# Patient Record
Sex: Female | Born: 1955
Health system: Southern US, Community
[De-identification: ages and names within clinical notes are randomized; demographics above are authoritative.]

## PROBLEM LIST (undated history)

## (undated) DIAGNOSIS — M545 Low back pain, unspecified: Secondary | ICD-10-CM

## (undated) DIAGNOSIS — M1990 Unspecified osteoarthritis, unspecified site: Secondary | ICD-10-CM

## (undated) DIAGNOSIS — I10 Essential (primary) hypertension: Secondary | ICD-10-CM

## (undated) DIAGNOSIS — R519 Headache, unspecified: Secondary | ICD-10-CM

## (undated) DIAGNOSIS — D649 Anemia, unspecified: Secondary | ICD-10-CM

## (undated) DIAGNOSIS — F17200 Nicotine dependence, unspecified, uncomplicated: Secondary | ICD-10-CM

## (undated) DIAGNOSIS — M170 Bilateral primary osteoarthritis of knee: Secondary | ICD-10-CM

## (undated) DIAGNOSIS — E119 Type 2 diabetes mellitus without complications: Secondary | ICD-10-CM

## (undated) DIAGNOSIS — F419 Anxiety disorder, unspecified: Secondary | ICD-10-CM

## (undated) DIAGNOSIS — R51 Headache: Secondary | ICD-10-CM

## (undated) DIAGNOSIS — G709 Myoneural disorder, unspecified: Secondary | ICD-10-CM

## (undated) DIAGNOSIS — K219 Gastro-esophageal reflux disease without esophagitis: Secondary | ICD-10-CM

## (undated) DIAGNOSIS — J45909 Unspecified asthma, uncomplicated: Secondary | ICD-10-CM

## (undated) HISTORY — DX: Essential (primary) hypertension: I10

## (undated) HISTORY — DX: Unspecified asthma, uncomplicated: J45.909

## (undated) HISTORY — PX: TUBAL LIGATION: SHX77

## (undated) HISTORY — PX: HERNIA REPAIR: SHX51

## (undated) HISTORY — DX: Morbid (severe) obesity due to excess calories: E66.01

## (undated) HISTORY — DX: Bilateral primary osteoarthritis of knee: M17.0

## (undated) HISTORY — PX: ABDOMINAL HYSTERECTOMY: SHX81

## (undated) HISTORY — DX: Low back pain, unspecified: M54.50

## (undated) HISTORY — DX: Low back pain: M54.5

## (undated) HISTORY — DX: Nicotine dependence, unspecified, uncomplicated: F17.200

## (undated) HISTORY — PX: CHOLECYSTECTOMY: SHX55

---

## 2009-07-15 ENCOUNTER — Emergency Department (HOSPITAL_COMMUNITY): Admission: EM | Admit: 2009-07-15 | Discharge: 2009-07-15 | Payer: Self-pay | Admitting: Emergency Medicine

## 2009-08-03 ENCOUNTER — Encounter: Admission: RE | Admit: 2009-08-03 | Discharge: 2009-08-03 | Payer: Self-pay | Admitting: Family Medicine

## 2009-08-03 ENCOUNTER — Ambulatory Visit: Payer: Self-pay | Admitting: Family Medicine

## 2009-08-12 ENCOUNTER — Ambulatory Visit: Payer: Self-pay | Admitting: Family Medicine

## 2009-08-17 ENCOUNTER — Ambulatory Visit: Payer: Self-pay | Admitting: Family Medicine

## 2009-09-28 ENCOUNTER — Encounter
Admission: RE | Admit: 2009-09-28 | Discharge: 2009-11-16 | Payer: Self-pay | Source: Home / Self Care | Admitting: Family Medicine

## 2009-11-16 ENCOUNTER — Ambulatory Visit: Payer: Self-pay | Admitting: Family Medicine

## 2009-12-16 ENCOUNTER — Ambulatory Visit: Payer: Self-pay | Admitting: Family Medicine

## 2010-01-16 ENCOUNTER — Ambulatory Visit
Admission: RE | Admit: 2010-01-16 | Discharge: 2010-01-16 | Payer: Self-pay | Source: Home / Self Care | Attending: Family Medicine | Admitting: Family Medicine

## 2010-02-16 ENCOUNTER — Ambulatory Visit (INDEPENDENT_AMBULATORY_CARE_PROVIDER_SITE_OTHER): Payer: BC Managed Care – PPO | Admitting: Family Medicine

## 2010-02-16 DIAGNOSIS — M549 Dorsalgia, unspecified: Secondary | ICD-10-CM

## 2010-02-16 DIAGNOSIS — E669 Obesity, unspecified: Secondary | ICD-10-CM

## 2010-02-16 DIAGNOSIS — I10 Essential (primary) hypertension: Secondary | ICD-10-CM

## 2010-03-18 LAB — DIFFERENTIAL
Basophils Absolute: 0 10*3/uL (ref 0.0–0.1)
Basophils Relative: 0 % (ref 0–1)
Eosinophils Absolute: 0.1 10*3/uL (ref 0.0–0.7)
Eosinophils Relative: 1 % (ref 0–5)
Lymphs Abs: 1.7 10*3/uL (ref 0.7–4.0)
Monocytes Absolute: 0.4 10*3/uL (ref 0.1–1.0)
Monocytes Relative: 4 % (ref 3–12)
Neutro Abs: 7.6 10*3/uL (ref 1.7–7.7)

## 2010-03-18 LAB — TROPONIN I: Troponin I: 0.03 ng/mL (ref 0.00–0.06)

## 2010-03-18 LAB — COMPREHENSIVE METABOLIC PANEL
Calcium: 8.5 mg/dL (ref 8.4–10.5)
Chloride: 103 mEq/L (ref 96–112)
Creatinine, Ser: 0.54 mg/dL (ref 0.4–1.2)
GFR calc Af Amer: >60 mL/min (ref >60)
GFR calc non Af Amer: >60 mL/min (ref >60)
Glucose, Bld: 118 mg/dL — ABNORMAL HIGH (ref 70–99)
Potassium: 3.8 mEq/L (ref 3.5–5.1)
Sodium: 138 mEq/L (ref 135–145)

## 2010-03-18 LAB — CBC
HCT: 40 % (ref 36.0–46.0)
MCH: 29.5 pg (ref 26.0–34.0)
MCHC: 33 g/dL (ref 30.0–36.0)
RBC: 4.48 MIL/uL (ref 3.87–5.11)
WBC: 9.9 10*3/uL (ref 4.0–10.5)

## 2010-03-18 LAB — D-DIMER, QUANTITATIVE: D-Dimer, Quant: 0.84 ug/mL-FEU — ABNORMAL HIGH (ref 0.00–0.48)

## 2010-03-18 LAB — CK TOTAL AND CKMB (NOT AT ARMC)
CK, MB: 3.3 ng/mL (ref 0.3–4.0)
Total CK: 118 U/L (ref 7–177)

## 2010-09-13 ENCOUNTER — Encounter: Payer: Self-pay | Admitting: Family Medicine

## 2011-02-07 ENCOUNTER — Other Ambulatory Visit: Payer: Self-pay | Admitting: Family Medicine

## 2011-02-07 NOTE — Telephone Encounter (Signed)
Patient needs to schedule an office visit before they will receive any more refills. Thank you

## 2011-06-18 ENCOUNTER — Encounter: Payer: Self-pay | Admitting: Family Medicine

## 2011-06-18 ENCOUNTER — Ambulatory Visit (INDEPENDENT_AMBULATORY_CARE_PROVIDER_SITE_OTHER): Payer: BC Managed Care – PPO | Admitting: Family Medicine

## 2011-06-18 VITALS — BP 140/90 | HR 90 | Wt 320.0 lb

## 2011-06-18 DIAGNOSIS — M1610 Unilateral primary osteoarthritis, unspecified hip: Secondary | ICD-10-CM

## 2011-06-18 DIAGNOSIS — Z6841 Body Mass Index (BMI) 40.0 and over, adult: Secondary | ICD-10-CM

## 2011-06-18 NOTE — Progress Notes (Signed)
  Subjective:    Patient ID: Angie Carr, female    DOB: 05/30/1955, 56 y.o.   MRN: [id number]  HPI She is here to discuss the SCAT program for transportation she has a history of arthritis of the hips and knees. She states she cannot walk long distances without pain in both areas and having to rest. She does work but states that she is having difficulty getting to the work week. She is post to work 7 hours but states that she usually can only get a total of 3 in because of having to rest. Review of Systems     Objective:   Physical Exam Alert and in no distress otherwise not examined       Assessment & Plan:  Arthritis. Morbid obesity A form was filled out stating the above issues. We will also refer her to nutritionist to help with weight reduction.

## 2011-07-17 ENCOUNTER — Encounter: Payer: BC Managed Care – PPO | Attending: Family Medicine | Admitting: *Deleted

## 2011-07-17 ENCOUNTER — Encounter: Payer: Self-pay | Admitting: *Deleted

## 2011-07-17 DIAGNOSIS — Z713 Dietary counseling and surveillance: Secondary | ICD-10-CM | POA: Insufficient documentation

## 2011-07-17 DIAGNOSIS — E669 Obesity, unspecified: Secondary | ICD-10-CM | POA: Insufficient documentation

## 2011-07-17 NOTE — Patient Instructions (Addendum)
Take break at work and eat dinner/lunch Aim to drink water throughout day- mix with lemons or Splenda or Truvia/Stevia for flavor Try 1% milk Try wheat bread for toast and sandwiches Rinse canned vegetables Try to get to [employer] on [address] on Wednesday or Saturday Choose more fruits and vegetables- make half plate vegetables Keep meat and starches portion small

## 2011-07-17 NOTE — Progress Notes (Signed)
  Medical Nutrition Therapy:  Appt start time: 1015 end time:  1115.   Assessment:  Primary concerns today: obesity. And arthritis exacerbated by obesity  MEDICATIONS: none currently.     DIETARY INTAKE:  Usual eating pattern includes 3 meals and none snacks per day.  Everyday foods include meats and starches.  Avoided foods include none.    24-hr dietary recall: B (AM):  10 am has sandwich ham and [place]. Drinks coffee with sugar and creamer Snk (AM):  none L (PM):  Grilled pork chop with sliced tomatoes or grilled chicken with sliced rtomoato Snk (PM):  none D (PM):  Oodles of noodles Snk (HS):  Ice cream Beverages: Diet coke  Usual physical activity: none  Estimated energy needs: 1400 calories 158 g carbohydrates 105 g protein 39 g fat  Progress Towards Goal(s):  Some progress.   Nutritional Diagnosis:  [place]-3.3 Overweight/obesity As related to history of consuming fatty fried foods combined with physcial inactivity.  As evidenced by BMI of 53.2.    Intervention:  Nutrition counseling provided.  Patient is here for weight loss counseling.  She has high blood pressure, but has not been able to afford her medications of late, and now her prescription has expired.  She does not take any of her previously prescribed medications, including metformin.  She has degenerative arthritis and has been encouraged to loose weight.  She has made some changes to her diet by cutting back on fried foods and choosing more grilled or broiled options.  She is not able to be very physically active due to physical limitations and her work schedule has been forcing her to eat late at night, non-nutritious options.  Encouraged her to pack a meal to eat at work on her break so she wouldn't have to eat as late.  Encouraged more vegetables.  Discussed cost-effective options like farmer's markets.  Gave handout on chair exercises to do at home.  Also encouraged drinking more water throughout the day.   Encouraged 1% milk and whole wheat bread  Handouts given during visit include:  Chair exercises  MyPlate  Monitoring/Evaluation:  Dietary intake, exercise, and body weight prn.  Will call to make follow-up, if needed.

## 2011-08-25 ENCOUNTER — Telehealth: Payer: Self-pay | Admitting: Family Medicine

## 2011-08-25 NOTE — Telephone Encounter (Signed)
LM

## 2011-09-11 DIAGNOSIS — Z0271 Encounter for disability determination: Secondary | ICD-10-CM

## 2011-10-15 ENCOUNTER — Other Ambulatory Visit: Payer: Self-pay

## 2011-10-15 MED ORDER — LISINOPRIL-HYDROCHLOROTHIAZIDE 20-12.5 MG PO TABS: 1.0000 | ORAL_TABLET | Freq: Every day | ORAL | Status: DC

## 2011-10-15 MED ORDER — TRAMADOL HCL 50 MG PO TABS: 50.0000 mg | ORAL_TABLET | Freq: Four times a day (QID) | ORAL | Status: DC | PRN

## 2011-10-15 MED ORDER — METFORMIN HCL 500 MG PO TABS: 500.0000 mg | ORAL_TABLET | Freq: Two times a day (BID) | ORAL | Status: DC

## 2011-10-15 MED ORDER — AMLODIPINE BESYLATE 10 MG PO TABS: 10.0000 mg | ORAL_TABLET | Freq: Every day | ORAL | Status: DC

## 2011-10-15 NOTE — Telephone Encounter (Signed)
Pt has a med check appt thurs sent in only 30 days

## 2011-10-15 NOTE — Telephone Encounter (Signed)
PT CALLED LEFT ME A MESSAGE STATED SHE IS ON MEDICAID NOW AND WANTED HER MEDS REFILLED AND WANTED TO MAKE SURE SHE WOULD STILL BE YOUR PT SHE WAS LAST SEEN 06/2011 BY YOU PLEASE ADVISE

## 2011-10-15 NOTE — Telephone Encounter (Signed)
Have her set up a followup appointment but renew her meds until she is seen

## 2011-10-18 ENCOUNTER — Telehealth: Payer: Self-pay | Admitting: Internal Medicine

## 2011-10-18 ENCOUNTER — Other Ambulatory Visit: Payer: Self-pay

## 2011-10-18 ENCOUNTER — Encounter: Payer: Self-pay | Admitting: Internal Medicine

## 2011-10-18 ENCOUNTER — Ambulatory Visit (INDEPENDENT_AMBULATORY_CARE_PROVIDER_SITE_OTHER): Payer: Medicaid Other | Admitting: Family Medicine

## 2011-10-18 ENCOUNTER — Encounter: Payer: Self-pay | Admitting: Family Medicine

## 2011-10-18 VITALS — BP 144/90 | HR 90 | Ht 65.0 in | Wt 322.0 lb

## 2011-10-18 DIAGNOSIS — I152 Hypertension secondary to endocrine disorders: Secondary | ICD-10-CM

## 2011-10-18 DIAGNOSIS — E1159 Type 2 diabetes mellitus with other circulatory complications: Secondary | ICD-10-CM

## 2011-10-18 DIAGNOSIS — I10 Essential (primary) hypertension: Secondary | ICD-10-CM

## 2011-10-18 DIAGNOSIS — E119 Type 2 diabetes mellitus without complications: Secondary | ICD-10-CM

## 2011-10-18 DIAGNOSIS — E1169 Type 2 diabetes mellitus with other specified complication: Secondary | ICD-10-CM

## 2011-10-18 DIAGNOSIS — K219 Gastro-esophageal reflux disease without esophagitis: Secondary | ICD-10-CM

## 2011-10-18 LAB — LIPID PANEL
HDL: 36 mg/dL — ABNORMAL LOW (ref >39)
VLDL: 27 mg/dL (ref 0–40)

## 2011-10-18 LAB — COMPREHENSIVE METABOLIC PANEL
ALT: 13 U/L (ref 0–35)
Alkaline Phosphatase: 98 U/L (ref 39–117)
CO2: 28 mEq/L (ref 19–32)
Total Bilirubin: 0.5 mg/dL (ref 0.3–1.2)

## 2011-10-18 LAB — CBC WITH DIFFERENTIAL/PLATELET
Basophils Absolute: 0 10*3/uL (ref 0.0–0.1)
Eosinophils Absolute: 0.1 10*3/uL (ref 0.0–0.7)
Hemoglobin: 13.3 g/dL (ref 12.0–15.0)
Lymphs Abs: 2.1 10*3/uL (ref 0.7–4.0)
MCHC: 32.5 g/dL (ref 30.0–36.0)
Monocytes Absolute: 0.5 10*3/uL (ref 0.1–1.0)
Neutro Abs: 5.4 10*3/uL (ref 1.7–7.7)
Neutrophils Relative %: 66 % (ref 43–77)
Platelets: 180 10*3/uL (ref 150–400)
RDW: 15.6 % — ABNORMAL HIGH (ref 11.5–15.5)

## 2011-10-18 MED ORDER — AMLODIPINE BESYLATE 10 MG PO TABS: 10.0000 mg | ORAL_TABLET | Freq: Every day | ORAL | Status: DC

## 2011-10-18 MED ORDER — FREESTYLE LANCETS MISC: 1.0000 | Freq: Two times a day (BID) | Status: DC

## 2011-10-18 MED ORDER — METFORMIN HCL 500 MG PO TABS: 500.0000 mg | ORAL_TABLET | Freq: Two times a day (BID) | ORAL | Status: DC

## 2011-10-18 MED ORDER — OMEPRAZOLE 20 MG PO CPDR: 20.0000 mg | DELAYED_RELEASE_CAPSULE | Freq: Every day | ORAL | Status: DC

## 2011-10-18 MED ORDER — LANCETS MISC: Status: DC

## 2011-10-18 MED ORDER — LISINOPRIL-HYDROCHLOROTHIAZIDE 20-12.5 MG PO TABS: 1.0000 | ORAL_TABLET | Freq: Two times a day (BID) | ORAL | Status: DC

## 2011-10-18 MED ORDER — GLUCOSE BLOOD VI STRP: 1.0000 | ORAL_STRIP | Freq: Two times a day (BID) | Status: DC

## 2011-10-18 MED ORDER — ACCU-CHEK AVIVA PLUS W/DEVICE KIT: 1.0000 | PACK | Status: DC

## 2011-10-18 NOTE — Patient Instructions (Signed)
Try some Aleve for your pain. You can take 2 of them twice a day if you need to.

## 2011-10-18 NOTE — Progress Notes (Signed)
  Subjective:    Patient ID: Angie Carr, female    DOB: May 01, 1955, 56 y.o.   MRN: [id number]  HPI He is here for recheck. She's been off her medications since January. She stop it we are going to call these medicines in. She did not call to discuss this. She now is on disability due to her back, knees and hips. She has been to the nutritionist and states she has made some dietary changes. She has tried tramadol for her pain but states it makes her go to sleep. She has tried [facility] powders and finds it useful. She has cut back on her Tylenol dosing. She walks for 10 minutes every   Review of Systems     Objective:   Physical Exam Alert and in no distress. Hemoglobin A1c is 7.3.       Assessment & Plan:   1. Diabetes mellitus  POCT glycosylated hemoglobin (Hb A1C), metFORMIN (GLUCOPHAGE) 500 MG tablet, Amb ref to Medical Nutrition Therapy-MNT, Lipid Panel, CBC with Differential, Comprehensive metabolic panel  2. GERD (gastroesophageal reflux disease)  omeprazole (PRILOSEC) 20 MG capsule  3. Hypertension associated with diabetes  lisinopril-hydrochlorothiazide (PRINZIDE,ZESTORETIC) 20-12.5 MG per tablet, amLODipine (NORVASC) 10 MG tablet  4. Morbid obesity  Lipid Panel, CBC with Differential, Comprehensive metabolic panel   encouraged her to make further dietary changes and also increase her physical activity to a half an hour every day. We will educate her concerning her diabetes and he try to use a glucometer. She did ask about water aerobics. I told her to definitely take it and so that the insurance would not necessarily cover this. Explained that this would be good use of her money. Also recommended an anti-inflammatory for her various aches and pains

## 2011-10-18 NOTE — Telephone Encounter (Signed)
sent in new meter and strips along with lancets

## 2011-10-19 NOTE — Progress Notes (Signed)
Quick Note:  PT CALLED AND ADVISED OF LABS SHE VERBALIZED UNDERSTANDING  ______

## 2011-10-19 NOTE — Progress Notes (Signed)
Quick Note:  Labs look good continue on present meds ______

## 2011-10-29 ENCOUNTER — Ambulatory Visit (INDEPENDENT_AMBULATORY_CARE_PROVIDER_SITE_OTHER): Payer: Medicaid Other | Admitting: Family Medicine

## 2011-10-29 ENCOUNTER — Encounter: Payer: Self-pay | Admitting: Family Medicine

## 2011-10-29 DIAGNOSIS — IMO0001 Reserved for inherently not codable concepts without codable children: Secondary | ICD-10-CM

## 2011-10-29 DIAGNOSIS — Z049 Encounter for examination and observation for unspecified reason: Secondary | ICD-10-CM

## 2011-10-29 NOTE — Progress Notes (Signed)
  Subjective:    Patient ID: Angie Carr, female    DOB: 1955/09/01, 56 y.o.   MRN: [id number]  HPI She is here for consultation. She brought paperwork to be filled out concerning getting personal care for household chores. I reviewed her medical history and explained that insurance would not pay for to help with household chores but only with personal care issues relating to her medical needs.   Review of Systems     Objective:   Physical Exam Alert and in no distress otherwise not examined       Assessment & Plan:   1. No diagnostic abnormality    recommend she continue to take all her medications and return her had her regularly scheduled time.

## 2011-11-14 ENCOUNTER — Ambulatory Visit: Payer: Medicaid Other | Admitting: Dietician

## 2012-02-14 ENCOUNTER — Ambulatory Visit: Payer: Medicaid Other | Admitting: Family Medicine

## 2012-03-06 ENCOUNTER — Ambulatory Visit: Payer: Medicaid Other | Admitting: Family Medicine

## 2012-03-18 ENCOUNTER — Encounter: Payer: Self-pay | Admitting: Family Medicine

## 2012-03-18 ENCOUNTER — Ambulatory Visit (INDEPENDENT_AMBULATORY_CARE_PROVIDER_SITE_OTHER): Payer: Self-pay | Admitting: Family Medicine

## 2012-03-18 VITALS — BP 138/80 | HR 102 | Wt 320.0 lb

## 2012-03-18 DIAGNOSIS — I10 Essential (primary) hypertension: Secondary | ICD-10-CM

## 2012-03-18 DIAGNOSIS — E119 Type 2 diabetes mellitus without complications: Secondary | ICD-10-CM | POA: Insufficient documentation

## 2012-03-18 DIAGNOSIS — I152 Hypertension secondary to endocrine disorders: Secondary | ICD-10-CM | POA: Insufficient documentation

## 2012-03-18 DIAGNOSIS — M129 Arthropathy, unspecified: Secondary | ICD-10-CM

## 2012-03-18 DIAGNOSIS — E785 Hyperlipidemia, unspecified: Secondary | ICD-10-CM | POA: Insufficient documentation

## 2012-03-18 DIAGNOSIS — E782 Mixed hyperlipidemia: Secondary | ICD-10-CM | POA: Insufficient documentation

## 2012-03-18 DIAGNOSIS — E1169 Type 2 diabetes mellitus with other specified complication: Secondary | ICD-10-CM

## 2012-03-18 DIAGNOSIS — E1159 Type 2 diabetes mellitus with other circulatory complications: Secondary | ICD-10-CM | POA: Insufficient documentation

## 2012-03-18 DIAGNOSIS — M1990 Unspecified osteoarthritis, unspecified site: Secondary | ICD-10-CM

## 2012-03-18 MED ORDER — PRAVASTATIN SODIUM 20 MG PO TABS: 20.0000 mg | ORAL_TABLET | Freq: Every day | ORAL | Status: DC

## 2012-03-18 NOTE — Progress Notes (Signed)
Eye exam:2013 Foot exam: 10-18-11 Test 1 time a day B/s run: 150

## 2012-03-18 NOTE — Progress Notes (Signed)
  Subjective:    Patient ID: Angie Carr, female    DOB: [birth year], 57 y.o.   MRN: [id number]  HPI She is here for a followup visit.she continues on medications listed in the chart. Walking and drinking were reviewed. Her physical activities are limited due to her knee pain. She states that she walks roughly 10 minutes several times per week. She states that she has been through nutritional counseling. Presently she is not on a statin drug. She states her blood sugars run between 149 and 169. She does have reflux symptoms but these are not on a daily basis. She also complains of left shoulder pain and sometimes wakes her up at night. Presently she does not have insurance.   Review of Systems     Objective:   Physical Exam Alert and in no distress. Hemoglobin A1c is 7.4. Left shoulder exam does show limitation of range of motion and pain with motion in all directions. No laxity is noted.      Assessment & Plan:  Type II or unspecified type diabetes mellitus without mention of complication, not stated as uncontrolled - Plan: POCT glycosylated hemoglobin (Hb A1C)  Morbid obesity  Hypertension associated with diabetes  Hyperlipidemia LDL goal <70 - Plan: pravastatin (PRAVACHOL) 20 MG tablet  Arthritis I encouraged her to increase her physical activity to 20 or 30 minutes per day but break it up into 10 minute increments. Discussed dietary modification especially in regard to eating more vegetables and cutting back on carbs.discussed x-rays as well as further health maintenance issues with her. She would like  to get on an insurance plan before we do any these. Recommend she use her Prilosec as needed.

## 2012-03-18 NOTE — Patient Instructions (Addendum)
Use the Prilosec as needed rather than on a daily basis. Base your use on when you have symptoms. You can take as many as 4 ibuprofen 3 times per day.try that as well as Tylenol for your pain and if it works continue to use it and if it doesn't let me know. Start taking a cholesterol medicine but if you have aches and pains with this medicine let me know

## 2012-05-01 ENCOUNTER — Telehealth: Payer: Self-pay | Admitting: Family Medicine

## 2012-05-01 DIAGNOSIS — I10 Essential (primary) hypertension: Secondary | ICD-10-CM

## 2012-05-01 DIAGNOSIS — I152 Hypertension secondary to endocrine disorders: Secondary | ICD-10-CM

## 2012-05-01 MED ORDER — LISINOPRIL-HYDROCHLOROTHIAZIDE 20-12.5 MG PO TABS: 1.0000 | ORAL_TABLET | Freq: Two times a day (BID) | ORAL | Status: DC

## 2012-05-01 NOTE — Telephone Encounter (Signed)
SENT IN B/P MED  

## 2012-06-02 ENCOUNTER — Other Ambulatory Visit: Payer: Self-pay | Admitting: Family Medicine

## 2012-06-02 ENCOUNTER — Telehealth: Payer: Self-pay | Admitting: Family Medicine

## 2012-06-02 NOTE — Telephone Encounter (Signed)
LOOKS LIKE [facility] HAS ALREADY SENT IN

## 2012-06-02 NOTE — Telephone Encounter (Signed)
Pt has cpe on 7/15 but needs refill on Metformin to [facility] on [address].

## 2012-06-05 ENCOUNTER — Telehealth: Payer: Self-pay | Admitting: Internal Medicine

## 2012-06-05 DIAGNOSIS — E785 Hyperlipidemia, unspecified: Secondary | ICD-10-CM

## 2012-06-05 DIAGNOSIS — K219 Gastro-esophageal reflux disease without esophagitis: Secondary | ICD-10-CM

## 2012-06-05 DIAGNOSIS — I152 Hypertension secondary to endocrine disorders: Secondary | ICD-10-CM

## 2012-06-05 DIAGNOSIS — I10 Essential (primary) hypertension: Secondary | ICD-10-CM

## 2012-06-05 MED ORDER — LISINOPRIL-HYDROCHLOROTHIAZIDE 20-12.5 MG PO TABS: 1.0000 | ORAL_TABLET | Freq: Two times a day (BID) | ORAL | Status: DC

## 2012-06-05 MED ORDER — AMLODIPINE BESYLATE 10 MG PO TABS: 10.0000 mg | ORAL_TABLET | Freq: Every day | ORAL | Status: DC

## 2012-06-05 MED ORDER — METFORMIN HCL 500 MG PO TABS: ORAL_TABLET | ORAL | Status: DC

## 2012-06-05 MED ORDER — PRAVASTATIN SODIUM 20 MG PO TABS
20.0000 mg | ORAL_TABLET | Freq: Every day | ORAL | Status: DC
Start: 2012-06-05 — End: 2013-05-01

## 2012-06-05 MED ORDER — OMEPRAZOLE 20 MG PO CPDR: 20.0000 mg | DELAYED_RELEASE_CAPSULE | Freq: Every day | ORAL | Status: DC

## 2012-06-05 NOTE — Telephone Encounter (Signed)
SENT MEDS IN TO NEW PHARMACY

## 2012-06-05 NOTE — Telephone Encounter (Signed)
Pt got a new pharmacy. Please send lisinopril-hctz 20-12.5mg  #90,amlodipine 10mg  #90,omeprazole 20mg  #90, pravastatin 20mg  #90, metformin 500mg  #90 to [facility]

## 2012-07-15 ENCOUNTER — Ambulatory Visit (INDEPENDENT_AMBULATORY_CARE_PROVIDER_SITE_OTHER): Payer: No Typology Code available for payment source | Admitting: Family Medicine

## 2012-07-15 ENCOUNTER — Encounter: Payer: Self-pay | Admitting: Family Medicine

## 2012-07-15 VITALS — BP 124/80 | HR 97 | Ht 65.0 in | Wt 310.0 lb

## 2012-07-15 DIAGNOSIS — E785 Hyperlipidemia, unspecified: Secondary | ICD-10-CM

## 2012-07-15 DIAGNOSIS — M1990 Unspecified osteoarthritis, unspecified site: Secondary | ICD-10-CM

## 2012-07-15 DIAGNOSIS — I10 Essential (primary) hypertension: Secondary | ICD-10-CM

## 2012-07-15 DIAGNOSIS — E1169 Type 2 diabetes mellitus with other specified complication: Secondary | ICD-10-CM

## 2012-07-15 DIAGNOSIS — E119 Type 2 diabetes mellitus without complications: Secondary | ICD-10-CM

## 2012-07-15 DIAGNOSIS — E1159 Type 2 diabetes mellitus with other circulatory complications: Secondary | ICD-10-CM

## 2012-07-15 DIAGNOSIS — I152 Hypertension secondary to endocrine disorders: Secondary | ICD-10-CM

## 2012-07-15 DIAGNOSIS — M129 Arthropathy, unspecified: Secondary | ICD-10-CM

## 2012-07-15 LAB — POCT GLYCOSYLATED HEMOGLOBIN (HGB A1C): Hemoglobin A1C: 7.8

## 2012-07-15 MED ORDER — METFORMIN HCL 1000 MG PO TABS: 1000.0000 mg | ORAL_TABLET | Freq: Two times a day (BID) | ORAL | Status: DC

## 2012-07-15 MED ORDER — ETODOLAC 500 MG PO TABS: 500.0000 mg | ORAL_TABLET | Freq: Two times a day (BID) | ORAL | Status: DC

## 2012-07-15 NOTE — Progress Notes (Signed)
Subjective:    Angie Carr is a 57 y.o. female who presents for follow-up of Type 2 diabetes mellitus.    Home blood sugar records: HIGH 219 LOW 113  Current symptoms/FREQUENT URINE AT NIGHT Daily foot checks, foot concerns: NO/NO Last eye exam: 2013    Medication compliance: Good Current diet: POPCORN MEATS SALADS Current exercise NONE: she states that her knees hurt her too much. She is on disability apparently from knees and back although it's difficult to get a good history concerning this. She did state that she use some Lodine which did help with her pain.  Known diabetic complications: none Cardiovascular risk factors: advanced age (older than 75 for men, 60 for women), diabetes mellitus, dyslipidemia, hypertension, obesity (BMI >= 30 kg/m2) and sedentary lifestyle She continues to smoke and is not interested in quitting.  The following portions of the patient's history were reviewed and updated as appropriate: allergies, current medications, past family history, past medical history, past social history, past surgical history and problem list.  ROS as in subjective above    Objective:    BP 124/80  Pulse 97  Ht 5\' 5"  (1.651 m)  Wt 310 lb (140.615 kg)  BMI 51.59 kg/m2  SpO2 97%  Filed Vitals:   07/15/12 1055  BP: 124/80  Pulse: 97    General appearence: alert, no distress, WD/WN Neck: supple, no lymphadenopathy, no thyromegaly, no masses Heart: RRR, normal S1, S2, no murmurs Lungs: CTA bilaterally, no wheezes, rhonchi, or rales Abdomen: +bs, soft, non tender, non distended, no masses, no hepatomegaly, no splenomegaly Pulses: 2+ symmetric, upper and lower extremities, normal cap refill Ext: no edema Foot exam:  Neuro: foot monofilament exam normal   Lab Review Lab Results  Component Value Date   HGBA1C 7.4 03/18/2012   Lab Results  Component Value Date   CHOL 162 10/18/2011   HDL 36* 10/18/2011   LDLCALC 99 10/18/2011   TRIG 134 10/18/2011    CHOLHDL 4.5 10/18/2011   No results found for this basenameConcepcion Elk     Chemistry      Component Value Date/Time   NA 140 10/18/2011 1146   K 4.7 10/18/2011 1146   CL 99 10/18/2011 1146   CO2 28 10/18/2011 1146   BUN 11 10/18/2011 1146   CREATININE 0.72 10/18/2011 1146   CREATININE 0.54 07/15/2009 1215      Component Value Date/Time   CALCIUM 9.4 10/18/2011 1146   ALKPHOS 98 10/18/2011 1146   AST 13 10/18/2011 1146   ALT 13 10/18/2011 1146   BILITOT 0.5 10/18/2011 1146        Chemistry      Component Value Date/Time   NA 140 10/18/2011 1146   K 4.7 10/18/2011 1146   CL 99 10/18/2011 1146   CO2 28 10/18/2011 1146   BUN 11 10/18/2011 1146   CREATININE 0.72 10/18/2011 1146   CREATININE 0.54 07/15/2009 1215      Component Value Date/Time   CALCIUM 9.4 10/18/2011 1146   ALKPHOS 98 10/18/2011 1146   AST 13 10/18/2011 1146   ALT 13 10/18/2011 1146   BILITOT 0.5 10/18/2011 1146     A1c 7.8  Last optometry/ophthalmology exam reviewed from:    Assessment:  Type II or unspecified type diabetes mellitus without mention of complication, not stated as uncontrolled - Plan: POCT glycosylated hemoglobin (Hb A1C), metFORMIN (GLUCOPHAGE) 1000 MG tablet  Arthritis - Plan: etodolac (LODINE) 500 MG tablet  Hyperlipidemia LDL goal <70  Hypertension  associated with diabetes  Morbid obesity  I will give her Lodine since this apparently did help. Also strongly encouraged her to continue with her weight loss which so far has been 10 pounds. Encouraged her to walk even if this is for 5 minutes at a time but do this several times per day to build up her strength and stamina and to help with the joint discomfort.       Plan:    1.  Rx changes: Increase metformin to thousand milligrams twice a day  2.  Education: Reviewed 'ABCs' of diabetes management (respective goals in parentheses):  A1C (<7), blood pressure (<130/80), and cholesterol (LDL <100). 3.  Compliance  at present is estimated to be poor. Efforts to improve compliance (if necessary) will be directed at increased exercise. 4. Follow up: 4 months  Discussed mammogram, colonoscopy, Pap and immunizations. She is not sure whether her insurance will cover this. I encouraged her to call the insurance currently to find out about the coverage and let us know. We will order appropriate tests.

## 2012-07-15 NOTE — Patient Instructions (Addendum)
Check on your insurance coverage on coverage for mammogram and colonoscopy as well as immunizations. Take 2 metformin twice a day until this prescription is gone and then start the new one which will be one pill twice a day. Walk for 5 minutes at a time but do that 4 or 5 times a day

## 2012-08-04 ENCOUNTER — Telehealth: Payer: Self-pay | Admitting: Family Medicine

## 2012-08-04 NOTE — Telephone Encounter (Signed)
LM

## 2012-10-13 ENCOUNTER — Telehealth: Payer: Self-pay | Admitting: Family Medicine

## 2012-10-13 NOTE — Telephone Encounter (Signed)
Returned call to [employer] [place] [employer] regarding SKAT Part B Recertification, reached her voice mail left message.

## 2012-10-14 ENCOUNTER — Telehealth: Payer: Self-pay | Admitting: Family Medicine

## 2012-10-14 NOTE — Telephone Encounter (Signed)
Please take care of this.  

## 2012-10-16 ENCOUNTER — Telehealth: Payer: Self-pay | Admitting: Family Medicine

## 2012-10-16 NOTE — Telephone Encounter (Signed)
Angie Carr with [employer] called to ask if pt knees and back were what she needed SKAT for and I advised yes.  That was also documented on the forms we filled out.

## 2012-10-16 NOTE — Telephone Encounter (Signed)
[place] with [name] of [employer] today and took care of this.

## 2012-11-04 ENCOUNTER — Ambulatory Visit (INDEPENDENT_AMBULATORY_CARE_PROVIDER_SITE_OTHER): Payer: No Typology Code available for payment source | Admitting: Family Medicine

## 2012-11-04 ENCOUNTER — Encounter: Payer: Self-pay | Admitting: Family Medicine

## 2012-11-04 VITALS — BP 140/84 | HR 68 | Wt 304.0 lb

## 2012-11-04 DIAGNOSIS — I152 Hypertension secondary to endocrine disorders: Secondary | ICD-10-CM

## 2012-11-04 DIAGNOSIS — Z720 Tobacco use: Secondary | ICD-10-CM | POA: Insufficient documentation

## 2012-11-04 DIAGNOSIS — M129 Arthropathy, unspecified: Secondary | ICD-10-CM

## 2012-11-04 DIAGNOSIS — E785 Hyperlipidemia, unspecified: Secondary | ICD-10-CM

## 2012-11-04 DIAGNOSIS — Z23 Encounter for immunization: Secondary | ICD-10-CM

## 2012-11-04 DIAGNOSIS — E119 Type 2 diabetes mellitus without complications: Secondary | ICD-10-CM

## 2012-11-04 DIAGNOSIS — F17200 Nicotine dependence, unspecified, uncomplicated: Secondary | ICD-10-CM

## 2012-11-04 DIAGNOSIS — E1169 Type 2 diabetes mellitus with other specified complication: Secondary | ICD-10-CM

## 2012-11-04 DIAGNOSIS — I10 Essential (primary) hypertension: Secondary | ICD-10-CM

## 2012-11-04 DIAGNOSIS — M1990 Unspecified osteoarthritis, unspecified site: Secondary | ICD-10-CM

## 2012-11-04 DIAGNOSIS — E1159 Type 2 diabetes mellitus with other circulatory complications: Secondary | ICD-10-CM

## 2012-11-04 LAB — COMPREHENSIVE METABOLIC PANEL
Albumin: 4.2 g/dL (ref 3.5–5.2)
CO2: 30 mEq/L (ref 19–32)
Calcium: 9.8 mg/dL (ref 8.4–10.5)
Glucose, Bld: 135 mg/dL — ABNORMAL HIGH (ref 70–99)
Sodium: 141 mEq/L (ref 135–145)
Total Bilirubin: 0.5 mg/dL (ref 0.3–1.2)
Total Protein: 7.2 g/dL (ref 6.0–8.3)

## 2012-11-04 LAB — CBC WITH DIFFERENTIAL/PLATELET
Eosinophils Absolute: 0.1 10*3/uL (ref 0.0–0.7)
Hemoglobin: 12.9 g/dL (ref 12.0–15.0)
Lymphs Abs: 2.4 10*3/uL (ref 0.7–4.0)
MCH: 27.5 pg (ref 26.0–34.0)
MCV: 86.1 fL (ref 78.0–100.0)
Monocytes Relative: 5 % (ref 3–12)
Neutrophils Relative %: 65 % (ref 43–77)
RBC: 4.69 MIL/uL (ref 3.87–5.11)

## 2012-11-04 LAB — LIPID PANEL
Cholesterol: 141 mg/dL (ref 0–200)
Triglycerides: 193 mg/dL — ABNORMAL HIGH (ref <150)
VLDL: 39 mg/dL (ref 0–40)

## 2012-11-04 NOTE — Progress Notes (Signed)
  Subjective:    Patient ID: Angie Carr, female    DOB: 1955-07-02, 57 y.o.   MRN: [id number]  HPI She is here for a diabetes recheck. She is slightly more active walking from her apartment to the elevators and back twice per day which takes roughly 6 minutes. When she started doing this it was taking her much longer. She also has made some dietary changes and has lost a few pounds. She is smoking and slowly cutting back on this. She does check her blood sugars intermittently. She continues on medications listed in the chart. Taking her feet is difficult.   Review of Systems     Objective:   Physical Exam Hemoglobin A1c 7.1. Diabetic  foot exam completed.       Assessment & Plan:  Type II or unspecified type diabetes mellitus without mention of complication, not stated as uncontrolled - Plan: POCT glycosylated hemoglobin (Hb A1C), CBC with Differential, Comprehensive metabolic panel, Lipid panel, POCT UA - Microalbumin  Current occasional smoker  Morbid obesity - Plan: CBC with Differential, Comprehensive metabolic panel, Lipid panel  Hypertension associated with diabetes  Hyperlipidemia LDL goal <70  Arthritis  Need for prophylactic vaccination and inoculation against influenza - Plan: Flu Vaccine QUAD 36+ mos IM, Pneumococcal polysaccharide vaccine 23-valent greater than or equal to 2yo subcutaneous/IM  encouraged her to continue to work on quitting smoking. Also encouraged her to keep increasing her physical activity. Made a goal of her being able to walk to and from the grocery store. Continue with her weight loss program.

## 2012-11-04 NOTE — Patient Instructions (Signed)
Double the time on the walking that you do and eventually I want you to be a will to walk to the store and back. Then when you do that I want you to do it twice

## 2012-12-08 ENCOUNTER — Telehealth: Payer: Self-pay | Admitting: Internal Medicine

## 2012-12-08 ENCOUNTER — Other Ambulatory Visit: Payer: Self-pay

## 2012-12-08 DIAGNOSIS — M1990 Unspecified osteoarthritis, unspecified site: Secondary | ICD-10-CM

## 2012-12-08 DIAGNOSIS — E119 Type 2 diabetes mellitus without complications: Secondary | ICD-10-CM

## 2012-12-08 MED ORDER — ETODOLAC 500 MG PO TABS: 500.0000 mg | ORAL_TABLET | Freq: Two times a day (BID) | ORAL | Status: DC

## 2012-12-08 MED ORDER — METFORMIN HCL 1000 MG PO TABS: 1000.0000 mg | ORAL_TABLET | Freq: Two times a day (BID) | ORAL | Status: DC

## 2012-12-08 NOTE — Telephone Encounter (Signed)
Pt [facility] a [facility]

## 2012-12-08 NOTE — Telephone Encounter (Signed)
SENT MEDS IN  

## 2012-12-08 NOTE — Telephone Encounter (Signed)
DONE

## 2012-12-09 ENCOUNTER — Telehealth: Payer: Self-pay | Admitting: Internal Medicine

## 2012-12-09 DIAGNOSIS — M1990 Unspecified osteoarthritis, unspecified site: Secondary | ICD-10-CM

## 2012-12-09 MED ORDER — ETODOLAC 500 MG PO TABS: 500.0000 mg | ORAL_TABLET | Freq: Two times a day (BID) | ORAL | Status: DC

## 2012-12-09 NOTE — Telephone Encounter (Signed)
Pt called stating she did not have enough money on her card to get a 90 day supply. She wanted 30 day supply sent in with 2 refills to make it 90 day on that rx. i told her i wasn't sure if it would be able to go as a 30 day due to it being a mail order.

## 2013-04-29 ENCOUNTER — Other Ambulatory Visit: Payer: Self-pay | Admitting: Family Medicine

## 2013-05-01 ENCOUNTER — Other Ambulatory Visit: Payer: Self-pay | Admitting: Family Medicine

## 2013-05-12 ENCOUNTER — Encounter: Payer: Self-pay | Admitting: Family Medicine

## 2013-05-12 ENCOUNTER — Ambulatory Visit (INDEPENDENT_AMBULATORY_CARE_PROVIDER_SITE_OTHER): Payer: No Typology Code available for payment source | Admitting: Family Medicine

## 2013-05-12 VITALS — BP 126/80 | HR 74 | Wt 290.0 lb

## 2013-05-12 DIAGNOSIS — E1159 Type 2 diabetes mellitus with other circulatory complications: Secondary | ICD-10-CM

## 2013-05-12 DIAGNOSIS — M129 Arthropathy, unspecified: Secondary | ICD-10-CM

## 2013-05-12 DIAGNOSIS — E785 Hyperlipidemia, unspecified: Secondary | ICD-10-CM

## 2013-05-12 DIAGNOSIS — F17200 Nicotine dependence, unspecified, uncomplicated: Secondary | ICD-10-CM

## 2013-05-12 DIAGNOSIS — E1169 Type 2 diabetes mellitus with other specified complication: Secondary | ICD-10-CM

## 2013-05-12 DIAGNOSIS — I152 Hypertension secondary to endocrine disorders: Secondary | ICD-10-CM

## 2013-05-12 DIAGNOSIS — Z720 Tobacco use: Secondary | ICD-10-CM

## 2013-05-12 DIAGNOSIS — I10 Essential (primary) hypertension: Secondary | ICD-10-CM

## 2013-05-12 DIAGNOSIS — M1990 Unspecified osteoarthritis, unspecified site: Secondary | ICD-10-CM

## 2013-05-12 DIAGNOSIS — E119 Type 2 diabetes mellitus without complications: Secondary | ICD-10-CM

## 2013-05-12 LAB — POCT GLYCOSYLATED HEMOGLOBIN (HGB A1C): Hemoglobin A1C: 7

## 2013-05-12 MED ORDER — ETODOLAC 500 MG PO TABS: 500.0000 mg | ORAL_TABLET | Freq: Two times a day (BID) | ORAL | Status: DC

## 2013-05-12 NOTE — Progress Notes (Signed)
   Subjective:    Patient ID: Georg RuddleGladys M Tholl, female    DOB: 08/08/1955, 58 y.o.   MRN: [id number]  Ms. Renata CapriceGladys M Ephriam KnucklesChristian is a very pleasant 58 y.o. yo female who  has a past medical history of Hypertension; Morbid obesity; LBP (low back pain); Primary osteoarthritis of both knees; Smoker; and Asthma. She presents today for a four month diabetes follow up.  HPI  The patient is doing well overall and has no acute complaints today. The patient checks her blood sugars twice a week normally first thing in the morning. They are generally between 136-142.   In terms of diet, the patient reports that she has cut back a lot and he also reports walking everyday, maybe 10 mins a day.  290-->304 She does have arthritis mainly in her knees and does use Lodine. The patient smokes 1/3ppd, and drinks alcohol never, she is a recovering alcoholic. She is not interested in cutting back at this time. The patient reports no new lesions, burning or tingling in his feet or legs, and had her most recent eye check in 2013.   Review of Systems is negative except per HPI.    Objective:   Physical Exam  Constitutional: Patient is oriented to person, place, and time and well-developed, well-nourished, and in no distress. Eyes: Conjunctivae and EOM are normal. Pupils are equal, round, and reactive to light.  Cardiovascular: Normal rate, regular rhythm and intact distal pulses. Pulmonary/Chest: Effort normal and breath sounds normal.  Neurological: Vibratory, positional and monofilament sensation normal in feet bilaterally.  HgA1c: 7.0   Assessment & Plan:  Type II or unspecified type diabetes mellitus without mention of complication, not stated as uncontrolled  Morbid obesity  Hypertension associated with diabetes  Hyperlipidemia LDL goal <70  Current occasional smoker  Arthritis  encouraged her to increase her physical activity to 310 minute walks per day to help with her weight reduction. Continue  present medications. Will refer to opthalmology referral for an annual eye check.

## 2013-05-12 NOTE — Progress Notes (Signed)
Pt has an appt with [facility] on Tuesday 05/19/13 @ 9:15am. Left voicemail of appt time and date on pt phone

## 2013-05-12 NOTE — Patient Instructions (Signed)
Walk to the mailbox 3 times a day

## 2013-05-14 ENCOUNTER — Other Ambulatory Visit: Payer: Self-pay | Admitting: Family Medicine

## 2013-05-22 ENCOUNTER — Telehealth: Payer: Self-pay | Admitting: Family Medicine

## 2013-05-22 NOTE — Telephone Encounter (Signed)
Pt called and stated that due insurance she needs a new rx written for lodine. Even though it is mailorder she needs this rx written for 30 days at a time instead of 90. Please change.

## 2013-05-22 NOTE — Telephone Encounter (Signed)
I left a message on the patients voicemail that the new written Rx was ready for pick up. CLS

## 2013-06-03 ENCOUNTER — Other Ambulatory Visit: Payer: Self-pay | Admitting: Family Medicine

## 2013-07-21 ENCOUNTER — Other Ambulatory Visit: Payer: Self-pay | Admitting: Family Medicine

## 2013-08-04 ENCOUNTER — Other Ambulatory Visit: Payer: Self-pay

## 2013-08-04 ENCOUNTER — Telehealth: Payer: Self-pay | Admitting: Family Medicine

## 2013-08-04 MED ORDER — ETODOLAC 500 MG PO TABS: ORAL_TABLET | ORAL | Status: DC

## 2013-08-04 NOTE — Telephone Encounter (Signed)
I HAVE SENT MEDS AND ASKED PT TO CALL HER INS. AND FIND OUT WHAT EYE DR. THAT HER INS. COVERS

## 2013-08-12 ENCOUNTER — Other Ambulatory Visit: Payer: Self-pay | Admitting: Family Medicine

## 2013-09-11 ENCOUNTER — Ambulatory Visit: Payer: No Typology Code available for payment source | Admitting: Family Medicine

## 2013-11-19 ENCOUNTER — Other Ambulatory Visit: Payer: Self-pay | Admitting: Family Medicine

## 2013-11-19 NOTE — Telephone Encounter (Signed)
Is this okay?

## 2014-02-02 ENCOUNTER — Encounter (HOSPITAL_COMMUNITY): Payer: Self-pay | Admitting: *Deleted

## 2014-02-10 NOTE — Anesthesia Preprocedure Evaluation (Addendum)
Anesthesia Evaluation  Patient identified by MRN, date of birth, ID band Patient awake    Reviewed: Allergy & Precautions, NPO status , Patient's Chart, lab work & pertinent test results  History of Anesthesia Complications Negative for: history of anesthetic complications  Airway Mallampati: III  TM Distance: >3 FB Neck ROM: Full    Dental no notable dental hx. (+) Dental Advisory Given, Poor Dentition, Chipped,    Pulmonary asthma , Current Smoker,  breath sounds clear to auscultation  Pulmonary exam normal       Cardiovascular hypertension, Pt. on medications Rhythm:Regular Rate:Normal     Neuro/Psych  Headaches, negative psych ROS   GI/Hepatic negative GI ROS, Past hx of cocaine use and ETOH use   Endo/Other  diabetes, Type 2, Oral Hypoglycemic AgentsMorbid obesity  Renal/GU negative Renal ROS  negative genitourinary   Musculoskeletal  (+) Arthritis -, Osteoarthritis,    Abdominal (+) + obese,   Peds negative pediatric ROS (+)  Hematology negative hematology ROS (+)   Anesthesia Other Findings   Reproductive/Obstetrics negative OB ROS                            Anesthesia Physical Anesthesia Plan  ASA: III  Anesthesia Plan: MAC   Post-op Pain Management:    Induction: Intravenous  Airway Management Planned: Natural Airway and Nasal Cannula  Additional Equipment:   Intra-op Plan:   Post-operative Plan:   Informed Consent: I have reviewed the patients History and Physical, chart, labs and discussed the procedure including the risks, benefits and alternatives for the proposed anesthesia with the patient or authorized representative who has indicated his/her understanding and acceptance.   Dental advisory given  Plan Discussed with: CRNA  Anesthesia Plan Comments:         Anesthesia Quick Evaluation

## 2014-02-11 ENCOUNTER — Ambulatory Visit (HOSPITAL_COMMUNITY)
Admission: RE | Admit: 2014-02-11 | Discharge: 2014-02-11 | Disposition: A | Discharge status: Home / Self Care | Payer: Medicare HMO | Source: Ambulatory Visit | Attending: Gastroenterology | Admitting: Gastroenterology

## 2014-02-11 ENCOUNTER — Encounter (HOSPITAL_COMMUNITY): Payer: Self-pay | Admitting: Gastroenterology

## 2014-02-11 ENCOUNTER — Ambulatory Visit (HOSPITAL_COMMUNITY): Payer: Medicare HMO | Admitting: Anesthesiology

## 2014-02-11 ENCOUNTER — Encounter (HOSPITAL_COMMUNITY)
Admission: RE | Disposition: A | Discharge status: Home / Self Care | Payer: Self-pay | Source: Ambulatory Visit | Attending: Gastroenterology

## 2014-02-11 DIAGNOSIS — F17210 Nicotine dependence, cigarettes, uncomplicated: Secondary | ICD-10-CM | POA: Insufficient documentation

## 2014-02-11 DIAGNOSIS — M170 Bilateral primary osteoarthritis of knee: Secondary | ICD-10-CM | POA: Diagnosis not present

## 2014-02-11 DIAGNOSIS — Z6841 Body Mass Index (BMI) 40.0 and over, adult: Secondary | ICD-10-CM | POA: Diagnosis not present

## 2014-02-11 DIAGNOSIS — Z1211 Encounter for screening for malignant neoplasm of colon: Secondary | ICD-10-CM | POA: Insufficient documentation

## 2014-02-11 DIAGNOSIS — Z7982 Long term (current) use of aspirin: Secondary | ICD-10-CM | POA: Diagnosis not present

## 2014-02-11 DIAGNOSIS — J45909 Unspecified asthma, uncomplicated: Secondary | ICD-10-CM | POA: Insufficient documentation

## 2014-02-11 DIAGNOSIS — E119 Type 2 diabetes mellitus without complications: Secondary | ICD-10-CM | POA: Diagnosis not present

## 2014-02-11 DIAGNOSIS — I10 Essential (primary) hypertension: Secondary | ICD-10-CM | POA: Insufficient documentation

## 2014-02-11 DIAGNOSIS — Z79899 Other long term (current) drug therapy: Secondary | ICD-10-CM | POA: Insufficient documentation

## 2014-02-11 DIAGNOSIS — K5730 Diverticulosis of large intestine without perforation or abscess without bleeding: Secondary | ICD-10-CM | POA: Diagnosis not present

## 2014-02-11 HISTORY — DX: Headache, unspecified: R51.9

## 2014-02-11 HISTORY — DX: Unspecified osteoarthritis, unspecified site: M19.90

## 2014-02-11 HISTORY — DX: Type 2 diabetes mellitus without complications: E11.9

## 2014-02-11 HISTORY — PX: COLONOSCOPY: SHX5424

## 2014-02-11 HISTORY — DX: Headache: R51

## 2014-02-11 LAB — GLUCOSE, CAPILLARY: GLUCOSE-CAPILLARY: 126 mg/dL — AB (ref 70–99)

## 2014-02-11 SURGERY — COLONOSCOPY
Anesthesia: Monitor Anesthesia Care

## 2014-02-11 MED ORDER — LIDOCAINE HCL (CARDIAC) 20 MG/ML IV SOLN
INTRAVENOUS | Status: AC
  Filled 2014-02-11: qty 5

## 2014-02-11 MED ORDER — LIDOCAINE HCL (CARDIAC) 20 MG/ML IV SOLN
INTRAVENOUS | Status: DC | PRN
  Administered 2014-02-11 (×2): 50 mg via INTRAVENOUS

## 2014-02-11 MED ORDER — PROPOFOL 10 MG/ML IV BOLUS
INTRAVENOUS | Status: AC
  Filled 2014-02-11: qty 20

## 2014-02-11 MED ORDER — PROPOFOL INFUSION 10 MG/ML OPTIME
INTRAVENOUS | Status: DC | PRN
  Administered 2014-02-11: 180 ug/kg/min via INTRAVENOUS

## 2014-02-11 MED ORDER — LACTATED RINGERS IV SOLN
INTRAVENOUS | Status: DC
  Administered 2014-02-11: 1000 mL via INTRAVENOUS

## 2014-02-11 MED ORDER — PROPOFOL 10 MG/ML IV BOLUS
INTRAVENOUS | Status: DC | PRN
  Administered 2014-02-11: 50 mg via INTRAVENOUS

## 2014-02-11 MED ORDER — SODIUM CHLORIDE 0.9 % IV SOLN: INTRAVENOUS | Status: DC

## 2014-02-11 NOTE — Anesthesia Postprocedure Evaluation (Signed)
  Anesthesia Post-op Note  Patient: Angie Carr  Procedure(s) Performed: Procedure(s) (LRB): COLONOSCOPY (N/A)  Patient Location: PACU  Anesthesia Type: MAC  Level of Consciousness: awake and alert   Airway and Oxygen Therapy: Patient Spontanous Breathing  Post-op Pain: mild  Post-op Assessment: Post-op Vital signs reviewed, Patient's Cardiovascular Status Stable, Respiratory Function Stable, Patent Airway and No signs of Nausea or vomiting  Last Vitals:  Filed Vitals:   02/11/14 0925  BP: 127/72  Pulse:   Temp:   Resp:     Post-op Vital Signs: stable   Complications: No apparent anesthesia complications

## 2014-02-11 NOTE — Op Note (Signed)
[facility] [address] [place], [id number]   COLONOSCOPY PROCEDURE REPORT  PATIENT: Angie Carr, [name]  MR#: [id number] BIRTHDATE: 27-Jul-1955 , 58  yrs. old GENDER: female ENDOSCOPIST: Bernette Redbirdobert Shanita Kanan, MD REFERRED BY:  Dr. Renaye RakersVeita Bland PROCEDURE DATE:  02/11/2014 PROCEDURE:   colonoscopy ASA CLASS:   III INDICATIONS:initial colon cancer screening exam MEDICATIONS: MAC  DESCRIPTION OF PROCEDURE:   After the risks and benefits and of the procedure were explained, informed consent was obtained.  digital rectal exam was unremarkable         The [id number] ([id number]  adult colonoscope was introduced through the anus and advanced to the cecum, as identified by visualization of the ileal cecal valve and appendiceal orifice      .  The quality of the prep was very good; there was a small amount of residual fecal material extruded from diverticula, but for the most part, residual liquid stool could be suctioned out, exposing mucosa with excellent visualization      . The instrument was then slowly withdrawn as the colon was fully examined.   There was moderately severe sigmoid diverticulosis, with scattered diverticulosis all the way to the mid colon.  This was otherwise a normal exam. No polyps, cancer, colitis, or vascular malformations were observed.  Retroflexion in the rectum was normal, as well as reinspection of the rectum.          The scope was then withdrawn from the patient and the procedure completed.no biopsies were obtained.  WITHDRAWAL TIME: 12 minutes  COMPLICATIONS: There were no immediate complications.  ENDOSCOPIC IMPRESSION: 1. Extensive left-sided diverticulosis 2. Otherwise normal colonoscopy  RECOMMENDATIONS: Continue routine colon cancer screening at 10 year intervals as currently recommended.  REPEAT EXAM: colonoscopy in 10 years  cc:  _______________________________ eSignedBernette Redbird:  Quantay Zaremba, MD 02/11/2014 9:30 AM   CPT  CODES: ICD CODES:  The ICD and CPT codes recommended by this software are interpretations from the data that the clinical staff has captured with the software.  The verification of the translation of this report to the ICD and CPT codes and modifiers is the sole responsibility of the health care institution and practicing physician where this report was generated.  [employer] will not be held responsible for the validity of the ICD and CPT codes included on this report.  AMA assumes no liability for data contained or not contained herein. CPT is a Publishing rights managerregistered trademark of the [employer].   PATIENT NAME:  Angie Carr, [name] MR#: [id number]

## 2014-02-11 NOTE — Transfer of Care (Signed)
Immediate Anesthesia Transfer of Care Note  Patient: Angie Carr  Procedure(s) Performed: Procedure(s): COLONOSCOPY (N/A)  Patient Location: PACU  Anesthesia Type:MAC  Level of Consciousness: awake, alert  and oriented  Airway & Oxygen Therapy: Patient Spontanous Breathing and Patient connected to face mask oxygen  Post-op Assessment: Report given to RN and Post -op Vital signs reviewed and stable  Post vital signs: Reviewed and stable  Last Vitals:  Filed Vitals:   02/11/14 0736  Pulse: 94  Temp: 37.4 C  Resp: 17    Complications: No apparent anesthesia complications

## 2014-02-11 NOTE — H&P (Signed)
Angie CapriceGladys M Ephriam KnucklesChristian is an 59 y.o. female.   Chief Complaint: Screening for colon cancer HPI: Initial screening exam.  No lower GI Sx.  No fam hx of colon cancer.  Past Medical History  Diagnosis Date  . Hypertension   . Morbid obesity   . LBP (low back pain)   . Primary osteoarthritis of both knees   . Smoker   . Diabetes mellitus without complication   . Asthma     pt denies  . Headache     miagraines, none in years  . DJD (degenerative joint disease)     lower back    Past Surgical History  Procedure Laterality Date  . Tubal ligation    . Abdominal hysterectomy      complete  . Cholecystectomy    . Hernia repair      Family History  Problem Relation Age of Onset  . Hypertension Other   . Stroke Other    Social History:  reports that she has been smoking Cigarettes.  She has been smoking about 0.50 packs per day. She has never used smokeless tobacco. She reports that she uses illicit drugs (Marijuana). She reports that she does not drink alcohol.  Allergies: No Known Allergies  Medications Prior to Admission  Medication Sig Dispense Refill  . amLODipine (NORVASC) 10 MG tablet Take 10 mg by mouth every morning.    Marland Kitchen. aspirin 81 MG tablet Take 81 mg by mouth daily.    Marland Kitchen. etodolac (LODINE) 500 MG tablet Take 1 tablet (500 mg total) by mouth 2 (two) times daily. 180 tablet 3  . lisinopril-hydrochlorothiazide (PRINZIDE,ZESTORETIC) 20-12.5 MG per tablet Take 1 tablet by mouth 2 (two) times daily.    . metFORMIN (GLUCOPHAGE) 1000 MG tablet Take 1,000 mg by mouth daily with breakfast.    . omeprazole (PRILOSEC) 20 MG capsule Take 20 mg by mouth daily.    . potassium chloride SA (K-DUR,KLOR-CON) 20 MEQ tablet Take 20 mEq by mouth daily.    . pravastatin (PRAVACHOL) 20 MG tablet Take 20 mg by mouth daily.    Marland Kitchen. amLODipine (NORVASC) 10 MG tablet TAKE 1 TABLET DAILY (Patient not taking: Reported on 01/27/2014) 90 tablet 2  . etodolac (LODINE) 500 MG tablet TAKE 1 TABLET BY MOUTH  TWICE DAILY (Patient not taking: Reported on 01/27/2014) 60 tablet 1  . lisinopril-hydrochlorothiazide (PRINZIDE,ZESTORETIC) 20-12.5 MG per tablet TAKE 1 TABLET TWICE A DAY (Patient not taking: Reported on 01/27/2014) 180 tablet 2  . metFORMIN (GLUCOPHAGE) 1000 MG tablet TAKE 1 TABLET TWICE A DAY WITH MEALS (Patient not taking: Reported on 01/27/2014) 180 tablet 0  . omeprazole (PRILOSEC) 20 MG capsule TAKE 1 CAPSULE DAILY (Patient not taking: Reported on 01/27/2014) 90 capsule 3  . pravastatin (PRAVACHOL) 20 MG tablet TAKE 1 TABLET DAILY (Patient not taking: Reported on 01/27/2014) 90 tablet 2    Results for orders placed or performed during the hospital encounter of 02/11/14 (from the past 48 hour(s))  Glucose, capillary     Status: Abnormal   Collection Time: 02/11/14  8:09 AM  Result Value Ref Range   Glucose-Capillary 126 (H) 70 - 99 mg/dL   No results found.  ROS  No c/o's this a.m., no chg in status since recent ov  Pulse 94, temperature 99.4 F (37.4 C), temperature source Oral, resp. rate 17, height 5\' 4"  (1.626 m), weight 128.822 kg (284 lb). Physical Exam Pleasant, NAD, alert, coherent.  Chest clr, heart w/out murmur or arrhythmia, abd very obese  but completely nontender.  Assessment/Plan Patient at "standard risk" for colon cancer, without worrisome symptoms. Morbid obesity.  Will plan colonoscopy with anesthesia support to maximize safety.  Seeley Hissong V 02/11/2014, 8:43 AM

## 2014-02-11 NOTE — Discharge Instructions (Signed)

## 2014-02-12 ENCOUNTER — Encounter (HOSPITAL_COMMUNITY): Payer: Self-pay | Admitting: Gastroenterology

## 2014-12-11 ENCOUNTER — Encounter (HOSPITAL_COMMUNITY): Payer: Self-pay | Admitting: Emergency Medicine

## 2014-12-11 ENCOUNTER — Emergency Department (HOSPITAL_COMMUNITY): Payer: Medicare HMO

## 2014-12-11 ENCOUNTER — Inpatient Hospital Stay (HOSPITAL_COMMUNITY)
Admission: EM | Admit: 2014-12-11 | Discharge: 2014-12-14 | DRG: 872 | Disposition: A | Discharge status: Home / Self Care | Payer: Medicare HMO | Attending: Internal Medicine | Admitting: Internal Medicine

## 2014-12-11 DIAGNOSIS — Z7984 Long term (current) use of oral hypoglycemic drugs: Secondary | ICD-10-CM | POA: Diagnosis not present

## 2014-12-11 DIAGNOSIS — E785 Hyperlipidemia, unspecified: Secondary | ICD-10-CM | POA: Diagnosis present

## 2014-12-11 DIAGNOSIS — R6520 Severe sepsis without septic shock: Secondary | ICD-10-CM | POA: Diagnosis present

## 2014-12-11 DIAGNOSIS — I10 Essential (primary) hypertension: Secondary | ICD-10-CM | POA: Diagnosis present

## 2014-12-11 DIAGNOSIS — E118 Type 2 diabetes mellitus with unspecified complications: Secondary | ICD-10-CM | POA: Diagnosis not present

## 2014-12-11 DIAGNOSIS — A419 Sepsis, unspecified organism: Secondary | ICD-10-CM | POA: Diagnosis present

## 2014-12-11 DIAGNOSIS — F17210 Nicotine dependence, cigarettes, uncomplicated: Secondary | ICD-10-CM | POA: Diagnosis present

## 2014-12-11 DIAGNOSIS — Z8249 Family history of ischemic heart disease and other diseases of the circulatory system: Secondary | ICD-10-CM | POA: Diagnosis not present

## 2014-12-11 DIAGNOSIS — I959 Hypotension, unspecified: Secondary | ICD-10-CM | POA: Diagnosis present

## 2014-12-11 DIAGNOSIS — I9589 Other hypotension: Secondary | ICD-10-CM

## 2014-12-11 DIAGNOSIS — N12 Tubulo-interstitial nephritis, not specified as acute or chronic: Secondary | ICD-10-CM | POA: Diagnosis present

## 2014-12-11 DIAGNOSIS — E119 Type 2 diabetes mellitus without complications: Secondary | ICD-10-CM

## 2014-12-11 DIAGNOSIS — M545 Low back pain: Secondary | ICD-10-CM | POA: Diagnosis present

## 2014-12-11 DIAGNOSIS — F1290 Cannabis use, unspecified, uncomplicated: Secondary | ICD-10-CM | POA: Diagnosis present

## 2014-12-11 DIAGNOSIS — K5790 Diverticulosis of intestine, part unspecified, without perforation or abscess without bleeding: Secondary | ICD-10-CM | POA: Diagnosis present

## 2014-12-11 DIAGNOSIS — N179 Acute kidney failure, unspecified: Secondary | ICD-10-CM | POA: Diagnosis present

## 2014-12-11 DIAGNOSIS — R509 Fever, unspecified: Secondary | ICD-10-CM | POA: Diagnosis present

## 2014-12-11 DIAGNOSIS — Z90710 Acquired absence of both cervix and uterus: Secondary | ICD-10-CM | POA: Diagnosis not present

## 2014-12-11 DIAGNOSIS — Z6841 Body Mass Index (BMI) 40.0 and over, adult: Secondary | ICD-10-CM | POA: Diagnosis not present

## 2014-12-11 DIAGNOSIS — D638 Anemia in other chronic diseases classified elsewhere: Secondary | ICD-10-CM | POA: Diagnosis present

## 2014-12-11 DIAGNOSIS — D649 Anemia, unspecified: Secondary | ICD-10-CM | POA: Insufficient documentation

## 2014-12-11 DIAGNOSIS — Z823 Family history of stroke: Secondary | ICD-10-CM

## 2014-12-11 DIAGNOSIS — M170 Bilateral primary osteoarthritis of knee: Secondary | ICD-10-CM | POA: Diagnosis present

## 2014-12-11 DIAGNOSIS — G8929 Other chronic pain: Secondary | ICD-10-CM | POA: Diagnosis present

## 2014-12-11 DIAGNOSIS — Z9049 Acquired absence of other specified parts of digestive tract: Secondary | ICD-10-CM

## 2014-12-11 DIAGNOSIS — E538 Deficiency of other specified B group vitamins: Secondary | ICD-10-CM | POA: Diagnosis present

## 2014-12-11 DIAGNOSIS — E876 Hypokalemia: Secondary | ICD-10-CM | POA: Diagnosis present

## 2014-12-11 DIAGNOSIS — N390 Urinary tract infection, site not specified: Secondary | ICD-10-CM | POA: Diagnosis present

## 2014-12-11 DIAGNOSIS — A5901 Trichomonal vulvovaginitis: Secondary | ICD-10-CM | POA: Diagnosis present

## 2014-12-11 LAB — CBC
HEMATOCRIT: 29.4 % — AB (ref 36.0–46.0)
Hemoglobin: 9.8 g/dL — ABNORMAL LOW (ref 12.0–15.0)
MCH: 28.6 pg (ref 26.0–34.0)
MCHC: 33.3 g/dL (ref 30.0–36.0)
MCV: 85.7 fL (ref 78.0–100.0)
Platelets: 155 10*3/uL (ref 150–400)
RBC: 3.43 MIL/uL — AB (ref 3.87–5.11)
RDW: 16.5 % — ABNORMAL HIGH (ref 11.5–15.5)
WBC: 14.3 10*3/uL — AB (ref 4.0–10.5)

## 2014-12-11 LAB — CBC WITH DIFFERENTIAL/PLATELET
Basophils Absolute: 0 10*3/uL (ref 0.0–0.1)
Basophils Relative: 0 %
EOS ABS: 0 10*3/uL (ref 0.0–0.7)
EOS PCT: 0 %
HCT: 31.4 % — ABNORMAL LOW (ref 36.0–46.0)
Hemoglobin: 10.4 g/dL — ABNORMAL LOW (ref 12.0–15.0)
LYMPHS ABS: 1.7 10*3/uL (ref 0.7–4.0)
LYMPHS PCT: 10 %
MCH: 28.2 pg (ref 26.0–34.0)
MCHC: 33.1 g/dL (ref 30.0–36.0)
MCV: 85.1 fL (ref 78.0–100.0)
MONO ABS: 1.4 10*3/uL — AB (ref 0.1–1.0)
Monocytes Relative: 8 %
Neutro Abs: 13.6 10*3/uL — ABNORMAL HIGH (ref 1.7–7.7)
Neutrophils Relative %: 82 %
PLATELETS: 197 10*3/uL (ref 150–400)
RBC: 3.69 MIL/uL — ABNORMAL LOW (ref 3.87–5.11)
RDW: 16.1 % — AB (ref 11.5–15.5)
WBC: 16.8 10*3/uL — ABNORMAL HIGH (ref 4.0–10.5)

## 2014-12-11 LAB — I-STAT TROPONIN, ED: TROPONIN I, POC: 0.03 ng/mL (ref 0.00–0.08)

## 2014-12-11 LAB — URINE MICROSCOPIC-ADD ON

## 2014-12-11 LAB — COMPREHENSIVE METABOLIC PANEL
ALT: 33 U/L (ref 14–54)
ANION GAP: 12 (ref 5–15)
AST: 44 U/L — ABNORMAL HIGH (ref 15–41)
Albumin: 2.9 g/dL — ABNORMAL LOW (ref 3.5–5.0)
Alkaline Phosphatase: 84 U/L (ref 38–126)
BUN: 20 mg/dL (ref 6–20)
CHLORIDE: 95 mmol/L — AB (ref 101–111)
CO2: 25 mmol/L (ref 22–32)
Calcium: 8.8 mg/dL — ABNORMAL LOW (ref 8.9–10.3)
Creatinine, Ser: 1.21 mg/dL — ABNORMAL HIGH (ref 0.44–1.00)
GFR, EST AFRICAN AMERICAN: 55 mL/min — AB (ref >60)
GFR, EST NON AFRICAN AMERICAN: 48 mL/min — AB (ref >60)
Glucose, Bld: 124 mg/dL — ABNORMAL HIGH (ref 65–99)
POTASSIUM: 4 mmol/L (ref 3.5–5.1)
Sodium: 132 mmol/L — ABNORMAL LOW (ref 135–145)
Total Bilirubin: 1.2 mg/dL (ref 0.3–1.2)
Total Protein: 7.3 g/dL (ref 6.5–8.1)

## 2014-12-11 LAB — URINALYSIS, ROUTINE W REFLEX MICROSCOPIC
Glucose, UA: >1000 mg/dL — AB
KETONES UR: NEGATIVE mg/dL
NITRITE: NEGATIVE
PH: 6 (ref 5.0–8.0)
Protein, ur: NEGATIVE mg/dL
Specific Gravity, Urine: 1.025 (ref 1.005–1.030)

## 2014-12-11 LAB — RETICULOCYTES
RBC.: 3.43 MIL/uL — ABNORMAL LOW (ref 3.87–5.11)
RETIC CT PCT: 0.8 % (ref 0.4–3.1)
Retic Count, Absolute: 27.4 10*3/uL (ref 19.0–186.0)

## 2014-12-11 LAB — LACTIC ACID, PLASMA
LACTIC ACID, VENOUS: 1 mmol/L (ref 0.5–2.0)
Lactic Acid, Venous: 1.2 mmol/L (ref 0.5–2.0)

## 2014-12-11 LAB — LIPASE, BLOOD: LIPASE: 14 U/L (ref 11–51)

## 2014-12-11 MED ORDER — AZITHROMYCIN 250 MG PO TABS
1000.0000 mg | ORAL_TABLET | Freq: Once | ORAL | Status: AC
  Administered 2014-12-11: 1000 mg via ORAL
  Filled 2014-12-11: qty 4

## 2014-12-11 MED ORDER — BARIUM SULFATE 2.1 % PO SUSP
900.0000 mL | Freq: Once | ORAL | Status: DC
Start: 2014-12-11 — End: 2014-12-13

## 2014-12-11 MED ORDER — MORPHINE SULFATE (PF) 2 MG/ML IV SOLN
2.0000 mg | INTRAVENOUS | Status: DC | PRN
Start: 2014-12-11 — End: 2014-12-14

## 2014-12-11 MED ORDER — PRAVASTATIN SODIUM 20 MG PO TABS
20.0000 mg | ORAL_TABLET | Freq: Every day | ORAL | Status: DC
  Filled 2014-12-11: qty 1

## 2014-12-11 MED ORDER — ONDANSETRON HCL 4 MG PO TABS: 4.0000 mg | ORAL_TABLET | Freq: Four times a day (QID) | ORAL | Status: DC | PRN

## 2014-12-11 MED ORDER — POTASSIUM CHLORIDE IN NACL 20-0.9 MEQ/L-% IV SOLN
INTRAVENOUS | Status: DC
  Administered 2014-12-11 – 2014-12-13 (×4): via INTRAVENOUS
  Filled 2014-12-11 (×4): qty 1000

## 2014-12-11 MED ORDER — ONDANSETRON HCL 4 MG/2ML IJ SOLN: 4.0000 mg | Freq: Four times a day (QID) | INTRAMUSCULAR | Status: DC | PRN

## 2014-12-11 MED ORDER — SODIUM CHLORIDE 0.9 % IV SOLN
2000.0000 mg | Freq: Once | INTRAVENOUS | Status: AC
  Administered 2014-12-11: 2000 mg via INTRAVENOUS
  Filled 2014-12-11: qty 2000

## 2014-12-11 MED ORDER — SODIUM CHLORIDE 0.9 % IV BOLUS (SEPSIS)
1000.0000 mL | Freq: Once | INTRAVENOUS | Status: AC
  Administered 2014-12-11: 1000 mL via INTRAVENOUS

## 2014-12-11 MED ORDER — IBUPROFEN 400 MG PO TABS
600.0000 mg | ORAL_TABLET | Freq: Once | ORAL | Status: AC
  Administered 2014-12-11: 600 mg via ORAL
  Filled 2014-12-11: qty 1

## 2014-12-11 MED ORDER — ONDANSETRON HCL 4 MG PO TABS
4.0000 mg | ORAL_TABLET | Freq: Once | ORAL | Status: AC
  Administered 2014-12-11: 4 mg via ORAL
  Filled 2014-12-11: qty 1

## 2014-12-11 MED ORDER — BARIUM SULFATE 2.1 % PO SUSP
ORAL | Status: AC
  Filled 2014-12-11: qty 2

## 2014-12-11 MED ORDER — OXYCODONE-ACETAMINOPHEN 5-325 MG PO TABS
1.0000 | ORAL_TABLET | Freq: Once | ORAL | Status: AC
  Administered 2014-12-11: 1 via ORAL
  Filled 2014-12-11: qty 1

## 2014-12-11 MED ORDER — INSULIN ASPART 100 UNIT/ML ~~LOC~~ SOLN
0.0000 [IU] | Freq: Three times a day (TID) | SUBCUTANEOUS | Status: DC
  Administered 2014-12-13: 3 [IU] via SUBCUTANEOUS

## 2014-12-11 MED ORDER — VANCOMYCIN HCL 10 G IV SOLR
1500.0000 mg | INTRAVENOUS | Status: DC
  Administered 2014-12-12: 1500 mg via INTRAVENOUS
  Filled 2014-12-11 (×2): qty 1500

## 2014-12-11 MED ORDER — LIDOCAINE HCL (PF) 1 % IJ SOLN
2.0000 mL | Freq: Once | INTRAMUSCULAR | Status: AC
  Administered 2014-12-11: 2 mL
  Filled 2014-12-11: qty 5

## 2014-12-11 MED ORDER — OXYCODONE-ACETAMINOPHEN 5-325 MG PO TABS: 1.0000 | ORAL_TABLET | Freq: Four times a day (QID) | ORAL | Status: DC | PRN

## 2014-12-11 MED ORDER — SODIUM CHLORIDE 0.9 % IJ SOLN
3.0000 mL | Freq: Two times a day (BID) | INTRAMUSCULAR | Status: DC
  Administered 2014-12-11 – 2014-12-14 (×5): 3 mL via INTRAVENOUS

## 2014-12-11 MED ORDER — ACETAMINOPHEN 325 MG PO TABS
650.0000 mg | ORAL_TABLET | Freq: Four times a day (QID) | ORAL | Status: DC | PRN
  Administered 2014-12-12: 650 mg via ORAL
  Filled 2014-12-11: qty 2

## 2014-12-11 MED ORDER — PIPERACILLIN-TAZOBACTAM 3.375 G IVPB
3.3750 g | Freq: Once | INTRAVENOUS | Status: AC
  Administered 2014-12-11: 3.375 g via INTRAVENOUS
  Filled 2014-12-11: qty 50

## 2014-12-11 MED ORDER — POLYETHYLENE GLYCOL 3350 17 G PO PACK: 17.0000 g | PACK | Freq: Every day | ORAL | Status: DC | PRN

## 2014-12-11 MED ORDER — SODIUM CHLORIDE 0.9 % IV BOLUS (SEPSIS)
1000.0000 mL | Freq: Once | INTRAVENOUS | Status: AC
Start: 2014-12-11 — End: 2014-12-13
  Administered 2014-12-11: 1000 mL via INTRAVENOUS

## 2014-12-11 MED ORDER — PIPERACILLIN-TAZOBACTAM 3.375 G IVPB
3.3750 g | Freq: Three times a day (TID) | INTRAVENOUS | Status: DC
  Administered 2014-12-12 – 2014-12-13 (×4): 3.375 g via INTRAVENOUS
  Filled 2014-12-11 (×6): qty 50

## 2014-12-11 MED ORDER — HYDROCODONE-ACETAMINOPHEN 5-325 MG PO TABS
1.0000 | ORAL_TABLET | ORAL | Status: DC | PRN
  Administered 2014-12-12 – 2014-12-14 (×4): 2 via ORAL
  Filled 2014-12-11 (×4): qty 2

## 2014-12-11 MED ORDER — SODIUM CHLORIDE 0.9 % IV SOLN: INTRAVENOUS | Status: DC

## 2014-12-11 MED ORDER — CEFTRIAXONE SODIUM 250 MG IJ SOLR
250.0000 mg | Freq: Once | INTRAMUSCULAR | Status: AC
  Administered 2014-12-11: 250 mg via INTRAMUSCULAR
  Filled 2014-12-11: qty 250

## 2014-12-11 MED ORDER — DEXTROSE 5 % IV SOLN
1.0000 g | Freq: Once | INTRAVENOUS | Status: AC
  Administered 2014-12-11: 1 g via INTRAVENOUS
  Filled 2014-12-11: qty 10

## 2014-12-11 MED ORDER — METRONIDAZOLE 500 MG PO TABS
2000.0000 mg | ORAL_TABLET | Freq: Once | ORAL | Status: AC
  Administered 2014-12-11: 2000 mg via ORAL
  Filled 2014-12-11: qty 4

## 2014-12-11 MED ORDER — HEPARIN SODIUM (PORCINE) 5000 UNIT/ML IJ SOLN
5000.0000 [IU] | Freq: Three times a day (TID) | INTRAMUSCULAR | Status: DC
  Administered 2014-12-12 – 2014-12-14 (×6): 5000 [IU] via SUBCUTANEOUS
  Filled 2014-12-11 (×7): qty 1

## 2014-12-11 MED ORDER — SODIUM CHLORIDE 0.9 % IV BOLUS (SEPSIS)
700.0000 mL | Freq: Once | INTRAVENOUS | Status: AC
  Administered 2014-12-11: 700 mL via INTRAVENOUS

## 2014-12-11 MED ORDER — ASPIRIN EC 81 MG PO TBEC
81.0000 mg | DELAYED_RELEASE_TABLET | Freq: Every day | ORAL | Status: DC
  Administered 2014-12-12 – 2014-12-14 (×3): 81 mg via ORAL
  Filled 2014-12-11 (×4): qty 1

## 2014-12-11 MED ORDER — ACETAMINOPHEN 650 MG RE SUPP: 650.0000 mg | Freq: Four times a day (QID) | RECTAL | Status: DC | PRN

## 2014-12-11 NOTE — Consult Note (Signed)
Name: Angie Carr MRN: [id number] DOB: 1955/01/06    ADMISSION DATE:  12/11/2014 CONSULTATION DATE:  12/11/14  REFERRING MD :  Opyd  REASON FOR CONSULTATION:  Low bp  BRIEF PATIENT DESCRIPTION: 59yo AAF  SIGNIFICANT EVENTS  Admitted for sepsis 2/2 UTI w/hypotension  STUDIES:  UA with slight WBC, no other source   HISTORY OF PRESENT ILLNESS:  59yo AAF who presents to the ED after feeling ill for the last couple days. She was admitted to the hospitalist service and started on therapy for UTI, rocephin. Upon further questioning, she states that she has been feeling fine outside of some myalgias in her lower extremities and some urinary issues for which she was taking pyridium as well as cranberry juice. She began having fevers and decided to come in. She states that several people in her building have been ill as of late with viral illnesses.   PAST MEDICAL HISTORY :   has a past medical history of Hypertension; Morbid obesity (HCC); LBP (low back pain); Primary osteoarthritis of both knees; Smoker; Diabetes mellitus without complication (HCC); Asthma; Headache; and DJD (degenerative joint disease).  has past surgical history that includes Tubal ligation; Abdominal hysterectomy; Cholecystectomy; Hernia repair; and Colonoscopy (N/A, 02/11/2014). Prior to Admission medications   Medication Sig Start Date End Date Taking? Authorizing Provider  amLODipine (NORVASC) 10 MG tablet TAKE 1 TABLET DAILY 05/01/13  Yes Ronnald NianJohn C Lalonde, MD  aspirin 81 MG tablet Take 81 mg by mouth daily.   Yes Historical Provider, MD  etodolac (LODINE) 500 MG tablet Take 1 tablet (500 mg total) by mouth 2 (two) times daily. 05/12/13  Yes Ronnald NianJohn C Lalonde, MD  GLYXAMBI 25-5 MG TABS Take 1 tablet by mouth daily. 11/08/14  Yes Historical Provider, MD  lisinopril-hydrochlorothiazide (PRINZIDE,ZESTORETIC) 20-12.5 MG per tablet TAKE 1 TABLET TWICE A DAY 04/29/13  Yes Ronnald NianJohn C Lalonde, MD  metFORMIN (GLUCOPHAGE) 1000 MG  tablet TAKE 1 TABLET TWICE A DAY WITH MEALS 05/14/13  Yes Ronnald NianJohn C Lalonde, MD  omeprazole (PRILOSEC) 20 MG capsule TAKE 1 CAPSULE DAILY 07/21/13  Yes Ronnald NianJohn C Lalonde, MD  potassium chloride SA (K-DUR,KLOR-CON) 20 MEQ tablet Take 20 mEq by mouth daily.   Yes Historical Provider, MD  pravastatin (PRAVACHOL) 20 MG tablet TAKE 1 TABLET DAILY 05/01/13  Yes Ronnald NianJohn C Lalonde, MD  etodolac (LODINE) 500 MG tablet TAKE 1 TABLET BY MOUTH TWICE DAILY Patient not taking: Reported on 01/27/2014 11/19/13   Ronnald NianJohn C Lalonde, MD  oxyCODONE-acetaminophen (PERCOCET/ROXICET) 5-325 MG tablet Take 1 tablet by mouth every 6 (six) hours as needed for severe pain. 12/11/14   Courteney Lyn Mackuen, MD   No Known Allergies  FAMILY HISTORY:  family history includes Hypertension in her other; Stroke in her other. SOCIAL HISTORY:  reports that she has been smoking Cigarettes.  She has been smoking about 0.50 packs per day. She has never used smokeless tobacco. She reports that she uses illicit drugs (Marijuana). She reports that she does not drink alcohol.  REVIEW OF SYSTEMS:   Constitutional: +for fever, chills, -weight loss, +malaise/fatigue and diaphoresis.  HENT: Negative for hearing loss, ear pain, nosebleeds, congestion, sore throat, neck pain, tinnitus and ear discharge.   Eyes: Negative for blurred vision, double vision, photophobia, pain, discharge and redness.  Respiratory: Negative for cough, hemoptysis, sputum production, shortness of breath, wheezing and stridor.   Cardiovascular: Negative for chest pain, palpitations, orthopnea, claudication, leg swelling and PND.  Gastrointestinal: Negative for heartburn, nausea, vomiting, abdominal pain, diarrhea,  constipation, blood in stool and melena.  Genitourinary: Negative for dysuria, urgency, frequency, hematuria and flank pain.  Musculoskeletal: +for myalgias, -back pain, joint pain and falls.  Skin: Negative for itching and rash.  Neurological: Negative for dizziness,  tingling, tremors, sensory change, speech change, focal weakness, seizures, loss of consciousness, weakness and headaches.  Endo/Heme/Allergies: Negative for environmental allergies and polydipsia. Does not bruise/bleed easily.  SUBJECTIVE:   VITAL SIGNS: Temp:  [99.5 F (37.5 C)-103.1 F (39.5 C)] 99.5 F (37.5 C) (12/10 2115) Pulse Rate:  [93-114] 93 (12/10 2205) Resp:  [14-26] 14 (12/10 2205) BP: (72-111)/(41-84) 88/58 mmHg (12/10 2205) SpO2:  [92 %-98 %] 96 % (12/10 2205) Weight:  [123.378 kg (272 lb)] 123.378 kg (272 lb) (12/10 1119)  PHYSICAL EXAMINATION: General: 59yo AAF who appears her stated age, is in no acute distress, well nourished and well developed. Morbidly obese Neuro:  Cranial nerves II-XII grossly intact. PERRL. EOMI. Sensation and strength. no focal deficits. Patient sitting up in her chair watching TV.  HEENT: Head, normocephalic, atraumatic. Ears symmetric, permeable. Eyes: no conjunctival icterus, no erythema. Nose: permeable, midline septum,  Clear oropharynx,fair dentition. Cardiovascular: S1S2 RRR + 2/6 systolic murmur, no rubs, or gallops auscultated. No thrills palpated.  Lungs:  Chest symmetrical with respirations, clear to auscultation bilaterally with no wheezing, crackles, equal expansion. Difficult to auscultate 2/2 habitus Abdomen:  Soft, nontender, no guarding, nondistended. Present bowel sounds. Musculoskeletal:  No muscle atrophy noted nor weakness. ROM intact. No swelling nor tenderness of the joints.  Skin:  No notable scars, rashes, cruises. No bed sores noted.  Lymphatics: no palpable lymphadenopathy of cervical, supraclvicular nor inguinal areas.      Recent Labs Lab 12/11/14 1225  NA 132*  K 4.0  CL 95*  CO2 25  BUN 20  CREATININE 1.21*  GLUCOSE 124*    Recent Labs Lab 12/11/14 1225  HGB 10.4*  HCT 31.4*  WBC 16.8*  PLT 197   Dg Chest 2 View  12/11/2014  CLINICAL DATA:  Weakness, chest pain EXAM: CHEST  2 VIEW COMPARISON:   07/15/2009 FINDINGS: Borderline cardiomegaly again noted. No acute infiltrate or pleural effusion. No pulmonary edema. Mild degenerative changes thoracic spine. Atherosclerotic calcifications of thoracic aorta. IMPRESSION: No active cardiopulmonary disease. Borderline cardiomegaly again noted. Electronically Signed   By: Natasha Mead M.D.   On: 12/11/2014 12:39    ASSESSMENT / PLAN: Sepsis 2/2 UTI, Low blood pressure, leukocytosis, and fever - pt states that she has been on pyridium and cranberry juice for a UTI over the last several days upon further interrogation at the bedside  - S/p IVF boluses - continue antibiotics. - patient tolerating IVF well. No shortness of breath.  - clinically patient looks well.   Critical care time including IVF boluses, review of exams, MDM, discussions with patient and nurses, 34 minutes.   Pulmonary and Critical Care Medicine [facility] Pager: [phone]  12/11/2014, 10:50 PM

## 2014-12-11 NOTE — H&P (Signed)
[facility] History and Physical  Angie Carr JXB:[id number] DOB: Feb 17, 1955 DOA: 12/11/2014  Referring physician: ED physician PCP: Geraldo PitterBLAND,VEITA J, MD  Specialists:   Chief Complaint:   HPI: Angie RuddleGladys M Carr is a 59 y.o. female with PMH of DJD with chronic low back pain, osteoarthritis of bilateral knees, obesity, diabetes, hyperlipidemia, and hypertension who presents with extreme fatigue, chills, and body aches of 5 days' duration. She had been in her usual health prior to this and denies any cough, dyspnea, rhinorrhea, sore throat, nausea, vomiting, diarrhea, dysuria, or increased urinary urgency or frequency. She also denies headaches or neck stiffness, and also denies any confusion or loss of coordination. She denies any wounds or rashes, or red, hot, swollen joints. There is been no sick contacts or recent long distance travel. She has never had these symptoms previously. She lives alone and 2 days ago had to call EMS to help her off the floor as she was too weak to get up. This morning, with persistent symptoms, she decided to come into the ED for evaluation.  In ED, patient was found to  the febrile to 103.35F with hypotension to 72/41 and sinus tachycardia in the low 100s. She was in no respiratory distress and was saturating mid 90s on room air. Chest x-ray was negative for acute disease and EKG shows sinus tachycardia but otherwise normal. Chem panel returned with acute kidney injury, and CBC with differential features a leukocytosis to 17,000 with absolute neutrophilia. UA features small leuks and glucosuria, but not convincing for infection, particularly in the absence of symptoms. Trichomonads were seen in the urine and the patient was treated with Flagyl. She was given empiric doses of azithromycin and Rocephin in the emergency department and after 2 L normal saline bolus, systolic blood pressures remained in the 80s. A third liter normal saline was ordered as a bolus and  the hospitalists were paged to admit.  Where does patient live?   At home    Can patient participate in ADLs?  Yes     Review of Systems:   General: Chills, sweats, body aches, and fatigue. No weight change, poor appetite  HEENT: no blurry vision, hearing changes or sore throat Pulm: no dyspnea, cough, or wheeze CV: no chest pain or palpitations Abd: Mild, fleeting abd pain. no nausea, vomiting, diarrhea, or constipation GU: no dysuria, hematuria, increased urinary frequency, or urgency  Ext: no leg edema. B/l thigh aches, Rt > Lt.  Neuro: no focal weakness, numbness, or tingling, no vision change or hearing loss Skin: no rash, no wounds MSK: No muscle spasm, no deformity, no red, hot, or swollen joint Heme: No easy bruising or bleeding Travel history: No recent long distant travel    Allergy: No Known Allergies  Past Medical History  Diagnosis Date  . Hypertension   . Morbid obesity (HCC)   . LBP (low back pain)   . Primary osteoarthritis of both knees   . Smoker   . Diabetes mellitus without complication (HCC)   . Asthma     pt denies  . Headache     miagraines, none in years  . DJD (degenerative joint disease)     lower back    Past Surgical History  Procedure Laterality Date  . Tubal ligation    . Abdominal hysterectomy      complete  . Cholecystectomy    . Hernia repair    . Colonoscopy N/A 02/11/2014    Procedure: COLONOSCOPY;  Surgeon: Bernette Redbirdobert Buccini  V, MD;  Location: [facility] END[facility];  Service: Endoscopy;  Laterality: N/A;    Social History:  reports that she has been smoking Cigarettes.  She has been smoking about 0.50 packs per day. She has never used smokeless tobacco. She reports that she uses illicit drugs (Marijuana). She reports that she does not drink alcohol.  Family History:  Family History  Problem Relation Age of Onset  . Hypertension Other   . Stroke Other      Prior to Admission medications   Medication Sig Start Date End Date Taking?  Authorizing Provider  amLODipine (NORVASC) 10 MG tablet TAKE 1 TABLET DAILY 05/01/13  Yes Ronnald Nian, MD  aspirin 81 MG tablet Take 81 mg by mouth daily.   Yes Historical Provider, MD  etodolac (LODINE) 500 MG tablet Take 1 tablet (500 mg total) by mouth 2 (two) times daily. 05/12/13  Yes Ronnald Nian, MD  GLYXAMBI 25-5 MG TABS Take 1 tablet by mouth daily. 11/08/14  Yes Historical Provider, MD  lisinopril-hydrochlorothiazide (PRINZIDE,ZESTORETIC) 20-12.5 MG per tablet TAKE 1 TABLET TWICE A DAY 04/29/13  Yes Ronnald Nian, MD  metFORMIN (GLUCOPHAGE) 1000 MG tablet TAKE 1 TABLET TWICE A DAY WITH MEALS 05/14/13  Yes Ronnald Nian, MD  omeprazole (PRILOSEC) 20 MG capsule TAKE 1 CAPSULE DAILY 07/21/13  Yes Ronnald Nian, MD  potassium chloride SA (K-DUR,KLOR-CON) 20 MEQ tablet Take 20 mEq by mouth daily.   Yes Historical Provider, MD  pravastatin (PRAVACHOL) 20 MG tablet TAKE 1 TABLET DAILY 05/01/13  Yes Ronnald Nian, MD  etodolac (LODINE) 500 MG tablet TAKE 1 TABLET BY MOUTH TWICE DAILY Patient not taking: Reported on 01/27/2014 11/19/13   Ronnald Nian, MD  oxyCODONE-acetaminophen (PERCOCET/ROXICET) 5-325 MG tablet Take 1 tablet by mouth every 6 (six) hours as needed for severe pain. 12/11/14   Courteney Lyn Mackuen, MD    Physical Exam: Filed Vitals:   12/11/14 2007 12/11/14 2030 12/11/14 2115 12/11/14 2205  BP: 90/69 101/57 83/58 88/58   Pulse: 96 94 94 93  Temp: 100.5 F (38.1 C)  99.5 F (37.5 C)   TempSrc: Oral  Oral   Resp: Height:      Weight:      SpO2: 94% 95% 96% 96%   General: Not in acute respiratory distress HEENT:       Eyes: PERRL, EOMI, no scleral icterus or conjunctival pallor.       ENT: No discharge from the ears or nose, no pharyngeal ulcers, petechiae or exudate, no tonsillar enlargement.        Neck: No JVD, no bruit, no appreciable mass Heme: No cervical adenopathy, no pallor Cardiac: S1/S2, RRR. Pulm: Good air movement bilaterally. No rales,  wheezing, rhonchi or rubs. Abd: Soft, nondistended, nontender, no rebound pain or gaurding, BS present. Ext: No LE edema bilaterally. 2+DP/PT pulse bilaterally. Musculoskeletal: No gross deformity, no red, hot, swollen joints, no limitation in ROM. Medial aspect right knee tender, but not red, warm, or swollen.   Skin: No rashes or wounds on exposed surfaces  Neuro: Alert, oriented X3, cranial nerves II-XII grossly intact, no neck stiffness with chin-to-chest/hip flexion. No focal findings Psych: Patient is not overtly psychotic.  Labs on Admission:  Basic Metabolic Panel:  Recent Labs Lab 12/11/14 1225  NA 132*  K 4.0  CL 95*  CO2 25  GLUCOSE 124*  BUN 20  CREATININE 1.21*  CALCIUM 8.8*   Liver Function Tests:  Recent  Labs Lab 12/11/14 1225  AST 44*  ALT 33  ALKPHOS 84  BILITOT 1.2  PROT 7.3  ALBUMIN 2.9*    Recent Labs Lab 12/11/14 1225  LIPASE 14   No results for input(s): AMMONIA in the last 168 hours. CBC:  Recent Labs Lab 12/11/14 1225  WBC 16.8*  NEUTROABS 13.6*  HGB 10.4*  HCT 31.4*  MCV 85.1  PLT 197   Cardiac Enzymes: No results for input(s): CKTOTAL, CKMB, CKMBINDEX, TROPONINI in the last 168 hours.  BNP (last 3 results) No results for input(s): BNP in the last 8760 hours.  ProBNP (last 3 results) No results for input(s): PROBNP in the last 8760 hours.  CBG: No results for input(s): GLUCAP in the last 168 hours.  Radiological Exams on Admission: Dg Chest 2 View  12/11/2014  CLINICAL DATA:  Weakness, chest pain EXAM: CHEST  2 VIEW COMPARISON:  07/15/2009 FINDINGS: Borderline cardiomegaly again noted. No acute infiltrate or pleural effusion. No pulmonary edema. Mild degenerative changes thoracic spine. Atherosclerotic calcifications of thoracic aorta. IMPRESSION: No active cardiopulmonary disease. Borderline cardiomegaly again noted. Electronically Signed   By: Natasha Mead M.D.   On: 12/11/2014 12:39    EKG: Independently reviewed.   Abnormal findings:           Not done in ED, will obtain as appropriate   Assessment/Plan Principal Problem:   Severe sepsis (HCC) Active Problems:   Diabetes mellitus, type 2 (HCC)   Febrile illness   Hypotension   Acute kidney injury (HCC)   1. Severe sepsis  -  febrile to 103.63F, tachycardic in the low 100s, leukocytosis to 17k, no obvious source at this time  - Intra-abdominal source is possible, specifically cholecystitis or diverticulitis, though exam is fairly benign  - hypotensive to the low 70s systolic, now sustaining in the 80s systolic after 3 L normal saline bolus - complete the 30 mL per KG bolus, then consider pressors if BP not responsive - empiric Zosyn and vancomycin with pharmacy consultation given the unknown source  - follow-up blood and urine cultures  - lactic acid improved calcitonin pending - UA with possible infection, though not convincing and no symptoms - CXR no active cardiopulmonary disease - Contrast-enhanced CT of the abdomen and pelvis has been ordered - Dr. Arsenio Loader of PCCM kindly discussed the case with me and agreed to see the patient, thier help much appreciated  2. Hypotension, with history of hypertension - Likely infectious etiology as above - Hold home Norvasc, lisinopril, HCTZ - TTE ordered, pending - Continuous telemetry monitor  3. Acute kidney injury - SCr 1.21 on admission, up from 0.7 previously - Suspect secondary to prerenal insult, specifically hypoperfusion related to severe sepsis, ATN possible - We'll trend chemistries, would expect improvement with fluids   4. Diabetes mellitus - A1c 7.0% on 05/12/2013, A1c this admission pending - Holding home metformin and Glyxambi - CBG monitoring with correctional sliding scale insulin, will add basal if needed - Carbohydrate consistent diet when appropriate  5. Normocytic anemia - Hemoglobin 10.4 on admission, down from 13 previously - No obvious source of bleed - Colonoscopy in  February of this year with severe diverticulosis, but otherwise normal - Check stool for occult blood - DIC less likely but possible, will check labs - Trend H&H   DVT ppx: SQ Heparin    Code Status: Full code Family Communication: None at bed side.      Disposition Plan: Admit to inpatient   Date of Service  12/11/2014    Briscoe Deutscher, MD [facility] Pager [phone]  If 7PM-7AM, please contact night-coverage [email or web address] Password [email or web address] 12/11/2014, 10:35 PM

## 2014-12-11 NOTE — Progress Notes (Signed)
ANTIBIOTIC CONSULT NOTE - INITIAL  Pharmacy Consult for Vancomycin and Zosyn Indication: rule out sepsis  No Known Allergies  Patient Measurements: Height: 5' 5.5" (166.4 cm) Weight: 272 lb (123.378 kg) IBW/kg (Calculated) : 58.15 Adjusted Body Weight:   Vital Signs: Temp: 99.5 F (37.5 C) (12/10 2115) Temp Source: Oral (12/10 2115) BP: 88/58 mmHg (12/10 2205) Pulse Rate: 93 (12/10 2205) Intake/Output from previous day:   Intake/Output from this shift: Total I/O In: 1070 [I.V.:1070] Out: -   Labs:  Recent Labs  12/11/14 1225  WBC 16.8*  HGB 10.4*  PLT 197  CREATININE 1.21*   Estimated Creatinine Clearance: 65.8 mL/min (by C-G formula based on Cr of 1.21). No results for input(s): VANCOTROUGH, VANCOPEAK, VANCORANDOM, GENTTROUGH, GENTPEAK, GENTRANDOM, TOBRATROUGH, TOBRAPEAK, TOBRARND, AMIKACINPEAK, AMIKACINTROU, AMIKACIN in the last 72 hours.   Microbiology: No results found for this or any previous visit (from the past 720 hour(s)).  Medical History: Past Medical History  Diagnosis Date  . Hypertension   . Morbid obesity (HCC)   . LBP (low back pain)   . Primary osteoarthritis of both knees   . Smoker   . Diabetes mellitus without complication (HCC)   . Asthma     pt denies  . Headache     miagraines, none in years  . DJD (degenerative joint disease)     lower back    Medications:  Scheduled:  . aspirin  81 mg Oral Daily  . heparin  5,000 Units Subcutaneous 3 times per day  . [START ON 12/12/2014] insulin aspart  0-20 Units Subcutaneous TID WC  . piperacillin-tazobactam  3.375 g Intravenous Once  . pravastatin  20 mg Oral Daily  . sodium chloride  700 mL Intravenous Once  . sodium chloride  3 mL Intravenous Q12H  . vancomycin  1,000 mg Intravenous Once   Assessment: 59yo female with red swollen knee, Temp 103.1 in ED, and hypotension.  Zosyn 3.375g IV x 1 and Vancomycin 1000mg  IV x 1 have been ordered, neither administered.  Cr 1.21, CrCl  ~49 WBC 16.8 UA (+)bacteria, HG and leukocytes  Goal of Therapy:  Vancomycin trough level 15-20 mcg/ml  Plan:  Continue Zosyn 3.375g IV q8, infuse over 4hr Continue Vancomycin 1500mg  IV q24 Watch renal fxn F/U blood and urine cx Vancomycin trough at ss  Marisue HumbleKendra Saryna Kneeland, PharmD Clinical Pharmacist [facility]

## 2014-12-11 NOTE — ED Provider Notes (Signed)
7:52 PM Assumed care at change of shift. Subsequently febrile and also some hypotension after IVF earlier. Additional fluid ordered. Blood cultures, urine culture, lactic acid and influenza added. UA somewhat suggestive of UTI. Some bacteria with only a few squamous cells notes, WBC and blood noted. No specific urinary complaints but given gram of rocephin to cover possibility. Of note, trichomonas on UA. Flagyl already given to treat this as well as empiric treatment for GC with azithromycin and IM rocephin.   May simply be viral illness. She does not looks ill add continues to have no new complaints aside from myalgias but with hypotension though will admit.   Raeford RazorStephen Brittish Bolinger, MD 12/11/14 2000

## 2014-12-11 NOTE — ED Provider Notes (Addendum)
CSN: [id number]     Arrival date & time 12/11/14  1117 History   First MD Initiated Contact with Patient 12/11/14 1122     Chief Complaint  Patient presents with  . Generalized Body Aches     (Consider location/radiation/quality/duration/timing/severity/associated sxs/prior Treatment) HPI  Patient is a 59 year old female with history of morbid obesity hypertension chronic pain, headaches, diabetes and current smoker. She is presenting today with feelings of generalized body pain. She reports that she since Tuesday has had feelings of fatigue and body aches. She's had no fevers. She's had no vomiting. She's had no upper respiratory symptoms. She reports just feeling achy and weak and tired. Patient's been unable to get her primary care physician but she does not feel well.  Patient had no chest pain. No concerning infectious symptoms.   Past Medical History  Diagnosis Date  . Hypertension   . Morbid obesity (HCC)   . LBP (low back pain)   . Primary osteoarthritis of both knees   . Smoker   . Diabetes mellitus without complication (HCC)   . Asthma     pt denies  . Headache     miagraines, none in years  . DJD (degenerative joint disease)     lower back   Past Surgical History  Procedure Laterality Date  . Tubal ligation    . Abdominal hysterectomy      complete  . Cholecystectomy    . Hernia repair    . Colonoscopy N/A 02/11/2014    Procedure: COLONOSCOPY;  Surgeon: Florencia Reasons, MD;  Location: [facility];  Service: Endoscopy;  Laterality: N/A;   Family History  Problem Relation Age of Onset  . Hypertension Other   . Stroke Other    Social History  Substance Use Topics  . Smoking status: Current Some Day Smoker -- 0.50 packs/day    Types: Cigarettes  . Smokeless tobacco: Never Used  . Alcohol Use: No   OB History    No data available     Review of Systems  Constitutional: Positive for activity change and fatigue. Negative for fever.  HENT: Negative for  congestion.   Eyes: Negative for discharge.  Respiratory: Negative for cough and chest tightness.   Cardiovascular: Negative for chest pain.  Gastrointestinal: Negative for nausea, abdominal pain, diarrhea and abdominal distention.  Endocrine: Negative for polydipsia and polyuria.  Genitourinary: Negative for dysuria, hematuria and difficulty urinating.  Musculoskeletal: Positive for back pain and arthralgias. Negative for joint swelling.  Skin: Negative for rash.  Allergic/Immunologic: Negative for immunocompromised state.  Neurological: Negative for seizures and speech difficulty.  Psychiatric/Behavioral: Negative for confusion and agitation.      Allergies  Review of patient's allergies indicates no known allergies.  Home Medications   Prior to Admission medications   Medication Sig Start Date End Date Taking? Authorizing Provider  amLODipine (NORVASC) 10 MG tablet TAKE 1 TABLET DAILY 05/01/13  Yes Ronnald Nian, MD  aspirin 81 MG tablet Take 81 mg by mouth daily.   Yes Historical Provider, MD  etodolac (LODINE) 500 MG tablet Take 1 tablet (500 mg total) by mouth 2 (two) times daily. 05/12/13  Yes Ronnald Nian, MD  GLYXAMBI 25-5 MG TABS Take 1 tablet by mouth daily. 11/08/14  Yes Historical Provider, MD  lisinopril-hydrochlorothiazide (PRINZIDE,ZESTORETIC) 20-12.5 MG per tablet TAKE 1 TABLET TWICE A DAY 04/29/13  Yes Ronnald Nian, MD  metFORMIN (GLUCOPHAGE) 1000 MG tablet TAKE 1 TABLET TWICE A DAY WITH MEALS 05/14/13  Yes Ronnald NianJohn C Lalonde, MD  omeprazole (PRILOSEC) 20 MG capsule TAKE 1 CAPSULE DAILY 07/21/13  Yes Ronnald NianJohn C Lalonde, MD  potassium chloride SA (K-DUR,KLOR-CON) 20 MEQ tablet Take 20 mEq by mouth daily.   Yes Historical Provider, MD  pravastatin (PRAVACHOL) 20 MG tablet TAKE 1 TABLET DAILY 05/01/13  Yes Ronnald NianJohn C Lalonde, MD  etodolac (LODINE) 500 MG tablet TAKE 1 TABLET BY MOUTH TWICE DAILY Patient not taking: Reported on 01/27/2014 11/19/13   Ronnald NianJohn C Lalonde, MD   BP 101/69 mmHg   Pulse 102  Temp(Src) 99.5 F (37.5 C) (Oral)  Resp 21  Ht 5' 5.5" (1.664 m)  Wt 272 lb (123.378 kg)  BMI 44.56 kg/m2  SpO2 94% Physical Exam  Constitutional: She is oriented to person, place, and time. She appears well-developed and well-nourished.  Morbidly obese African American female.  HENT:  Head: Normocephalic and atraumatic.  Eyes: Conjunctivae are normal. Right eye exhibits no discharge.  Neck: Neck supple.  Cardiovascular: Normal rate and regular rhythm.   Murmur heard. Pulmonary/Chest: Effort normal and breath sounds normal. She has no wheezes. She has no rales.  Abdominal: Soft. She exhibits no distension. There is no tenderness.  Musculoskeletal: Normal range of motion. She exhibits no edema.  Neurological: She is oriented to person, place, and time. No cranial nerve deficit.  Skin: Skin is warm and dry. No rash noted. She is not diaphoretic.  Psychiatric: She has a normal mood and affect. Her behavior is normal.  Nursing note and vitals reviewed.   ED Course  Procedures (including critical care time) Labs Review Labs Reviewed  CBC WITH DIFFERENTIAL/PLATELET - Abnormal; Notable for the following:    WBC 16.8 (*)    RBC 3.69 (*)    Hemoglobin 10.4 (*)    HCT 31.4 (*)    RDW 16.1 (*)    Neutro Abs 13.6 (*)    Monocytes Absolute 1.4 (*)    All other components within normal limits  COMPREHENSIVE METABOLIC PANEL - Abnormal; Notable for the following:    Sodium 132 (*)    Chloride 95 (*)    Glucose, Bld 124 (*)    Creatinine, Ser 1.21 (*)    Calcium 8.8 (*)    Albumin 2.9 (*)    AST 44 (*)    GFR calc non Af Amer 48 (*)    GFR calc Af Amer 55 (*)    All other components within normal limits  URINALYSIS, ROUTINE W REFLEX MICROSCOPIC (NOT AT [facility]) - Abnormal; Notable for the following:    APPearance CLOUDY (*)    Glucose, UA >1000 (*)    Hgb urine dipstick SMALL (*)    Bilirubin Urine MODERATE (*)    Leukocytes, UA SMALL (*)    All other components  within normal limits  URINE MICROSCOPIC-ADD ON - Abnormal; Notable for the following:    Squamous Epithelial / LPF 0-5 (*)    Bacteria, UA FEW (*)    All other components within normal limits  LIPASE, BLOOD  I-STAT TROPOININ, ED    Imaging Review Dg Chest 2 View  12/11/2014  CLINICAL DATA:  Weakness, chest pain EXAM: CHEST  2 VIEW COMPARISON:  07/15/2009 FINDINGS: Borderline cardiomegaly again noted. No acute infiltrate or pleural effusion. No pulmonary edema. Mild degenerative changes thoracic spine. Atherosclerotic calcifications of thoracic aorta. IMPRESSION: No active cardiopulmonary disease. Borderline cardiomegaly again noted. Electronically Signed   By: Natasha MeadLiviu  Pop M.D.   On: 12/11/2014 12:39   I have personally  reviewed and evaluated these images and lab results as part of my medical decision-making.   EKG Interpretation   Date/Time:  Saturday December 11 2014 12:16:11 EST Ventricular Rate:  101 PR Interval:  174 QRS Duration: 75 QT Interval:  328 QTC Calculation: 425 R Axis:   -3 Text Interpretation:  Sinus tachycardia Low voltage, precordial leads no  acute ischemia No significant change since last tracing Confirmed by  Kandis Mannan ([id number]) on 12/11/2014 12:51:14 PM      MDM   Final diagnoses:  None    Patient is a 59 yo morbidly obese African-American female with past medical history significant for hypertension, chronic pain, diabetes, current smoker. She is reporting a several days of weakness and fatigue. On exam she is a very reassuring physical exam. Her vital signs are sig for tachycardia. She appears well. She able to move all 4 extremities without pain. She's got nothing focal on exam that is abnormal.  We will do screening labs, EKG, chest x-ray, urine. To ensure there is no underlying infection if not we'll have patient continue to hydrate and take care of herself at home.  3:02 PM Labs show anemia. Pt reports that she has had anemia found by PCP.  She denies any loose stools, dark color stools or bleeding elsewhere. Mild AKI. Will treat with 1 L of fluid.  Patient still appears very well. No localized symtpoms.   3:47 PM  Patient's urine grew Trichomonas. This is surprising given patient's age and the fact that she has denied any sex for the last 4 years. We will treat this. Additionally we'll give some pain medication to help her with her chronic leg and back pain. Patient does have evidence of some infection given her white blood cell count however with normal vital signs patient does not meet inpatient requirements. We will treat trichomoniasi.  Patient is able to eat and drink normally so we'll have her stay fluid hydrated home and have her follow-up with her primary care physician on Monday.  Honor Frison Randall An, MD 12/11/14 1549  Shadana Pry Randall An, MD 12/11/14 1611  Chyler Creely Randall An, MD 12/11/14 1659

## 2014-12-11 NOTE — ED Notes (Addendum)
Pt here via EMS from home with c/o generalized body aches, fever, flu-like symptoms x 5 days. Pt reports fatigue, and fever that comes and goes. Pt a/o x 4. NAD.  Last Tylenol at 0400 today.

## 2014-12-11 NOTE — ED Notes (Signed)
Pt became tearful stating "please do not let them send me home tonight. At least keep me overnight so I can have help going to the bathroom." Pt was asked if she thinks she needs home health staff and she reported that she cannot get anything through Medicare.

## 2014-12-11 NOTE — ED Notes (Signed)
RN Efraim KaufmannMelissa and DR Juleen ChinaKohut informed of pt temp 103.1 oral

## 2014-12-11 NOTE — Progress Notes (Signed)
Pt arrived to 2C13 around 2100 from the ED. Pt is hypotensive but asymptomatic, low grade temp of 99.5, NSR and on RA. Pt comlains of generalized pain and localized pain to the right knee. Knee is red/swollen +1 edema. Pt is A/O and up in the chair. RN will continue to monitor.

## 2014-12-12 ENCOUNTER — Other Ambulatory Visit (HOSPITAL_COMMUNITY): Payer: Medicare Other

## 2014-12-12 ENCOUNTER — Inpatient Hospital Stay (HOSPITAL_COMMUNITY): Payer: Medicare HMO

## 2014-12-12 DIAGNOSIS — R6520 Severe sepsis without septic shock: Secondary | ICD-10-CM

## 2014-12-12 DIAGNOSIS — I10 Essential (primary) hypertension: Secondary | ICD-10-CM

## 2014-12-12 DIAGNOSIS — A419 Sepsis, unspecified organism: Principal | ICD-10-CM

## 2014-12-12 DIAGNOSIS — R509 Fever, unspecified: Secondary | ICD-10-CM

## 2014-12-12 DIAGNOSIS — I959 Hypotension, unspecified: Secondary | ICD-10-CM

## 2014-12-12 LAB — CREATININE, SERUM
Creatinine, Ser: 1.17 mg/dL — ABNORMAL HIGH (ref 0.44–1.00)
GFR calc Af Amer: 57 mL/min — ABNORMAL LOW (ref >60)
GFR, EST NON AFRICAN AMERICAN: 50 mL/min — AB (ref >60)

## 2014-12-12 LAB — BRAIN NATRIURETIC PEPTIDE: B NATRIURETIC PEPTIDE 5: 80 pg/mL (ref 0.0–100.0)

## 2014-12-12 LAB — IRON AND TIBC
IRON: 16 ug/dL — AB (ref 28–170)
Saturation Ratios: 8 % — ABNORMAL LOW (ref 10.4–31.8)
TIBC: 206 ug/dL — AB (ref 250–450)
UIBC: 190 ug/dL

## 2014-12-12 LAB — PROCALCITONIN: Procalcitonin: 56.24 ng/mL

## 2014-12-12 LAB — VITAMIN B12: VITAMIN B 12: 209 pg/mL (ref 180–914)

## 2014-12-12 LAB — FIBRINOGEN: Fibrinogen: >800 mg/dL — ABNORMAL HIGH (ref 204–475)

## 2014-12-12 LAB — GLUCOSE, CAPILLARY
GLUCOSE-CAPILLARY: 117 mg/dL — AB (ref 65–99)
GLUCOSE-CAPILLARY: 106 mg/dL — AB (ref 65–99)
GLUCOSE-CAPILLARY: 109 mg/dL — AB (ref 65–99)
GLUCOSE-CAPILLARY: 136 mg/dL — AB (ref 65–99)

## 2014-12-12 LAB — PROTIME-INR
INR: 1.2 (ref 0.00–1.49)
Prothrombin Time: 15.4 seconds — ABNORMAL HIGH (ref 11.6–15.2)

## 2014-12-12 LAB — INFLUENZA PANEL BY PCR (TYPE A & B)
H1N1 flu by pcr: NOT DETECTED
Influenza A By PCR: NEGATIVE
Influenza B By PCR: NEGATIVE

## 2014-12-12 LAB — FERRITIN: Ferritin: 355 ng/mL — ABNORMAL HIGH (ref 11–307)

## 2014-12-12 LAB — URINE CULTURE

## 2014-12-12 LAB — MRSA PCR SCREENING: MRSA BY PCR: NEGATIVE

## 2014-12-12 LAB — LACTATE DEHYDROGENASE: LDH: 172 U/L (ref 98–192)

## 2014-12-12 LAB — APTT: APTT: 32 s (ref 24–37)

## 2014-12-12 MED ORDER — CYANOCOBALAMIN 1000 MCG/ML IJ SOLN
1000.0000 ug | Freq: Every day | INTRAMUSCULAR | Status: DC
  Administered 2014-12-12 – 2014-12-13 (×2): 1000 ug via SUBCUTANEOUS
  Filled 2014-12-12 (×4): qty 1

## 2014-12-12 NOTE — Progress Notes (Signed)
[place] TEAM 1 - Stepdown/ICU TEAM PROGRESS NOTE  Angie Carr:[id number] DOB: 23-Oct-1955 DOA: 12/11/2014 PCP: Geraldo Pitter, MD  Admit HPI / Brief Narrative: 59 y.o. female with Hx of DJD with chronic low back pain, osteoarthritis of bilateral knees, obesity, DM, HLD, and HTN who presented with extreme fatigue, chills, and body aches of 5 days duration. She denied headaches or neck stiffness, and also denied confusion or loss of coordination. She lives alone and 2 days prior had to call EMS to help her off the floor as she was too weak to get up. With persistent symptoms, she decided to come into the ED.  In ED, patient was found tobe febrile to 103.70F with hypotension to 72/41 and sinus tachycardia in the low 100s. She was in no respiratory distress and was saturating mid 90s on room air. Chest x-ray was negative for acute disease and EKG noted sinus tachycardia but otherwise normal. Chem panel returned with acute kidney injury, and CBC noted a leukocytosis to 17,000 with absolute neutrophilia. UA featured small leuks and glucosuria, but not convincing for infection, particularly in the absence of symptoms. Trichomonads were seen in the urine and the patient was treated with Flagyl. She was given empiric doses of azithromycin and Rocephin in the emergency department and after 2 L normal saline bolus, systolic blood pressures remained in the 80s. A third liter normal saline was ordered as a bolus.  HPI/Subjective: The pt is alert and oriented.  She states she feels much better and wishes to go home.  She also asks to go outside to smoke.  She denies cp, sob, n/v, or abdom pain.    Assessment/Plan:  Severe sepsis  -febrile to 103.70F, tachycardic in the low 100s, leukocytosis to 17k -hypotensive to the low 70s systolic -empiric Zosyn and vancomycin  -UA with possible infection, though not entirely convincing - urine cx not helpful  -CXR no active cardiopulmonary disease -f/u  exam and culture data in AM   Hypotension, with history of hypertension -Likely infectious etiology as above -Hold home Norvasc, lisinopril, HCTZ -BP improving - follow   Acute kidney injury -Cr 1.21 on admission, up from 0.7 previously -Suspect hypoperfusion related to severe sepsis, ATN possible -cont to hydrate and f/u in AM   Diabetes mellitus -A1c pending - CBG currently well controlled   Normocytic anemia -Hemoglobin 10.4 on admission, down from 13 previously -No obvious source of bleed -Colonoscopy in February of this year with severe diverticulosis, but otherwise normal -recheck in AM   B12 deficiency  -initiate replacement SQ - will need outpt f/u   Tobacco abuse -advised pt she can not go out to smoke while admitted - offered nicotine patch which she says "won't help at all"  Morbid Obesity - Body mass index is 48.49 kg/(m^2).  Code Status: FULL Family Communication: no family present at time of exam Disposition Plan: SDU   Consultants: PCCM  Procedures: TTE 12/11 - pending   Antibiotics: Azithromycin 12/10 Ceftriaxone 12/10 Metronidazole 12/10 Zosyn 12/10 > Vancomycin 12/10 >  DVT prophylaxis: SQ heparin   Objective: Blood pressure 120/89, pulse 93, temperature 100.6 F (38.1 C), temperature source Oral, resp. rate 15, height  (1.626 m), weight 128.2 kg (282 lb 10.1 oz), SpO2 88 %.  Intake/Output Summary (Last 24 hours) at 12/12/14 0843 Last data filed at 12/12/14 0600  Gross per 24 hour  Intake   2495 ml  Output    300 ml  Net   2195 ml  Exam: General: No acute respiratory distress Lungs: Clear to auscultation bilaterally without wheezes or crackles Cardiovascular: Regular rate and rhythm without murmur gallop or rub normal S1 and S2 Abdomen: Nontender, nondistended, soft, bowel sounds positive, no rebound, no ascites, no appreciable mass Extremities: No significant cyanosis, clubbing, or edema bilateral lower extremities  Data  Reviewed: Basic Metabolic Panel:  Recent Labs Lab 12/11/14 1225 12/11/14 2344  NA 132*  --   K 4.0  --   CL 95*  --   CO2 25  --   GLUCOSE 124*  --   BUN 20  --   CREATININE 1.21* 1.17*  CALCIUM 8.8*  --     CBC:  Recent Labs Lab 12/11/14 1225 12/11/14 2344  WBC 16.8* 14.3*  NEUTROABS 13.6*  --   HGB 10.4* 9.8*  HCT 31.4* 29.4*  MCV 85.1 85.7  PLT 197 155    Liver Function Tests:  Recent Labs Lab 12/11/14 1225  AST 44*  ALT 33  ALKPHOS 84  BILITOT 1.2  PROT 7.3  ALBUMIN 2.9*    Recent Labs Lab 12/11/14 1225  LIPASE 14   Coags:  Recent Labs Lab 12/11/14 2344  INR 1.20    Recent Labs Lab 12/11/14 2344  APTT 32    Recent Results (from the past 240 hour(s))  MRSA PCR Screening     Status: None   Collection Time: 12/11/14 10:41 PM  Result Value Ref Range Status   MRSA by PCR NEGATIVE NEGATIVE Final    Comment:        The GeneXpert MRSA Assay (FDA approved for NASAL specimens only), is one component of a comprehensive MRSA colonization surveillance program. It is not intended to diagnose MRSA infection nor to guide or monitor treatment for MRSA infections.      Studies:   Recent x-ray studies have been reviewed in detail by the Attending Physician  Scheduled Meds:  Scheduled Meds: . aspirin EC  81 mg Oral Daily  . Barium Sulfate  900 mL Oral Once  . Barium Sulfate      . heparin  5,000 Units Subcutaneous 3 times per day  . insulin aspart  0-20 Units Subcutaneous TID WC  . piperacillin-tazobactam (ZOSYN)  IV  3.375 g Intravenous 3 times per day  . pravastatin  20 mg Oral q1800  . sodium chloride  3 mL Intravenous Q12H  . vancomycin  1,500 mg Intravenous Q24H    Time spent on care of this patient: 35 mins   Jahsir Rama T , MD   [facility] Office  [phone] Pager - Text Page per Loretha [email or web address] as per below:  On-Call/Text Page:      [email or web address]      password [email or web address]  If 7PM-7AM, please contact  night-coverage [email or web address] Password [email or web address] 12/12/2014, 8:43 AM   LOS: 1 day

## 2014-12-12 NOTE — Progress Notes (Signed)
PT Cancellation Note  Patient Details Name: Angie RuddleGladys M Carr MRN: [id number] DOB: October 28, 1955   Cancelled Treatment:    Reason Eval/Treat Not Completed: Patient at procedure or test/unavailable Ultrasound has just arrived to room for testing. Will follow-up as schedule allows for PT evaluation.  Berton MountBarbour, Momo Braun S 12/12/2014, 9:00 AM  Charlsie MerlesLogan Secor Cohen Bo[place], [place] [phone]

## 2014-12-12 NOTE — Progress Notes (Signed)
Placed pt on cpap, pt tolerating well at this time.  

## 2014-12-12 NOTE — Progress Notes (Signed)
Name: Angie RuddleGladys M Carr MRN: [id number] DOB: [birth year]    ADMISSION DATE:  12/11/2014 CONSULTATION DATE:  12/11/14  REFERRING MD :  Opyd  REASON FOR CONSULTATION:  Low bp  Patient profile  59 yobf morbidly obese with hbp on amlodipine and ACEi with sev days of fever and aching all over admitted with low bp ? Sepsis?     SIGNIFICANT EVENTS     STUDIES:  Procalcitonin 12/21/14  =  56 U/A 12/11/14 unimpressive   HISTORY OF PRESENT ILLNESS:  59yo AAF who presented to the ED after feeling ill x couple of days. She was admitted to the hospitalist service and started on therapy for UTI, rocephin. Upon further questioning, she states that she has been feeling fine outside of some myalgias in her lower extremities and some urinary issues for which she was taking pyridium as well as cranberry juice. She began having fevers and decided to come in. She states that several people in her building have been ill as of late with viral illnesses.     SUBJECTIVE:  Still aching in both upper thighs/ hips but appetite better, swallowing fine/ no cough or cp or HA     VITAL SIGNS: Temp:  [99.5 F (37.5 C)-103.1 F (39.5 C)] 100.6 F (38.1 C) (12/11 0500) Pulse Rate:  [91-114] 93 (12/11 0700) Resp:  [12-26] 15 (12/11 0700) BP: (72-132)/(41-89) 120/89 mmHg (12/11 0700) SpO2:  [85 %-99 %] 88 % (12/11 0700) Weight:  [272 lb (123.378 kg)-283 lb 11.7 oz (128.7 kg)] 282 lb 10.1 oz (128.2 kg) (12/11 0356)  FIO2  = 4lpm NP  PHYSICAL EXAMINATION: General: 59yo AAF who appears her stated age, is in no acute distress,  Morbidly obese Neuro:   Alert/ no motor deficits   HEENT: orohyx clear  Cardiovascular: S1S2 RRR + 2/6 systolic murmur, no rubs, or gallops auscultated. No thrills palpated.  Lungs:  Chest symmetrical with respirations, clear to auscultation bilaterally with no wheezing, crackles, equal expansion. Difficult to auscultate 2/2 habitus Abdomen:  Soft, obese/ nontender, no guarding,  nondistended. Present bowel sounds. Musculoskeletal:   Mildly tender over prox thighs, can't flex at hips due to pain, not weakness Skin:  No notable scars, rashes, cruises. No bed sores noted.  Lymphatics: no palpable lymphadenopathy of cervical, supraclvicular nor inguinal areas.      Recent Labs Lab 12/11/14 1225 12/11/14 2344  NA 132*  --   K 4.0  --   CL 95*  --   CO2 25  --   BUN 20  --   CREATININE 1.21* 1.17*  GLUCOSE 124*  --     Recent Labs Lab 12/11/14 1225 12/11/14 2344  HGB 10.4* 9.8*  HCT 31.4* 29.4*  WBC 16.8* 14.3*  PLT 197 155   Dg Chest 2 View  12/11/2014  CLINICAL DATA:  Weakness, chest pain EXAM: CHEST  2 VIEW COMPARISON:  07/15/2009 FINDINGS: Borderline cardiomegaly again noted. No acute infiltrate or pleural effusion. No pulmonary edema. Mild degenerative changes thoracic spine. Atherosclerotic calcifications of thoracic aorta. IMPRESSION: No active cardiopulmonary disease. Borderline cardiomegaly again noted. Electronically Signed   By: Natasha MeadLiviu  Pop M.D.   On: 12/11/2014 12:39    ASSESSMENT / PLAN: Sepsis  ? Etiology with very impressive procalcitonin so doubt this is viral  Exaggerated due to hbp meds pre-admit with long washout   - S/p IVF boluses - continue antibiotics. - patient tolerating IVF well. No shortness of breath.  - clinically patient appears much improved, no need  for ccm f/u at this point but clearly need to f/u cultures and track down source of sepsis.  Please call if needed and we will see prn   Sandrea Hughs, MD Pulmonary and Critical Care Medicine [facility] Cell [phone] After 5:30 PM or weekends, call [phone]

## 2014-12-12 NOTE — Progress Notes (Signed)
Pts home meds were brought to pharmacy.

## 2014-12-12 NOTE — Progress Notes (Signed)
  Echocardiogram 2D Echocardiogram has been performed.  Angie SavoyCasey N Keawe Carr 12/12/2014, 9:56 AM

## 2014-12-12 NOTE — Progress Notes (Signed)
Pt has refused cpap at this time.  Rt will continue to monitor. 

## 2014-12-12 NOTE — Progress Notes (Signed)
Pt has refused CBC and CMET for morning labs. Has been stuck multiple times and had a CBC at 0000.

## 2014-12-13 ENCOUNTER — Inpatient Hospital Stay (HOSPITAL_COMMUNITY): Payer: Medicare HMO

## 2014-12-13 LAB — COMPREHENSIVE METABOLIC PANEL
ALT: 27 U/L (ref 14–54)
AST: 25 U/L (ref 15–41)
Albumin: 2.1 g/dL — ABNORMAL LOW (ref 3.5–5.0)
Alkaline Phosphatase: 72 U/L (ref 38–126)
Anion gap: 9 (ref 5–15)
BUN: 8 mg/dL (ref 6–20)
CHLORIDE: 105 mmol/L (ref 101–111)
CO2: 24 mmol/L (ref 22–32)
CREATININE: 0.82 mg/dL (ref 0.44–1.00)
Calcium: 7.8 mg/dL — ABNORMAL LOW (ref 8.9–10.3)
Glucose, Bld: 111 mg/dL — ABNORMAL HIGH (ref 65–99)
POTASSIUM: 3.4 mmol/L — AB (ref 3.5–5.1)
SODIUM: 138 mmol/L (ref 135–145)
Total Bilirubin: 0.9 mg/dL (ref 0.3–1.2)
Total Protein: 5.6 g/dL — ABNORMAL LOW (ref 6.5–8.1)

## 2014-12-13 LAB — GLUCOSE, CAPILLARY
GLUCOSE-CAPILLARY: 121 mg/dL — AB (ref 65–99)
GLUCOSE-CAPILLARY: 116 mg/dL — AB (ref 65–99)
Glucose-Capillary: 107 mg/dL — ABNORMAL HIGH (ref 65–99)

## 2014-12-13 LAB — CK: Total CK: 372 U/L — ABNORMAL HIGH (ref 38–234)

## 2014-12-13 LAB — HEMOGLOBIN A1C
Hgb A1c MFr Bld: 6 % — ABNORMAL HIGH (ref 4.8–5.6)
Mean Plasma Glucose: 126 mg/dL

## 2014-12-13 LAB — CBC
HEMATOCRIT: 29 % — AB (ref 36.0–46.0)
HEMOGLOBIN: 9.5 g/dL — AB (ref 12.0–15.0)
MCH: 28 pg (ref 26.0–34.0)
MCHC: 32.8 g/dL (ref 30.0–36.0)
MCV: 85.5 fL (ref 78.0–100.0)
PLATELETS: 196 10*3/uL (ref 150–400)
RBC: 3.39 MIL/uL — AB (ref 3.87–5.11)
RDW: 16.5 % — ABNORMAL HIGH (ref 11.5–15.5)
WBC: 12.1 10*3/uL — AB (ref 4.0–10.5)

## 2014-12-13 LAB — HAPTOGLOBIN: HAPTOGLOBIN: 528 mg/dL — AB (ref 34–200)

## 2014-12-13 LAB — PROCALCITONIN: PROCALCITONIN: 23.72 ng/mL

## 2014-12-13 MED ORDER — POTASSIUM CHLORIDE CRYS ER 20 MEQ PO TBCR
40.0000 meq | EXTENDED_RELEASE_TABLET | Freq: Once | ORAL | Status: AC
  Administered 2014-12-13: 40 meq via ORAL
  Filled 2014-12-13: qty 2

## 2014-12-13 MED ORDER — VANCOMYCIN HCL IN DEXTROSE 1-5 GM/200ML-% IV SOLN
1000.0000 mg | Freq: Two times a day (BID) | INTRAVENOUS | Status: DC
  Administered 2014-12-13: 1000 mg via INTRAVENOUS
  Filled 2014-12-13: qty 200

## 2014-12-13 MED ORDER — CEFUROXIME AXETIL 500 MG PO TABS
500.0000 mg | ORAL_TABLET | Freq: Two times a day (BID) | ORAL | Status: DC
  Administered 2014-12-13 – 2014-12-14 (×2): 500 mg via ORAL
  Filled 2014-12-13 (×4): qty 1

## 2014-12-13 MED ORDER — BARIUM SULFATE 2.1 % PO SUSP
ORAL | Status: AC
  Administered 2014-12-13: 450 mL
  Filled 2014-12-13: qty 2

## 2014-12-13 MED ORDER — IOHEXOL 300 MG/ML  SOLN
100.0000 mL | Freq: Once | INTRAMUSCULAR | Status: AC | PRN
  Administered 2014-12-13: 100 mL via INTRAVENOUS

## 2014-12-13 MED ORDER — SODIUM CHLORIDE 0.9 % IV SOLN
INTRAVENOUS | Status: DC
  Administered 2014-12-13 – 2014-12-14 (×2): via INTRAVENOUS

## 2014-12-13 NOTE — Progress Notes (Signed)
ANTIBIOTIC CONSULT NOTE - FOLLOW UP  Pharmacy Consult:  Vancomycin / Zosyn Indication:  Sepsis  No Known Allergies  Patient Measurements: Height: 5\' 4"  (162.6 cm) Weight: 283 lb 15.2 oz (128.8 kg) IBW/kg (Calculated) : 54.7  Vital Signs: Temp: 98 F (36.7 C) (12/12 0730) Temp Source: Oral (12/12 0730) BP: 109/68 mmHg (12/12 0730) Pulse Rate: 91 (12/12 0730) Intake/Output from previous day: 12/11 0701 - 12/12 0700 In: 3578 [I.V.:2878; IV Piggyback:700] Out: 3275 [Urine:3275] Intake/Output from this shift: Total I/O In: 3 [I.V.:3] Out: -   Labs:  Recent Labs  12/11/14 1225 12/11/14 2344 12/13/14 0538  WBC 16.8* 14.3* 12.1*  HGB 10.4* 9.8* 9.5*  PLT 197 155 196  CREATININE 1.21* 1.17* 0.82   Estimated Creatinine Clearance: 97.1 mL/min (by C-G formula based on Cr of 0.82). No results for input(s): VANCOTROUGH, VANCOPEAK, VANCORANDOM, GENTTROUGH, GENTPEAK, GENTRANDOM, TOBRATROUGH, TOBRAPEAK, TOBRARND, AMIKACINPEAK, AMIKACINTROU, AMIKACIN in the last 72 hours.   Microbiology: Recent Results (from the past 720 hour(s))  Urine culture     Status: None   Collection Time: 12/11/14 12:50 PM  Result Value Ref Range Status   Specimen Description URINE, CLEAN CATCH  Final   Special Requests NONE  Final   Culture MULTIPLE SPECIES PRESENT, SUGGEST RECOLLECTION  Final   Report Status 12/12/2014 FINAL  Final  MRSA PCR Screening     Status: None   Collection Time: 12/11/14 10:41 PM  Result Value Ref Range Status   MRSA by PCR NEGATIVE NEGATIVE Final    Comment:        The GeneXpert MRSA Assay (FDA approved for NASAL specimens only), is one component of a comprehensive MRSA colonization surveillance program. It is not intended to diagnose MRSA infection nor to guide or monitor treatment for MRSA infections.       Assessment: Angie Carr continues on vancomycin and Zosyn for sepsis.  Patient's renal function is improving.  Vanc 12/10 >> Zosyn 12/10 >> Flagyl 2g x1 on  12/10  12/10 BCx x2 - 12/10 UCx - negative 12/10 MRSA PCR - negative   Goal of Therapy:  Vancomycin trough level 15-20 mcg/ml   Plan:  - Change vanc to 1gm IV Q12H - Zosyn 3.375gm IV Q8H, 4 hr infusion - Monitor renal fxn, clinical progress, vanc trough at C[facility] - Consider resuming home meds - F/U iron supplementation once infection clears    Dov Dill D. Laney Potashang, PharmD, BCPS Pager:  [phone] 12/13/2014, 10:52 AM

## 2014-12-13 NOTE — Progress Notes (Addendum)
[facility] TEAM 1 - Stepdown/ICU TEAM PROGRESS NOTE  Angie RuddleGladys M Carr UJW:[id number] DOB: 08-07-1955 DOA: 12/11/2014 PCP: Geraldo PitterBLAND,VEITA J, MD  Admit HPI / Brief Narrative62: 59 y.o. female with Hx of DJD with chronic low back pain, osteoarthritis of bilateral knees, obesity, DM, HLD, and HTN who presented with extreme fatigue, chills, and body aches of 5 days duration. She denied headaches or neck stiffness, and also denied confusion or loss of coordination. She lives alone and 2 days prior had to call EMS to help her off the floor as she was too weak to get up. With persistent symptoms, she decided to come into the ED.  In ED, patient was found tobe febrile to 103.57F with hypotension to 72/41 and sinus tachycardia in the low 100s. She was in no respiratory distress and was saturating mid 90s on room air. Chest x-ray was negative for acute disease and EKG noted sinus tachycardia but otherwise normal. Chem panel returned with acute kidney injury, and CBC noted a leukocytosis to 17,000 with absolute neutrophilia. UA featured small leuks and glucosuria, but not convincing for infection, particularly in the absence of symptoms. Trichomonads were seen in the urine and the patient was treated with Flagyl. She was given empiric doses of azithromycin and Rocephin in the emergency department and after 2 L normal saline bolus, systolic blood pressures remained in the 80s. A third liter normal saline was ordered as a bolus.  HPI/Subjective: The pt has no complaints whatsoever.  She is anxious to go home.  She denies n/v, sob, abom pain, fevers, or cp.  Assessment/Plan:  Severe sepsis - unknown etiology  -febrile to 103.57F, tachycardic in the low 100s, leukocytosis to 17k -hypotensive to the low 70s systolic at presentation  -empiric Zosyn and vancomycin - transition to ceftin today and follow sx  -UA with possible infection, though not entirely convincing - urine cx not helpful  -CXR no active  cardiopulmonary disease - f/u after hydration remains unimpressive  -check CT abdom/pelvis today to r/o occult intra-abdominal source   Hypotension, with history of hypertension -Likely infectious etiology as above -Hold home Norvasc, lisinopril, HCTZ -BP improving    Acute kidney injury -Cr 1.21 on admission, up from 0.7 previously -Suspect hypoperfusion related to severe sepsis, ATN possible -crt has returned to normal   Diabetes mellitus -A1c still pending - CBG well controlled   Normocytic anemia -Hemoglobin 10.4 on admission - stabilized at ~9.5 w/ hydration  -No obvious source of bleed -Colonoscopy in February of this year with severe diverticulosis, but otherwise normal  B12 deficiency  -initiate replacement SQ - will need transition to oral dosing and f/u   Tobacco abuse -advised pt she can not go out to smoke while admitted - offered nicotine patch which she says "won't help at all"  Mild hypokalemia -Replace and follow  Morbid Obesity - Body mass index is 48.72 kg/(m^2).  Code Status: FULL Family Communication: no family present at time of exam Disposition Plan: stable for transfer to med bed - if CT abdom ok, and pt stable w/ transition to oral abx, may be ready for d/c in AM   Consultants: PCCM  Procedures: TTE 12/11 - pending   Antibiotics: Azithromycin 12/10 Ceftriaxone 12/10 Metronidazole 12/10 Zosyn 12/10 > 12/12 Vancomycin 12/10 > 12/12 Ceftin 12/12 >  DVT prophylaxis: SQ heparin   Objective: Blood pressure 109/68, pulse 91, temperature 98 F (36.7 C), temperature source Oral, resp. rate 20, height 5\' 4"  (1.626 m), weight 128.8 kg (283 lb  15.2 oz), SpO2 92 %.  Intake/Output Summary (Last 24 hours) at 12/13/14 1113 Last data filed at 12/13/14 1610  Gross per 24 hour  Intake   3508 ml  Output   2725 ml  Net    783 ml   Exam: General: No acute respiratory distress - alert and conversant - jovial  Lungs: Clear to auscultation bilaterally  without crackles or wheeze  Cardiovascular: Regular rate and rhythm without murmur gallop or rub  Abdomen: Nontender, obese, soft, bowel sounds positive, no rebound, no ascites, no appreciable mass Extremities: No significant cyanosis, clubbing, edema bilateral lower extremities  Data Reviewed: Basic Metabolic Panel:  Recent Labs Lab 12/11/14 1225 12/11/14 2344 12/13/14 0538  NA 132*  --  138  K 4.0  --  3.4*  CL 95*  --  105  CO2 25  --  24  GLUCOSE 124*  --  111*  BUN 20  --  8  CREATININE 1.21* 1.17* 0.82  CALCIUM 8.8*  --  7.8*    CBC:  Recent Labs Lab 12/11/14 1225 12/11/14 2344 12/13/14 0538  WBC 16.8* 14.3* 12.1*  NEUTROABS 13.6*  --   --   HGB 10.4* 9.8* 9.5*  HCT 31.4* 29.4* 29.0*  MCV 85.1 85.7 85.5  PLT 197 155 196    Liver Function Tests:  Recent Labs Lab 12/11/14 1225 12/13/14 0538  AST 44* 25  ALT 33 27  ALKPHOS 84 72  BILITOT 1.2 0.9  PROT 7.3 5.6*  ALBUMIN 2.9* 2.1*    Recent Labs Lab 12/11/14 1225  LIPASE 14   Coags:  Recent Labs Lab 12/11/14 2344  INR 1.20    Recent Labs Lab 12/11/14 2344  APTT 32    Recent Results (from the past 240 hour(s))  Urine culture     Status: None   Collection Time: 12/11/14 12:50 PM  Result Value Ref Range Status   Specimen Description URINE, CLEAN CATCH  Final   Special Requests NONE  Final   Culture MULTIPLE SPECIES PRESENT, SUGGEST RECOLLECTION  Final   Report Status 12/12/2014 FINAL  Final  MRSA PCR Screening     Status: None   Collection Time: 12/11/14 10:41 PM  Result Value Ref Range Status   MRSA by PCR NEGATIVE NEGATIVE Final    Comment:        The GeneXpert MRSA Assay (FDA approved for NASAL specimens only), is one component of a comprehensive MRSA colonization surveillance program. It is not intended to diagnose MRSA infection nor to guide or monitor treatment for MRSA infections.      Studies:   Recent x-ray studies have been reviewed in detail by the Attending  Physician  Scheduled Meds:  Scheduled Meds: . aspirin EC  81 mg Oral Daily  . Barium Sulfate  900 mL Oral Once  . cyanocobalamin  1,000 mcg Subcutaneous q1800  . heparin  5,000 Units Subcutaneous 3 times per day  . insulin aspart  0-20 Units Subcutaneous TID WC  . piperacillin-tazobactam (ZOSYN)  IV  3.375 g Intravenous 3 times per day  . sodium chloride  3 mL Intravenous Q12H  . vancomycin  1,000 mg Intravenous Q12H    Time spent on care of this patient: 35 mins   MCCLUNG,JEFFREY T , MD   [facility] Office  [phone] Pager - Text Page per Loretha Stapler as per below:  On-Call/Text Page:      [email or web address]      password [email or web address]  If 7PM-7AM, please contact night-coverage [email or web address]  Password [email or web address] 12/13/2014, 11:13 AM   LOS: 2 days

## 2014-12-13 NOTE — Progress Notes (Signed)
Report given to 6N06 at 1415. Patient transferred via bed with belongings, including cell phone, charger, and clothing. CT notified of patient completing contrast.  Noe GensStefanie A Nyajah Hyson, RN

## 2014-12-13 NOTE — Progress Notes (Signed)
Patient forgot cane on 2C. Delivered to patient's room on 6N06.

## 2014-12-13 NOTE — Progress Notes (Signed)
Patient refuses CPAP for tonight. RT will continue to monitor. 

## 2014-12-13 NOTE — Evaluation (Signed)
Physical Therapy Evaluation Patient Details Name: Angie RuddleGladys M Jacobs MRN: [id number] DOB: 1955/07/23 Today's Date: 12/13/2014   History of Present Illness  59 y.o. female with Hx of DJD with chronic low back pain, osteoarthritis of bilateral knees, obesity, DM, HLD, and HTN who presented with extreme fatigue, chills, and body aches of 5 days duration. She denied headaches or neck stiffness, and also denied confusion or loss of coordination. She lives alone and 2 days prior had to call EMS to help her off the floor as she was too weak to get up. With persistent symptoms, she decided to come into the ED.  Pt found to have sepsis.    Clinical Impression  Pt admitted with above diagnosis. Pt currently with functional limitations due to the deficits listed below (see PT Problem List). Overall, pt appears close to baseline.  Will follow to ensure that pt gets up and mobilizes while here.  Pt able to ambulate in hall with cane with sats on RA >90%.  Will follow acutely.  Pt will benefit from skilled PT to increase their independence and safety with mobility to allow discharge to the venue listed below.      Follow Up Recommendations No PT follow up    Equipment Recommendations  None recommended by PT    Recommendations for Other Services       Precautions / Restrictions Precautions Precautions: None Restrictions Weight Bearing Restrictions: No      Mobility  Bed Mobility Overal bed mobility: Independent                Transfers Overall transfer level: Independent                  Ambulation/Gait Ambulation/Gait assistance: Min guard;Supervision Ambulation Distance (Feet): 150 Feet Assistive device: Straight cane Gait Pattern/deviations: Step-to pattern;Decreased stride length;Antalgic;Wide base of support   Gait velocity interpretation: Below normal speed for age/gender General Gait Details: Pt was able to use cane and walk in halls with cane without physical assist  or steadying assist.  Pt appears to be close to baseline.    Stairs            Wheelchair Mobility    Modified Rankin (Stroke Patients Only)       Balance Overall balance assessment: Needs assistance;History of Falls Sitting-balance support: No upper extremity supported;Feet supported Sitting balance-Leahy Scale: Good     Standing balance support: Single extremity supported;During functional activity Standing balance-Leahy Scale: Fair Standing balance comment: Pt can wipe herself without physical assist in standing.                             Pertinent Vitals/Pain Pain Assessment: No/denies pain  VSS    Home Living Family/patient expects to be discharged to:: Private residence Living Arrangements: Non-relatives/Friends (neighbor) Available Help at Discharge: Neighbor;Available PRN/intermittently (states she can call them whenever and they help) Type of Home: House Home Access: Level entry;Elevator     Home Layout: One level Home Equipment: Cane - single point      Prior Function Level of Independence: Independent with assistive device(s)               Hand Dominance        Extremity/Trunk Assessment   Upper Extremity Assessment: Defer to OT evaluation           Lower Extremity Assessment: Overall WFL for tasks assessed      Cervical / Trunk Assessment:  Normal  Communication   Communication: No difficulties  Cognition Arousal/Alertness: Awake/alert Behavior During Therapy: WFL for tasks assessed/performed Overall Cognitive Status: Within Functional Limits for tasks assessed                      General Comments      Exercises General Exercises - Lower Extremity Ankle Circles/Pumps: AROM;Both;10 reps;Seated Long Arc Quad: AROM;Both;10 reps;Seated Hip Flexion/Marching: AROM;Both;10 reps;Seated      Assessment/Plan    PT Assessment Patient needs continued PT services  PT Diagnosis Generalized weakness   PT  Problem List Decreased activity tolerance;Decreased balance;Decreased mobility;Decreased safety awareness;Decreased knowledge of use of DME;Decreased knowledge of precautions  PT Treatment Interventions DME instruction;Gait training;Functional mobility training;Therapeutic activities;Therapeutic exercise;Balance training;Patient/family education   PT Goals (Current goals can be found in the Care Plan section) Acute Rehab PT Goals Patient Stated Goal: to go home PT Goal Formulation: With patient Time For Goal Achievement: 12/20/14 Potential to Achieve Goals: Good    Frequency Min 2X/week   Barriers to discharge        Co-evaluation               End of Session Equipment Utilized During Treatment: Gait belt Activity Tolerance: Patient tolerated treatment well Patient left: with call bell/phone within reach;in bed Nurse Communication: Mobility status         Time: 4098-1191 PT Time Calculation (min) (ACUTE ONLY): 18 min   Charges:   PT Evaluation $Initial PT Evaluation Tier I: 1 Procedure     PT G CodesBerline Lopes 12/17/14, 1:26 PM Adaysha Dubinsky [facility] Acute Rehabilitation [phone] (pager)

## 2014-12-14 LAB — CBC
HCT: 29.4 % — ABNORMAL LOW (ref 36.0–46.0)
Hemoglobin: 9.4 g/dL — ABNORMAL LOW (ref 12.0–15.0)
MCH: 27.7 pg (ref 26.0–34.0)
MCHC: 32 g/dL (ref 30.0–36.0)
MCV: 86.7 fL (ref 78.0–100.0)
Platelets: 270 10*3/uL (ref 150–400)
RBC: 3.39 MIL/uL — ABNORMAL LOW (ref 3.87–5.11)
RDW: 17.1 % — ABNORMAL HIGH (ref 11.5–15.5)
WBC: 9.9 10*3/uL (ref 4.0–10.5)

## 2014-12-14 LAB — COMPREHENSIVE METABOLIC PANEL
ALT: 27 U/L (ref 14–54)
ANION GAP: 7 (ref 5–15)
AST: 24 U/L (ref 15–41)
Albumin: 2.3 g/dL — ABNORMAL LOW (ref 3.5–5.0)
Alkaline Phosphatase: 66 U/L (ref 38–126)
BILIRUBIN TOTAL: 0.7 mg/dL (ref 0.3–1.2)
CALCIUM: 8.3 mg/dL — AB (ref 8.9–10.3)
CO2: 25 mmol/L (ref 22–32)
CREATININE: 0.64 mg/dL (ref 0.44–1.00)
Chloride: 109 mmol/L (ref 101–111)
GFR calc Af Amer: >60 mL/min (ref >60)
GLUCOSE: 116 mg/dL — AB (ref 65–99)
Potassium: 3.4 mmol/L — ABNORMAL LOW (ref 3.5–5.1)
Sodium: 141 mmol/L (ref 135–145)
TOTAL PROTEIN: 5.9 g/dL — AB (ref 6.5–8.1)

## 2014-12-14 LAB — MAGNESIUM: MAGNESIUM: 1.6 mg/dL — AB (ref 1.7–2.4)

## 2014-12-14 LAB — GLUCOSE, CAPILLARY
GLUCOSE-CAPILLARY: 130 mg/dL — AB (ref 65–99)
Glucose-Capillary: 112 mg/dL — ABNORMAL HIGH (ref 65–99)

## 2014-12-14 MED ORDER — POTASSIUM CHLORIDE CRYS ER 20 MEQ PO TBCR
40.0000 meq | EXTENDED_RELEASE_TABLET | Freq: Once | ORAL | Status: AC
  Administered 2014-12-14: 40 meq via ORAL
  Filled 2014-12-14: qty 2

## 2014-12-14 MED ORDER — CEFUROXIME AXETIL 500 MG PO TABS: 500.0000 mg | ORAL_TABLET | Freq: Two times a day (BID) | ORAL | Status: DC

## 2014-12-14 MED ORDER — VITAMIN B-12 1000 MCG PO TABS: 1000.0000 ug | ORAL_TABLET | Freq: Every day | ORAL | Status: DC

## 2014-12-14 MED ORDER — MAGNESIUM SULFATE 2 GM/50ML IV SOLN
2.0000 g | Freq: Once | INTRAVENOUS | Status: AC
  Administered 2014-12-14: 2 g via INTRAVENOUS
  Filled 2014-12-14: qty 50

## 2014-12-14 MED ORDER — METFORMIN HCL 1000 MG PO TABS: 1000.0000 mg | ORAL_TABLET | Freq: Two times a day (BID) | ORAL | Status: DC

## 2014-12-14 NOTE — Progress Notes (Signed)
AVS given to patient. Understanding demonstrated. IV removed after Magnesium administration. Home meds returned to patient. Transportation arranged by patient.

## 2014-12-14 NOTE — Care Management Important Message (Signed)
Important Message  Patient Details  Name: Angie Carr MRN: [id number] Date of Birth: December 24, 1955   Medicare Important Message Given:  Yes    Lachina Salsberry P Florance Paolillo 12/14/2014, 10:12 AM

## 2014-12-14 NOTE — Discharge Instructions (Signed)
Follow with Primary MD Geraldo PitterBLAND,VEITA J, MD in 2-3 days   Get CBC, CMP, 2 view Chest X ray checked  by Primary MD next visit.    Activity: As tolerated with Full fall precautions use walker/cane & assistance as needed   Disposition Home     Diet:   Heart Healthy Low carb.  For Heart failure patients - Check your Weight same time everyday, if you gain over 2 pounds, or you develop in leg swelling, experience more shortness of breath or chest pain, call your Primary MD immediately. Follow Cardiac Low Salt Diet and 1.5 lit/day fluid restriction.   On your next visit with your primary care physician please Get Medicines reviewed and adjusted.   Please request your Prim.MD to go over all Hospital Tests and Procedure/Radiological results at the follow up, please get all Hospital records sent to your Prim MD by signing hospital release before you go home.   If you experience worsening of your admission symptoms, develop shortness of breath, life threatening emergency, suicidal or homicidal thoughts you must seek medical attention immediately by calling 911 or calling your MD immediately  if symptoms less severe.  You Must read complete instructions/literature along with all the possible adverse reactions/side effects for all the Medicines you take and that have been prescribed to you. Take any new Medicines after you have completely understood and accpet all the possible adverse reactions/side effects.   Do not drive, operating heavy machinery, perform activities at heights, swimming or participation in water activities or provide baby sitting services if your were admitted for syncope or siezures until you have seen by Primary MD or a Neurologist and advised to do so again.  Do not drive when taking Pain medications.    Do not take more than prescribed Pain, Sleep and Anxiety Medications  Special Instructions: If you have smoked or chewed Tobacco  in the last 2 yrs please stop smoking, stop  any regular Alcohol  and or any Recreational drug use.  Wear Seat belts while driving.   Please note  You were cared for by a hospitalist during your hospital stay. If you have any questions about your discharge medications or the care you received while you were in the hospital after you are discharged, you can call the unit and asked to speak with the hospitalist on call if the hospitalist that took care of you is not available. Once you are discharged, your primary care physician will handle any further medical issues. Please note that NO REFILLS for any discharge medications will be authorized once you are discharged, as it is imperative that you return to your primary care physician (or establish a relationship with a primary care physician if you do not have one) for your aftercare needs so that they can reassess your need for medications and monitor your lab values.

## 2014-12-14 NOTE — Discharge Summary (Signed)
Angie RuddleGladys M Carr, is a 59 y.o. female  DOB 1955-04-01  MRN [id number].  Admission date:  12/11/2014  Admitting Physician  Briscoe Deutscherimothy S Opyd, MD  Discharge Date:  12/14/2014   Primary MD  Geraldo PitterBLAND,VEITA J, MD  Recommendations for primary care physician for things to follow:   Check CBC BMP and magnesium levels in 2-3 days. Monitor anemia and B-12 levels.   Admission Diagnosis  Trichomonal vaginitis [A59.01] Febrile illness [R50.9]   Discharge Diagnosis  Trichomonal vaginitis [A59.01] Febrile illness [R50.9]     Principal Problem:   Severe sepsis (HCC) Active Problems:   Diabetes mellitus, type 2 (HCC)   Febrile illness   Hypotension   Acute kidney injury (HCC)   Trichomonal vaginitis   Absolute anemia      Past Medical History  Diagnosis Date  . Hypertension   . Morbid obesity (HCC)   . LBP (low back pain)   . Primary osteoarthritis of both knees   . Smoker   . Diabetes mellitus without complication (HCC)   . Asthma     pt denies  . Headache     miagraines, none in years  . DJD (degenerative joint disease)     lower back    Past Surgical History  Procedure Laterality Date  . Tubal ligation    . Abdominal hysterectomy      complete  . Cholecystectomy    . Hernia repair    . Colonoscopy N/A 02/11/2014    Procedure: COLONOSCOPY;  Surgeon: Florencia Reasonsobert Buccini V, MD;  Location: [facility];  Service: Endoscopy;  Laterality: N/A;       HPI :    59 y.o. female with Hx of DJD with chronic low back pain, osteoarthritis of bilateral knees, obesity, DM, HLD, and HTN who presented with extreme fatigue, chills, and body aches of 5 days duration. She denied headaches or neck stiffness, and also denied confusion or loss of coordination. She lives alone and 2 days prior had to call EMS to help her off the  floor as she was too weak to get up. With persistent symptoms, she decided to come into the ED.  In ED, patient was found tobe febrile to 103.84F with hypotension to 72/41 and sinus tachycardia in the low 100s. She was in no respiratory distress and was saturating mid 90s on room air. Chest x-ray was negative for acute disease and EKG noted sinus tachycardia but otherwise normal. Chem panel returned with acute kidney injury, and CBC noted a leukocytosis to 17,000 with absolute neutrophilia. UA featured small leuks and glucosuria, but not convincing for infection, particularly in the absence of symptoms. Trichomonads were seen in the urine and the patient was treated with Flagyl. She was given empiric doses of azithromycin and Rocephin in the emergency department and after 2 L normal saline bolus, systolic blood pressures remained in the 80s. A third liter normal saline was ordered as a bolus.     Hospital Course:    1. Sepsis due to left-sided pyelonephritis evident on  CT scan. Treated with appropriate IV antibiotics, unfortunately cultures are unrevealing, she has now been transitioned to oral Ceftin with good effect, we will place her on 10 more days of oral Ceftin with outpatient PCP follow-up. During her initial visit in the ER her UA was positive for Trichomonas which she has been treated with Flagyl in the ER itself. Her sepsis physiology with low blood pressure, ARF and leukocytosis has resolved with supportive care and antibiotics.   2. Essential hypertension. Resume home medications upon discharge.   3. Anemia of chronic disease, new diagnosis of B-12 deficiency. Outpatient follow-up, place on B-12 supplementation, request PCP to check B-12 levels in 4-6 weeks.   4. History of smoking. Counseled to quit.   5. DM type II. On Glucophage requested to resume Glucophage from the 15th of this month as she received IV contrast for CT scan.   6. Morbid obesity. Outpatient follow-up with  PCP.    Discharge Condition: Stable  Follow UP  Follow-up Information    Follow up with Geraldo Pitter, MD. Schedule an appointment as soon as possible for a visit in 2 days.   Specialty:  Family Medicine   Contact information:   [address] [place] [address] [phone]        Consults obtained - PCCM  Diet and Activity recommendation: See Discharge Instructions below  Discharge Instructions           Discharge Instructions    Discharge instructions    Complete by:  As directed   Follow with Primary MD Geraldo Pitter, MD in 2-3 days   Get CBC, CMP, 2 view Chest X ray checked  by Primary MD next visit.    Activity: As tolerated with Full fall precautions use walker/cane & assistance as needed   Disposition Home     Diet:   Heart Healthy Low carb.  For Heart failure patients - Check your Weight same time everyday, if you gain over 2 pounds, or you develop in leg swelling, experience more shortness of breath or chest pain, call your Primary MD immediately. Follow Cardiac Low Salt Diet and 1.5 lit/day fluid restriction.   On your next visit with your primary care physician please Get Medicines reviewed and adjusted.   Please request your Prim.MD to go over all Hospital Tests and Procedure/Radiological results at the follow up, please get all Hospital records sent to your Prim MD by signing hospital release before you go home.   If you experience worsening of your admission symptoms, develop shortness of breath, life threatening emergency, suicidal or homicidal thoughts you must seek medical attention immediately by calling 911 or calling your MD immediately  if symptoms less severe.  You Must read complete instructions/literature along with all the possible adverse reactions/side effects for all the Medicines you take and that have been prescribed to you. Take any new Medicines after you have completely understood and accpet all the possible adverse  reactions/side effects.   Do not drive, operating heavy machinery, perform activities at heights, swimming or participation in water activities or provide baby sitting services if your were admitted for syncope or siezures until you have seen by Primary MD or a Neurologist and advised to do so again.  Do not drive when taking Pain medications.    Do not take more than prescribed Pain, Sleep and Anxiety Medications  Special Instructions: If you have smoked or chewed Tobacco  in the last 2 yrs please stop smoking, stop any  regular Alcohol  and or any Recreational drug use.  Wear Seat belts while driving.   Please note  You were cared for by a hospitalist during your hospital stay. If you have any questions about your discharge medications or the care you received while you were in the hospital after you are discharged, you can call the unit and asked to speak with the hospitalist on call if the hospitalist that took care of you is not available. Once you are discharged, your primary care physician will handle any further medical issues. Please note that NO REFILLS for any discharge medications will be authorized once you are discharged, as it is imperative that you return to your primary care physician (or establish a relationship with a primary care physician if you do not have one) for your aftercare needs so that they can reassess your need for medications and monitor your lab values.     Increase activity slowly    Complete by:  As directed              Discharge Medications       Medication List    TAKE these medications        amLODipine 10 MG tablet  Commonly known as:  NORVASC  TAKE 1 TABLET DAILY     aspirin 81 MG tablet  Take 81 mg by mouth daily.     cefUROXime 500 MG tablet  Commonly known as:  CEFTIN  Take 1 tablet (500 mg total) by mouth 2 (two) times daily with a meal.     etodolac 500 MG tablet  Commonly known as:  LODINE  Take 1 tablet (500 mg total) by  mouth 2 (two) times daily.     etodolac 500 MG tablet  Commonly known as:  LODINE  TAKE 1 TABLET BY MOUTH TWICE DAILY     GLYXAMBI 25-5 MG Tabs  Generic drug:  Empagliflozin-Linagliptin  Take 1 tablet by mouth daily.     lisinopril-hydrochlorothiazide 20-12.5 MG tablet  Commonly known as:  PRINZIDE,ZESTORETIC  TAKE 1 TABLET TWICE A DAY     metFORMIN 1000 MG tablet  Commonly known as:  GLUCOPHAGE  Take 1 tablet (1,000 mg total) by mouth 2 (two) times daily with a meal.  Start taking on:  12/16/2014     omeprazole 20 MG capsule  Commonly known as:  PRILOSEC  TAKE 1 CAPSULE DAILY     oxyCODONE-acetaminophen 5-325 MG tablet  Commonly known as:  PERCOCET/ROXICET  Take 1 tablet by mouth every 6 (six) hours as needed for severe pain.     potassium chloride SA 20 MEQ tablet  Commonly known as:  K-DUR,KLOR-CON  Take 20 mEq by mouth daily.     pravastatin 20 MG tablet  Commonly known as:  PRAVACHOL  TAKE 1 TABLET DAILY     vitamin B-12 1000 MCG tablet  Commonly known as:  CYANOCOBALAMIN  Take 1 tablet (1,000 mcg total) by mouth daily.        Major procedures and Radiology Reports - PLEASE review detailed and final reports for all details, in brief -   TTE  Left ventricle: The cavity size was normal. There was focal basalhypertrophy. Systolic function was vigorous. The estimatedejection fraction was in the range of 65% to 70%. The hyperdynamic ventricle and the moderate focal basal septalhypterophy due create a dyanmic intracavitary gradient. Dopplerimages are not adequate to measure gradient, however it appearsfairly mild. Wall motion was normal; there were no regional wallmotion abnormalities. Doppler  parameters are consistent withabnormal left ventricular relaxation (grade 1 diastolicdysfunction). - Aortic valve: Mildly calcified annulus. Trileaflet; mildlythickened leaflets. Valve area (VTI): 2.74 cm^2. Valve area(Vmax): 2.83 cm^2. - Left atrium: The atrium was  mildly dilated. - Right atrium: The atrium was normal in size. There is a partiallymobile linear structure in the superior portion of the rightatrium. Probable prominent eustachian valve. - Pulmonary arteries: Systolic pressure was moderately increased.PA peak pressure: 45 mm Hg (S). - Technically difficult study.  Dg Chest 2 View  12/11/2014  CLINICAL DATA:  Weakness, chest pain EXAM: CHEST  2 VIEW COMPARISON:  07/15/2009 FINDINGS: Borderline cardiomegaly again noted. No acute infiltrate or pleural effusion. No pulmonary edema. Mild degenerative changes thoracic spine. Atherosclerotic calcifications of thoracic aorta. IMPRESSION: No active cardiopulmonary disease. Borderline cardiomegaly again noted. Electronically Signed   By: Natasha Mead M.D.   On: 12/11/2014 12:39   Ct Abdomen Pelvis W Contrast  12/13/2014  CLINICAL DATA:  Fever of unknown origin. EXAM: CT ABDOMEN AND PELVIS WITH CONTRAST TECHNIQUE: Multidetector CT imaging of the abdomen and pelvis was performed using the standard protocol following bolus administration of intravenous contrast. CONTRAST:  OMNIPAQUE IOHEXOL 300 MG/ML  SOLN COMPARISON:  None. FINDINGS: Contrast bolus opacification with CT imaging is suboptimal. This limits parenchymal evaluation of the solid organs. However, there does appear to be asymmetric edema of the left kidney compared to the right with associated mild stranding in left perinephric fat. Findings are suggestive of pyelonephritis. Correlation suggested with urinalysis. No hydronephrosis or renal calculi identified. The right kidney appears normal. The liver, spleen, pancreas, adrenal glands and bowel appear unremarkable. No evidence of bowel obstruction, free air or free fluid. No focal abscess is identified. The gallbladder is been removed. No biliary ductal dilatation is identified. The abdominal aorta and iliac arteries are calcified without evidence of aneurysm. There is a focal midline ventral hernia  defect just superior to the umbilicus containing fat. Bony structures are unremarkable. IMPRESSION: 1. CT findings suggestive of left-sided pyelonephritis. Perfusion of the left kidney is not optimally assessed due to relatively poor contrast bolus injection. There is no evidence of hydronephrosis or obstructing left-sided calculus. 2. No focal abscess identified. 3. Focal midline ventral hernia containing fat. Electronically Signed   By: Irish Lack M.D.   On: 12/13/2014 18:57   Dg Chest Port 1 View  12/13/2014  CLINICAL DATA:  Sepsis EXAM: PORTABLE CHEST 1 VIEW COMPARISON:  12/11/2014 FINDINGS: Mild cardiac enlargement. Mild pulmonary vascular congestion without edema. Negative for pneumonia or effusion. IMPRESSION: Cardiac and enlargement with mild vascular congestion which has progressed since the prior study. Negative for edema. Electronically Signed   By: Marlan Palau M.D.   On: 12/13/2014 07:03    Micro Results      Recent Results (from the past 240 hour(s))  Urine culture     Status: None   Collection Time: 12/11/14 12:50 PM  Result Value Ref Range Status   Specimen Description URINE, CLEAN CATCH  Final   Special Requests NONE  Final   Culture MULTIPLE SPECIES PRESENT, SUGGEST RECOLLECTION  Final   Report Status 12/12/2014 FINAL  Final  Blood culture (routine x 2)     Status: None (Preliminary result)   Collection Time: 12/11/14  7:50 PM  Result Value Ref Range Status   Specimen Description BLOOD RIGHT HAND  Final   Special Requests BOTTLES DRAWN AEROBIC AND ANAEROBIC 5CC  Final   Culture NO GROWTH 2 DAYS  Final   Report Status  PENDING  Incomplete  Blood culture (routine x 2)     Status: None (Preliminary result)   Collection Time: 12/11/14  8:04 PM  Result Value Ref Range Status   Specimen Description BLOOD RIGHT HAND  Final   Special Requests BOTTLES DRAWN AEROBIC ONLY 2CC  Final   Culture NO GROWTH 2 DAYS  Final   Report Status PENDING  Incomplete  MRSA PCR Screening      Status: None   Collection Time: 12/11/14 10:41 PM  Result Value Ref Range Status   MRSA by PCR NEGATIVE NEGATIVE Final    Comment:        The GeneXpert MRSA Assay (FDA approved for NASAL specimens only), is one component of a comprehensive MRSA colonization surveillance program. It is not intended to diagnose MRSA infection nor to guide or monitor treatment for MRSA infections.        Today   Subjective    Angie Carr today has no headache,no chest abdominal pain,no new weakness tingling or numbness, feels much better wants to go home today.    Objective   Blood pressure 135/73, pulse 89, temperature 98.3 F (36.8 C), temperature source Oral, resp. rate 17, height 5\' 4"  (1.626 m), weight 129.2 kg (284 lb 13.4 oz), SpO2 93 %.   Intake/Output Summary (Last 24 hours) at 12/14/14 0928 Last data filed at 12/14/14 0630  Gross per 24 hour  Intake 2761.75 ml  Output      0 ml  Net 2761.75 ml    Exam Awake Alert, Oriented x 3, No new F.N deficits, Normal affect [place].AT,PERRAL Supple Neck,No JVD, No cervical lymphadenopathy appriciated.  Symmetrical Chest wall movement, Good air movement bilaterally, CTAB RRR,No Gallops,Rubs or new Murmurs, No Parasternal Heave +ve B.Sounds, Abd Soft, Non tender, No organomegaly appriciated, No rebound -guarding or rigidity. No Cyanosis, Clubbing or edema, No new Rash or bruise   Data Review   CBC w Diff:  Lab Results  Component Value Date   WBC 9.9 12/14/2014   HGB 9.4* 12/14/2014   HCT 29.4* 12/14/2014   HCT 31.3* 12/11/2014   PLT 270 12/14/2014   LYMPHOPCT 10 12/11/2014   MONOPCT 8 12/11/2014   EOSPCT 0 12/11/2014   BASOPCT 0 12/11/2014    CMP:  Lab Results  Component Value Date   NA 141 12/14/2014   K 3.4* 12/14/2014   CL 109 12/14/2014   CO2 25 12/14/2014   BUN <5* 12/14/2014   CREATININE 0.64 12/14/2014   CREATININE 0.69 11/04/2012   PROT 5.9* 12/14/2014   ALBUMIN 2.3* 12/14/2014   BILITOT 0.7  12/14/2014   ALKPHOS 66 12/14/2014   AST 24 12/14/2014   ALT 27 12/14/2014  .   Total Time in preparing paper work, data evaluation and todays exam - 35 minutes  Leroy Sea M.D on 12/14/2014 at 9:28 AM  [facility]   Office  [phone]

## 2014-12-15 LAB — FOLATE RBC
Folate, Hemolysate: 430.5 ng/mL
Folate, RBC: 1375 ng/mL (ref >498)
HEMATOCRIT: 31.3 % — AB (ref 34.0–46.6)

## 2014-12-15 LAB — HEMATOLOGY COMMENTS:

## 2014-12-16 LAB — CULTURE, BLOOD (ROUTINE X 2)
Culture: NO GROWTH
Culture: NO GROWTH

## 2015-01-17 DIAGNOSIS — E0859 Diabetes mellitus due to underlying condition with other circulatory complications: Secondary | ICD-10-CM | POA: Diagnosis not present

## 2015-01-17 DIAGNOSIS — T65291A Toxic effect of other tobacco and nicotine, accidental (unintentional), initial encounter: Secondary | ICD-10-CM | POA: Diagnosis not present

## 2015-01-17 DIAGNOSIS — I10 Essential (primary) hypertension: Secondary | ICD-10-CM | POA: Diagnosis not present

## 2015-02-16 DIAGNOSIS — M7731 Calcaneal spur, right foot: Secondary | ICD-10-CM | POA: Diagnosis not present

## 2015-02-16 DIAGNOSIS — E089 Diabetes mellitus due to underlying condition without complications: Secondary | ICD-10-CM | POA: Diagnosis not present

## 2015-02-16 DIAGNOSIS — I10 Essential (primary) hypertension: Secondary | ICD-10-CM | POA: Diagnosis not present

## 2015-02-16 DIAGNOSIS — E785 Hyperlipidemia, unspecified: Secondary | ICD-10-CM | POA: Diagnosis not present

## 2015-03-03 DIAGNOSIS — M216X1 Other acquired deformities of right foot: Secondary | ICD-10-CM | POA: Diagnosis not present

## 2015-03-03 DIAGNOSIS — M7662 Achilles tendinitis, left leg: Secondary | ICD-10-CM | POA: Diagnosis not present

## 2015-03-03 DIAGNOSIS — Z780 Asymptomatic menopausal state: Secondary | ICD-10-CM | POA: Diagnosis not present

## 2015-03-03 DIAGNOSIS — E119 Type 2 diabetes mellitus without complications: Secondary | ICD-10-CM | POA: Diagnosis not present

## 2015-03-03 DIAGNOSIS — M722 Plantar fascial fibromatosis: Secondary | ICD-10-CM | POA: Diagnosis not present

## 2015-03-03 DIAGNOSIS — M7661 Achilles tendinitis, right leg: Secondary | ICD-10-CM | POA: Diagnosis not present

## 2015-03-03 DIAGNOSIS — M216X2 Other acquired deformities of left foot: Secondary | ICD-10-CM | POA: Diagnosis not present

## 2015-05-09 DIAGNOSIS — N10 Acute tubulo-interstitial nephritis: Secondary | ICD-10-CM | POA: Diagnosis not present

## 2015-05-09 DIAGNOSIS — I10 Essential (primary) hypertension: Secondary | ICD-10-CM | POA: Diagnosis not present

## 2015-05-09 DIAGNOSIS — E0859 Diabetes mellitus due to underlying condition with other circulatory complications: Secondary | ICD-10-CM | POA: Diagnosis not present

## 2015-05-16 DIAGNOSIS — M7731 Calcaneal spur, right foot: Secondary | ICD-10-CM | POA: Diagnosis not present

## 2015-05-16 DIAGNOSIS — M130 Polyarthritis, unspecified: Secondary | ICD-10-CM | POA: Diagnosis not present

## 2015-05-16 DIAGNOSIS — E089 Diabetes mellitus due to underlying condition without complications: Secondary | ICD-10-CM | POA: Diagnosis not present

## 2015-05-16 DIAGNOSIS — I10 Essential (primary) hypertension: Secondary | ICD-10-CM | POA: Diagnosis not present

## 2015-08-12 DIAGNOSIS — T65291A Toxic effect of other tobacco and nicotine, accidental (unintentional), initial encounter: Secondary | ICD-10-CM | POA: Diagnosis not present

## 2015-08-12 DIAGNOSIS — Z79899 Other long term (current) drug therapy: Secondary | ICD-10-CM | POA: Diagnosis not present

## 2015-08-12 DIAGNOSIS — E089 Diabetes mellitus due to underlying condition without complications: Secondary | ICD-10-CM | POA: Diagnosis not present

## 2015-08-12 DIAGNOSIS — M130 Polyarthritis, unspecified: Secondary | ICD-10-CM | POA: Diagnosis not present

## 2015-08-12 DIAGNOSIS — I10 Essential (primary) hypertension: Secondary | ICD-10-CM | POA: Diagnosis not present

## 2015-08-12 DIAGNOSIS — M7731 Calcaneal spur, right foot: Secondary | ICD-10-CM | POA: Diagnosis not present

## 2015-08-15 ENCOUNTER — Other Ambulatory Visit: Payer: Self-pay | Admitting: Pharmacist

## 2015-08-15 NOTE — Patient Outreach (Signed)
Outreach call to Angie Carr regarding her request for follow [facility]. Called and spoke with patient. HIPAA identifiers verified and verbal consent received.  Patient reports that she is out of refills on irbesartan and amlodipine, needs refills from Dr. Parke SimmersBland. Reports that she often runs out because Dr. Parke SimmersBland only gives 3 months of refills at a time and sometimes she runs out before her next 3 month visit. Counseled patient about communicating with pharmacy and requesting pharmacy to fax MD when out of refills.  Let patient know that I will call her pharmacy and then follow up with her.  Duanne MoronElisabeth Aveer Bartow, PharmD Clinical Pharmacist [facility] [phone]

## 2015-08-15 NOTE — Patient Outreach (Signed)
[facility] [phone]). Per pharmacist, patient has refills of amlodipine (too soon to fill), but not irbesartan. Request that pharmacy send a fax to the patient's PCP for irbesartan refill.  Duanne MoronElisabeth Valyn Latchford, PharmD Clinical Pharmacist [facility] [phone]

## 2015-08-15 NOTE — Patient Outreach (Signed)
Call Dr. Tedra SenegalBland's office and speak with Wandra Mannanameka, who reports that Dr. Parke SimmersBland will only authorize 3 months of refills at a time and that she will reply to the refill request once received from the pharmacy.  Duanne MoronElisabeth Genita Nilsson, PharmD Clinical Pharmacist [facility] [phone]

## 2015-08-15 NOTE — Patient Outreach (Signed)
Follow up with patient. Remind patient of importance of requesting fax to MD when low on medication, ahead of time. Patient reports that she [facility] her [facility], but not these. Patient reports that she has an upcoming appointment with Dr. Parke SimmersBland in August. Advise that she request that Dr. Parke SimmersBland [facility] at that time for cost savings. Patient states that she will. Patient reports that she has Extra Help for prescriptions.  Patient reports that she has no further medication questions for me at this time.  Duanne MoronElisabeth Trease Bremner, PharmD Clinical Pharmacist [facility] [phone]

## 2015-08-22 DIAGNOSIS — M25511 Pain in right shoulder: Secondary | ICD-10-CM | POA: Diagnosis not present

## 2015-08-22 DIAGNOSIS — M25512 Pain in left shoulder: Secondary | ICD-10-CM | POA: Diagnosis not present

## 2015-08-24 DIAGNOSIS — M25511 Pain in right shoulder: Secondary | ICD-10-CM | POA: Diagnosis not present

## 2015-08-24 DIAGNOSIS — M25512 Pain in left shoulder: Secondary | ICD-10-CM | POA: Diagnosis not present

## 2015-09-07 DIAGNOSIS — M25512 Pain in left shoulder: Secondary | ICD-10-CM | POA: Diagnosis not present

## 2015-09-07 DIAGNOSIS — M25511 Pain in right shoulder: Secondary | ICD-10-CM | POA: Diagnosis not present

## 2015-09-14 DIAGNOSIS — M25511 Pain in right shoulder: Secondary | ICD-10-CM | POA: Diagnosis not present

## 2015-09-14 DIAGNOSIS — M25512 Pain in left shoulder: Secondary | ICD-10-CM | POA: Diagnosis not present

## 2015-09-26 DIAGNOSIS — M25511 Pain in right shoulder: Secondary | ICD-10-CM | POA: Diagnosis not present

## 2015-09-26 DIAGNOSIS — M25519 Pain in unspecified shoulder: Secondary | ICD-10-CM | POA: Diagnosis not present

## 2015-09-26 DIAGNOSIS — M25512 Pain in left shoulder: Secondary | ICD-10-CM | POA: Diagnosis not present

## 2015-09-27 DIAGNOSIS — M549 Dorsalgia, unspecified: Secondary | ICD-10-CM | POA: Diagnosis not present

## 2015-09-27 DIAGNOSIS — Z87891 Personal history of nicotine dependence: Secondary | ICD-10-CM | POA: Diagnosis not present

## 2015-09-27 DIAGNOSIS — E119 Type 2 diabetes mellitus without complications: Secondary | ICD-10-CM | POA: Diagnosis not present

## 2015-09-27 DIAGNOSIS — Z7984 Long term (current) use of oral hypoglycemic drugs: Secondary | ICD-10-CM | POA: Diagnosis not present

## 2015-09-27 DIAGNOSIS — Z7982 Long term (current) use of aspirin: Secondary | ICD-10-CM | POA: Diagnosis not present

## 2015-09-27 DIAGNOSIS — Z6841 Body Mass Index (BMI) 40.0 and over, adult: Secondary | ICD-10-CM | POA: Diagnosis not present

## 2015-09-30 DIAGNOSIS — Z87891 Personal history of nicotine dependence: Secondary | ICD-10-CM | POA: Diagnosis not present

## 2015-09-30 DIAGNOSIS — Z7982 Long term (current) use of aspirin: Secondary | ICD-10-CM | POA: Diagnosis not present

## 2015-09-30 DIAGNOSIS — E119 Type 2 diabetes mellitus without complications: Secondary | ICD-10-CM | POA: Diagnosis not present

## 2015-09-30 DIAGNOSIS — M549 Dorsalgia, unspecified: Secondary | ICD-10-CM | POA: Diagnosis not present

## 2015-09-30 DIAGNOSIS — Z7984 Long term (current) use of oral hypoglycemic drugs: Secondary | ICD-10-CM | POA: Diagnosis not present

## 2015-09-30 DIAGNOSIS — Z6841 Body Mass Index (BMI) 40.0 and over, adult: Secondary | ICD-10-CM | POA: Diagnosis not present

## 2015-10-04 DIAGNOSIS — E119 Type 2 diabetes mellitus without complications: Secondary | ICD-10-CM | POA: Diagnosis not present

## 2015-10-04 DIAGNOSIS — M549 Dorsalgia, unspecified: Secondary | ICD-10-CM | POA: Diagnosis not present

## 2015-10-04 DIAGNOSIS — Z7984 Long term (current) use of oral hypoglycemic drugs: Secondary | ICD-10-CM | POA: Diagnosis not present

## 2015-10-04 DIAGNOSIS — Z87891 Personal history of nicotine dependence: Secondary | ICD-10-CM | POA: Diagnosis not present

## 2015-10-04 DIAGNOSIS — Z7982 Long term (current) use of aspirin: Secondary | ICD-10-CM | POA: Diagnosis not present

## 2015-10-04 DIAGNOSIS — Z6841 Body Mass Index (BMI) 40.0 and over, adult: Secondary | ICD-10-CM | POA: Diagnosis not present

## 2015-10-05 DIAGNOSIS — Z6841 Body Mass Index (BMI) 40.0 and over, adult: Secondary | ICD-10-CM | POA: Diagnosis not present

## 2015-10-05 DIAGNOSIS — Z7984 Long term (current) use of oral hypoglycemic drugs: Secondary | ICD-10-CM | POA: Diagnosis not present

## 2015-10-05 DIAGNOSIS — M549 Dorsalgia, unspecified: Secondary | ICD-10-CM | POA: Diagnosis not present

## 2015-10-05 DIAGNOSIS — Z7982 Long term (current) use of aspirin: Secondary | ICD-10-CM | POA: Diagnosis not present

## 2015-10-05 DIAGNOSIS — E119 Type 2 diabetes mellitus without complications: Secondary | ICD-10-CM | POA: Diagnosis not present

## 2015-10-05 DIAGNOSIS — Z87891 Personal history of nicotine dependence: Secondary | ICD-10-CM | POA: Diagnosis not present

## 2015-10-07 DIAGNOSIS — Z7982 Long term (current) use of aspirin: Secondary | ICD-10-CM | POA: Diagnosis not present

## 2015-10-07 DIAGNOSIS — Z87891 Personal history of nicotine dependence: Secondary | ICD-10-CM | POA: Diagnosis not present

## 2015-10-07 DIAGNOSIS — Z7984 Long term (current) use of oral hypoglycemic drugs: Secondary | ICD-10-CM | POA: Diagnosis not present

## 2015-10-07 DIAGNOSIS — M549 Dorsalgia, unspecified: Secondary | ICD-10-CM | POA: Diagnosis not present

## 2015-10-07 DIAGNOSIS — Z6841 Body Mass Index (BMI) 40.0 and over, adult: Secondary | ICD-10-CM | POA: Diagnosis not present

## 2015-10-07 DIAGNOSIS — E119 Type 2 diabetes mellitus without complications: Secondary | ICD-10-CM | POA: Diagnosis not present

## 2015-10-11 DIAGNOSIS — Z7984 Long term (current) use of oral hypoglycemic drugs: Secondary | ICD-10-CM | POA: Diagnosis not present

## 2015-10-11 DIAGNOSIS — Z7982 Long term (current) use of aspirin: Secondary | ICD-10-CM | POA: Diagnosis not present

## 2015-10-11 DIAGNOSIS — Z6841 Body Mass Index (BMI) 40.0 and over, adult: Secondary | ICD-10-CM | POA: Diagnosis not present

## 2015-10-11 DIAGNOSIS — M549 Dorsalgia, unspecified: Secondary | ICD-10-CM | POA: Diagnosis not present

## 2015-10-11 DIAGNOSIS — Z87891 Personal history of nicotine dependence: Secondary | ICD-10-CM | POA: Diagnosis not present

## 2015-10-11 DIAGNOSIS — E119 Type 2 diabetes mellitus without complications: Secondary | ICD-10-CM | POA: Diagnosis not present

## 2015-10-13 DIAGNOSIS — Z6841 Body Mass Index (BMI) 40.0 and over, adult: Secondary | ICD-10-CM | POA: Diagnosis not present

## 2015-10-13 DIAGNOSIS — Z87891 Personal history of nicotine dependence: Secondary | ICD-10-CM | POA: Diagnosis not present

## 2015-10-13 DIAGNOSIS — M549 Dorsalgia, unspecified: Secondary | ICD-10-CM | POA: Diagnosis not present

## 2015-10-13 DIAGNOSIS — E119 Type 2 diabetes mellitus without complications: Secondary | ICD-10-CM | POA: Diagnosis not present

## 2015-10-13 DIAGNOSIS — Z7984 Long term (current) use of oral hypoglycemic drugs: Secondary | ICD-10-CM | POA: Diagnosis not present

## 2015-10-13 DIAGNOSIS — Z7982 Long term (current) use of aspirin: Secondary | ICD-10-CM | POA: Diagnosis not present

## 2015-10-18 DIAGNOSIS — M549 Dorsalgia, unspecified: Secondary | ICD-10-CM | POA: Diagnosis not present

## 2015-10-18 DIAGNOSIS — Z87891 Personal history of nicotine dependence: Secondary | ICD-10-CM | POA: Diagnosis not present

## 2015-10-18 DIAGNOSIS — Z6841 Body Mass Index (BMI) 40.0 and over, adult: Secondary | ICD-10-CM | POA: Diagnosis not present

## 2015-10-18 DIAGNOSIS — Z7984 Long term (current) use of oral hypoglycemic drugs: Secondary | ICD-10-CM | POA: Diagnosis not present

## 2015-10-18 DIAGNOSIS — E119 Type 2 diabetes mellitus without complications: Secondary | ICD-10-CM | POA: Diagnosis not present

## 2015-10-18 DIAGNOSIS — Z7982 Long term (current) use of aspirin: Secondary | ICD-10-CM | POA: Diagnosis not present

## 2015-10-20 DIAGNOSIS — Z7982 Long term (current) use of aspirin: Secondary | ICD-10-CM | POA: Diagnosis not present

## 2015-10-20 DIAGNOSIS — E119 Type 2 diabetes mellitus without complications: Secondary | ICD-10-CM | POA: Diagnosis not present

## 2015-10-20 DIAGNOSIS — Z6841 Body Mass Index (BMI) 40.0 and over, adult: Secondary | ICD-10-CM | POA: Diagnosis not present

## 2015-10-20 DIAGNOSIS — Z87891 Personal history of nicotine dependence: Secondary | ICD-10-CM | POA: Diagnosis not present

## 2015-10-20 DIAGNOSIS — M549 Dorsalgia, unspecified: Secondary | ICD-10-CM | POA: Diagnosis not present

## 2015-10-20 DIAGNOSIS — Z7984 Long term (current) use of oral hypoglycemic drugs: Secondary | ICD-10-CM | POA: Diagnosis not present

## 2015-10-21 DIAGNOSIS — E119 Type 2 diabetes mellitus without complications: Secondary | ICD-10-CM | POA: Diagnosis not present

## 2015-10-21 DIAGNOSIS — Z7982 Long term (current) use of aspirin: Secondary | ICD-10-CM | POA: Diagnosis not present

## 2015-10-21 DIAGNOSIS — Z7984 Long term (current) use of oral hypoglycemic drugs: Secondary | ICD-10-CM | POA: Diagnosis not present

## 2015-10-21 DIAGNOSIS — Z87891 Personal history of nicotine dependence: Secondary | ICD-10-CM | POA: Diagnosis not present

## 2015-10-21 DIAGNOSIS — Z6841 Body Mass Index (BMI) 40.0 and over, adult: Secondary | ICD-10-CM | POA: Diagnosis not present

## 2015-10-21 DIAGNOSIS — M549 Dorsalgia, unspecified: Secondary | ICD-10-CM | POA: Diagnosis not present

## 2015-10-25 DIAGNOSIS — Z7982 Long term (current) use of aspirin: Secondary | ICD-10-CM | POA: Diagnosis not present

## 2015-10-25 DIAGNOSIS — E119 Type 2 diabetes mellitus without complications: Secondary | ICD-10-CM | POA: Diagnosis not present

## 2015-10-25 DIAGNOSIS — M549 Dorsalgia, unspecified: Secondary | ICD-10-CM | POA: Diagnosis not present

## 2015-10-25 DIAGNOSIS — Z7984 Long term (current) use of oral hypoglycemic drugs: Secondary | ICD-10-CM | POA: Diagnosis not present

## 2015-10-25 DIAGNOSIS — Z6841 Body Mass Index (BMI) 40.0 and over, adult: Secondary | ICD-10-CM | POA: Diagnosis not present

## 2015-10-25 DIAGNOSIS — Z87891 Personal history of nicotine dependence: Secondary | ICD-10-CM | POA: Diagnosis not present

## 2015-12-21 DIAGNOSIS — I10 Essential (primary) hypertension: Secondary | ICD-10-CM | POA: Diagnosis not present

## 2015-12-21 DIAGNOSIS — M130 Polyarthritis, unspecified: Secondary | ICD-10-CM | POA: Diagnosis not present

## 2015-12-21 DIAGNOSIS — E119 Type 2 diabetes mellitus without complications: Secondary | ICD-10-CM | POA: Diagnosis not present

## 2015-12-23 DIAGNOSIS — E785 Hyperlipidemia, unspecified: Secondary | ICD-10-CM | POA: Diagnosis not present

## 2015-12-23 DIAGNOSIS — E089 Diabetes mellitus due to underlying condition without complications: Secondary | ICD-10-CM | POA: Diagnosis not present

## 2015-12-23 DIAGNOSIS — I10 Essential (primary) hypertension: Secondary | ICD-10-CM | POA: Diagnosis not present

## 2016-01-06 DIAGNOSIS — Z1231 Encounter for screening mammogram for malignant neoplasm of breast: Secondary | ICD-10-CM | POA: Diagnosis not present

## 2016-01-13 DIAGNOSIS — H524 Presbyopia: Secondary | ICD-10-CM | POA: Diagnosis not present

## 2016-01-13 DIAGNOSIS — E119 Type 2 diabetes mellitus without complications: Secondary | ICD-10-CM | POA: Diagnosis not present

## 2016-04-19 DIAGNOSIS — E785 Hyperlipidemia, unspecified: Secondary | ICD-10-CM | POA: Diagnosis not present

## 2016-04-19 DIAGNOSIS — E119 Type 2 diabetes mellitus without complications: Secondary | ICD-10-CM | POA: Diagnosis not present

## 2016-04-19 DIAGNOSIS — I10 Essential (primary) hypertension: Secondary | ICD-10-CM | POA: Diagnosis not present

## 2016-04-23 DIAGNOSIS — T65291D Toxic effect of other tobacco and nicotine, accidental (unintentional), subsequent encounter: Secondary | ICD-10-CM | POA: Diagnosis not present

## 2016-04-23 DIAGNOSIS — E089 Diabetes mellitus due to underlying condition without complications: Secondary | ICD-10-CM | POA: Diagnosis not present

## 2016-04-23 DIAGNOSIS — I10 Essential (primary) hypertension: Secondary | ICD-10-CM | POA: Diagnosis not present

## 2016-08-20 DIAGNOSIS — T65291D Toxic effect of other tobacco and nicotine, accidental (unintentional), subsequent encounter: Secondary | ICD-10-CM | POA: Diagnosis not present

## 2016-08-20 DIAGNOSIS — E119 Type 2 diabetes mellitus without complications: Secondary | ICD-10-CM | POA: Diagnosis not present

## 2016-08-20 DIAGNOSIS — I10 Essential (primary) hypertension: Secondary | ICD-10-CM | POA: Diagnosis not present

## 2016-09-20 DIAGNOSIS — E669 Obesity, unspecified: Secondary | ICD-10-CM | POA: Diagnosis not present

## 2016-09-20 DIAGNOSIS — E0800 Diabetes mellitus due to underlying condition with hyperosmolarity without nonketotic hyperglycemic-hyperosmolar coma (NKHHC): Secondary | ICD-10-CM | POA: Diagnosis not present

## 2016-09-20 DIAGNOSIS — M130 Polyarthritis, unspecified: Secondary | ICD-10-CM | POA: Diagnosis not present

## 2016-11-05 DIAGNOSIS — E785 Hyperlipidemia, unspecified: Secondary | ICD-10-CM | POA: Diagnosis not present

## 2016-11-05 DIAGNOSIS — T65291D Toxic effect of other tobacco and nicotine, accidental (unintentional), subsequent encounter: Secondary | ICD-10-CM | POA: Diagnosis not present

## 2016-11-05 DIAGNOSIS — I10 Essential (primary) hypertension: Secondary | ICD-10-CM | POA: Diagnosis not present

## 2016-11-05 DIAGNOSIS — M130 Polyarthritis, unspecified: Secondary | ICD-10-CM | POA: Diagnosis not present

## 2016-11-05 DIAGNOSIS — Z23 Encounter for immunization: Secondary | ICD-10-CM | POA: Diagnosis not present

## 2017-01-03 DIAGNOSIS — N3281 Overactive bladder: Secondary | ICD-10-CM | POA: Diagnosis not present

## 2017-01-03 DIAGNOSIS — E089 Diabetes mellitus due to underlying condition without complications: Secondary | ICD-10-CM | POA: Diagnosis not present

## 2017-01-03 DIAGNOSIS — E669 Obesity, unspecified: Secondary | ICD-10-CM | POA: Diagnosis not present

## 2017-01-03 DIAGNOSIS — I10 Essential (primary) hypertension: Secondary | ICD-10-CM | POA: Diagnosis not present

## 2017-01-30 DIAGNOSIS — Z1231 Encounter for screening mammogram for malignant neoplasm of breast: Secondary | ICD-10-CM | POA: Diagnosis not present

## 2017-02-04 DIAGNOSIS — N3281 Overactive bladder: Secondary | ICD-10-CM | POA: Diagnosis not present

## 2017-02-04 DIAGNOSIS — E669 Obesity, unspecified: Secondary | ICD-10-CM | POA: Diagnosis not present

## 2017-02-04 DIAGNOSIS — E0800 Diabetes mellitus due to underlying condition with hyperosmolarity without nonketotic hyperglycemic-hyperosmolar coma (NKHHC): Secondary | ICD-10-CM | POA: Diagnosis not present

## 2017-02-04 DIAGNOSIS — I10 Essential (primary) hypertension: Secondary | ICD-10-CM | POA: Diagnosis not present

## 2017-03-04 DIAGNOSIS — H35033 Hypertensive retinopathy, bilateral: Secondary | ICD-10-CM | POA: Diagnosis not present

## 2017-03-04 DIAGNOSIS — H524 Presbyopia: Secondary | ICD-10-CM | POA: Diagnosis not present

## 2017-03-04 DIAGNOSIS — H5203 Hypermetropia, bilateral: Secondary | ICD-10-CM | POA: Diagnosis not present

## 2017-03-04 DIAGNOSIS — H11153 Pinguecula, bilateral: Secondary | ICD-10-CM | POA: Diagnosis not present

## 2017-03-04 DIAGNOSIS — E119 Type 2 diabetes mellitus without complications: Secondary | ICD-10-CM | POA: Diagnosis not present

## 2017-03-04 DIAGNOSIS — Z7984 Long term (current) use of oral hypoglycemic drugs: Secondary | ICD-10-CM | POA: Diagnosis not present

## 2017-03-04 DIAGNOSIS — I10 Essential (primary) hypertension: Secondary | ICD-10-CM | POA: Diagnosis not present

## 2017-03-26 DIAGNOSIS — E0859 Diabetes mellitus due to underlying condition with other circulatory complications: Secondary | ICD-10-CM | POA: Diagnosis not present

## 2017-03-26 DIAGNOSIS — I10 Essential (primary) hypertension: Secondary | ICD-10-CM | POA: Diagnosis not present

## 2017-03-26 DIAGNOSIS — E785 Hyperlipidemia, unspecified: Secondary | ICD-10-CM | POA: Diagnosis not present

## 2017-07-22 DIAGNOSIS — I10 Essential (primary) hypertension: Secondary | ICD-10-CM | POA: Diagnosis not present

## 2017-07-22 DIAGNOSIS — E0859 Diabetes mellitus due to underlying condition with other circulatory complications: Secondary | ICD-10-CM | POA: Diagnosis not present

## 2017-07-22 DIAGNOSIS — E785 Hyperlipidemia, unspecified: Secondary | ICD-10-CM | POA: Diagnosis not present

## 2017-07-24 DIAGNOSIS — E089 Diabetes mellitus due to underlying condition without complications: Secondary | ICD-10-CM | POA: Diagnosis not present

## 2017-07-24 DIAGNOSIS — M25519 Pain in unspecified shoulder: Secondary | ICD-10-CM | POA: Diagnosis not present

## 2017-07-24 DIAGNOSIS — I10 Essential (primary) hypertension: Secondary | ICD-10-CM | POA: Diagnosis not present

## 2017-07-24 DIAGNOSIS — Z6841 Body Mass Index (BMI) 40.0 and over, adult: Secondary | ICD-10-CM | POA: Diagnosis not present

## 2017-10-04 IMAGING — CR DG CHEST 1V PORT
1 series · 1 of 1 positions shown · non-contrast
Comparison: 12/11/2014

CLINICAL DATA: Sepsis

EXAM:
PORTABLE CHEST 1 VIEW

[AP]
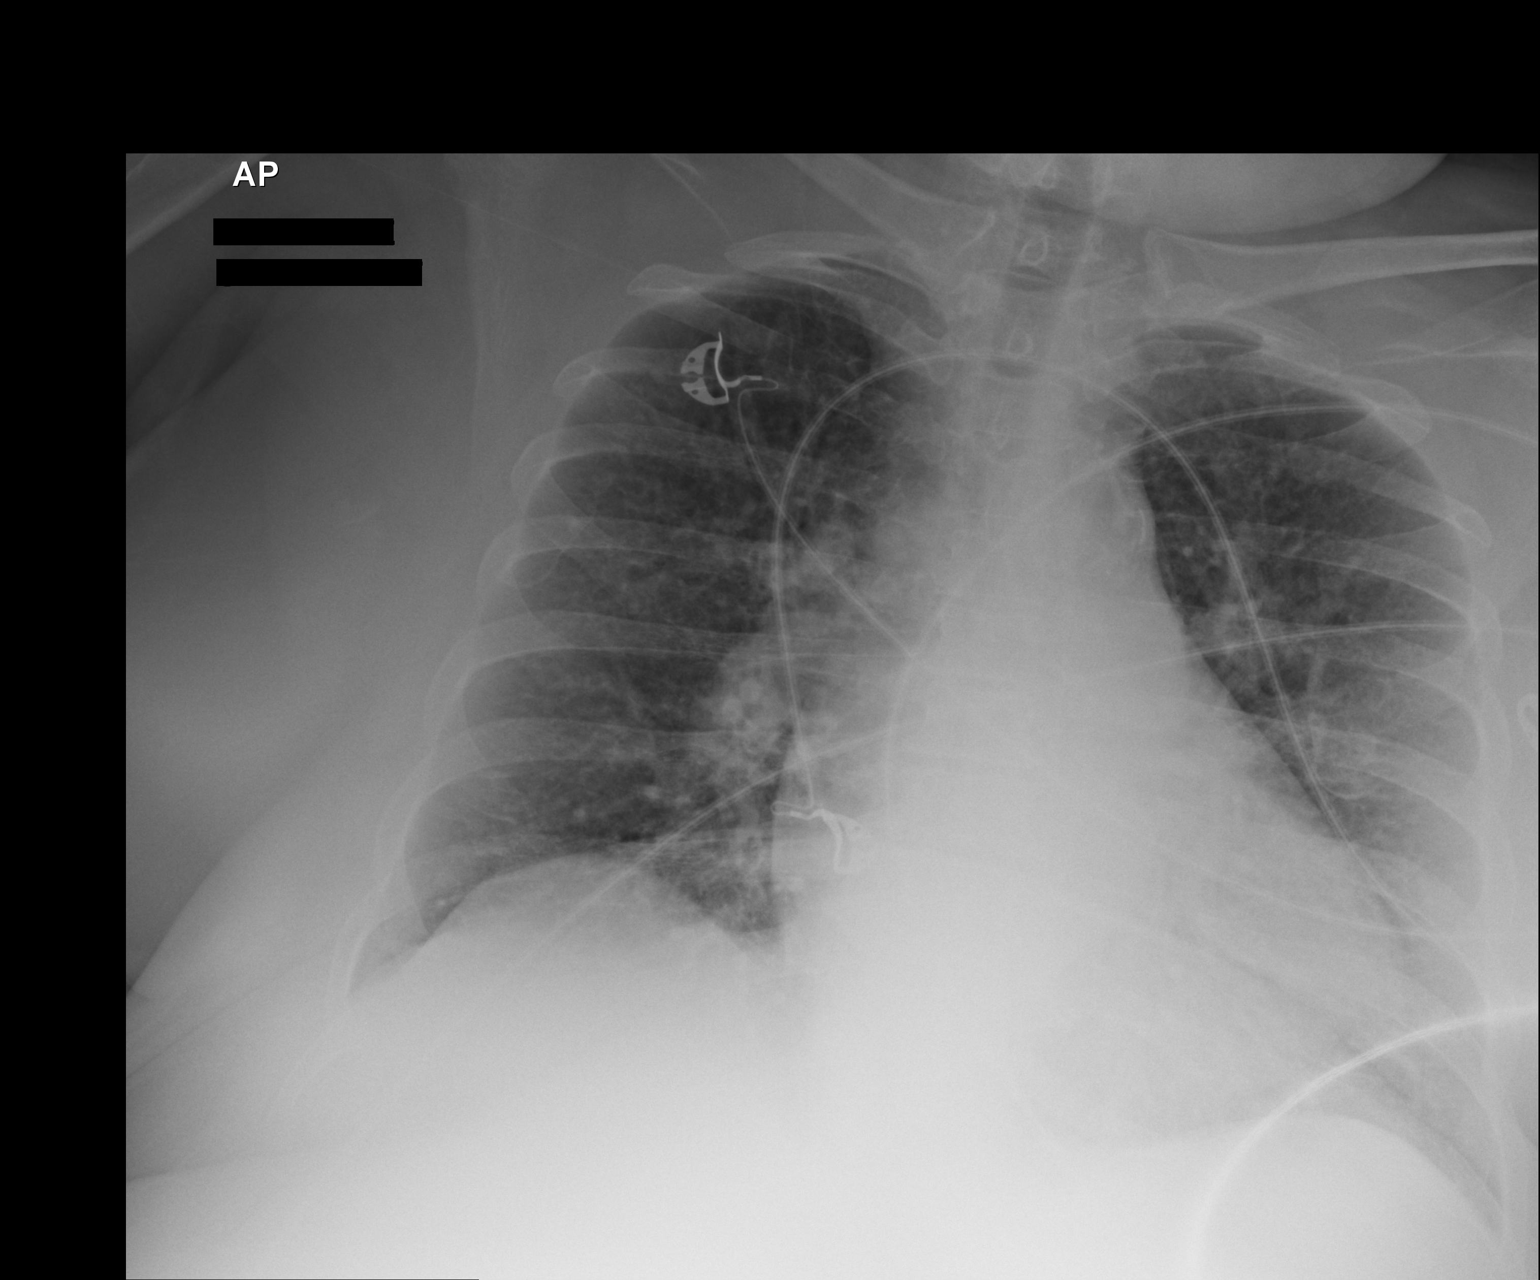

[1 of 1 positions shown; findings below may reference images not displayed]

FINDINGS: Mild cardiac enlargement. Mild pulmonary vascular congestion without
edema. Negative for pneumonia or effusion.
IMPRESSION: Cardiac and enlargement with mild vascular congestion which has
progressed since the prior study. Negative for edema.

## 2017-10-04 IMAGING — CT CT ABD-PELV W/ CM
2 of 5 series · 16 of 46 positions shown, 18 images · IV contrast (APPLIED)
Comparison: None.

CLINICAL DATA: Fever of unknown origin.

EXAM:
CT ABDOMEN AND PELVIS WITH CONTRAST
TECHNIQUE: Multidetector CT imaging of the abdomen and pelvis was performed
using the standard protocol following bolus administration of
intravenous contrast.
CONTRAST:  100mL OMNIPAQUE IOHEXOL 300 MG/ML  SOLN

[Series 2: abd/ pelvis 5.0 i30f 1 · axial · 0.98mm/px · z∈[-218,+196]mm · 13 of 93 slices shown, 15 images]
[im 5/93  soft-tissue]
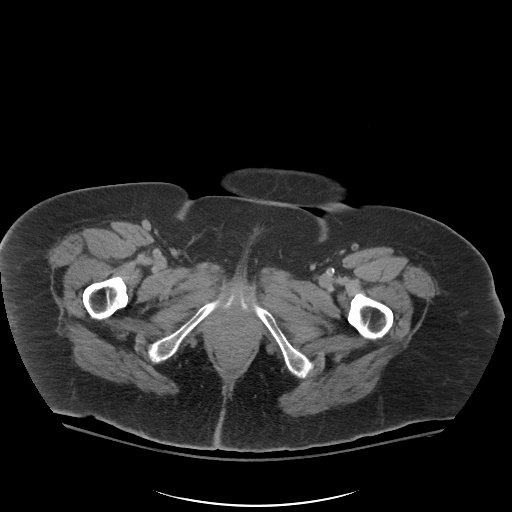
[im 5/93  bone]
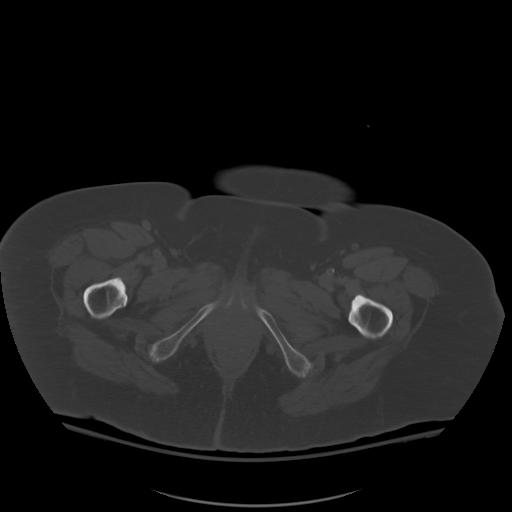
[im 15/93  soft-tissue]
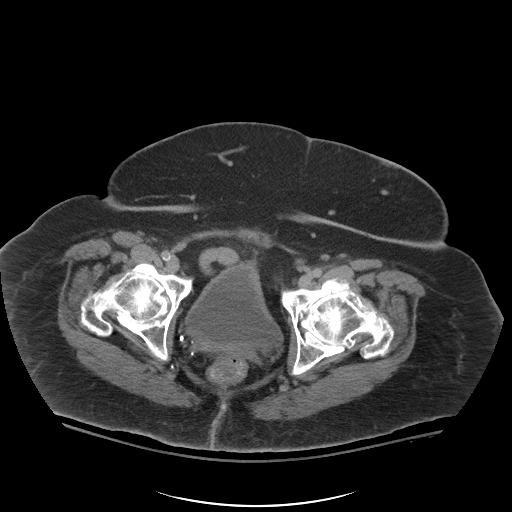
[im 20/93  soft-tissue]
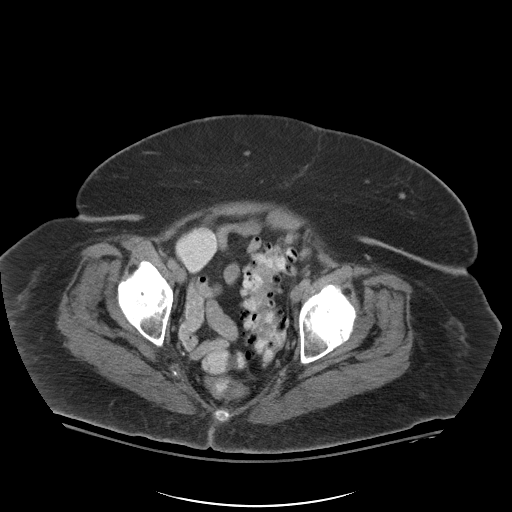
[im 25/93  soft-tissue]
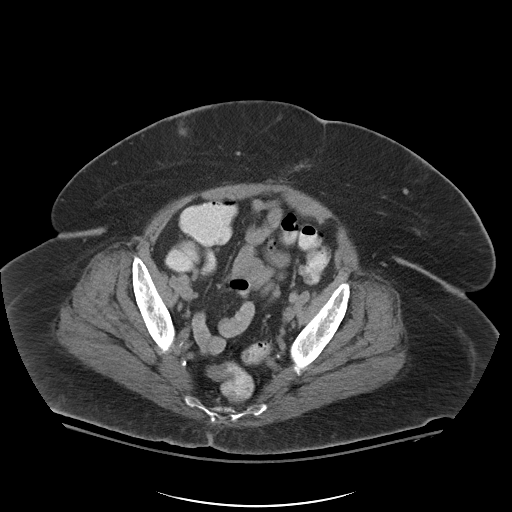
[im 34/93  soft-tissue]
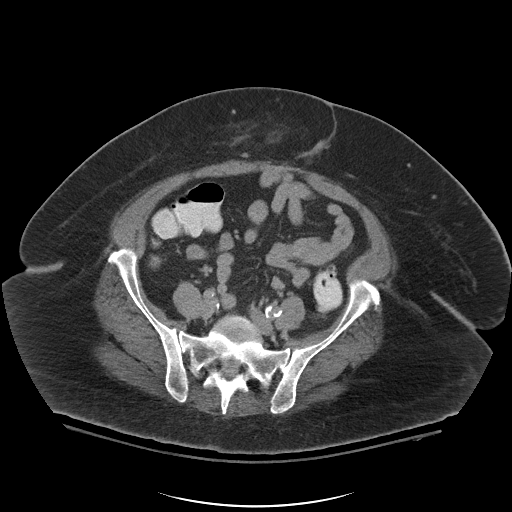
[im 39/93  soft-tissue]
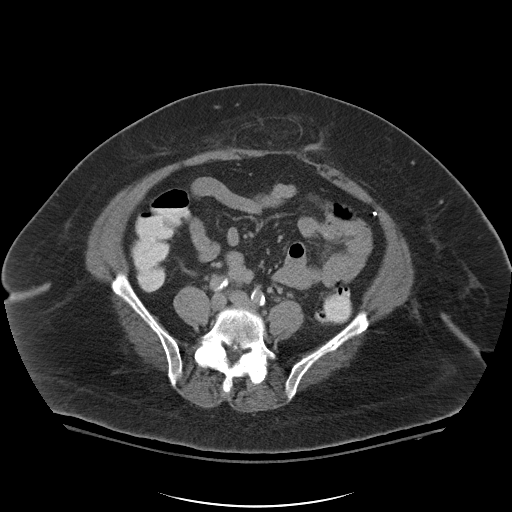
[im 49/93  soft-tissue]
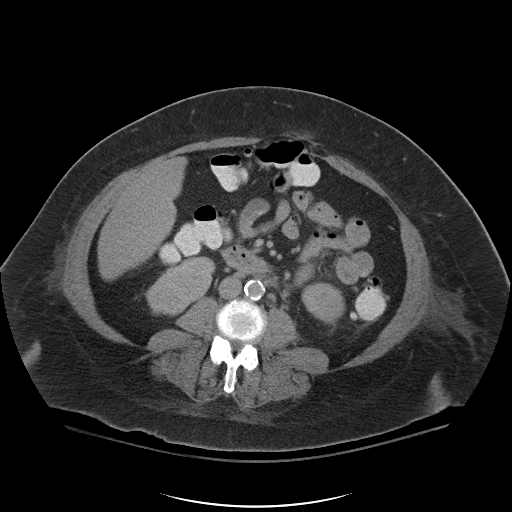
[im 54/93  soft-tissue]
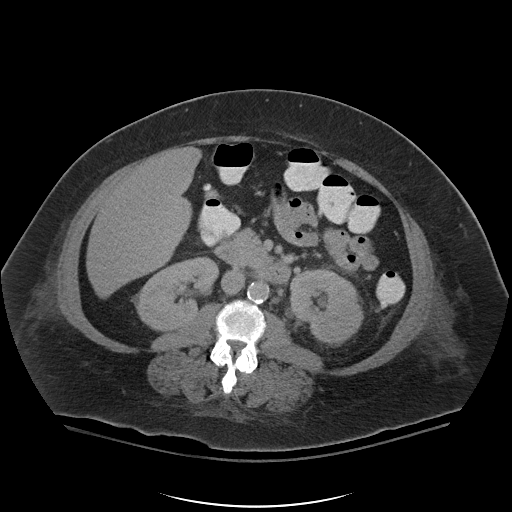
[im 59/93  soft-tissue]
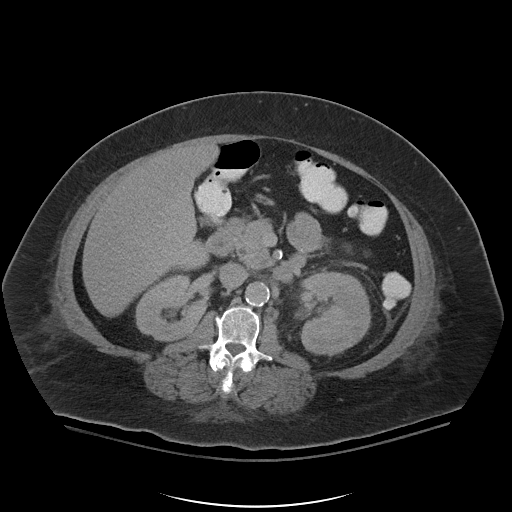
[im 59/93  bone]
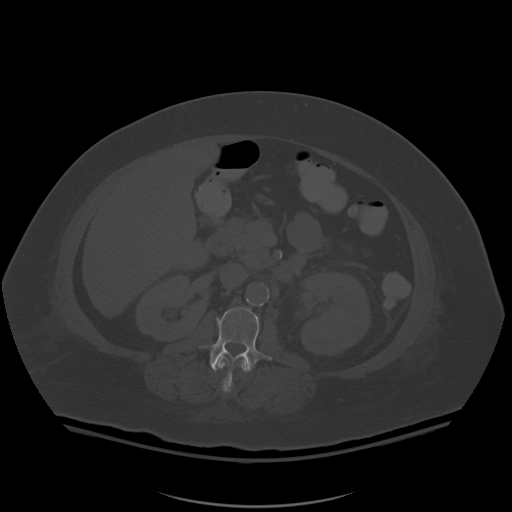
[im 68/93  soft-tissue]
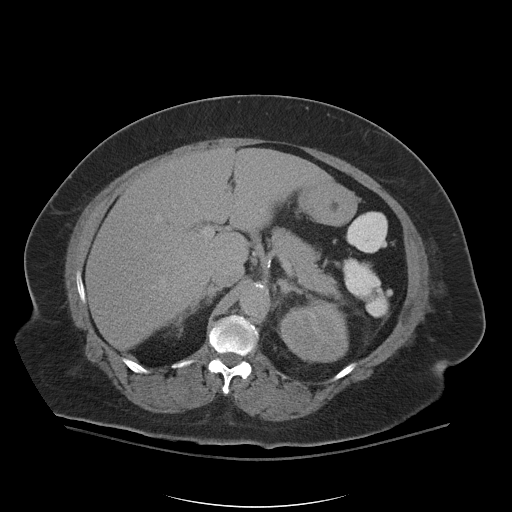
[im 73/93  soft-tissue]
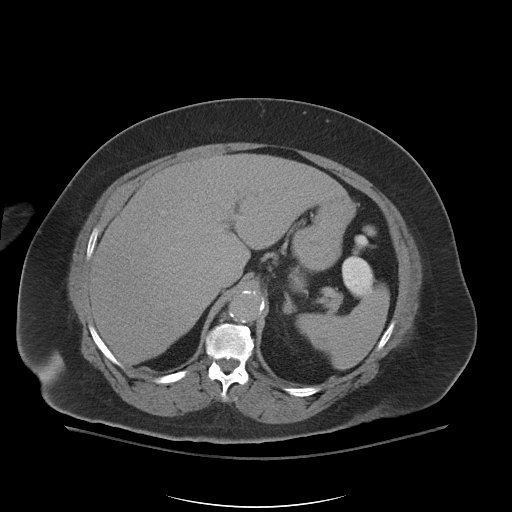
[im 78/93  soft-tissue]
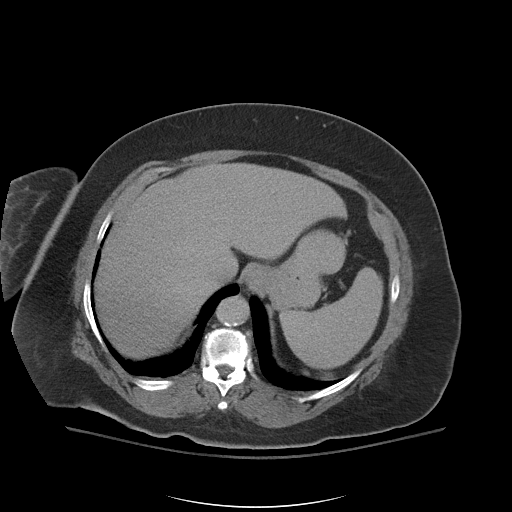
[im 88/93  soft-tissue]
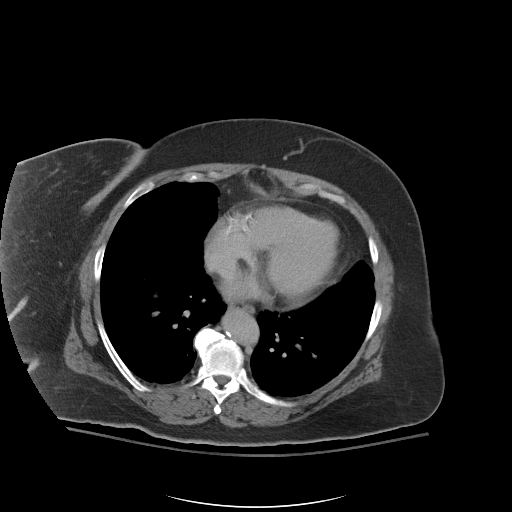

[Series 5: coronal soft tissue · coronal · 0.93mm/px · 3 of 110 slices shown]
[im 37/110  soft-tissue]
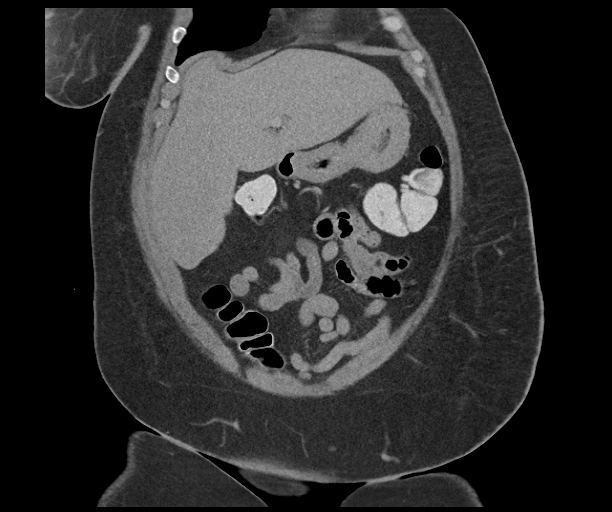
[im 49/110  soft-tissue]
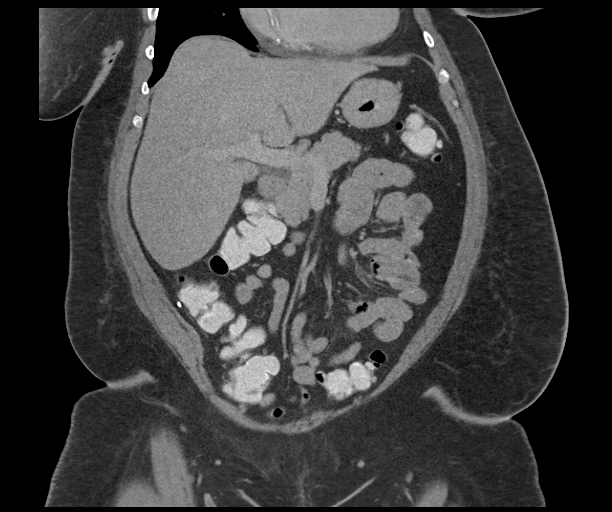
[im 61/110  soft-tissue]
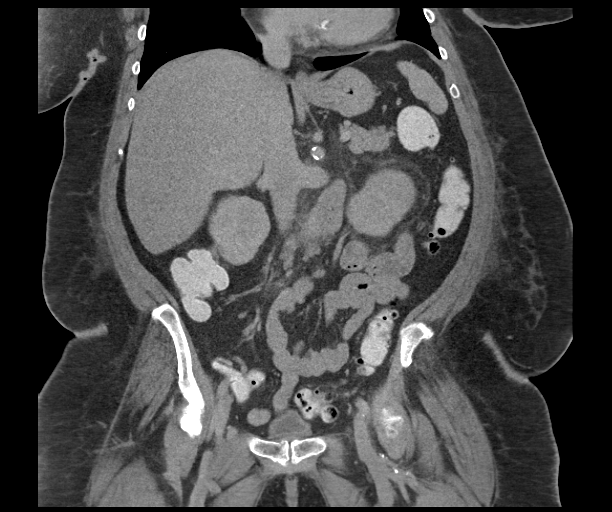

[16 of 46 positions shown; findings below may reference images not displayed]

FINDINGS: Contrast bolus opacification with CT imaging is suboptimal. This
limits parenchymal evaluation of the solid organs. However, there
does appear to be asymmetric edema of the left kidney compared to
the right with associated mild stranding in left perinephric fat.
Findings are suggestive of pyelonephritis. Correlation suggested
with urinalysis. No hydronephrosis or renal calculi identified. The
right kidney appears normal.

The liver, spleen, pancreas, adrenal glands and bowel appear
unremarkable. No evidence of bowel obstruction, free air or free
fluid. No focal abscess is identified.

The gallbladder is been removed. No biliary ductal dilatation is
identified. The abdominal aorta and iliac arteries are calcified
without evidence of aneurysm. There is a focal midline ventral
hernia defect just superior to the umbilicus containing fat. Bony
structures are unremarkable.
IMPRESSION: 1. CT findings suggestive of left-sided pyelonephritis. Perfusion of
the left kidney is not optimally assessed due to relatively poor
contrast bolus injection. There is no evidence of hydronephrosis or
obstructing left-sided calculus.
2. No focal abscess identified.
3. Focal midline ventral hernia containing fat.

## 2017-11-21 DIAGNOSIS — I10 Essential (primary) hypertension: Secondary | ICD-10-CM | POA: Diagnosis not present

## 2017-11-21 DIAGNOSIS — M25519 Pain in unspecified shoulder: Secondary | ICD-10-CM | POA: Diagnosis not present

## 2017-11-21 DIAGNOSIS — E089 Diabetes mellitus due to underlying condition without complications: Secondary | ICD-10-CM | POA: Diagnosis not present

## 2017-11-22 DIAGNOSIS — I10 Essential (primary) hypertension: Secondary | ICD-10-CM | POA: Diagnosis not present

## 2017-11-22 DIAGNOSIS — M130 Polyarthritis, unspecified: Secondary | ICD-10-CM | POA: Diagnosis not present

## 2017-11-22 DIAGNOSIS — E089 Diabetes mellitus due to underlying condition without complications: Secondary | ICD-10-CM | POA: Diagnosis not present

## 2017-11-22 DIAGNOSIS — Z0000 Encounter for general adult medical examination without abnormal findings: Secondary | ICD-10-CM | POA: Diagnosis not present

## 2020-03-03 DIAGNOSIS — B351 Tinea unguium: Secondary | ICD-10-CM | POA: Diagnosis not present

## 2020-03-03 DIAGNOSIS — I739 Peripheral vascular disease, unspecified: Secondary | ICD-10-CM | POA: Diagnosis not present

## 2020-03-03 DIAGNOSIS — L84 Corns and callosities: Secondary | ICD-10-CM | POA: Diagnosis not present

## 2020-03-16 DIAGNOSIS — M130 Polyarthritis, unspecified: Secondary | ICD-10-CM | POA: Diagnosis not present

## 2020-03-16 DIAGNOSIS — E1169 Type 2 diabetes mellitus with other specified complication: Secondary | ICD-10-CM | POA: Diagnosis not present

## 2020-03-16 DIAGNOSIS — E785 Hyperlipidemia, unspecified: Secondary | ICD-10-CM | POA: Diagnosis not present

## 2020-03-16 DIAGNOSIS — I10 Essential (primary) hypertension: Secondary | ICD-10-CM | POA: Diagnosis not present

## 2020-03-16 DIAGNOSIS — E6609 Other obesity due to excess calories: Secondary | ICD-10-CM | POA: Diagnosis not present

## 2020-03-17 DIAGNOSIS — I10 Essential (primary) hypertension: Secondary | ICD-10-CM | POA: Diagnosis not present

## 2020-03-17 DIAGNOSIS — M62830 Muscle spasm of back: Secondary | ICD-10-CM | POA: Diagnosis not present

## 2020-03-17 DIAGNOSIS — E1169 Type 2 diabetes mellitus with other specified complication: Secondary | ICD-10-CM | POA: Diagnosis not present

## 2020-04-05 DIAGNOSIS — E1169 Type 2 diabetes mellitus with other specified complication: Secondary | ICD-10-CM | POA: Diagnosis not present

## 2020-04-05 DIAGNOSIS — Z0000 Encounter for general adult medical examination without abnormal findings: Secondary | ICD-10-CM | POA: Diagnosis not present

## 2020-05-19 DIAGNOSIS — B351 Tinea unguium: Secondary | ICD-10-CM | POA: Diagnosis not present

## 2020-05-19 DIAGNOSIS — L84 Corns and callosities: Secondary | ICD-10-CM | POA: Diagnosis not present

## 2020-05-19 DIAGNOSIS — I739 Peripheral vascular disease, unspecified: Secondary | ICD-10-CM | POA: Diagnosis not present

## 2020-07-14 DIAGNOSIS — M130 Polyarthritis, unspecified: Secondary | ICD-10-CM | POA: Diagnosis not present

## 2020-07-14 DIAGNOSIS — I10 Essential (primary) hypertension: Secondary | ICD-10-CM | POA: Diagnosis not present

## 2020-07-14 DIAGNOSIS — E669 Obesity, unspecified: Secondary | ICD-10-CM | POA: Diagnosis not present

## 2020-07-14 DIAGNOSIS — N390 Urinary tract infection, site not specified: Secondary | ICD-10-CM | POA: Diagnosis not present

## 2020-07-14 DIAGNOSIS — E785 Hyperlipidemia, unspecified: Secondary | ICD-10-CM | POA: Diagnosis not present

## 2020-07-14 DIAGNOSIS — E1169 Type 2 diabetes mellitus with other specified complication: Secondary | ICD-10-CM | POA: Diagnosis not present

## 2020-07-18 DIAGNOSIS — M130 Polyarthritis, unspecified: Secondary | ICD-10-CM | POA: Diagnosis not present

## 2020-07-18 DIAGNOSIS — E1169 Type 2 diabetes mellitus with other specified complication: Secondary | ICD-10-CM | POA: Diagnosis not present

## 2020-07-18 DIAGNOSIS — I10 Essential (primary) hypertension: Secondary | ICD-10-CM | POA: Diagnosis not present

## 2020-07-28 DIAGNOSIS — L84 Corns and callosities: Secondary | ICD-10-CM | POA: Diagnosis not present

## 2020-07-28 DIAGNOSIS — L602 Onychogryphosis: Secondary | ICD-10-CM | POA: Diagnosis not present

## 2020-07-28 DIAGNOSIS — I739 Peripheral vascular disease, unspecified: Secondary | ICD-10-CM | POA: Diagnosis not present

## 2020-09-30 DIAGNOSIS — M130 Polyarthritis, unspecified: Secondary | ICD-10-CM | POA: Diagnosis not present

## 2020-09-30 DIAGNOSIS — E1169 Type 2 diabetes mellitus with other specified complication: Secondary | ICD-10-CM | POA: Diagnosis not present

## 2020-09-30 DIAGNOSIS — I10 Essential (primary) hypertension: Secondary | ICD-10-CM | POA: Diagnosis not present

## 2020-09-30 DIAGNOSIS — E785 Hyperlipidemia, unspecified: Secondary | ICD-10-CM | POA: Diagnosis not present

## 2020-10-19 DIAGNOSIS — M130 Polyarthritis, unspecified: Secondary | ICD-10-CM | POA: Diagnosis not present

## 2020-10-19 DIAGNOSIS — I10 Essential (primary) hypertension: Secondary | ICD-10-CM | POA: Diagnosis not present

## 2020-10-19 DIAGNOSIS — E1169 Type 2 diabetes mellitus with other specified complication: Secondary | ICD-10-CM | POA: Diagnosis not present

## 2020-10-19 DIAGNOSIS — E6609 Other obesity due to excess calories: Secondary | ICD-10-CM | POA: Diagnosis not present

## 2020-10-20 DIAGNOSIS — E1169 Type 2 diabetes mellitus with other specified complication: Secondary | ICD-10-CM | POA: Diagnosis not present

## 2020-10-20 DIAGNOSIS — M2510 Fistula, unspecified joint: Secondary | ICD-10-CM | POA: Diagnosis not present

## 2020-10-20 DIAGNOSIS — Z23 Encounter for immunization: Secondary | ICD-10-CM | POA: Diagnosis not present

## 2020-10-20 DIAGNOSIS — I10 Essential (primary) hypertension: Secondary | ICD-10-CM | POA: Diagnosis not present

## 2020-10-20 DIAGNOSIS — E7800 Pure hypercholesterolemia, unspecified: Secondary | ICD-10-CM | POA: Diagnosis not present

## 2020-12-20 DIAGNOSIS — L602 Onychogryphosis: Secondary | ICD-10-CM | POA: Diagnosis not present

## 2020-12-20 DIAGNOSIS — B351 Tinea unguium: Secondary | ICD-10-CM | POA: Diagnosis not present

## 2020-12-20 DIAGNOSIS — M205X1 Other deformities of toe(s) (acquired), right foot: Secondary | ICD-10-CM | POA: Diagnosis not present

## 2020-12-20 DIAGNOSIS — E1351 Other specified diabetes mellitus with diabetic peripheral angiopathy without gangrene: Secondary | ICD-10-CM | POA: Diagnosis not present

## 2020-12-20 DIAGNOSIS — M205X2 Other deformities of toe(s) (acquired), left foot: Secondary | ICD-10-CM | POA: Diagnosis not present

## 2020-12-20 DIAGNOSIS — L84 Corns and callosities: Secondary | ICD-10-CM | POA: Diagnosis not present

## 2020-12-20 DIAGNOSIS — I739 Peripheral vascular disease, unspecified: Secondary | ICD-10-CM | POA: Diagnosis not present

## 2020-12-22 DIAGNOSIS — I739 Peripheral vascular disease, unspecified: Secondary | ICD-10-CM | POA: Diagnosis not present

## 2020-12-30 DIAGNOSIS — I10 Essential (primary) hypertension: Secondary | ICD-10-CM | POA: Diagnosis not present

## 2020-12-30 DIAGNOSIS — E1169 Type 2 diabetes mellitus with other specified complication: Secondary | ICD-10-CM | POA: Diagnosis not present

## 2020-12-30 DIAGNOSIS — M130 Polyarthritis, unspecified: Secondary | ICD-10-CM | POA: Diagnosis not present

## 2020-12-30 DIAGNOSIS — E785 Hyperlipidemia, unspecified: Secondary | ICD-10-CM | POA: Diagnosis not present

## 2021-01-26 DIAGNOSIS — G4730 Sleep apnea, unspecified: Secondary | ICD-10-CM | POA: Diagnosis not present

## 2021-01-26 DIAGNOSIS — M62830 Muscle spasm of back: Secondary | ICD-10-CM | POA: Diagnosis not present

## 2021-01-26 DIAGNOSIS — E0859 Diabetes mellitus due to underlying condition with other circulatory complications: Secondary | ICD-10-CM | POA: Diagnosis not present

## 2021-01-26 DIAGNOSIS — I10 Essential (primary) hypertension: Secondary | ICD-10-CM | POA: Diagnosis not present

## 2021-01-29 DIAGNOSIS — M130 Polyarthritis, unspecified: Secondary | ICD-10-CM | POA: Diagnosis not present

## 2021-01-29 DIAGNOSIS — I10 Essential (primary) hypertension: Secondary | ICD-10-CM | POA: Diagnosis not present

## 2021-01-29 DIAGNOSIS — E1169 Type 2 diabetes mellitus with other specified complication: Secondary | ICD-10-CM | POA: Diagnosis not present

## 2021-01-29 DIAGNOSIS — E785 Hyperlipidemia, unspecified: Secondary | ICD-10-CM | POA: Diagnosis not present

## 2021-02-03 DIAGNOSIS — I70223 Atherosclerosis of native arteries of extremities with rest pain, bilateral legs: Secondary | ICD-10-CM | POA: Diagnosis not present

## 2021-02-09 DIAGNOSIS — E1169 Type 2 diabetes mellitus with other specified complication: Secondary | ICD-10-CM | POA: Diagnosis not present

## 2021-02-09 DIAGNOSIS — M62830 Muscle spasm of back: Secondary | ICD-10-CM | POA: Diagnosis not present

## 2021-02-09 DIAGNOSIS — E785 Hyperlipidemia, unspecified: Secondary | ICD-10-CM | POA: Diagnosis not present

## 2021-02-09 DIAGNOSIS — G4730 Sleep apnea, unspecified: Secondary | ICD-10-CM | POA: Diagnosis not present

## 2021-02-09 DIAGNOSIS — M6250 Muscle wasting and atrophy, not elsewhere classified, unspecified site: Secondary | ICD-10-CM | POA: Diagnosis not present

## 2021-02-24 DIAGNOSIS — Z1231 Encounter for screening mammogram for malignant neoplasm of breast: Secondary | ICD-10-CM | POA: Diagnosis not present

## 2021-02-28 DIAGNOSIS — M130 Polyarthritis, unspecified: Secondary | ICD-10-CM | POA: Diagnosis not present

## 2021-02-28 DIAGNOSIS — E1169 Type 2 diabetes mellitus with other specified complication: Secondary | ICD-10-CM | POA: Diagnosis not present

## 2021-02-28 DIAGNOSIS — I10 Essential (primary) hypertension: Secondary | ICD-10-CM | POA: Diagnosis not present

## 2021-02-28 DIAGNOSIS — E785 Hyperlipidemia, unspecified: Secondary | ICD-10-CM | POA: Diagnosis not present

## 2021-03-20 DIAGNOSIS — I739 Peripheral vascular disease, unspecified: Secondary | ICD-10-CM | POA: Diagnosis not present

## 2021-03-20 DIAGNOSIS — M205X1 Other deformities of toe(s) (acquired), right foot: Secondary | ICD-10-CM | POA: Diagnosis not present

## 2021-03-20 DIAGNOSIS — M205X2 Other deformities of toe(s) (acquired), left foot: Secondary | ICD-10-CM | POA: Diagnosis not present

## 2021-03-20 DIAGNOSIS — B351 Tinea unguium: Secondary | ICD-10-CM | POA: Diagnosis not present

## 2021-03-20 DIAGNOSIS — E1351 Other specified diabetes mellitus with diabetic peripheral angiopathy without gangrene: Secondary | ICD-10-CM | POA: Diagnosis not present

## 2021-03-27 DIAGNOSIS — I70223 Atherosclerosis of native arteries of extremities with rest pain, bilateral legs: Secondary | ICD-10-CM | POA: Diagnosis not present

## 2021-03-27 DIAGNOSIS — M79669 Pain in unspecified lower leg: Secondary | ICD-10-CM | POA: Diagnosis not present

## 2021-04-02 DIAGNOSIS — I70222 Atherosclerosis of native arteries of extremities with rest pain, left leg: Secondary | ICD-10-CM | POA: Diagnosis not present

## 2021-04-03 DIAGNOSIS — I70222 Atherosclerosis of native arteries of extremities with rest pain, left leg: Secondary | ICD-10-CM | POA: Diagnosis not present

## 2021-05-22 DIAGNOSIS — M205X1 Other deformities of toe(s) (acquired), right foot: Secondary | ICD-10-CM | POA: Diagnosis not present

## 2021-05-22 DIAGNOSIS — E1351 Other specified diabetes mellitus with diabetic peripheral angiopathy without gangrene: Secondary | ICD-10-CM | POA: Diagnosis not present

## 2021-05-22 DIAGNOSIS — M205X2 Other deformities of toe(s) (acquired), left foot: Secondary | ICD-10-CM | POA: Diagnosis not present

## 2021-05-22 DIAGNOSIS — L851 Acquired keratosis [keratoderma] palmaris et plantaris: Secondary | ICD-10-CM | POA: Diagnosis not present

## 2021-05-22 DIAGNOSIS — I739 Peripheral vascular disease, unspecified: Secondary | ICD-10-CM | POA: Diagnosis not present

## 2021-05-31 DIAGNOSIS — I10 Essential (primary) hypertension: Secondary | ICD-10-CM | POA: Diagnosis not present

## 2021-05-31 DIAGNOSIS — E785 Hyperlipidemia, unspecified: Secondary | ICD-10-CM | POA: Diagnosis not present

## 2021-06-08 DIAGNOSIS — I10 Essential (primary) hypertension: Secondary | ICD-10-CM | POA: Diagnosis not present

## 2021-06-08 DIAGNOSIS — E1169 Type 2 diabetes mellitus with other specified complication: Secondary | ICD-10-CM | POA: Diagnosis not present

## 2021-06-08 DIAGNOSIS — G4730 Sleep apnea, unspecified: Secondary | ICD-10-CM | POA: Diagnosis not present

## 2021-06-08 DIAGNOSIS — E785 Hyperlipidemia, unspecified: Secondary | ICD-10-CM | POA: Diagnosis not present

## 2021-06-09 DIAGNOSIS — Z0000 Encounter for general adult medical examination without abnormal findings: Secondary | ICD-10-CM | POA: Diagnosis not present

## 2021-06-09 DIAGNOSIS — M130 Polyarthritis, unspecified: Secondary | ICD-10-CM | POA: Diagnosis not present

## 2021-06-09 DIAGNOSIS — G4730 Sleep apnea, unspecified: Secondary | ICD-10-CM | POA: Diagnosis not present

## 2021-06-09 DIAGNOSIS — E785 Hyperlipidemia, unspecified: Secondary | ICD-10-CM | POA: Diagnosis not present

## 2021-06-09 DIAGNOSIS — E1169 Type 2 diabetes mellitus with other specified complication: Secondary | ICD-10-CM | POA: Diagnosis not present

## 2021-06-09 DIAGNOSIS — I10 Essential (primary) hypertension: Secondary | ICD-10-CM | POA: Diagnosis not present

## 2021-07-27 DIAGNOSIS — M205X2 Other deformities of toe(s) (acquired), left foot: Secondary | ICD-10-CM | POA: Diagnosis not present

## 2021-07-27 DIAGNOSIS — E1351 Other specified diabetes mellitus with diabetic peripheral angiopathy without gangrene: Secondary | ICD-10-CM | POA: Diagnosis not present

## 2021-07-27 DIAGNOSIS — L851 Acquired keratosis [keratoderma] palmaris et plantaris: Secondary | ICD-10-CM | POA: Diagnosis not present

## 2021-07-27 DIAGNOSIS — I739 Peripheral vascular disease, unspecified: Secondary | ICD-10-CM | POA: Diagnosis not present

## 2021-07-27 DIAGNOSIS — M205X1 Other deformities of toe(s) (acquired), right foot: Secondary | ICD-10-CM | POA: Diagnosis not present

## 2021-08-31 DIAGNOSIS — E785 Hyperlipidemia, unspecified: Secondary | ICD-10-CM | POA: Diagnosis not present

## 2021-08-31 DIAGNOSIS — E1169 Type 2 diabetes mellitus with other specified complication: Secondary | ICD-10-CM | POA: Diagnosis not present

## 2021-08-31 DIAGNOSIS — I10 Essential (primary) hypertension: Secondary | ICD-10-CM | POA: Diagnosis not present

## 2021-10-10 DIAGNOSIS — I10 Essential (primary) hypertension: Secondary | ICD-10-CM | POA: Diagnosis not present

## 2021-10-10 DIAGNOSIS — E785 Hyperlipidemia, unspecified: Secondary | ICD-10-CM | POA: Diagnosis not present

## 2021-10-10 DIAGNOSIS — M130 Polyarthritis, unspecified: Secondary | ICD-10-CM | POA: Diagnosis not present

## 2021-10-10 DIAGNOSIS — E1169 Type 2 diabetes mellitus with other specified complication: Secondary | ICD-10-CM | POA: Diagnosis not present

## 2021-10-11 DIAGNOSIS — Z23 Encounter for immunization: Secondary | ICD-10-CM | POA: Diagnosis not present

## 2021-10-11 DIAGNOSIS — E1169 Type 2 diabetes mellitus with other specified complication: Secondary | ICD-10-CM | POA: Diagnosis not present

## 2021-10-11 DIAGNOSIS — I10 Essential (primary) hypertension: Secondary | ICD-10-CM | POA: Diagnosis not present

## 2021-10-30 DIAGNOSIS — I739 Peripheral vascular disease, unspecified: Secondary | ICD-10-CM | POA: Diagnosis not present

## 2021-11-08 DIAGNOSIS — I739 Peripheral vascular disease, unspecified: Secondary | ICD-10-CM | POA: Diagnosis not present

## 2021-11-30 DIAGNOSIS — I10 Essential (primary) hypertension: Secondary | ICD-10-CM | POA: Diagnosis not present

## 2021-11-30 DIAGNOSIS — E785 Hyperlipidemia, unspecified: Secondary | ICD-10-CM | POA: Diagnosis not present

## 2021-11-30 DIAGNOSIS — E1169 Type 2 diabetes mellitus with other specified complication: Secondary | ICD-10-CM | POA: Diagnosis not present

## 2022-01-30 DIAGNOSIS — M205X2 Other deformities of toe(s) (acquired), left foot: Secondary | ICD-10-CM | POA: Diagnosis not present

## 2022-01-30 DIAGNOSIS — E1351 Other specified diabetes mellitus with diabetic peripheral angiopathy without gangrene: Secondary | ICD-10-CM | POA: Diagnosis not present

## 2022-01-30 DIAGNOSIS — M205X1 Other deformities of toe(s) (acquired), right foot: Secondary | ICD-10-CM | POA: Diagnosis not present

## 2022-01-30 DIAGNOSIS — B351 Tinea unguium: Secondary | ICD-10-CM | POA: Diagnosis not present

## 2022-01-30 DIAGNOSIS — L851 Acquired keratosis [keratoderma] palmaris et plantaris: Secondary | ICD-10-CM | POA: Diagnosis not present

## 2022-01-30 DIAGNOSIS — I739 Peripheral vascular disease, unspecified: Secondary | ICD-10-CM | POA: Diagnosis not present

## 2022-02-09 DIAGNOSIS — E1169 Type 2 diabetes mellitus with other specified complication: Secondary | ICD-10-CM | POA: Diagnosis not present

## 2022-02-09 DIAGNOSIS — E785 Hyperlipidemia, unspecified: Secondary | ICD-10-CM | POA: Diagnosis not present

## 2022-02-09 DIAGNOSIS — I10 Essential (primary) hypertension: Secondary | ICD-10-CM | POA: Diagnosis not present

## 2022-02-12 DIAGNOSIS — E1169 Type 2 diabetes mellitus with other specified complication: Secondary | ICD-10-CM | POA: Diagnosis not present

## 2022-02-12 DIAGNOSIS — I10 Essential (primary) hypertension: Secondary | ICD-10-CM | POA: Diagnosis not present

## 2022-03-16 DIAGNOSIS — Z1231 Encounter for screening mammogram for malignant neoplasm of breast: Secondary | ICD-10-CM | POA: Diagnosis not present

## 2022-03-21 DIAGNOSIS — M6281 Muscle weakness (generalized): Secondary | ICD-10-CM | POA: Diagnosis not present

## 2022-03-21 DIAGNOSIS — M130 Polyarthritis, unspecified: Secondary | ICD-10-CM | POA: Diagnosis not present

## 2022-04-30 DIAGNOSIS — I739 Peripheral vascular disease, unspecified: Secondary | ICD-10-CM | POA: Diagnosis not present

## 2022-04-30 DIAGNOSIS — E1351 Other specified diabetes mellitus with diabetic peripheral angiopathy without gangrene: Secondary | ICD-10-CM | POA: Diagnosis not present

## 2022-04-30 DIAGNOSIS — B351 Tinea unguium: Secondary | ICD-10-CM | POA: Diagnosis not present

## 2022-06-12 DIAGNOSIS — E782 Mixed hyperlipidemia: Secondary | ICD-10-CM | POA: Diagnosis not present

## 2022-06-12 DIAGNOSIS — E1169 Type 2 diabetes mellitus with other specified complication: Secondary | ICD-10-CM | POA: Diagnosis not present

## 2022-06-12 DIAGNOSIS — I10 Essential (primary) hypertension: Secondary | ICD-10-CM | POA: Diagnosis not present

## 2022-06-13 DIAGNOSIS — E1169 Type 2 diabetes mellitus with other specified complication: Secondary | ICD-10-CM | POA: Diagnosis not present

## 2022-06-13 DIAGNOSIS — I10 Essential (primary) hypertension: Secondary | ICD-10-CM | POA: Diagnosis not present

## 2022-06-13 DIAGNOSIS — M1991 Primary osteoarthritis, unspecified site: Secondary | ICD-10-CM | POA: Diagnosis not present

## 2022-07-25 DIAGNOSIS — I10 Essential (primary) hypertension: Secondary | ICD-10-CM | POA: Diagnosis not present

## 2022-07-25 DIAGNOSIS — E1169 Type 2 diabetes mellitus with other specified complication: Secondary | ICD-10-CM | POA: Diagnosis not present

## 2022-07-25 DIAGNOSIS — M130 Polyarthritis, unspecified: Secondary | ICD-10-CM | POA: Diagnosis not present

## 2022-08-01 DIAGNOSIS — E1351 Other specified diabetes mellitus with diabetic peripheral angiopathy without gangrene: Secondary | ICD-10-CM | POA: Diagnosis not present

## 2022-08-01 DIAGNOSIS — I739 Peripheral vascular disease, unspecified: Secondary | ICD-10-CM | POA: Diagnosis not present

## 2022-08-01 DIAGNOSIS — E1151 Type 2 diabetes mellitus with diabetic peripheral angiopathy without gangrene: Secondary | ICD-10-CM | POA: Diagnosis not present

## 2022-08-01 DIAGNOSIS — B351 Tinea unguium: Secondary | ICD-10-CM | POA: Diagnosis not present

## 2022-08-07 DIAGNOSIS — M25561 Pain in right knee: Secondary | ICD-10-CM | POA: Diagnosis not present

## 2022-08-07 DIAGNOSIS — M25551 Pain in right hip: Secondary | ICD-10-CM | POA: Diagnosis not present

## 2022-08-07 DIAGNOSIS — M25552 Pain in left hip: Secondary | ICD-10-CM | POA: Diagnosis not present

## 2022-11-01 DIAGNOSIS — I739 Peripheral vascular disease, unspecified: Secondary | ICD-10-CM | POA: Diagnosis not present

## 2022-11-01 DIAGNOSIS — E1151 Type 2 diabetes mellitus with diabetic peripheral angiopathy without gangrene: Secondary | ICD-10-CM | POA: Diagnosis not present

## 2022-11-01 DIAGNOSIS — E1351 Other specified diabetes mellitus with diabetic peripheral angiopathy without gangrene: Secondary | ICD-10-CM | POA: Diagnosis not present

## 2022-11-27 DIAGNOSIS — E119 Type 2 diabetes mellitus without complications: Secondary | ICD-10-CM | POA: Diagnosis not present

## 2022-11-27 DIAGNOSIS — I10 Essential (primary) hypertension: Secondary | ICD-10-CM | POA: Diagnosis not present

## 2022-11-27 DIAGNOSIS — E7800 Pure hypercholesterolemia, unspecified: Secondary | ICD-10-CM | POA: Diagnosis not present

## 2022-11-28 DIAGNOSIS — Z0000 Encounter for general adult medical examination without abnormal findings: Secondary | ICD-10-CM | POA: Diagnosis not present

## 2022-11-28 DIAGNOSIS — E1169 Type 2 diabetes mellitus with other specified complication: Secondary | ICD-10-CM | POA: Diagnosis not present

## 2022-11-28 DIAGNOSIS — M1991 Primary osteoarthritis, unspecified site: Secondary | ICD-10-CM | POA: Diagnosis not present

## 2022-11-28 DIAGNOSIS — I10 Essential (primary) hypertension: Secondary | ICD-10-CM | POA: Diagnosis not present

## 2022-11-28 DIAGNOSIS — E785 Hyperlipidemia, unspecified: Secondary | ICD-10-CM | POA: Diagnosis not present

## 2023-01-31 DIAGNOSIS — E1151 Type 2 diabetes mellitus with diabetic peripheral angiopathy without gangrene: Secondary | ICD-10-CM | POA: Diagnosis not present

## 2023-01-31 DIAGNOSIS — B351 Tinea unguium: Secondary | ICD-10-CM | POA: Diagnosis not present

## 2023-03-19 DIAGNOSIS — Z1231 Encounter for screening mammogram for malignant neoplasm of breast: Secondary | ICD-10-CM | POA: Diagnosis not present

## 2023-04-29 DIAGNOSIS — E0800 Diabetes mellitus due to underlying condition with hyperosmolarity without nonketotic hyperglycemic-hyperosmolar coma (NKHHC): Secondary | ICD-10-CM | POA: Diagnosis not present

## 2023-04-29 DIAGNOSIS — E7800 Pure hypercholesterolemia, unspecified: Secondary | ICD-10-CM | POA: Diagnosis not present

## 2023-04-29 DIAGNOSIS — I10 Essential (primary) hypertension: Secondary | ICD-10-CM | POA: Diagnosis not present

## 2023-04-29 DIAGNOSIS — M130 Polyarthritis, unspecified: Secondary | ICD-10-CM | POA: Diagnosis not present

## 2023-04-29 DIAGNOSIS — G4730 Sleep apnea, unspecified: Secondary | ICD-10-CM | POA: Diagnosis not present

## 2023-04-29 DIAGNOSIS — E1169 Type 2 diabetes mellitus with other specified complication: Secondary | ICD-10-CM | POA: Diagnosis not present

## 2023-04-29 DIAGNOSIS — E1100 Type 2 diabetes mellitus with hyperosmolarity without nonketotic hyperglycemic-hyperosmolar coma (NKHHC): Secondary | ICD-10-CM | POA: Diagnosis not present

## 2023-05-01 DIAGNOSIS — E1151 Type 2 diabetes mellitus with diabetic peripheral angiopathy without gangrene: Secondary | ICD-10-CM | POA: Diagnosis not present

## 2023-05-09 DIAGNOSIS — M25552 Pain in left hip: Secondary | ICD-10-CM | POA: Diagnosis not present

## 2023-07-03 ENCOUNTER — Ambulatory Visit: Payer: Self-pay | Admitting: Emergency Medicine

## 2023-07-03 DIAGNOSIS — M25552 Pain in left hip: Secondary | ICD-10-CM | POA: Diagnosis not present

## 2023-07-03 DIAGNOSIS — G8929 Other chronic pain: Secondary | ICD-10-CM

## 2023-07-03 NOTE — H&P (View-Only) (Signed)
 TOTAL HIP ADMISSION H&P  Patient is admitted for left total hip arthroplasty.  Subjective:  Chief Complaint: left hip pain  HPI: Angie Carr, 69 y.o. female, has a history of pain and functional disability in the left hip(s) due to arthritis and patient has failed non-surgical conservative treatments for greater than 12 weeks to include NSAID's and/or analgesics, supervised PT with diminished ADL's post treatment, use of assistive devices, and activity modification.  Onset of symptoms was gradual starting >10 years ago with gradually worsening course since that time.The patient noted no past surgery on the left hip(s).  Patient currently rates pain in the left hip at 10 out of 10 with activity. Patient has night pain, worsening of pain with activity and weight bearing, pain that interfers with activities of daily living, and pain with passive range of motion. Patient has evidence of periarticular osteophytes, joint space narrowing, and significant erosion of femoral head by imaging studies. This condition presents safety issues increasing the risk of falls.  There is no current active infection.  Patient Active Problem List   Diagnosis Date Noted   Febrile illness 12/11/2014   Severe sepsis (HCC) 12/11/2014   Hypotension 12/11/2014   Acute kidney injury (HCC) 12/11/2014   Trichomonal vaginitis    Absolute anemia    Current occasional smoker 11/04/2012   Diabetes mellitus, type 2 (HCC) 03/18/2012   Morbid obesity (HCC) 03/18/2012   Hypertension associated with diabetes (HCC) 03/18/2012   Hyperlipidemia LDL goal <70 03/18/2012   Arthritis 03/18/2012   Past Medical History:  Diagnosis Date   Asthma    pt denies   Diabetes mellitus without complication (HCC)    DJD (degenerative joint disease)    lower back   Headache    miagraines, none in years   Hypertension    LBP (low back pain)    Morbid obesity (HCC)    Primary osteoarthritis of both knees    Smoker     Past Surgical  History:  Procedure Laterality Date   ABDOMINAL HYSTERECTOMY     complete   CHOLECYSTECTOMY     COLONOSCOPY N/A 02/11/2014   Procedure: COLONOSCOPY;  Surgeon: Lamar Donnald GAILS, MD;  Location: [facility];  Service: Endoscopy;  Laterality: N/A;   HERNIA REPAIR     TUBAL LIGATION      Current Outpatient Medications  Medication Sig Dispense Refill Last Dose/Taking   amLODipine  (NORVASC ) 10 MG tablet TAKE 1 TABLET DAILY 90 tablet 2    aspirin  81 MG tablet Take 81 mg by mouth daily.      cefUROXime  (CEFTIN ) 500 MG tablet Take 1 tablet (500 mg total) by mouth 2 (two) times daily with a meal. 20 tablet 0    etodolac  (LODINE ) 500 MG tablet Take 1 tablet (500 mg total) by mouth 2 (two) times daily. 180 tablet 3    etodolac  (LODINE ) 500 MG tablet TAKE 1 TABLET BY MOUTH TWICE DAILY (Patient not taking: Reported on 01/27/2014) 60 tablet 1    GLYXAMBI 25-5 MG TABS Take 1 tablet by mouth daily.  0    lisinopril -hydrochlorothiazide  (PRINZIDE ,ZESTORETIC ) 20-12.5 MG per tablet TAKE 1 TABLET TWICE A DAY 180 tablet 2    metFORMIN  (GLUCOPHAGE ) 1000 MG tablet Take 1 tablet (1,000 mg total) by mouth 2 (two) times daily with a meal. 180 tablet 0    omeprazole  (PRILOSEC) 20 MG capsule TAKE 1 CAPSULE DAILY 90 capsule 3    oxyCODONE -acetaminophen  (PERCOCET/ROXICET) 5-325 MG tablet Take 1 tablet by mouth every 6 (  six) hours as needed for severe pain. 11 tablet 0    potassium chloride  SA (K-DUR,KLOR-CON ) 20 MEQ tablet Take 20 mEq by mouth daily.      pravastatin  (PRAVACHOL ) 20 MG tablet TAKE 1 TABLET DAILY 90 tablet 2    vitamin B-12 (CYANOCOBALAMIN ) 1000 MCG tablet Take 1 tablet (1,000 mcg total) by mouth daily. 30 tablet 0    No current facility-administered medications for this visit.   No Known Allergies  Social History   Tobacco Use   Smoking status: Some Days    Current packs/day: 0.50    Types: Cigarettes   Smokeless tobacco: Never  Substance Use Topics   Alcohol use: No    Family History  Problem  Relation Age of Onset   Hypertension Other    Stroke Other      Review of Systems  Musculoskeletal:  Positive for arthralgias.  All other systems reviewed and are negative.   Objective:  Physical Exam Constitutional:      General: She is not in acute distress.    Appearance: Normal appearance. She is obese. She is not ill-appearing.  HENT:     Head: Normocephalic and atraumatic.     Right Ear: External ear normal.     Left Ear: External ear normal.     Nose: Nose normal.     Mouth/Throat:     Mouth: Mucous membranes are moist.     Pharynx: Oropharynx is clear.  Eyes:     Extraocular Movements: Extraocular movements intact.     Conjunctiva/sclera: Conjunctivae normal.  Cardiovascular:     Rate and Rhythm: Normal rate and regular rhythm.     Pulses: Normal pulses.     Heart sounds: Normal heart sounds.  Pulmonary:     Effort: Pulmonary effort is normal.     Breath sounds: Normal breath sounds.  Abdominal:     General: Bowel sounds are normal.     Palpations: Abdomen is soft.     Tenderness: There is no abdominal tenderness.  Musculoskeletal:        General: Tenderness present.     Cervical back: Normal range of motion and neck supple.     Comments: TTP over groin, lateral aspect, greater trochanter.  Mild IT band tenderness.  No significant swelling.  No overlying lesions of area of chief complaint.  Decreased strength and ROM due to elicited pain.  Dorsiflexion and plantarflexion intact.  BLE appear grossly neurovascularly intact.  Gait antalgic with use of rollator.   Skin:    General: Skin is warm and dry.  Neurological:     Mental Status: She is alert and oriented to person, place, and time. Mental status is at baseline.  Psychiatric:        Mood and Affect: Mood normal.        Behavior: Behavior normal.     Vital signs in last 24 hours: @VSRANGES @  Labs:   Estimated body mass index is 48.89 kg/m as calculated from the following:   Height as of 12/11/14:  5' 4 (1.626 m).   Weight as of 12/14/14: 129.2 kg.   Imaging Review Plain radiographs demonstrate severe degenerative joint disease of the left hip(s). The bone quality appears to be fair for age and reported activity level.      Assessment/Plan:  End stage arthritis, left hip(s)  The patient history, physical examination, clinical judgement of the provider and imaging studies are consistent with end stage degenerative joint disease of the left hip(s) and  total hip arthroplasty is deemed medically necessary. The treatment options including medical management, injection therapy, arthroscopy and arthroplasty were discussed at length. The risks and benefits of total hip arthroplasty were presented and reviewed. The risks due to aseptic loosening, infection, stiffness, dislocation/subluxation,  thromboembolic complications and other imponderables were discussed.  The patient acknowledged the explanation, agreed to proceed with the plan and consent was signed. Patient is being admitted for inpatient treatment for surgery, pain control, PT, OT, prophylactic antibiotics, VTE prophylaxis, progressive ambulation and ADL's and discharge planning.The patient is planning to be discharged with outpatient PT.    Patient's anticipated LOS is less than 2 midnights, meeting these requirements: - Lives within 1 hour of care - Has a competent adult at home to recover with post-op recover - NO history of  - Chronic pain requiring opiods  - Coronary Artery Disease  - Heart failure  - Heart attack  - Stroke  - DVT/VTE  - Cardiac arrhythmia  - Respiratory Failure/COPD  - Renal failure  - Anemia  - Advanced Liver disease

## 2023-07-03 NOTE — H&P (Signed)
 TOTAL HIP ADMISSION H&P  Patient is admitted for left total hip arthroplasty.  Subjective:  Chief Complaint: left hip pain  HPI: Angie Carr, 69 y.o. female, has a history of pain and functional disability in the left hip(s) due to arthritis and patient has failed non-surgical conservative treatments for greater than 12 weeks to include NSAID's and/or analgesics, supervised PT with diminished ADL's post treatment, use of assistive devices, and activity modification.  Onset of symptoms was gradual starting >10 years ago with gradually worsening course since that time.The patient noted no past surgery on the left hip(s).  Patient currently rates pain in the left hip at 10 out of 10 with activity. Patient has night pain, worsening of pain with activity and weight bearing, pain that interfers with activities of daily living, and pain with passive range of motion. Patient has evidence of periarticular osteophytes, joint space narrowing, and significant erosion of femoral head by imaging studies. This condition presents safety issues increasing the risk of falls.  There is no current active infection.  Patient Active Problem List   Diagnosis Date Noted   Febrile illness 12/11/2014   Severe sepsis (HCC) 12/11/2014   Hypotension 12/11/2014   Acute kidney injury (HCC) 12/11/2014   Trichomonal vaginitis    Absolute anemia    Current occasional smoker 11/04/2012   Diabetes mellitus, type 2 (HCC) 03/18/2012   Morbid obesity (HCC) 03/18/2012   Hypertension associated with diabetes (HCC) 03/18/2012   Hyperlipidemia LDL goal <70 03/18/2012   Arthritis 03/18/2012   Past Medical History:  Diagnosis Date   Asthma    pt denies   Diabetes mellitus without complication (HCC)    DJD (degenerative joint disease)    lower back   Headache    miagraines, none in years   Hypertension    LBP (low back pain)    Morbid obesity (HCC)    Primary osteoarthritis of both knees    Smoker     Past Surgical  History:  Procedure Laterality Date   ABDOMINAL HYSTERECTOMY     complete   CHOLECYSTECTOMY     COLONOSCOPY N/A 02/11/2014   Procedure: COLONOSCOPY;  Surgeon: Lamar Donnald GAILS, MD;  Location: [facility];  Service: Endoscopy;  Laterality: N/A;   HERNIA REPAIR     TUBAL LIGATION      Current Outpatient Medications  Medication Sig Dispense Refill Last Dose/Taking   amLODipine  (NORVASC ) 10 MG tablet TAKE 1 TABLET DAILY 90 tablet 2    aspirin  81 MG tablet Take 81 mg by mouth daily.      cefUROXime  (CEFTIN ) 500 MG tablet Take 1 tablet (500 mg total) by mouth 2 (two) times daily with a meal. 20 tablet 0    etodolac  (LODINE ) 500 MG tablet Take 1 tablet (500 mg total) by mouth 2 (two) times daily. 180 tablet 3    etodolac  (LODINE ) 500 MG tablet TAKE 1 TABLET BY MOUTH TWICE DAILY (Patient not taking: Reported on 01/27/2014) 60 tablet 1    GLYXAMBI 25-5 MG TABS Take 1 tablet by mouth daily.  0    lisinopril -hydrochlorothiazide  (PRINZIDE ,ZESTORETIC ) 20-12.5 MG per tablet TAKE 1 TABLET TWICE A DAY 180 tablet 2    metFORMIN  (GLUCOPHAGE ) 1000 MG tablet Take 1 tablet (1,000 mg total) by mouth 2 (two) times daily with a meal. 180 tablet 0    omeprazole  (PRILOSEC) 20 MG capsule TAKE 1 CAPSULE DAILY 90 capsule 3    oxyCODONE -acetaminophen  (PERCOCET/ROXICET) 5-325 MG tablet Take 1 tablet by mouth every 6 (  six) hours as needed for severe pain. 11 tablet 0    potassium chloride  SA (K-DUR,KLOR-CON ) 20 MEQ tablet Take 20 mEq by mouth daily.      pravastatin  (PRAVACHOL ) 20 MG tablet TAKE 1 TABLET DAILY 90 tablet 2    vitamin B-12 (CYANOCOBALAMIN ) 1000 MCG tablet Take 1 tablet (1,000 mcg total) by mouth daily. 30 tablet 0    No current facility-administered medications for this visit.   No Known Allergies  Social History   Tobacco Use   Smoking status: Some Days    Current packs/day: 0.50    Types: Cigarettes   Smokeless tobacco: Never  Substance Use Topics   Alcohol use: No    Family History  Problem  Relation Age of Onset   Hypertension Other    Stroke Other      Review of Systems  Musculoskeletal:  Positive for arthralgias.  All other systems reviewed and are negative.   Objective:  Physical Exam Constitutional:      General: She is not in acute distress.    Appearance: Normal appearance. She is obese. She is not ill-appearing.  HENT:     Head: Normocephalic and atraumatic.     Right Ear: External ear normal.     Left Ear: External ear normal.     Nose: Nose normal.     Mouth/Throat:     Mouth: Mucous membranes are moist.     Pharynx: Oropharynx is clear.  Eyes:     Extraocular Movements: Extraocular movements intact.     Conjunctiva/sclera: Conjunctivae normal.  Cardiovascular:     Rate and Rhythm: Normal rate and regular rhythm.     Pulses: Normal pulses.     Heart sounds: Normal heart sounds.  Pulmonary:     Effort: Pulmonary effort is normal.     Breath sounds: Normal breath sounds.  Abdominal:     General: Bowel sounds are normal.     Palpations: Abdomen is soft.     Tenderness: There is no abdominal tenderness.  Musculoskeletal:        General: Tenderness present.     Cervical back: Normal range of motion and neck supple.     Comments: TTP over groin, lateral aspect, greater trochanter.  Mild IT band tenderness.  No significant swelling.  No overlying lesions of area of chief complaint.  Decreased strength and ROM due to elicited pain.  Dorsiflexion and plantarflexion intact.  BLE appear grossly neurovascularly intact.  Gait antalgic with use of rollator.   Skin:    General: Skin is warm and dry.  Neurological:     Mental Status: She is alert and oriented to person, place, and time. Mental status is at baseline.  Psychiatric:        Mood and Affect: Mood normal.        Behavior: Behavior normal.     Vital signs in last 24 hours: @VSRANGES @  Labs:   Estimated body mass index is 48.89 kg/m as calculated from the following:   Height as of 12/11/14:  5' 4 (1.626 m).   Weight as of 12/14/14: 129.2 kg.   Imaging Review Plain radiographs demonstrate severe degenerative joint disease of the left hip(s). The bone quality appears to be fair for age and reported activity level.      Assessment/Plan:  End stage arthritis, left hip(s)  The patient history, physical examination, clinical judgement of the provider and imaging studies are consistent with end stage degenerative joint disease of the left hip(s) and  total hip arthroplasty is deemed medically necessary. The treatment options including medical management, injection therapy, arthroscopy and arthroplasty were discussed at length. The risks and benefits of total hip arthroplasty were presented and reviewed. The risks due to aseptic loosening, infection, stiffness, dislocation/subluxation,  thromboembolic complications and other imponderables were discussed.  The patient acknowledged the explanation, agreed to proceed with the plan and consent was signed. Patient is being admitted for inpatient treatment for surgery, pain control, PT, OT, prophylactic antibiotics, VTE prophylaxis, progressive ambulation and ADL's and discharge planning.The patient is planning to be discharged with outpatient PT.    Patient's anticipated LOS is less than 2 midnights, meeting these requirements: - Lives within 1 hour of care - Has a competent adult at home to recover with post-op recover - NO history of  - Chronic pain requiring opiods  - Coronary Artery Disease  - Heart failure  - Heart attack  - Stroke  - DVT/VTE  - Cardiac arrhythmia  - Respiratory Failure/COPD  - Renal failure  - Anemia  - Advanced Liver disease

## 2023-07-10 NOTE — Patient Instructions (Signed)
 SURGICAL WAITING ROOM VISITATION  Patients having surgery or a procedure may have no more than 2 support people in the waiting area - these visitors may rotate.    Children under the age of 42 must have an adult with them who is not the patient.  Visitors with respiratory illnesses are discouraged from visiting and should remain at home.  If the patient needs to stay at the hospital during part of their recovery, the visitor guidelines for inpatient rooms apply. Pre-op nurse will coordinate an appropriate time for 1 support person to accompany patient in pre-op.  This support person may not rotate.    Please refer to the [facility] website for the visitor guidelines for Inpatients (after your surgery is over and you are in a regular room).       Your procedure is scheduled on:  07/24/23    Report to [facility] Main Entrance    Report to admitting at  0515 AM   Call this number if you have problems the morning of surgery [phone]   Do not eat food :After Midnight.   After Midnight you may have the following liquids until _ 0430_____ Am  DAY OF SURGERY  Water Non-Citrus Juices (without pulp, NO RED-Apple, White grape, White cranberry) Black Coffee (NO MILK/CREAM OR CREAMERS, sugar ok)  Clear Tea (NO MILK/CREAM OR CREAMERS, sugar ok) regular and decaf                             Plain Jell-O (NO RED)                                           Fruit ices (not with fruit pulp, NO RED)                                     Popsicles (NO RED)                                                               Sports drinks like Gatorade (NO RED)                    The day of surgery:  Drink ONE (1) Pre-Surgery Clear Ensure or G2 at  0430AM ( have completed by )  the morning of surgery. Drink in one sitting. Do not sip.  This drink was given to you during your hospital  pre-op appointment visit. Nothing else to drink after completing the  Pre-Surgery Clear Ensure or G2.           If you have questions, please contact your surgeon's office.       Oral Hygiene is also important to reduce your risk of infection.                                    Remember - BRUSH YOUR TEETH THE MORNING OF SURGERY WITH YOUR REGULAR TOOTHPASTE  DENTURES WILL BE REMOVED PRIOR TO SURGERY PLEASE  DO NOT APPLY Poly grip OR ADHESIVES!!!   Do NOT smoke after Midnight   Stop all vitamins and herbal supplements 7 days before surgery.   Take these medicines the morning of surgery with A SIP OF WATER:  amlodipine , omeprazole , gabapentin,                 Metformin - none am of surgery   DO NOT TAKE ANY ORAL DIABETIC MEDICATIONS DAY OF YOUR SURGERY  Bring CPAP mask and tubing day of surgery.                              You may not have any metal on your body including hair pins, jewelry, and body piercing             Do not wear make-up, lotions, powders, perfumes/cologne, or deodorant  Do not wear nail polish including gel and S&S, artificial/acrylic nails, or any other type of covering on natural nails including finger and toenails. If you have artificial nails, gel coating, etc. that needs to be removed by a nail salon please have this removed prior to surgery or surgery may need to be canceled/ delayed if the surgeon/ anesthesia feels like they are unable to be safely monitored.   Do not shave  48 hours prior to surgery.               Men may shave face and neck.   Do not bring valuables to the hospital. [facility] IS NOT             RESPONSIBLE   FOR VALUABLES.   Contacts, glasses, dentures or bridgework may not be worn into surgery.   Bring small overnight bag day of surgery.   DO NOT BRING YOUR HOME MEDICATIONS TO THE HOSPITAL. PHARMACY WILL DISPENSE MEDICATIONS LISTED ON YOUR MEDICATION LIST TO YOU DURING YOUR ADMISSION IN THE HOSPITAL!    Patients discharged on the day of surgery will not be allowed to drive home.  Someone NEEDS to stay with you for the first 24 hours  after anesthesia.   Special Instructions: Bring a copy of your healthcare power of attorney and living will documents the day of surgery if you haven't scanned them before.              Please read over the following fact sheets you were given: IF YOU HAVE QUESTIONS ABOUT YOUR PRE-OP INSTRUCTIONS PLEASE CALL [phone]   If you received a COVID test during your pre-op visit  it is requested that you wear a mask when out in public, stay away from anyone that may not be feeling well and notify your surgeon if you develop symptoms. If you test positive for Covid or have been in contact with anyone that has tested positive in the last 10 days please notify you surgeon.      Pre-operative 5 CHG Bath Instructions   You can play a key role in reducing the risk of infection after surgery. Your skin needs to be as free of germs as possible. You can reduce the number of germs on your skin by washing with CHG (chlorhexidine gluconate) soap before surgery. CHG is an antiseptic soap that kills germs and continues to kill germs even after washing.   DO NOT use if you have an allergy to chlorhexidine/CHG or antibacterial soaps. If your skin becomes reddened or irritated, stop using the CHG and notify one of our RNs  at [phone].   Please shower with the CHG soap starting 4 days before surgery using the following schedule:     Please keep in mind the following:  DO NOT shave, including legs and underarms, starting the day of your first shower.   You may shave your face at any point before/day of surgery.  Place clean sheets on your bed the day you start using CHG soap. Use a clean washcloth (not used since being washed) for each shower. DO NOT sleep with pets once you start using the CHG.   CHG Shower Instructions:  If you choose to wash your hair and private area, wash first with your normal shampoo/soap.  After you use shampoo/soap, rinse your hair and body thoroughly to remove shampoo/soap  residue.  Turn the water OFF and apply about 3 tablespoons (45 ml) of CHG soap to a CLEAN washcloth.  Apply CHG soap ONLY FROM YOUR NECK DOWN TO YOUR TOES (washing for 3-5 minutes)  DO NOT use CHG soap on face, private areas, open wounds, or sores.  Pay special attention to the area where your surgery is being performed.  If you are having back surgery, having someone wash your back for you may be helpful. Wait 2 minutes after CHG soap is applied, then you may rinse off the CHG soap.  Pat dry with a clean towel  Put on clean clothes/pajamas   If you choose to wear lotion, please use ONLY the CHG-compatible lotions on the back of this paper.     Additional instructions for the day of surgery: DO NOT APPLY any lotions, deodorants, cologne, or perfumes.   Put on clean/comfortable clothes.  Brush your teeth.  Ask your nurse before applying any prescription medications to the skin.      CHG Compatible Lotions   Aveeno Moisturizing lotion  Cetaphil Moisturizing Cream  Cetaphil Moisturizing Lotion  Clairol Herbal Essence Moisturizing Lotion, Dry Skin  Clairol Herbal Essence Moisturizing Lotion, Extra Dry Skin  Clairol Herbal Essence Moisturizing Lotion, Normal Skin  Curel Age Defying Therapeutic Moisturizing Lotion with Alpha Hydroxy  Curel Extreme Care Body Lotion  Curel Soothing Hands Moisturizing Hand Lotion  Curel Therapeutic Moisturizing Cream, Fragrance-Free  Curel Therapeutic Moisturizing Lotion, Fragrance-Free  Curel Therapeutic Moisturizing Lotion, Original Formula  Eucerin Daily Replenishing Lotion  Eucerin Dry Skin Therapy Plus Alpha Hydroxy Crme  Eucerin Dry Skin Therapy Plus Alpha Hydroxy Lotion  Eucerin Original Crme  Eucerin Original Lotion  Eucerin Plus Crme Eucerin Plus Lotion  Eucerin TriLipid Replenishing Lotion  Keri Anti-Bacterial Hand Lotion  Keri Deep Conditioning Original Lotion Dry Skin Formula Softly Scented  Keri Deep Conditioning Original Lotion,  Fragrance Free Sensitive Skin Formula  Keri Lotion Fast Absorbing Fragrance Free Sensitive Skin Formula  Keri Lotion Fast Absorbing Softly Scented Dry Skin Formula  Keri Original Lotion  Keri Skin Renewal Lotion Keri Silky Smooth Lotion  Keri Silky Smooth Sensitive Skin Lotion  Nivea Body Creamy Conditioning Oil  Nivea Body Extra Enriched Teacher, adult education Moisturizing Lotion Nivea Crme  Nivea Skin Firming Lotion  NutraDerm 30 Skin Lotion  NutraDerm Skin Lotion  NutraDerm Therapeutic Skin Cream  NutraDerm Therapeutic Skin Lotion  ProShield Protective Hand Cream  Provon moisturizing lotion

## 2023-07-10 NOTE — Progress Notes (Signed)
 Anesthesia Review:  PCP: Cardiologist :  PPM/ ICD: Device Orders: Rep Notified:  Chest x-ray : EKG : Echo : 2016  Stress test: Cardiac Cath :   Activity level:  Sleep Study/ CPAP : Fasting Blood Sugar :      / Checks Blood Sugar -- times a day:    Blood Thinner/ Instructions /Last Dose: ASA / Instructions/ Last Dose :

## 2023-07-10 NOTE — Care Plan (Signed)
 Ortho Bundle Case Management Note  Patient Details  Name: Angie Carr MRN: [id number] Date of Birth: Apr 16, 1955   met with patient in the office for H&P. will discharge to home with family to assist. rolling walker ordered. OPPT set up with [facility]. discharge instructions discussed and questions answered. Patient and MD in agreement with plan. Choice offered.              DME Arranged:    DME Agency:     HH Arranged:    HH Agency:     Additional Comments: Please contact me with any questions of if this plan should need to change.  Charlies Pitch,  RN,BSN,MHA,CCM  [facility]  [phone] 07/10/2023, 4:39 PM

## 2023-07-15 ENCOUNTER — Encounter (HOSPITAL_COMMUNITY)
Admission: RE | Admit: 2023-07-15 | Discharge: 2023-07-15 | Disposition: A | Discharge status: Home / Self Care | Source: Ambulatory Visit | Attending: Family Medicine | Admitting: Family Medicine

## 2023-07-18 NOTE — Patient Instructions (Signed)
 SURGICAL WAITING ROOM VISITATION Patients having surgery or a procedure may have no more than 2 support people in the waiting area - these visitors may rotate in the visitor waiting room.   If the patient needs to stay at the hospital during part of their recovery, the visitor guidelines for inpatient rooms apply.  PRE-OP VISITATION  Pre-op nurse will coordinate an appropriate time for 1 support person to accompany the patient in pre-op.  This support person may not rotate.  This visitor will be contacted when the time is appropriate for the visitor to come back in the pre-op area.  Please refer to the [facility] website for the visitor guidelines for Inpatients (after your surgery is over and you are in a regular room).  You are not required to quarantine at this time prior to your surgery. However, you must do this: Hand Hygiene often Do NOT share personal items Notify your provider if you are in close contact with someone who has COVID or you develop fever 100.4 or greater, new onset of sneezing, cough, sore throat, shortness of breath or body aches.  If you test positive for Covid or have been in contact with anyone that has tested positive in the last 10 days please notify you surgeon.    Your procedure is scheduled on:  [facility]  July 24, 2023  Report to [facility] Main Entrance: [address] entrance where the [employer] is available.   Report to admitting at: 05:15    AM  Call this number if you have any questions or problems the morning of surgery [phone]  Do not eat food after Midnight the night prior to your surgery/procedure.  After Midnight you may have the following liquids until   04:30  AM DAY OF SURGERY  Clear Liquid Diet Water Black Coffee (sugar ok, NO MILK/CREAM OR CREAMERS)  Tea (sugar ok, NO MILK/CREAM OR CREAMERS) regular and decaf                             Plain Jell-O  with no fruit (NO RED)                                           Fruit  ices (not with fruit pulp, NO RED)                                     Popsicles (NO RED)                                                                  Juice: NO CITRUS JUICES: only apple, WHITE grape, WHITE cranberry Sports drinks like Gatorade or Powerade (NO RED)                   The day of surgery:  Drink ONE (1) Pre-Surgery G2 at   04:30 AM the morning of surgery. Drink in one sitting. Do not sip.  This drink was given to you during your hospital pre-op appointment visit. Nothing else to drink  after completing the Pre-Surgery G2 : No candy, chewing gum or throat lozenges.    FOLLOW ANY ADDITIONAL PRE OP INSTRUCTIONS YOU RECEIVED FROM YOUR SURGEON'S OFFICE!!!   Oral Hygiene is also important to reduce your risk of infection.        Remember - BRUSH YOUR TEETH THE MORNING OF SURGERY WITH YOUR REGULAR TOOTHPASTE  Do NOT smoke after Midnight the night before surgery.  STOP TAKING all Vitamins, Herbs and supplements 1 week before your surgery.   Take ONLY these medicines the morning of surgery with A SIP OF WATER: amlodipine , Omeprazole , gabapentin.   DO NOT TAKE Toviaz or Irbesartan/ HCTZ on the day of surgery.                     You may not have any metal on your body including hair pins, jewelry, and body piercing  Do not wear make-up, lotions, powders, perfumes  or deodorant  Do not wear nail polish including gel and S&S, artificial / acrylic nails, or any other type of covering on natural nails including finger and toenails. If you have artificial nails, gel coating, etc., that needs to be removed by a nail salon, Please have this removed prior to surgery. Not doing so may mean that your surgery could be cancelled or delayed if the Surgeon or anesthesia staff feels like they are unable to monitor you safely.   Do not shave 48 hours prior to surgery to avoid nicks in your skin which may contribute to postoperative infections.   Contacts, Hearing Aids, dentures or  bridgework may not be worn into surgery. DENTURES WILL BE REMOVED PRIOR TO SURGERY PLEASE DO NOT APPLY Poly grip OR ADHESIVES!!!  You may bring a small overnight bag with you on the day of surgery, only pack items that are not valuable. [facility] IS NOT RESPONSIBLE   FOR VALUABLES THAT ARE LOST OR STOLEN.   Patients discharged on the day of surgery will not be allowed to drive home.  Someone NEEDS to stay with you for the first 24 hours after anesthesia.  Do not bring your home medications to the hospital. The Pharmacy will dispense medications listed on your medication list to you during your admission in the Hospital.  Please read over the following fact sheets you were given: IF YOU HAVE QUESTIONS ABOUT YOUR PRE-OP INSTRUCTIONS, PLEASE CALL [phone].     Pre-operative 5 CHG Bath Instructions   You can play a key role in reducing the risk of infection after surgery. Your skin needs to be as free of germs as possible. You can reduce the number of germs on your skin by washing with CHG (chlorhexidine gluconate) soap before surgery. CHG is an antiseptic soap that kills germs and continues to kill germs even after washing.   DO NOT use if you have an allergy to chlorhexidine/CHG or antibacterial soaps. If your skin becomes reddened or irritated, stop using the CHG and notify one of our RNs at [phone]  Please shower with the CHG soap starting 4 days before surgery using the following schedule: START SHOWERS ON  SATURDAY  July 20, 2023  Please keep in mind the following:  DO NOT shave, including legs and underarms, starting the day of your first shower.   You may shave your face at any point before/day of surgery.   Place clean sheets on your bed the day you start using CHG soap. Use a clean washcloth (not used  since being washed) for each shower. DO NOT sleep with pets once you start using the CHG.   CHG Shower Instructions:  If you choose to wash your hair and private area, wash first with your normal shampoo/soap.  After you use shampoo/soap, rinse your hair and body thoroughly to remove shampoo/soap residue.  Turn the water OFF and apply about 3 tablespoons (45 ml) of CHG soap to a CLEAN washcloth.  Apply CHG soap ONLY FROM YOUR NECK DOWN TO YOUR TOES (washing for 3-5 minutes)  DO NOT use CHG soap on face, private areas, open wounds, or sores.  Pay special attention to the area where your surgery is being performed.  If you are having back surgery, having someone wash your back for you may be helpful.  Wait 2 minutes after CHG soap is applied, then you may rinse off the CHG soap.  Pat dry with a clean towel  Put on clean clothes/pajamas   If you choose to wear lotion, please use ONLY the CHG-compatible lotions on the back of this paper.     Additional instructions for the day of surgery: DO NOT APPLY any lotions, deodorants, cologne, or perfumes.   Put on clean/comfortable clothes.  Brush your teeth.  Ask your nurse before applying any prescription medications to the skin.      CHG Compatible Lotions   Aveeno Moisturizing lotion  Cetaphil Moisturizing Cream  Cetaphil Moisturizing Lotion  Clairol Herbal Essence Moisturizing Lotion, Dry Skin  Clairol Herbal Essence Moisturizing Lotion, Extra Dry Skin  Clairol Herbal Essence Moisturizing Lotion, Normal Skin  Curel Age Defying Therapeutic Moisturizing Lotion with Alpha Hydroxy  Curel Extreme Care Body Lotion  Curel Soothing Hands Moisturizing Hand Lotion  Curel Therapeutic Moisturizing Cream, Fragrance-Free  Curel Therapeutic Moisturizing Lotion, Fragrance-Free  Curel Therapeutic Moisturizing Lotion, Original Formula  Eucerin Daily Replenishing Lotion  Eucerin Dry Skin Therapy Plus Alpha Hydroxy Crme  Eucerin Dry Skin Therapy  Plus Alpha Hydroxy Lotion  Eucerin Original Crme  Eucerin Original Lotion  Eucerin Plus Crme Eucerin Plus Lotion  Eucerin TriLipid Replenishing Lotion  Keri Anti-Bacterial Hand Lotion  Keri Deep Conditioning Original Lotion Dry Skin Formula Softly Scented  Keri Deep Conditioning Original Lotion, Fragrance Free Sensitive Skin Formula  Keri Lotion Fast Absorbing Fragrance Free Sensitive Skin Formula  Keri Lotion Fast Absorbing Softly Scented Dry Skin Formula  Keri Original Lotion  Keri Skin Renewal Lotion Keri Silky Smooth Lotion  Keri Silky Smooth Sensitive Skin Lotion  Nivea Body Creamy Conditioning Oil  Nivea Body Extra Enriched Lotion  Nivea Body Original Lotion  Nivea Body Sheer Moisturizing Lotion Nivea Crme  Nivea Skin Firming Lotion  NutraDerm 30 Skin Lotion  NutraDerm Skin Lotion  NutraDerm Therapeutic Skin Cream  NutraDerm Therapeutic Skin Lotion  ProShield Protective Hand Cream  Provon moisturizing lotion   FAILURE TO FOLLOW THESE INSTRUCTIONS MAY RESULT IN THE CANCELLATION OF YOUR SURGERY  PATIENT SIGNATURE_________________________________  NURSE SIGNATURE__________________________________  ________________________________________________________________________         Angie Carr    An incentive spirometer is a tool that can help keep your lungs clear and active. This tool measures how well you are filling your lungs with each  breath. Taking long deep breaths may help reverse or decrease the chance of developing breathing (pulmonary) problems (especially infection) following: A long period of time when you are unable to move or be active. BEFORE THE PROCEDURE  If the spirometer includes an indicator to show your best effort, your nurse or respiratory therapist will set it to a desired goal. If possible, sit up straight or lean slightly forward. Try not to slouch. Hold the incentive spirometer in an upright position. INSTRUCTIONS FOR USE  Sit  on the edge of your bed if possible, or sit up as far as you can in bed or on a chair. Hold the incentive spirometer in an upright position. Breathe out normally. Place the mouthpiece in your mouth and seal your lips tightly around it. Breathe in slowly and as deeply as possible, raising the piston or the ball toward the top of the column. Hold your breath for 3-5 seconds or for as long as possible. Allow the piston or ball to fall to the bottom of the column. Remove the mouthpiece from your mouth and breathe out normally. Rest for a few seconds and repeat Steps 1 through 7 at least 10 times every 1-2 hours when you are awake. Take your time and take a few normal breaths between deep breaths. The spirometer may include an indicator to show your best effort. Use the indicator as a goal to work toward during each repetition. After each set of 10 deep breaths, practice coughing to be sure your lungs are clear. If you have an incision (the cut made at the time of surgery), support your incision when coughing by placing a pillow or rolled up towels firmly against it. Once you are able to get out of bed, walk around indoors and cough well. You may stop using the incentive spirometer when instructed by your caregiver.  RISKS AND COMPLICATIONS Take your time so you do not get dizzy or light-headed. If you are in pain, you may need to take or ask for pain medication before doing incentive spirometry. It is harder to take a deep breath if you are having pain. AFTER USE Rest and breathe slowly and easily. It can be helpful to keep track of a log of your progress. Your caregiver can provide you with a simple table to help with this. If you are using the spirometer at home, follow these instructions: SEEK MEDICAL CARE IF:  You are having difficultly using the spirometer. You have trouble using the spirometer as often as instructed. Your pain medication is not giving enough relief while using the  spirometer. You develop fever of 100.5 F (38.1 C) or higher.                                                                                                    SEEK IMMEDIATE MEDICAL CARE IF:  You cough up bloody sputum that had not been present before. You develop fever of 102 F (38.9 C) or greater. You develop worsening pain at or near the incision site. MAKE SURE YOU:  Understand these instructions. Will watch  your condition. Will get help right away if you are not doing well or get worse. Document Released: 04/30/2006 Document Revised: 03/12/2011 Document Reviewed: 07/01/2006 [facility] Patient Information 2014 [place], [place].         WHAT IS A BLOOD TRANSFUSION? Blood Transfusion Information  A transfusion is the replacement of blood or some of its parts. Blood is made up of multiple cells which provide different functions. Red blood cells carry oxygen and are used for blood loss replacement. White blood cells fight against infection. Platelets control bleeding. Plasma helps clot blood. Other blood products are available for specialized needs, such as hemophilia or other clotting disorders. BEFORE THE TRANSFUSION  Who gives blood for transfusions?  Healthy volunteers who are fully evaluated to make sure their blood is safe. This is blood bank blood. Transfusion therapy is the safest it has ever been in the practice of medicine. Before blood is taken from a donor, a complete history is taken to make sure that person has no history of diseases nor engages in risky social behavior (examples are intravenous drug use or sexual activity with multiple partners). The donor's travel history is screened to minimize risk of transmitting infections, such as malaria. The donated blood is tested for signs of infectious diseases, such as HIV and hepatitis. The blood is then tested to be sure it is compatible with you in order to minimize the chance of a transfusion reaction. If you or a  relative donates blood, this is often done in anticipation of surgery and is not appropriate for emergency situations. It takes many days to process the donated blood. RISKS AND COMPLICATIONS Although transfusion therapy is very safe and saves many lives, the main dangers of transfusion include:  Getting an infectious disease. Developing a transfusion reaction. This is an allergic reaction to something in the blood you were given. Every precaution is taken to prevent this. The decision to have a blood transfusion has been considered carefully by your caregiver before blood is given. Blood is not given unless the benefits outweigh the risks. AFTER THE TRANSFUSION Right after receiving a blood transfusion, you will usually feel much better and more energetic. This is especially true if your red blood cells have gotten low (anemic). The transfusion raises the level of the red blood cells which carry oxygen, and this usually causes an energy increase. The nurse administering the transfusion will monitor you carefully for complications. HOME CARE INSTRUCTIONS  No special instructions are needed after a transfusion. You may find your energy is better. Speak with your caregiver about any limitations on activity for underlying diseases you may have. SEEK MEDICAL CARE IF:  Your condition is not improving after your transfusion. You develop redness or irritation at the intravenous (IV) site. SEEK IMMEDIATE MEDICAL CARE IF:  Any of the following symptoms occur over the next 12 hours: Shaking chills. You have a temperature by mouth above 102 F (38.9 C), not controlled by medicine. Chest, back, or muscle pain. People around you feel you are not acting correctly or are confused. Shortness of breath or difficulty breathing. Dizziness and fainting. You get a rash or develop hives. You have a decrease in urine output. Your urine turns a dark color or changes to pink, red, or brown. Any of the following  symptoms occur over the next 10 days: You have a temperature by mouth above 102 F (38.9 C), not controlled by medicine. Shortness of breath. Weakness after normal activity. The white part of the eye turns  yellow (jaundice). You have a decrease in the amount of urine or are urinating less often. Your urine turns a dark color or changes to pink, red, or brown. Document Released: 12/16/1999 Document Revised: 03/12/2011 Document Reviewed: 08/04/2007 [facility] Patient Information 2014 [place], [place].  _______________________________________________________________________       If you would like to see a video about joint replacement:   [email or web address]

## 2023-07-18 NOTE — Progress Notes (Signed)
 COVID Vaccine received:  []  No [x]  Yes Date of any COVID positive Test in last 90 days:  PCP - Kennieth Leech, MD  medical clearance scanned to [facility] Cardiologist - none  Chest x-ray - 12-11-2014  2v Epic EKG - 2016   will repeat  Stress Test -  ECHO - 12-12-2014  Epic Cardiac Cath -  CT Coronary Calcium score:   Pacemaker / ICD device [x]  No []  Yes   Spinal Cord Stimulator:[x]  No []  Yes       History of Sleep Apnea? []  No []  Yes   CPAP used?- []  No []  Yes    Does the patient monitor blood sugar?   []  N/A   []  No []  Yes  Patient has: []  NO Hx DM   []  Pre-DM   []  DM1  [x]   DM2 Last A1c was:        on      Does patient have a [employer] or Dexcom? []  No []  Yes   Fasting Blood Sugar Ranges-  Checks Blood Sugar _____ times a day  Blood Thinner / Instructions:  Cilostazol   ???? Aspirin  Instructions:  ASA 81 mg  ERAS Protocol Ordered: []  No  []  Yes PRE-SURGERY []  ENSURE  []  G2   []  No Drink Ordered  Patient is to be NPO after:   Dental hx: [x]  Dentures: teeth pulled, waiting on  []  N/A      []  Bridge or Partial:                   []  Loose or Damaged teeth:   Comments: Patient was given the 5 CHG shower / bath instructions for THA surgery along with 2 bottles of the CHG soap. Patient will start this on:   07-20-23           Activity level: Able to walk up 2 flights of stairs without becoming significantly short of breath or having chest pain?  []  No   []    Yes   Anesthesia review: DM2, HTN, Anemia, somedays smoker, asthma  Patient denies any S&S of respiratory illness or Covid - no shortness of breath, fever, cough or chest pain at PAT appointment.  Patient verbalized understanding and agreement to the Pre-Surgical Instructions that were given to them at this PAT appointment. Patient was also educated of the need to review these PAT instructions again prior to /her surgery.I reviewed the appropriate phone numbers to call if they have any and questions or concerns.

## 2023-07-19 ENCOUNTER — Other Ambulatory Visit: Payer: Self-pay

## 2023-07-19 ENCOUNTER — Encounter (HOSPITAL_COMMUNITY): Payer: Self-pay

## 2023-07-19 ENCOUNTER — Encounter (HOSPITAL_COMMUNITY)
Admission: RE | Admit: 2023-07-19 | Discharge: 2023-07-19 | Disposition: A | Discharge status: Home / Self Care | Source: Ambulatory Visit | Attending: Orthopedic Surgery | Admitting: Orthopedic Surgery

## 2023-07-19 VITALS — BP 114/66 | HR 86 | Temp 97.9°F | Resp 20 | Ht 64.0 in | Wt 232.0 lb

## 2023-07-19 DIAGNOSIS — M1990 Unspecified osteoarthritis, unspecified site: Secondary | ICD-10-CM | POA: Insufficient documentation

## 2023-07-19 DIAGNOSIS — Z01812 Encounter for preprocedural laboratory examination: Secondary | ICD-10-CM | POA: Diagnosis present

## 2023-07-19 DIAGNOSIS — Z01818 Encounter for other preprocedural examination: Secondary | ICD-10-CM | POA: Diagnosis not present

## 2023-07-19 DIAGNOSIS — R9431 Abnormal electrocardiogram [ECG] [EKG]: Secondary | ICD-10-CM | POA: Insufficient documentation

## 2023-07-19 DIAGNOSIS — E119 Type 2 diabetes mellitus without complications: Secondary | ICD-10-CM | POA: Diagnosis not present

## 2023-07-19 DIAGNOSIS — M25552 Pain in left hip: Secondary | ICD-10-CM | POA: Diagnosis not present

## 2023-07-19 DIAGNOSIS — I10 Essential (primary) hypertension: Secondary | ICD-10-CM | POA: Diagnosis not present

## 2023-07-19 DIAGNOSIS — Z01810 Encounter for preprocedural cardiovascular examination: Secondary | ICD-10-CM | POA: Diagnosis present

## 2023-07-19 DIAGNOSIS — G8929 Other chronic pain: Secondary | ICD-10-CM | POA: Insufficient documentation

## 2023-07-19 HISTORY — DX: Gastro-esophageal reflux disease without esophagitis: K21.9

## 2023-07-19 HISTORY — DX: Anemia, unspecified: D64.9

## 2023-07-19 HISTORY — DX: Myoneural disorder, unspecified: G70.9

## 2023-07-19 HISTORY — DX: Anxiety disorder, unspecified: F41.9

## 2023-07-19 LAB — CBC WITH DIFFERENTIAL/PLATELET
Abs Immature Granulocytes: 0.02 K/uL (ref 0.00–0.07)
Basophils Absolute: 0.1 K/uL (ref 0.0–0.1)
Basophils Relative: 1 %
Eosinophils Absolute: 0.1 K/uL (ref 0.0–0.5)
Eosinophils Relative: 1 %
HCT: 40.6 % (ref 36.0–46.0)
Hemoglobin: 13.3 g/dL (ref 12.0–15.0)
Immature Granulocytes: 0 %
Lymphocytes Relative: 31 %
Lymphs Abs: 1.7 K/uL (ref 0.7–4.0)
MCH: 30.3 pg (ref 26.0–34.0)
MCHC: 32.8 g/dL (ref 30.0–36.0)
MCV: 92.5 fL (ref 80.0–100.0)
Monocytes Absolute: 0.4 K/uL (ref 0.1–1.0)
Monocytes Relative: 7 %
Neutro Abs: 3.4 K/uL (ref 1.7–7.7)
Neutrophils Relative %: 60 %
Platelets: 278 K/uL (ref 150–400)
RBC: 4.39 MIL/uL (ref 3.87–5.11)
RDW: 15.8 % — ABNORMAL HIGH (ref 11.5–15.5)
WBC: 5.6 K/uL (ref 4.0–10.5)
nRBC: 0 % (ref 0.0–0.2)

## 2023-07-19 LAB — COMPREHENSIVE METABOLIC PANEL WITH GFR
ALT: 8 U/L (ref 0–44)
AST: 12 U/L — ABNORMAL LOW (ref 15–41)
Albumin: 3.7 g/dL (ref 3.5–5.0)
Alkaline Phosphatase: 74 U/L (ref 38–126)
Anion gap: 9 (ref 5–15)
BUN: 10 mg/dL (ref 8–23)
CO2: 24 mmol/L (ref 22–32)
Calcium: 9.1 mg/dL (ref 8.9–10.3)
Chloride: 103 mmol/L (ref 98–111)
Creatinine, Ser: 0.7 mg/dL (ref 0.44–1.00)
GFR, Estimated: >60 mL/min (ref >60)
Glucose, Bld: 86 mg/dL (ref 70–99)
Potassium: 4.3 mmol/L (ref 3.5–5.1)
Sodium: 136 mmol/L (ref 135–145)
Total Bilirubin: 0.7 mg/dL (ref 0.0–1.2)
Total Protein: 7.2 g/dL (ref 6.5–8.1)

## 2023-07-19 LAB — HEMOGLOBIN A1C
Hgb A1c MFr Bld: 4.8 % (ref 4.8–5.6)
Mean Plasma Glucose: 91.06 mg/dL

## 2023-07-19 LAB — SURGICAL PCR SCREEN
MRSA, PCR: NEGATIVE
Staphylococcus aureus: NEGATIVE

## 2023-07-19 LAB — GLUCOSE, CAPILLARY: Glucose-Capillary: 84 mg/dL (ref 70–99)

## 2023-07-23 NOTE — Anesthesia Preprocedure Evaluation (Addendum)
 Anesthesia Evaluation  Patient identified by MRN, date of birth, ID band Patient awake    Reviewed: Allergy & Precautions, NPO status , Patient's Chart, lab work & pertinent test results  Airway Mallampati: II  TM Distance: >3 FB Neck ROM: Full    Dental no notable dental hx. (+) Missing, Dental Advisory Given,    Pulmonary asthma , Current Smoker and Patient abstained from smoking.   Pulmonary exam normal breath sounds clear to auscultation       Cardiovascular hypertension, Pt. on medications and Pt. on home beta blockers (-) angina (-) Past MI Normal cardiovascular exam Rhythm:Regular Rate:Normal     Neuro/Psych  Headaches  Anxiety      Neuromuscular disease    GI/Hepatic ,GERD  Medicated and Controlled,,  Endo/Other  diabetes, Type 2  Class 3 obesity  Renal/GU Renal diseaseLab Results      Component                Value               Date                                  K                        4.3                 07/19/2023                    CREATININE               0.70                07/19/2023                GFRNONAA                 >60                 07/19/2023                             Musculoskeletal  (+) Arthritis ,    Abdominal  (+) - obese  Peds  Hematology Lab Results      Component                Value               Date                      WBC                      5.6                 07/19/2023                HGB                      13.3                07/19/2023                HCT                      40.6  07/19/2023                MCV                      92.5                07/19/2023                PLT                      278                 07/19/2023              Anesthesia Other Findings   Reproductive/Obstetrics                              Anesthesia Physical Anesthesia Plan  ASA: 3  Anesthesia Plan: Spinal   Post-op Pain  Management: Ofirmev  IV (intra-op)* and Precedex   Induction:   PONV Risk Score and Plan: Propofol  infusion, Treatment may vary due to age or medical condition and Ondansetron   Airway Management Planned: Nasal Cannula  Additional Equipment: None  Intra-op Plan:   Post-operative Plan:   Informed Consent: I have reviewed the patients History and Physical, chart, labs and discussed the procedure including the risks, benefits and alternatives for the proposed anesthesia with the patient or authorized representative who has indicated his/her understanding and acceptance.     Dental advisory given  Plan Discussed with: CRNA and Surgeon  Anesthesia Plan Comments:          Anesthesia Quick Evaluation

## 2023-07-24 ENCOUNTER — Encounter (HOSPITAL_COMMUNITY): Admitting: Anesthesiology

## 2023-07-24 ENCOUNTER — Other Ambulatory Visit: Payer: Self-pay

## 2023-07-24 ENCOUNTER — Ambulatory Visit (HOSPITAL_COMMUNITY)
Admission: RE | Admit: 2023-07-24 | Discharge: 2023-07-24 | Disposition: A | Discharge status: Home / Self Care | Payer: Self-pay | Attending: Orthopedic Surgery | Admitting: Orthopedic Surgery

## 2023-07-24 ENCOUNTER — Ambulatory Visit (HOSPITAL_COMMUNITY)

## 2023-07-24 ENCOUNTER — Ambulatory Visit (HOSPITAL_COMMUNITY): Admitting: Anesthesiology

## 2023-07-24 ENCOUNTER — Encounter (HOSPITAL_COMMUNITY)
Admission: RE | Disposition: A | Discharge status: Home / Self Care | Payer: Self-pay | Source: Home / Self Care | Attending: Orthopedic Surgery

## 2023-07-24 ENCOUNTER — Encounter (HOSPITAL_COMMUNITY): Payer: Self-pay | Admitting: Orthopedic Surgery

## 2023-07-24 DIAGNOSIS — Z6839 Body mass index (BMI) 39.0-39.9, adult: Secondary | ICD-10-CM | POA: Insufficient documentation

## 2023-07-24 DIAGNOSIS — E119 Type 2 diabetes mellitus without complications: Secondary | ICD-10-CM

## 2023-07-24 DIAGNOSIS — Z471 Aftercare following joint replacement surgery: Secondary | ICD-10-CM | POA: Diagnosis not present

## 2023-07-24 DIAGNOSIS — M1612 Unilateral primary osteoarthritis, left hip: Secondary | ICD-10-CM | POA: Insufficient documentation

## 2023-07-24 DIAGNOSIS — Z01818 Encounter for other preprocedural examination: Secondary | ICD-10-CM

## 2023-07-24 DIAGNOSIS — K219 Gastro-esophageal reflux disease without esophagitis: Secondary | ICD-10-CM | POA: Insufficient documentation

## 2023-07-24 DIAGNOSIS — E66813 Obesity, class 3: Secondary | ICD-10-CM | POA: Insufficient documentation

## 2023-07-24 DIAGNOSIS — J45909 Unspecified asthma, uncomplicated: Secondary | ICD-10-CM | POA: Diagnosis not present

## 2023-07-24 DIAGNOSIS — F17210 Nicotine dependence, cigarettes, uncomplicated: Secondary | ICD-10-CM | POA: Diagnosis not present

## 2023-07-24 DIAGNOSIS — I10 Essential (primary) hypertension: Secondary | ICD-10-CM | POA: Diagnosis not present

## 2023-07-24 DIAGNOSIS — Z96642 Presence of left artificial hip joint: Secondary | ICD-10-CM | POA: Diagnosis not present

## 2023-07-24 DIAGNOSIS — Z7984 Long term (current) use of oral hypoglycemic drugs: Secondary | ICD-10-CM | POA: Insufficient documentation

## 2023-07-24 HISTORY — PX: TOTAL HIP ARTHROPLASTY: SHX124

## 2023-07-24 LAB — TYPE AND SCREEN
ABO/RH(D): O POS
Antibody Screen: NEGATIVE

## 2023-07-24 LAB — GLUCOSE, CAPILLARY
Comment 1: 12548877
Glucose-Capillary: 85 mg/dL (ref 70–99)
Glucose-Capillary: 97 mg/dL (ref 70–99)

## 2023-07-24 LAB — ABO/RH: ABO/RH(D): O POS

## 2023-07-24 SURGERY — ARTHROPLASTY, HIP, TOTAL,POSTERIOR APPROACH
Anesthesia: Spinal | Site: Hip | Laterality: Left

## 2023-07-24 MED ORDER — ISOPROPYL ALCOHOL 70 % SOLN
Status: DC | PRN
  Administered 2023-07-24: 1 via TOPICAL

## 2023-07-24 MED ORDER — OXYCODONE HCL 5 MG PO TABS
5.0000 mg | ORAL_TABLET | Freq: Once | ORAL | Status: AC | PRN
  Administered 2023-07-24: 5 mg via ORAL

## 2023-07-24 MED ORDER — LACTATED RINGERS IV BOLUS
250.0000 mL | Freq: Once | INTRAVENOUS | Status: AC
  Administered 2023-07-24: 250 mL via INTRAVENOUS

## 2023-07-24 MED ORDER — POLYETHYLENE GLYCOL 3350 17 G PO PACK
17.0000 g | PACK | Freq: Every day | ORAL | 0 refills | Status: DC
Start: 2023-07-24 — End: 2023-08-30

## 2023-07-24 MED ORDER — KETOROLAC TROMETHAMINE 15 MG/ML IJ SOLN
7.5000 mg | Freq: Four times a day (QID) | INTRAMUSCULAR | Status: DC
  Administered 2023-07-24: 7.5 mg via INTRAVENOUS

## 2023-07-24 MED ORDER — PROPOFOL 500 MG/50ML IV EMUL
INTRAVENOUS | Status: AC
Start: 2023-07-24 — End: 2023-07-24
  Filled 2023-07-24: qty 50

## 2023-07-24 MED ORDER — HYDROMORPHONE HCL 1 MG/ML IJ SOLN
0.2500 mg | INTRAMUSCULAR | Status: DC | PRN
  Administered 2023-07-24: 0.5 mg via INTRAVENOUS

## 2023-07-24 MED ORDER — LACTATED RINGERS IV SOLN: INTRAVENOUS | Status: DC

## 2023-07-24 MED ORDER — CELECOXIB 100 MG PO CAPS: 100.0000 mg | ORAL_CAPSULE | Freq: Two times a day (BID) | ORAL | 0 refills | Status: AC

## 2023-07-24 MED ORDER — SODIUM CHLORIDE (PF) 0.9 % IJ SOLN
INTRAMUSCULAR | Status: AC
  Filled 2023-07-24: qty 30

## 2023-07-24 MED ORDER — AMISULPRIDE (ANTIEMETIC) 5 MG/2ML IV SOLN: 10.0000 mg | Freq: Once | INTRAVENOUS | Status: DC | PRN

## 2023-07-24 MED ORDER — OXYCODONE HCL 5 MG PO TABS
ORAL_TABLET | ORAL | Status: AC
  Filled 2023-07-24: qty 1

## 2023-07-24 MED ORDER — DEXAMETHASONE SODIUM PHOSPHATE 10 MG/ML IJ SOLN: 4.0000 mg | Freq: Once | INTRAMUSCULAR | Status: DC

## 2023-07-24 MED ORDER — ORAL CARE MOUTH RINSE: 15.0000 mL | Freq: Once | OROMUCOSAL | Status: AC

## 2023-07-24 MED ORDER — BUPIVACAINE IN DEXTROSE 0.75-8.25 % IT SOLN
INTRATHECAL | Status: DC | PRN
Start: 2023-07-24 — End: 2023-07-24
  Administered 2023-07-24: 12 mg via INTRATHECAL

## 2023-07-24 MED ORDER — METHOCARBAMOL 500 MG PO TABS
ORAL_TABLET | ORAL | Status: AC
  Filled 2023-07-24: qty 1

## 2023-07-24 MED ORDER — PROPOFOL 1000 MG/100ML IV EMUL
INTRAVENOUS | Status: AC
Start: 2023-07-24 — End: 2023-07-24
  Filled 2023-07-24: qty 100

## 2023-07-24 MED ORDER — OXYCODONE HCL 5 MG/5ML PO SOLN: 5.0000 mg | Freq: Once | ORAL | Status: AC | PRN

## 2023-07-24 MED ORDER — ACETAMINOPHEN 500 MG PO TABS: 1000.0000 mg | ORAL_TABLET | Freq: Three times a day (TID) | ORAL | Status: AC | PRN

## 2023-07-24 MED ORDER — ASPIRIN 81 MG PO TBEC: 81.0000 mg | DELAYED_RELEASE_TABLET | Freq: Two times a day (BID) | ORAL | Status: AC

## 2023-07-24 MED ORDER — METHOCARBAMOL 500 MG PO TABS: 500.0000 mg | ORAL_TABLET | Freq: Three times a day (TID) | ORAL | 0 refills | Status: AC | PRN

## 2023-07-24 MED ORDER — CEFAZOLIN SODIUM-DEXTROSE 2-4 GM/100ML-% IV SOLN
2.0000 g | INTRAVENOUS | Status: AC
  Administered 2023-07-24: 2 g via INTRAVENOUS
  Filled 2023-07-24: qty 100

## 2023-07-24 MED ORDER — FENTANYL CITRATE (PF) 100 MCG/2ML IJ SOLN
INTRAMUSCULAR | Status: AC
  Filled 2023-07-24: qty 2

## 2023-07-24 MED ORDER — ISOPROPYL ALCOHOL 70 % SOLN
Status: AC
  Filled 2023-07-24: qty 480

## 2023-07-24 MED ORDER — POVIDONE-IODINE 10 % EX SWAB: 2.0000 | Freq: Once | CUTANEOUS | Status: DC

## 2023-07-24 MED ORDER — PROPOFOL 10 MG/ML IV BOLUS
INTRAVENOUS | Status: AC
  Filled 2023-07-24: qty 20

## 2023-07-24 MED ORDER — PROPOFOL 500 MG/50ML IV EMUL
INTRAVENOUS | Status: DC | PRN
  Administered 2023-07-24: 50 ug/kg/min via INTRAVENOUS

## 2023-07-24 MED ORDER — HYDROMORPHONE HCL 1 MG/ML IJ SOLN
INTRAMUSCULAR | Status: AC
  Filled 2023-07-24: qty 1

## 2023-07-24 MED ORDER — ONDANSETRON HCL 4 MG/2ML IJ SOLN
INTRAMUSCULAR | Status: AC
  Filled 2023-07-24: qty 2

## 2023-07-24 MED ORDER — INSULIN ASPART 100 UNIT/ML IJ SOLN: 0.0000 [IU] | INTRAMUSCULAR | Status: DC | PRN

## 2023-07-24 MED ORDER — ONDANSETRON HCL 4 MG PO TABS: 4.0000 mg | ORAL_TABLET | Freq: Three times a day (TID) | ORAL | 0 refills | Status: AC | PRN

## 2023-07-24 MED ORDER — TRANEXAMIC ACID-NACL 1000-0.7 MG/100ML-% IV SOLN
1000.0000 mg | INTRAVENOUS | Status: AC
  Administered 2023-07-24: 1000 mg via INTRAVENOUS
  Filled 2023-07-24: qty 100

## 2023-07-24 MED ORDER — ONDANSETRON HCL 4 MG/2ML IJ SOLN
INTRAMUSCULAR | Status: DC | PRN
  Administered 2023-07-24: 4 mg via INTRAVENOUS

## 2023-07-24 MED ORDER — ACETAMINOPHEN 10 MG/ML IV SOLN
1000.0000 mg | Freq: Once | INTRAVENOUS | Status: DC | PRN
Start: 2023-07-24 — End: 2023-07-24

## 2023-07-24 MED ORDER — MIDAZOLAM HCL 2 MG/2ML IJ SOLN
INTRAMUSCULAR | Status: DC | PRN
  Administered 2023-07-24 (×2): 1 mg via INTRAVENOUS

## 2023-07-24 MED ORDER — BUPIVACAINE-EPINEPHRINE (PF) 0.25% -1:200000 IJ SOLN
INTRAMUSCULAR | Status: AC
  Filled 2023-07-24: qty 30

## 2023-07-24 MED ORDER — CHLORHEXIDINE GLUCONATE 0.12 % MT SOLN
15.0000 mL | Freq: Once | OROMUCOSAL | Status: AC
  Administered 2023-07-24: 15 mL via OROMUCOSAL

## 2023-07-24 MED ORDER — 0.9 % SODIUM CHLORIDE (POUR BTL) OPTIME
TOPICAL | Status: DC | PRN
  Administered 2023-07-24: 1000 mL

## 2023-07-24 MED ORDER — DEXAMETHASONE SODIUM PHOSPHATE 10 MG/ML IJ SOLN
INTRAMUSCULAR | Status: AC
  Filled 2023-07-24: qty 1

## 2023-07-24 MED ORDER — OXYCODONE HCL 5 MG PO TABS: 5.0000 mg | ORAL_TABLET | ORAL | Status: DC | PRN

## 2023-07-24 MED ORDER — BUPIVACAINE LIPOSOME 1.3 % IJ SUSP
INTRAMUSCULAR | Status: AC
  Filled 2023-07-24: qty 20

## 2023-07-24 MED ORDER — SODIUM CHLORIDE 0.9 % IR SOLN
Status: DC | PRN
Start: 2023-07-24 — End: 2023-07-24
  Administered 2023-07-24: 1000 mL

## 2023-07-24 MED ORDER — FENTANYL CITRATE (PF) 100 MCG/2ML IJ SOLN
INTRAMUSCULAR | Status: DC | PRN
  Administered 2023-07-24: 100 ug via INTRAVENOUS

## 2023-07-24 MED ORDER — BUPIVACAINE LIPOSOME 1.3 % IJ SUSP: 10.0000 mL | Freq: Once | INTRAMUSCULAR | Status: DC

## 2023-07-24 MED ORDER — PROPOFOL 10 MG/ML IV BOLUS
INTRAVENOUS | Status: DC | PRN
  Administered 2023-07-24 (×5): 20 mg via INTRAVENOUS

## 2023-07-24 MED ORDER — DEXAMETHASONE SODIUM PHOSPHATE 10 MG/ML IJ SOLN
INTRAMUSCULAR | Status: DC | PRN
  Administered 2023-07-24: 10 mg via INTRAVENOUS

## 2023-07-24 MED ORDER — METHOCARBAMOL 1000 MG/10ML IJ SOLN: 500.0000 mg | Freq: Four times a day (QID) | INTRAMUSCULAR | Status: DC | PRN

## 2023-07-24 MED ORDER — METHOCARBAMOL 500 MG PO TABS
500.0000 mg | ORAL_TABLET | Freq: Four times a day (QID) | ORAL | Status: DC | PRN
  Administered 2023-07-24: 500 mg via ORAL

## 2023-07-24 MED ORDER — ACETAMINOPHEN 500 MG PO TABS
1000.0000 mg | ORAL_TABLET | Freq: Once | ORAL | Status: AC
  Administered 2023-07-24: 1000 mg via ORAL
  Filled 2023-07-24: qty 2

## 2023-07-24 MED ORDER — SODIUM CHLORIDE 0.9 % IV SOLN
INTRAVENOUS | Status: DC | PRN
  Administered 2023-07-24: 80 mL

## 2023-07-24 MED ORDER — SODIUM CHLORIDE 0.9 % IV SOLN: INTRAVENOUS | Status: DC

## 2023-07-24 MED ORDER — MIDAZOLAM HCL 2 MG/2ML IJ SOLN
INTRAMUSCULAR | Status: AC
  Filled 2023-07-24: qty 2

## 2023-07-24 MED ORDER — KETOROLAC TROMETHAMINE 15 MG/ML IJ SOLN
INTRAMUSCULAR | Status: AC
  Filled 2023-07-24: qty 1

## 2023-07-24 MED ORDER — LACTATED RINGERS IV BOLUS
500.0000 mL | Freq: Once | INTRAVENOUS | Status: AC
  Administered 2023-07-24: 500 mL via INTRAVENOUS

## 2023-07-24 MED ORDER — PHENYLEPHRINE HCL-NACL 20-0.9 MG/250ML-% IV SOLN
INTRAVENOUS | Status: DC | PRN
  Administered 2023-07-24: 50 ug/min via INTRAVENOUS

## 2023-07-24 MED ORDER — ONDANSETRON HCL 4 MG/2ML IJ SOLN: 4.0000 mg | Freq: Once | INTRAMUSCULAR | Status: DC | PRN

## 2023-07-24 MED ORDER — OXYCODONE HCL 5 MG PO TABS: 5.0000 mg | ORAL_TABLET | ORAL | 0 refills | Status: AC | PRN

## 2023-07-24 SURGICAL SUPPLY — 67 items
BAG COUNTER SPONGE SURGICOUNT (BAG) IMPLANT
BAG ZIPLOCK 12X15 (MISCELLANEOUS) ×1 IMPLANT
BIT DRILL TRIDENT 4X40 SU (BIT) IMPLANT
BLADE SAW SAG 25X90X1.19 (BLADE) ×1 IMPLANT
BRUSH FEMORAL CANAL (MISCELLANEOUS) IMPLANT
CHLORAPREP W/TINT 26 (MISCELLANEOUS) ×2 IMPLANT
CNTNR URN SCR LID CUP LEK RST (MISCELLANEOUS) ×1 IMPLANT
COVER SURGICAL LIGHT HANDLE (MISCELLANEOUS) ×1 IMPLANT
DERMABOND ADVANCED .7 DNX12 (GAUZE/BANDAGES/DRESSINGS) ×2 IMPLANT
DRAPE HIP W/POCKET STRL (MISCELLANEOUS) ×1 IMPLANT
DRAPE INCISE IOBAN 85X60 (DRAPES) ×1 IMPLANT
DRAPE POUCH INSTRU U-SHP 10X18 (DRAPES) ×1 IMPLANT
DRAPE SHEET LG 3/4 BI-LAMINATE (DRAPES) ×3 IMPLANT
DRAPE U-SHAPE 47X51 STRL (DRAPES) ×2 IMPLANT
DRSG AQUACEL AG ADV 3.5X10 (GAUZE/BANDAGES/DRESSINGS) ×1 IMPLANT
ELECT BLADE TIP CTD 4 INCH (ELECTRODE) ×1 IMPLANT
ELECT REM PT RETURN 15FT ADLT (MISCELLANEOUS) ×1 IMPLANT
FACESHIELD WRAPAROUND (MASK) ×1 IMPLANT
FACESHIELD WRAPAROUND OR TEAM (MASK) ×1 IMPLANT
GAUZE SPONGE 4X4 12PLY STRL (GAUZE/BANDAGES/DRESSINGS) ×1 IMPLANT
GLOVE BIO SURGEON STRL SZ 6.5 (GLOVE) ×2 IMPLANT
GLOVE BIOGEL PI IND STRL 6.5 (GLOVE) ×1 IMPLANT
GLOVE BIOGEL PI IND STRL 8 (GLOVE) ×1 IMPLANT
GLOVE SURG ORTHO 8.0 STRL STRW (GLOVE) ×2 IMPLANT
GOWN STRL REUS W/ TWL XL LVL3 (GOWN DISPOSABLE) ×2 IMPLANT
HEAD CERAMIC FEMORAL 36MM (Head) IMPLANT
HOLDER FOLEY CATH W/STRAP (MISCELLANEOUS) ×1 IMPLANT
HOOD PEEL AWAY T7 (MISCELLANEOUS) ×3 IMPLANT
INSERT TRIDENT POLY 36 0DEG (Insert) IMPLANT
KIT BASIN OR (CUSTOM PROCEDURE TRAY) ×1 IMPLANT
KIT TURNOVER KIT A (KITS) ×1 IMPLANT
MANIFOLD NEPTUNE II (INSTRUMENTS) ×1 IMPLANT
MARKER SKIN DUAL TIP RULER LAB (MISCELLANEOUS) ×1 IMPLANT
NDL SAFETY ECLIPSE 18X1.5 (NEEDLE) ×2 IMPLANT
NS IRRIG 1000ML POUR BTL (IV SOLUTION) ×1 IMPLANT
PACK TOTAL JOINT (CUSTOM PROCEDURE TRAY) ×1 IMPLANT
PAD ARMBOARD POSITIONER FOAM (MISCELLANEOUS) ×1 IMPLANT
PENCIL SMOKE EVACUATOR (MISCELLANEOUS) ×1 IMPLANT
PRESSURIZER FEMORAL UNIV (MISCELLANEOUS) IMPLANT
PROTECTOR NERVE ULNAR (MISCELLANEOUS) IMPLANT
RETRIEVER SUT HEWSON (MISCELLANEOUS) ×1 IMPLANT
SCREW HEX LP 6.5X25 (Screw) IMPLANT
SCREW HEX LP 6.5X30 (Screw) IMPLANT
SEALER BIPOLAR AQUA 6.0 (INSTRUMENTS) IMPLANT
SET HNDPC FAN SPRY TIP SCT (DISPOSABLE) IMPLANT
SHELL ACETABUL CLUSTER SZ 54 (Shell) IMPLANT
SOLUTION IRRIG SURGIPHOR (IV SOLUTION) IMPLANT
SOLUTION PRONTOSAN WOUND 350ML (IRRIGATION / IRRIGATOR) IMPLANT
SPIKE FLUID TRANSFER (MISCELLANEOUS) ×1 IMPLANT
STEM 37MM HIP (Hips) IMPLANT
SUCTION TUBE FRAZIER 12FR DISP (SUCTIONS) ×1 IMPLANT
SUT BONE WAX W31G (SUTURE) ×1 IMPLANT
SUT ETHIBOND #5 BRAIDED 30INL (SUTURE) ×1 IMPLANT
SUT MNCRL AB 3-0 PS2 18 (SUTURE) ×1 IMPLANT
SUT STRATAFIX 14 PDO 48 VLT (SUTURE) ×1 IMPLANT
SUT VIC AB 2-0 CT2 27 (SUTURE) ×2 IMPLANT
SUTURE STRATFX 0 PDS 27 VIOLET (SUTURE) ×1 IMPLANT
SYR 30ML LL (SYRINGE) ×1 IMPLANT
SYR 50ML LL SCALE MARK (SYRINGE) ×1 IMPLANT
TOWEL GREEN STERILE FF (TOWEL DISPOSABLE) ×1 IMPLANT
TOWEL OR 17X26 10 PK STRL BLUE (TOWEL DISPOSABLE) ×1 IMPLANT
TOWER CARTRIDGE SMART MIX (DISPOSABLE) IMPLANT
TRAY FOLEY MTR SLVR 14FR STAT (SET/KITS/TRAYS/PACK) IMPLANT
TRAY FOLEY MTR SLVR 16FR STAT (SET/KITS/TRAYS/PACK) IMPLANT
TUBE SUCTION HIGH CAP CLEAR NV (SUCTIONS) ×1 IMPLANT
UNDERPAD 30X36 HEAVY ABSORB (UNDERPADS AND DIAPERS) ×1 IMPLANT
WATER STERILE IRR 1000ML POUR (IV SOLUTION) ×2 IMPLANT

## 2023-07-24 NOTE — Discharge Instructions (Signed)

## 2023-07-24 NOTE — Interval H&P Note (Signed)
The patient has been re-examined, and the chart reviewed, and there have been no interval changes to the documented history and physical.    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   The risks, benefits, and alternatives have been discussed at length with patient, and the patient is willing to proceed.  Left hip marked. Consent has been signed.

## 2023-07-24 NOTE — Anesthesia Procedure Notes (Signed)
 Spinal  Patient location during procedure: OR Start time: 07/24/2023 7:29 AM End time: 07/24/2023 7:37 AM Reason for block: surgical anesthesia Staffing Performed: anesthesiologist  Anesthesiologist: Jefm Garnette LABOR, MD Performed by: Jefm Garnette LABOR, MD Authorized by: Jefm Garnette LABOR, MD   Preanesthetic Checklist Completed: patient identified, IV checked, risks and benefits discussed, surgical consent, monitors and equipment checked, pre-op evaluation and timeout performed Spinal Block Patient position: sitting Prep: DuraPrep and site prepped and draped Patient monitoring: heart rate, cardiac monitor, continuous pulse ox and blood pressure Approach: midline Location: L3-4 Injection technique: single-shot Needle Needle type: Pencan  Needle gauge: 24 G Needle length: 10 cm Needle insertion depth: 6 cm Assessment Sensory level: T4 Events: CSF return Additional Notes  3 Attempt (s). Pt tolerated procedure well.

## 2023-07-24 NOTE — Anesthesia Postprocedure Evaluation (Signed)
 Anesthesia Post Note  Patient: Angie Carr  Procedure(s) Performed: ARTHROPLASTY, HIP, TOTAL,POSTERIOR APPROACH (Left: Hip)     Patient location during evaluation: Nursing Unit Anesthesia Type: Spinal Level of consciousness: oriented and awake and alert Pain management: pain level controlled Vital Signs Assessment: post-procedure vital signs reviewed and stable Respiratory status: spontaneous breathing and respiratory function stable Cardiovascular status: blood pressure returned to baseline and stable Postop Assessment: no headache, no backache, no apparent nausea or vomiting and patient able to bend at knees Anesthetic complications: no   No notable events documented.  Last Vitals:  Vitals:   07/24/23 1000 07/24/23 1015  BP: 123/82 130/71  Pulse: 93 82  Resp: 17 19  Temp: (!) 36.3 C   SpO2: 98% 98%    Last Pain:  Vitals:   07/24/23 1030  TempSrc:   PainSc: 5                  Garnette DELENA Gab

## 2023-07-24 NOTE — Op Note (Signed)
 07/24/2023  9:17 AM  PATIENT:  Angie Carr   MRN: [id number]  PRE-OPERATIVE DIAGNOSIS: End-stage left hip osteoarthritis  POST-OPERATIVE DIAGNOSIS:  same  PROCEDURE: Left total hip arthroplasty   PREOPERATIVE INDICATIONS:    Angie Carr is an 68 y.o. female who has a diagnosis of  End-stage left hip osteoarthritis and elected for surgical management after failing conservative treatment.  The risks benefits and alternatives were discussed with the patient including but not limited to the risks of nonoperative treatment, versus surgical intervention including infection, bleeding, nerve injury, periprosthetic fracture, the need for revision surgery, dislocation, leg length discrepancy, blood clots, cardiopulmonary complications, morbidity, mortality, among others, and they were willing to proceed.     OPERATIVE REPORT     SURGEON:  Toribio Higashi, MD    ASSISTANT: Bernarda Mclean, PA-C, (Present throughout the entire procedure,  necessary for completion of procedure in a timely manner, assisting with retraction, instrumentation, and closure)     ANESTHESIA: Spinal  ESTIMATED BLOOD LOSS: 250cc    COMPLICATIONS:  None.     COMPONENTS:   Stryker 54 mm acetabular shell, 6.5 hex screws x 2, Accolade 2 size #7 stem with high offset, 36+0 ceramic head ball neutral X.3 polyethylene liner alpha code E Implant Name Type Inv. Item Serial No. Manufacturer Lot No. LRB No. Used Action  SHELL ACETABUL CLUSTER SZ 54 - [id number] Shell SHELL ACETABUL CLUSTER SZ 54  [employer] [id number] A Left 1 Implanted  SCREW HEX LP 6.5X30 - [id number] Screw SCREW HEX LP 6.5X30  [employer] [id number] Left 1 Implanted  SCREW HEX LP 6.5X25 - [id number] Screw SCREW HEX LP 6.5X25  [employer] MAUD Left 1 Implanted  INSERT TRIDENT POLY 36 0DEG - [id number] Insert INSERT TRIDENT POLY 36 0DEG  [employer] [id number] Left 1 Implanted  HEAD CERAMIC FEMORAL - [id number] Head HEAD  CERAMIC FEMORAL  [employer] [id number] Left 1 Implanted  STEM HIP - [id number] Hips STEM HIP  [employer] [id number] A Left 1 Implanted        PROCEDURE IN DETAIL:   The patient was met in the holding area and  identified.  The appropriate hip was identified and marked at the operative site.  The patient was then transported to the OR  and  placed under anesthesia.  At that point, the patient was  placed in the lateral decubitus position with the operative side up and  secured to the operating room table  and all bony prominences padded. A subaxillary role was also placed.    The operative lower extremity was prepped from the iliac crest to the distal leg.  Sterile draping was performed.  Preoperative antibiotics, 2 gm of ancef ,1 gm of Tranexamic Acid , and 8 mg of Decadron  administered. Time out was performed prior to incision.      A routine posterolateral approach was utilized via sharp dissection  carried down to the subcutaneous tissue.  Gross bleeders were Bovie coagulated.  The iliotibial band was identified and incised along the length of the skin incision through the glute max fascia.  Charnley retractor was placed with care to protect the sciatic nerve posteriorly.  With the hip internally rotated, the piriformis tendon was identified and released from the femoral insertion and tagged with a #5 Ethibond.  A capsulotomy was then performed off the femoral insertion and also tagged with a #5 Ethibond.    The femoral neck was exposed, and I resected the femoral neck based on preoperative templating  relative to the lesser trochanter.    I then exposed the deep acetabulum, cleared out any tissue including the ligamentum teres.  After adequate visualization, I excised the labrum.  I then started reaming with a 48 mm reamer, first medializing to the floor of the cotyloid fossa, and then in the position of the cup aiming towards the greater sciatic notch, matching the  version of the transverse acetabular ligament and tucked under the anterior wall. I reamed up to 54 mm reamer with good bony bed preparation and a 54 mm cup was chosen.  The real cup was then impacted into place.  Appropriate version and inclination was confirmed clinically matching their bony anatomy, and also with the use of the jig.  I placed 2 screws in the posterior superior quadrant to augment fixation.  A neutral liner was placed and impacted. It was confirmed to be appropriately seated and the acetabular retractors were removed.    I then prepared the proximal femur using the box cutter, Charnley awl, and then sequentially broached starting with 0 up to a size 7.  A trial broach, neck, and head was utilized, and I reduced the hip and it was found to have excellent stability.  There was no impingement with full extension and 90 degrees external rotation.  The hip was stable at the position of sleep and with 90 degrees flexion and 80 degrees of internal rotation.  Leg lengths were also clinically assessed in the lateral position and felt to be equal. Intra-Op flatplate was obtained and confirmed appropriate component positions.  Good fill of the femur with the size 7 broach.  And restoration of leg length and offset. No evidence or concern for fracture.  A final femoral prosthesis size 7 was selected. I then impacted the real femoral prosthesis into place.I again trialed and selected a 36+ 0mm ball. The hip was then reduced and taken through a range of motion. There was no impingement with full extension and 90 degrees external rotation.  The hip was stable at the position of sleep and with 90 degrees flexion and 80 degrees of internal rotation. Leg lengths were  again assessed and felt to be restored.  We then opened, and I impacted the real head ball into place.  The posterior capsule was then closed with #5 Ethibond.  The piriformis was repaired through the base of the abductor tendon using a  [place] suture passer.  I then irrigated the hip copiously with dilute Betadine  and with normal saline pulse lavage. Periarticular injection was then performed with Exparel .   We repaired the fascia #1 barbed suture, followed by 0 barbed suture for the subcutaneous fat.  Skin was closed with 2-0 Vicryl and 3-0 Monocryl.  Dermabond and Aquacel dressing were applied. The patient was then awakened and returned to PACU in stable and satisfactory condition.  Leg lengths in the supine position were assessed and felt to be clinically equal. There were no complications.  Post op recs: WB: WBAT LLE, No formal hip precautions Abx: ancef  Imaging: PACU pelvis Xray Dressing: Aquacell, keep intact until follow up DVT prophylaxis: Aspirin  81BID starting POD1 Follow up: 2 weeks after surgery for a wound check with Dr. Edna at [facility].  Address: [address], [place], [place] [address]  Office Phone: [phone]   Toribio Edna, MD Orthopedic Surgeon

## 2023-07-24 NOTE — Transfer of Care (Signed)
 Immediate Anesthesia Transfer of Care Note  Patient: Angie Carr  Procedure(s) Performed: ARTHROPLASTY, HIP, TOTAL,POSTERIOR APPROACH (Left: Hip)  Patient Location: PACU  Anesthesia Type:Spinal  Level of Consciousness: drowsy  Airway & Oxygen Therapy: Patient Spontanous Breathing and Patient connected to face mask oxygen  Post-op Assessment: Report given to RN and Post -op Vital signs reviewed and stable  Post vital signs: Reviewed and stable  Last Vitals:  Vitals Value Taken Time  BP 146/81 07/24/23 09:34  Temp    Pulse 92 07/24/23 09:35  Resp 14 07/24/23 09:35  SpO2 97 % 07/24/23 09:35  Vitals shown include unfiled device data.  Last Pain:  Vitals:   07/24/23 0600  TempSrc: Oral  PainSc:          Complications: No notable events documented.

## 2023-07-24 NOTE — Evaluation (Signed)
 Physical Therapy Brief Evaluation and Discharge Note Patient Details Name: Angie Carr MRN: [id number] DOB: 05/12/1955 Today's Date: 07/24/2023   History of Present Illness  68 yo female presents to therapy s/p L THA, posterior lateral approach on 07/24/2023 due to failure of conservative measures. Pt PMH includes but is not limited to: hypotension, AKI, anemia, tobacco abuse, DM II, HTN, HLD, arthritis, LBP and DJD.  Clinical Impression    Angie Carr is a 68 y.o. female POD 0 s/p L THA. Patient reports mod I with mobility at baseline. Patient is now limited by functional impairments (see PT problem list below) and requires CGA and cues for transfers and gait with RW. Patient was able to ambulate 55 feet with RW and CGA and cues for safe walker management. Patient educated on safe sequencing for functional mobility tasks, fall risk prevention, use of RW and how to adjust sons RW, pain management and goal, use of CP/Ice and car transfers pt and sister verbalized understanding of safe guarding position for people assisting with mobility. Patient instructed in exercises to facilitate ROM and circulation reviewed and HO provided. Patient will benefit from continued skilled PT interventions to address impairments and progress towards PLOF. Patient has met mobility goals at adequate level for discharge home with family support and OPPT services; will continue to follow if pt continues acute stay to progress towards Mod I goals.      PT Assessment    Assistance Needed at Discharge       Equipment Recommendations None recommended by PT (Son has RW and pt states insurance paied for rollator)  Recommendations for Other Services       Precautions/Restrictions Precautions Precautions: Fall Restrictions [employer] Bearing Restrictions Per Provider Order: No        Mobility  Bed Mobility          Transfers Overall transfer level: Needs assistance Equipment used: Rolling walker (2  wheels) Transfers: Sit to/from Stand Sit to Stand: Contact guard assist           General transfer comment: min cues for safety, RW management for bed, recliner and commode transfers    Ambulation/Gait Ambulation/Gait assistance: Contact guard assist Gait Distance (Feet): 55 Feet Assistive device: Rolling walker (2 wheels) Gait Pattern/deviations: Step-to pattern, Decreased stance time - left, Ataxic, Trunk flexed   General Gait Details: slight trunk flexion with B UE support to offload L LE at RW, min cues for posture, proper distance from RW, noted L toe in  Home Activity Instructions    Stairs            Modified Rankin (Stroke Patients Only)        Balance   Sitting-balance support: Feet supported Sitting balance-Leahy Scale: Good     Standing balance support: Bilateral upper extremity supported, During functional activity, Reliant on assistive device for balance Standing balance-Leahy Scale: Fair Standing balance comment: staic standing no UE support          Pertinent Vitals/Pain   Pain Assessment Pain Assessment: 0-10 Pain Score: 3  Pain Location: L LE and hip Pain Descriptors / Indicators: Aching, Dull, Operative site guarding, Grimacing, Constant Pain Intervention(s): Limited activity within patient's tolerance, Monitored during session, Premedicated before session, Repositioned, Ice applied     Home Living   Living Arrangements: Children       Home Equipment: Agricultural consultant (2 wheels);Cane - single point;Rollator (4 wheels)        Prior Function  UE/LE Assessment               Communication   Communication Communication: No apparent difficulties     Cognition         General Comments      Exercises Total Joint Exercises Ankle Circles/Pumps: AROM, Both, 10 reps Quad Sets: AROM, Left, 5 reps Heel Slides: AROM, Left, 5 reps Hip ABduction/ADduction: AROM, Left, 5 reps, Standing Long Arc Quad: AROM, Left, 5  reps, Standing Knee Flexion: AROM, Left, 5 reps, Standing Standing Hip Extension: AROM, Left, 5 reps, Standing   Assessment/Plan    PT Problem List Decreased strength;Decreased range of motion;Decreased activity tolerance;Decreased balance;Decreased mobility;Pain       PT Visit Diagnosis Unsteadiness on feet (R26.81);Other abnormalities of gait and mobility (R26.89);Muscle weakness (generalized) (M62.81);Difficulty in walking, not elsewhere classified (R26.2);Pain    No Skilled PT     Co-evaluation                AMPAC 6 Clicks Help needed turning from your back to your side while in a flat bed without using bedrails?: A Little Help needed moving from lying on your back to sitting on the side of a flat bed without using bedrails?: A Little Help needed moving to and from a bed to a chair (including a wheelchair)?: A Little Help needed standing up from a chair using your arms (e.g., wheelchair or bedside chair)?: A Little Help needed to walk in hospital room?: A Little Help needed climbing 3-5 steps with a railing? : Total 6 Click Score: 16      End of Session Equipment Utilized During Treatment: Gait belt Activity Tolerance: Patient tolerated treatment well;No increased pain Patient left: in chair;with call bell/phone within reach;with family/visitor present Nurse Communication: Mobility status;Other (comment) (pt readiness for d/c from PT standpoint) PT Visit Diagnosis: Unsteadiness on feet (R26.81);Other abnormalities of gait and mobility (R26.89);Muscle weakness (generalized) (M62.81);Difficulty in walking, not elsewhere classified (R26.2);Pain Pain - Right/Left: Left Pain - part of body: Leg;Hip     Time: 8751-8663 PT Time Calculation (min) (ACUTE ONLY): 48 min  Charges:   PT Evaluation $PT Eval Low Complexity: 1 Low PT Treatments $Gait Training: 8-22 mins $Therapeutic Exercise: 8-22 mins    [place], PT Acute Rehab   [place] VEAR Drone  07/24/2023, 2:24 PM

## 2023-07-25 ENCOUNTER — Encounter (HOSPITAL_COMMUNITY): Payer: Self-pay | Admitting: Orthopedic Surgery

## 2023-07-26 DIAGNOSIS — M1612 Unilateral primary osteoarthritis, left hip: Secondary | ICD-10-CM | POA: Diagnosis not present

## 2023-07-26 DIAGNOSIS — M25652 Stiffness of left hip, not elsewhere classified: Secondary | ICD-10-CM | POA: Diagnosis not present

## 2023-07-26 DIAGNOSIS — R269 Unspecified abnormalities of gait and mobility: Secondary | ICD-10-CM | POA: Diagnosis not present

## 2023-07-26 DIAGNOSIS — M6281 Muscle weakness (generalized): Secondary | ICD-10-CM | POA: Diagnosis not present

## 2023-07-26 DIAGNOSIS — Z96642 Presence of left artificial hip joint: Secondary | ICD-10-CM | POA: Diagnosis not present

## 2023-07-31 DIAGNOSIS — E1151 Type 2 diabetes mellitus with diabetic peripheral angiopathy without gangrene: Secondary | ICD-10-CM | POA: Diagnosis not present

## 2023-08-08 DIAGNOSIS — M1612 Unilateral primary osteoarthritis, left hip: Secondary | ICD-10-CM | POA: Diagnosis not present

## 2023-08-22 DIAGNOSIS — E1169 Type 2 diabetes mellitus with other specified complication: Secondary | ICD-10-CM | POA: Diagnosis not present

## 2023-08-22 DIAGNOSIS — R634 Abnormal weight loss: Secondary | ICD-10-CM | POA: Diagnosis not present

## 2023-08-22 DIAGNOSIS — R197 Diarrhea, unspecified: Secondary | ICD-10-CM | POA: Diagnosis not present

## 2023-08-22 DIAGNOSIS — I10 Essential (primary) hypertension: Secondary | ICD-10-CM | POA: Diagnosis not present

## 2023-08-22 DIAGNOSIS — M130 Polyarthritis, unspecified: Secondary | ICD-10-CM | POA: Diagnosis not present

## 2023-08-22 DIAGNOSIS — I9589 Other hypotension: Secondary | ICD-10-CM | POA: Diagnosis not present

## 2023-08-26 DIAGNOSIS — R197 Diarrhea, unspecified: Secondary | ICD-10-CM | POA: Diagnosis not present

## 2023-08-26 DIAGNOSIS — E1169 Type 2 diabetes mellitus with other specified complication: Secondary | ICD-10-CM | POA: Diagnosis not present

## 2023-08-26 DIAGNOSIS — I10 Essential (primary) hypertension: Secondary | ICD-10-CM | POA: Diagnosis not present

## 2023-08-26 DIAGNOSIS — I9589 Other hypotension: Secondary | ICD-10-CM | POA: Diagnosis not present

## 2023-08-28 ENCOUNTER — Encounter (HOSPITAL_COMMUNITY): Payer: Self-pay | Admitting: Radiology

## 2023-08-28 ENCOUNTER — Inpatient Hospital Stay (HOSPITAL_COMMUNITY)
Admission: EM | Admit: 2023-08-28 | Discharge: 2023-08-30 | DRG: 176 | Disposition: A | Discharge status: Home / Self Care | Attending: Family Medicine | Admitting: Family Medicine

## 2023-08-28 ENCOUNTER — Other Ambulatory Visit: Payer: Self-pay

## 2023-08-28 ENCOUNTER — Emergency Department (HOSPITAL_COMMUNITY)

## 2023-08-28 DIAGNOSIS — I2694 Multiple subsegmental pulmonary emboli without acute cor pulmonale: Principal | ICD-10-CM | POA: Diagnosis present

## 2023-08-28 DIAGNOSIS — E782 Mixed hyperlipidemia: Secondary | ICD-10-CM | POA: Diagnosis present

## 2023-08-28 DIAGNOSIS — R197 Diarrhea, unspecified: Secondary | ICD-10-CM | POA: Diagnosis not present

## 2023-08-28 DIAGNOSIS — N3001 Acute cystitis with hematuria: Secondary | ICD-10-CM | POA: Diagnosis present

## 2023-08-28 DIAGNOSIS — Z96642 Presence of left artificial hip joint: Secondary | ICD-10-CM | POA: Diagnosis present

## 2023-08-28 DIAGNOSIS — I152 Hypertension secondary to endocrine disorders: Secondary | ICD-10-CM | POA: Diagnosis present

## 2023-08-28 DIAGNOSIS — K219 Gastro-esophageal reflux disease without esophagitis: Secondary | ICD-10-CM | POA: Diagnosis not present

## 2023-08-28 DIAGNOSIS — M170 Bilateral primary osteoarthritis of knee: Secondary | ICD-10-CM | POA: Diagnosis present

## 2023-08-28 DIAGNOSIS — Z23 Encounter for immunization: Secondary | ICD-10-CM

## 2023-08-28 DIAGNOSIS — E1140 Type 2 diabetes mellitus with diabetic neuropathy, unspecified: Secondary | ICD-10-CM | POA: Diagnosis not present

## 2023-08-28 DIAGNOSIS — F1290 Cannabis use, unspecified, uncomplicated: Secondary | ICD-10-CM | POA: Diagnosis present

## 2023-08-28 DIAGNOSIS — Z720 Tobacco use: Secondary | ICD-10-CM

## 2023-08-28 DIAGNOSIS — Z1152 Encounter for screening for COVID-19: Secondary | ICD-10-CM

## 2023-08-28 DIAGNOSIS — E872 Acidosis, unspecified: Secondary | ICD-10-CM | POA: Diagnosis present

## 2023-08-28 DIAGNOSIS — Z79899 Other long term (current) drug therapy: Secondary | ICD-10-CM

## 2023-08-28 DIAGNOSIS — Z8249 Family history of ischemic heart disease and other diseases of the circulatory system: Secondary | ICD-10-CM

## 2023-08-28 DIAGNOSIS — E785 Hyperlipidemia, unspecified: Secondary | ICD-10-CM | POA: Diagnosis not present

## 2023-08-28 DIAGNOSIS — E860 Dehydration: Secondary | ICD-10-CM | POA: Diagnosis present

## 2023-08-28 DIAGNOSIS — Z90710 Acquired absence of both cervix and uterus: Secondary | ICD-10-CM

## 2023-08-28 DIAGNOSIS — E1151 Type 2 diabetes mellitus with diabetic peripheral angiopathy without gangrene: Secondary | ICD-10-CM | POA: Diagnosis not present

## 2023-08-28 DIAGNOSIS — F17210 Nicotine dependence, cigarettes, uncomplicated: Secondary | ICD-10-CM | POA: Diagnosis present

## 2023-08-28 DIAGNOSIS — R0602 Shortness of breath: Secondary | ICD-10-CM | POA: Diagnosis not present

## 2023-08-28 DIAGNOSIS — I2699 Other pulmonary embolism without acute cor pulmonale: Secondary | ICD-10-CM | POA: Diagnosis not present

## 2023-08-28 DIAGNOSIS — Z823 Family history of stroke: Secondary | ICD-10-CM

## 2023-08-28 DIAGNOSIS — E8729 Other acidosis: Secondary | ICD-10-CM

## 2023-08-28 DIAGNOSIS — I77810 Thoracic aortic ectasia: Secondary | ICD-10-CM | POA: Diagnosis not present

## 2023-08-28 DIAGNOSIS — J45909 Unspecified asthma, uncomplicated: Secondary | ICD-10-CM | POA: Diagnosis not present

## 2023-08-28 DIAGNOSIS — I739 Peripheral vascular disease, unspecified: Secondary | ICD-10-CM | POA: Insufficient documentation

## 2023-08-28 DIAGNOSIS — R7989 Other specified abnormal findings of blood chemistry: Secondary | ICD-10-CM | POA: Diagnosis not present

## 2023-08-28 DIAGNOSIS — K429 Umbilical hernia without obstruction or gangrene: Secondary | ICD-10-CM | POA: Diagnosis present

## 2023-08-28 DIAGNOSIS — E1159 Type 2 diabetes mellitus with other circulatory complications: Secondary | ICD-10-CM | POA: Diagnosis present

## 2023-08-28 DIAGNOSIS — Z7902 Long term (current) use of antithrombotics/antiplatelets: Secondary | ICD-10-CM | POA: Diagnosis not present

## 2023-08-28 DIAGNOSIS — I959 Hypotension, unspecified: Secondary | ICD-10-CM | POA: Diagnosis present

## 2023-08-28 DIAGNOSIS — E119 Type 2 diabetes mellitus without complications: Secondary | ICD-10-CM

## 2023-08-28 DIAGNOSIS — R1111 Vomiting without nausea: Secondary | ICD-10-CM | POA: Diagnosis not present

## 2023-08-28 DIAGNOSIS — R8290 Unspecified abnormal findings in urine: Secondary | ICD-10-CM | POA: Diagnosis present

## 2023-08-28 DIAGNOSIS — Z6839 Body mass index (BMI) 39.0-39.9, adult: Secondary | ICD-10-CM

## 2023-08-28 LAB — URINALYSIS, W/ REFLEX TO CULTURE (INFECTION SUSPECTED)
Glucose, UA: NEGATIVE mg/dL
Ketones, ur: 80 mg/dL — AB
Nitrite: NEGATIVE
Protein, ur: 100 mg/dL — AB
Specific Gravity, Urine: 1.023 (ref 1.005–1.030)
pH: 5 (ref 5.0–8.0)

## 2023-08-28 LAB — COMPREHENSIVE METABOLIC PANEL WITH GFR
ALT: 11 U/L (ref 0–44)
AST: 16 U/L (ref 15–41)
Albumin: 3.2 g/dL — ABNORMAL LOW (ref 3.5–5.0)
Alkaline Phosphatase: 105 U/L (ref 38–126)
Anion gap: 23 — ABNORMAL HIGH (ref 5–15)
BUN: 10 mg/dL (ref 8–23)
CO2: 12 mmol/L — ABNORMAL LOW (ref 22–32)
Calcium: 9.7 mg/dL (ref 8.9–10.3)
Chloride: 105 mmol/L (ref 98–111)
Creatinine, Ser: 0.94 mg/dL (ref 0.44–1.00)
GFR, Estimated: >60 mL/min (ref >60)
Glucose, Bld: 92 mg/dL (ref 70–99)
Potassium: 3.9 mmol/L (ref 3.5–5.1)
Sodium: 140 mmol/L (ref 135–145)
Total Bilirubin: 1.3 mg/dL — ABNORMAL HIGH (ref 0.0–1.2)
Total Protein: 7.5 g/dL (ref 6.5–8.1)

## 2023-08-28 LAB — CBC
HCT: 38.1 % (ref 36.0–46.0)
Hemoglobin: 12.3 g/dL (ref 12.0–15.0)
MCH: 30.4 pg (ref 26.0–34.0)
MCHC: 32.3 g/dL (ref 30.0–36.0)
MCV: 94.1 fL (ref 80.0–100.0)
Platelets: 342 K/uL (ref 150–400)
RBC: 4.05 MIL/uL (ref 3.87–5.11)
RDW: 17 % — ABNORMAL HIGH (ref 11.5–15.5)
WBC: 6.4 K/uL (ref 4.0–10.5)
nRBC: 0 % (ref 0.0–0.2)

## 2023-08-28 LAB — LIPASE, BLOOD: Lipase: 30 U/L (ref 11–51)

## 2023-08-28 LAB — RAPID URINE DRUG SCREEN, HOSP PERFORMED
Amphetamines: NOT DETECTED
Barbiturates: NOT DETECTED
Benzodiazepines: NOT DETECTED
Cocaine: NOT DETECTED
Opiates: NOT DETECTED
Tetrahydrocannabinol: POSITIVE — AB

## 2023-08-28 LAB — RESP PANEL BY RT-PCR (RSV, FLU A&B, COVID)  RVPGX2
Influenza A by PCR: NEGATIVE
Influenza B by PCR: NEGATIVE
Resp Syncytial Virus by PCR: NEGATIVE
SARS Coronavirus 2 by RT PCR: NEGATIVE

## 2023-08-28 LAB — TROPONIN I (HIGH SENSITIVITY): Troponin I (High Sensitivity): 8 ng/L (ref <18)

## 2023-08-28 LAB — LACTIC ACID, PLASMA: Lactic Acid, Venous: 1.8 mmol/L (ref 0.5–1.9)

## 2023-08-28 LAB — D-DIMER, QUANTITATIVE: D-Dimer, Quant: 5.16 ug{FEU}/mL — ABNORMAL HIGH (ref 0.00–0.50)

## 2023-08-28 MED ORDER — SODIUM CHLORIDE 0.9 % IV BOLUS
1000.0000 mL | Freq: Once | INTRAVENOUS | Status: AC
  Administered 2023-08-28: 1000 mL via INTRAVENOUS

## 2023-08-28 MED ORDER — IOHEXOL 350 MG/ML SOLN
75.0000 mL | Freq: Once | INTRAVENOUS | Status: AC | PRN
  Administered 2023-08-28: 75 mL via INTRAVENOUS

## 2023-08-28 MED ORDER — ONDANSETRON HCL 4 MG/2ML IJ SOLN
4.0000 mg | Freq: Once | INTRAMUSCULAR | Status: AC
  Administered 2023-08-28: 4 mg via INTRAVENOUS
  Filled 2023-08-28: qty 2

## 2023-08-28 MED ORDER — HEPARIN (PORCINE) 25000 UT/250ML-% IV SOLN
1400.0000 [IU]/h | INTRAVENOUS | Status: AC
  Administered 2023-08-29: 1400 [IU]/h via INTRAVENOUS
  Filled 2023-08-28 (×2): qty 250

## 2023-08-28 MED ORDER — HEPARIN BOLUS VIA INFUSION
6000.0000 [IU] | Freq: Once | INTRAVENOUS | Status: AC
  Administered 2023-08-29: 6000 [IU] via INTRAVENOUS
  Filled 2023-08-28: qty 6000

## 2023-08-28 MED ORDER — SODIUM CHLORIDE 0.9 % IV SOLN
1.0000 g | Freq: Once | INTRAVENOUS | Status: AC
  Administered 2023-08-28: 1 g via INTRAVENOUS
  Filled 2023-08-28: qty 10

## 2023-08-28 NOTE — ED Notes (Signed)
 Patient transported to CT

## 2023-08-28 NOTE — ED Triage Notes (Signed)
 Pt endorses for the past 30 days she has had decreased PO intake . Left hip replacement done 1 month ago that has healed well. Ambulating without difficulty.  She has not been able to taste anything since she had her surgery. Has been vomiting or dry heaving everyday. Has not been seen for this. She does smoke Marijuana and last smoked Monday. Denioes fever  Cbg 107 136/80 80 98%

## 2023-08-28 NOTE — ED Provider Triage Note (Addendum)
 Emergency Medicine Provider Triage Evaluation Note  ELECIA SERAFIN , a 68 y.o. female  was evaluated in triage.  Pt complains of fatigue and diarrhea. PT had a hip replacement surgery on 7/23.  She has had decreased appetite since then.  She has had multiple episodes of watery stool and endorses abdominal distention and frequent passage of gas.  Her last bowel movement was this morning and was watery.  She denies any vomiting, denies any nausea, denies any abdominal pain.  She states that her stool was tested outpatient.  He has lost her sense of taste as well.  She does smoke marijuana, last used on Monday.  She has been dry heaving but not actually vomiting.  Review of Systems  Positive: Diarrhea, abdominal distention Negative: Pain, shortness of breath, fever, abdominal pain  Physical Exam  Ht 5' 4 (1.626 m)   Wt 104.8 kg   BMI 39.65 kg/m  Gen:   Awake, no distress   Resp:  Normal effort  MSK:   Moves extremities without difficulty  Abd:  No abd TTP, Abd distended  Medical Decision Making  Medically screening exam initiated at 5:48 PM.  Appropriate orders placed.  ISSABELLA RIX was informed that the remainder of the evaluation will be completed by another provider, this initial triage assessment does not replace that evaluation, and the importance of remaining in the ED until their evaluation is complete.  Screening laboratory evaluation ordered, pt also endorsing lightheadedness on standing. Initial EKG shows sinus tachycardia rate 120. Will administer IVF bolus and room the patient when available.   Jerrol Agent, MD 08/28/23 [phone]

## 2023-08-28 NOTE — ED Notes (Signed)
 Extra tubes sent to lab:  Angie Carr

## 2023-08-28 NOTE — Progress Notes (Signed)
 PHARMACY - ANTICOAGULATION CONSULT NOTE  Pharmacy Consult for Heparin  Indication: PE  No Known Allergies  Patient Measurements: Height: 5' 4 (162.6 cm) Weight: 104.8 kg (231 lb) IBW/kg (Calculated) : 54.7 HEPARIN  DW (KG): 79.3  Vital Signs: Temp: 98.2 F (36.8 C) (08/27 2141) Temp Source: Oral (08/27 2141) BP: 113/64 (08/27 2000) Pulse Rate: 94 (08/27 2000)  Labs: Recent Labs    08/28/23 1731 08/28/23 2226  HGB 12.3  --   HCT 38.1  --   PLT 342  --   CREATININE 0.94  --   TROPONINIHS  --  8    Estimated Creatinine Clearance: 66.6 mL/min (by C-G formula based on SCr of 0.94 mg/dL).   Medical History: Past Medical History:  Diagnosis Date   Anemia    Anxiety    Asthma    pt denies   Diabetes mellitus without complication (HCC)    DJD (degenerative joint disease)    lower back   GERD (gastroesophageal reflux disease)    Headache    miagraines, none in years   Hypertension    LBP (low back pain)    Morbid obesity (HCC)    Neuromuscular disorder (HCC)    feet tingle and her balance is sometimes a problem   Primary osteoarthritis of both knees    Smoker     Medications:  (Not in a hospital admission)  Scheduled:  Infusions:   cefTRIAXone  (ROCEPHIN )  IV 1 g (08/28/23 2315)   PRN:   Assessment: 68 YO F with PMH significant for hip replacement 1 month ago presents to the ED with Nausea. Patient found to have Diffuse pulmonary arterial emboli on CT angio. Pharmacy consulted for Heparin  for VTE treatment.  Hgb 12.3, Hct 38.1, Plt 342  Goal of Therapy:  Heparin  level 0.3-0.7 units/ml Monitor platelets by anticoagulation protocol: Yes   Plan:  Give 6000 units bolus x 1 Start heparin  infusion at 1400 units/hr Check anti-Xa level in 6 hours and daily while on heparin  Continue to monitor H&H and platelets  R. Samual Satterfield, PharmD PGY-1 Acute Care Pharmacy Resident [facility] 08/28/2023 11:33 PM

## 2023-08-28 NOTE — ED Provider Notes (Signed)
 [facility] EMERGENCY DEPARTMENT AT [facility] Provider Note   CSN: [id number] Arrival date & time: 08/28/23  1724     Patient presents with: Nausea   Angie Carr is a 68 y.o. female.   HPI   68 year old female with medical history significant for hip replacement done 1 month ago who presents to the emergency department with multiple episodes of watery stool and diarrhea and decreased taste.  She states that she has been able to taste anything since her surgery.  She has been dry heaving and does smoke marijuana, last used on Monday.  She denies any fevers.  She her last bowel movement was this morning and was watery.  She has had persistent watery diarrhea since then.  She has also had mild shortness of breath.  She denies chest pain.  Prior to Admission medications   Medication Sig Start Date End Date Taking? Authorizing Provider  cilostazol  (PLETAL ) 50 MG tablet Take 50 mg by mouth 2 (two) times daily.   Yes [provider]  fesoterodine  (TOVIAZ ) 4 MG TB24 tablet Take 4 mg by mouth daily. 06/28/23  Yes [provider]  gabapentin  (NEURONTIN ) 400 MG capsule Take 400 mg by mouth See admin instructions. Take 1 capsule (400 mg) by mouth in the morning & take 2 capsules (800 mg) by mouth in the evening.   Yes [provider]  omeprazole  (PRILOSEC) 20 MG capsule TAKE 1 CAPSULE DAILY 07/21/13  Yes Joyce Norleen BROCKS, MD  Potassium Chloride  ER 20 MEQ TBCR Take 20 mEq by mouth in the morning. 06/28/23  Yes [provider]  pravastatin  (PRAVACHOL ) 40 MG tablet Take 40 mg by mouth daily in the afternoon.   Yes [provider]  amLODipine  (NORVASC ) 10 MG tablet TAKE 1 TABLET DAILY Patient not taking: Reported on 08/28/2023 05/01/13   Lalonde, John C, MD  diphenoxylate-atropine (LOMOTIL) 2.5-0.025 MG tablet Take 1 tablet by mouth 4 (four) times daily as needed for diarrhea or loose stools. Patient not taking: Reported on 08/28/2023 08/22/23    [provider]  GLYXAMBI 25-5 MG TABS Take 1 tablet by mouth in the morning. Patient not taking: Reported on 08/28/2023 11/08/14   [provider]  irbesartan-hydrochlorothiazide  (AVALIDE) 300-12.5 MG tablet Take 1 tablet by mouth in the morning. Patient not taking: Reported on 08/28/2023    [provider]  metFORMIN  (GLUCOPHAGE ) 1000 MG tablet Take 1 tablet (1,000 mg total) by mouth 2 (two) times daily with a meal. Patient not taking: Reported on 08/28/2023 12/16/14   Singh, Prashant K, MD  polyethylene glycol (MIRALAX ) 17 g packet Take 17 g by mouth daily. Patient not taking: Reported on 08/28/2023 07/24/23   Renae Bernarda CHRISTELLA, PA-C    Allergies: Patient has no known allergies.    Review of Systems  Respiratory:  Positive for shortness of breath.   All other systems reviewed and are negative.   Updated Vital Signs BP 113/64   Pulse 94   Temp 98.2 F (36.8 C) (Oral)   Resp 20   Ht 5' 4 (1.626 m)   Wt 104.8 kg   SpO2 98%   BMI 39.65 kg/m   Physical Exam Vitals and nursing note reviewed.  Constitutional:      General: She is not in acute distress.    Appearance: She is well-developed.  HENT:     Head: Normocephalic and atraumatic.  Eyes:     Conjunctiva/sclera: Conjunctivae normal.  Cardiovascular:     Rate and  Rhythm: Normal rate and regular rhythm.     Heart sounds: No murmur heard. Pulmonary:     Effort: Pulmonary effort is normal. No respiratory distress.     Breath sounds: Normal breath sounds.  Abdominal:     Palpations: Abdomen is soft.     Tenderness: There is abdominal tenderness.  Musculoskeletal:        General: No swelling.     Cervical back: Neck supple.  Skin:    General: Skin is warm and dry.     Capillary Refill: Capillary refill takes less than 2 seconds.  Neurological:     Mental Status: She is alert.  Psychiatric:        Mood and Affect: Mood normal.     (all labs ordered are listed, but only abnormal results are  displayed) Labs Reviewed  COMPREHENSIVE METABOLIC PANEL WITH GFR - Abnormal; Notable for the following components:      Result Value   CO2 12 (*)    Albumin 3.2 (*)    Total Bilirubin 1.3 (*)    Anion gap 23 (*)    All other components within normal limits  CBC - Abnormal; Notable for the following components:   RDW 17.0 (*)    All other components within normal limits  RAPID URINE DRUG SCREEN, HOSP PERFORMED - Abnormal; Notable for the following components:   Tetrahydrocannabinol POSITIVE (*)    All other components within normal limits  URINALYSIS, W/ REFLEX TO CULTURE (INFECTION SUSPECTED) - Abnormal; Notable for the following components:   Color, Urine AMBER (*)    APPearance CLOUDY (*)    Hgb urine dipstick MODERATE (*)    Bilirubin Urine MODERATE (*)    Ketones, ur 80 (*)    Protein, ur 100 (*)    Leukocytes,Ua MODERATE (*)    Bacteria, UA MANY (*)    All other components within normal limits  D-DIMER, QUANTITATIVE - Abnormal; Notable for the following components:   D-Dimer, Quant 5.16 (*)    All other components within normal limits  RESP PANEL BY RT-PCR (RSV, FLU A&B, COVID)  RVPGX2  GASTROINTESTINAL PANEL BY PCR, STOOL (REPLACES STOOL CULTURE)  C DIFFICILE QUICK SCREEN W PCR REFLEX    LIPASE, BLOOD  LACTIC ACID, PLASMA  BASIC METABOLIC PANEL WITH GFR  HEPARIN  LEVEL (UNFRACTIONATED)  CBC  TROPONIN I (HIGH SENSITIVITY)    EKG: EKG Interpretation Date/Time:  Wednesday August 28 2023 17:37:25 EDT Ventricular Rate:  120 PR Interval:  152 QRS Duration:  78 QT Interval:  312 QTC Calculation: 440 R Axis:   9  Text Interpretation: Sinus tachycardia with Premature ventricular complexes or Fusion complexes Low voltage QRS Cannot rule out Anterior infarct , age undetermined Marked ST abnormality, possible lateral subendocardial injury Abnormal ECG When compared with ECG of 19-Jul-2023 10:08, PREVIOUS ECG IS PRESENT Confirmed by Jerrol Agent ([id number]) on 08/28/2023 6:20:55  PM  Radiology: CT ABDOMEN PELVIS W CONTRAST Result Date: 08/28/2023 CLINICAL DATA:  Positive D-dimer and shortness of breath cough initial encounter EXAM: CT ANGIOGRAPHY CHEST CT ABDOMEN AND PELVIS WITH CONTRAST TECHNIQUE: Multidetector CT imaging of the chest was performed using the standard protocol during bolus administration of intravenous contrast. Multiplanar CT image reconstructions and MIPs were obtained to evaluate the vascular anatomy. Multidetector CT imaging of the abdomen and pelvis was performed using the standard protocol during bolus administration of intravenous contrast. RADIATION DOSE REDUCTION: This exam was performed according to the departmental dose-optimization program which includes automated exposure control, adjustment of  the mA and/or kV according to patient size and/or use of iterative reconstruction technique. CONTRAST:  75mL OMNIPAQUE  IOHEXOL  350 MG/ML SOLN COMPARISON:  None Available. FINDINGS: CTA CHEST FINDINGS Cardiovascular: Dilatation of the ascending aorta to 4.1 cm is noted. No dissection is noted. Coronary calcifications are noted. No cardiac enlargement is seen. The pulmonary artery shows a normal branching pattern bilaterally. Filling defects are noted within the segmental and subsegmental vessels on the right predominately within right middle and lower lobes. Mediastinum/Nodes: Thoracic inlet is within normal limits. No hilar or mediastinal adenopathy is noted. The esophagus as visualized is within normal limits. Lungs/Pleura: Diffuse emphysematous changes are identified bilaterally. Lungs are well aerated without focal infiltrate or effusion. No sizable parenchymal nodules are seen. Musculoskeletal: Degenerative changes of the thoracic spine are noted. No acute rib abnormality is seen. Review of the MIP images confirms the above findings. CT ABDOMEN and PELVIS FINDINGS Hepatobiliary: No focal liver abnormality is seen. Status post cholecystectomy. No biliary  dilatation. Pancreas: Unremarkable. No pancreatic ductal dilatation or surrounding inflammatory changes. Spleen: Normal in size without focal abnormality. Adrenals/Urinary Tract: Adrenal glands are within normal limits. Kidneys demonstrate no renal calculi or obstructive changes. Bladder is decompressed. Stomach/Bowel: Scattered diverticular change of the colon is noted without evidence of diverticulitis. Minimal partial herniation anteriorly is noted of portion of the mid transverse colon loop. The appendix is within normal limits. Small bowel and stomach are within normal limits. Vascular/Lymphatic: Aortic atherosclerosis. No enlarged abdominal or pelvic lymph nodes. Reproductive: Status post hysterectomy. No adnexal masses. Other: Ventral hernias are noted as described. One contains a portion of transverse colon without obstructive change. One contains fat. No abdominopelvic ascites. Musculoskeletal: Left hip prosthesis is noted. Degenerative changes of the right hip are noted. Lumbar spine degenerative changes are seen. No compression deformities are noted. Review of the MIP images confirms the above findings. IMPRESSION: CTA of the chest: Diffuse pulmonary arterial emboli within the right middle and right lower lobe branches. No definitive right heart strain is noted. CT of the abdomen and pelvis: Diverticulosis without diverticulitis. Ventral hernia containing a portion of a loop of transverse colon although no obstructive changes are noted. Additionally, a small fat containing right paraumbilical hernia is noted. Aortic Atherosclerosis (ICD10-I70.0) and Emphysema (ICD10-J43.9). Critical Value/emergent results were called by telephone at the time of interpretation on 08/28/2023 at 10:25 pm to Dr. LYNWOOD MOULDER , who verbally acknowledged these results. Electronically Signed   By: Oneil Devonshire M.D.   On: 08/28/2023 22:30   CT Angio Chest PE W and/or Wo Contrast Result Date: 08/28/2023 CLINICAL DATA:  Positive  D-dimer and shortness of breath cough initial encounter EXAM: CT ANGIOGRAPHY CHEST CT ABDOMEN AND PELVIS WITH CONTRAST TECHNIQUE: Multidetector CT imaging of the chest was performed using the standard protocol during bolus administration of intravenous contrast. Multiplanar CT image reconstructions and MIPs were obtained to evaluate the vascular anatomy. Multidetector CT imaging of the abdomen and pelvis was performed using the standard protocol during bolus administration of intravenous contrast. RADIATION DOSE REDUCTION: This exam was performed according to the departmental dose-optimization program which includes automated exposure control, adjustment of the mA and/or kV according to patient size and/or use of iterative reconstruction technique. CONTRAST:  75mL OMNIPAQUE  IOHEXOL  350 MG/ML SOLN COMPARISON:  None Available. FINDINGS: CTA CHEST FINDINGS Cardiovascular: Dilatation of the ascending aorta to 4.1 cm is noted. No dissection is noted. Coronary calcifications are noted. No cardiac enlargement is seen. The pulmonary artery shows a normal branching pattern bilaterally.  Filling defects are noted within the segmental and subsegmental vessels on the right predominately within right middle and lower lobes. Mediastinum/Nodes: Thoracic inlet is within normal limits. No hilar or mediastinal adenopathy is noted. The esophagus as visualized is within normal limits. Lungs/Pleura: Diffuse emphysematous changes are identified bilaterally. Lungs are well aerated without focal infiltrate or effusion. No sizable parenchymal nodules are seen. Musculoskeletal: Degenerative changes of the thoracic spine are noted. No acute rib abnormality is seen. Review of the MIP images confirms the above findings. CT ABDOMEN and PELVIS FINDINGS Hepatobiliary: No focal liver abnormality is seen. Status post cholecystectomy. No biliary dilatation. Pancreas: Unremarkable. No pancreatic ductal dilatation or surrounding inflammatory changes.  Spleen: Normal in size without focal abnormality. Adrenals/Urinary Tract: Adrenal glands are within normal limits. Kidneys demonstrate no renal calculi or obstructive changes. Bladder is decompressed. Stomach/Bowel: Scattered diverticular change of the colon is noted without evidence of diverticulitis. Minimal partial herniation anteriorly is noted of portion of the mid transverse colon loop. The appendix is within normal limits. Small bowel and stomach are within normal limits. Vascular/Lymphatic: Aortic atherosclerosis. No enlarged abdominal or pelvic lymph nodes. Reproductive: Status post hysterectomy. No adnexal masses. Other: Ventral hernias are noted as described. One contains a portion of transverse colon without obstructive change. One contains fat. No abdominopelvic ascites. Musculoskeletal: Left hip prosthesis is noted. Degenerative changes of the right hip are noted. Lumbar spine degenerative changes are seen. No compression deformities are noted. Review of the MIP images confirms the above findings. IMPRESSION: CTA of the chest: Diffuse pulmonary arterial emboli within the right middle and right lower lobe branches. No definitive right heart strain is noted. CT of the abdomen and pelvis: Diverticulosis without diverticulitis. Ventral hernia containing a portion of a loop of transverse colon although no obstructive changes are noted. Additionally, a small fat containing right paraumbilical hernia is noted. Aortic Atherosclerosis (ICD10-I70.0) and Emphysema (ICD10-J43.9). Critical Value/emergent results were called by telephone at the time of interpretation on 08/28/2023 at 10:25 pm to Dr. LYNWOOD MOULDER , who verbally acknowledged these results. Electronically Signed   By: Oneil Devonshire M.D.   On: 08/28/2023 22:30     Procedures   Medications Ordered in the ED  cefTRIAXone  (ROCEPHIN ) 1 g in sodium chloride  0.9 % 100 mL IVPB (1 g Intravenous New Bag/Given 08/28/23 2315)  heparin  bolus via infusion  6,000 Units (has no administration in time range)    Followed by  heparin  ADULT infusion 100 units/mL (25000 units/250mL) (has no administration in time range)  sodium chloride  0.9 % bolus 1,000 mL (1,000 mLs Intravenous New Bag/Given 08/28/23 2033)  ondansetron  (ZOFRAN ) injection 4 mg (4 mg Intravenous Given 08/28/23 2119)  sodium chloride  0.9 % bolus 1,000 mL (1,000 mLs Intravenous New Bag/Given 08/28/23 2119)  iohexol  (OMNIPAQUE ) 350 MG/ML injection 75 mL (75 mLs Intravenous Contrast Given 08/28/23 2203)    Clinical Course as of 08/28/23 2342  Wed Aug 28, 2023  2059 CO2(!): 12 [JL]  2059 Anion gap(!): 23 [JL]  2101 D-Dimer, Quant(!): 5.16 One month out post op [JL]  2214 Tetrahydrocannabinol(!): POSITIVE [JL]  2214 [address] Lager): MODERATE [JL]  2214 WBC, UA: 21-50 [JL]  2214 Bacteria, UA(!): MANY [JL]    Clinical Course User Index [JL] MOULDER LYNWOOD, MD                                 Medical Decision Making Amount and/or Complexity of Data Reviewed Labs: ordered.  Decision-making details documented in ED Course. Radiology: ordered.  Risk Prescription drug management. Decision regarding hospitalization.    68 year old female with medical history significant for hip replacement done 1 month ago who presents to the emergency department with multiple episodes of watery stool and diarrhea and decreased taste.  She states that she has been able to taste anything since her surgery.  She has been dry heaving and does smoke marijuana, last used on Monday.  She denies any fevers.  She her last bowel movement was this morning and was watery.  She has had persistent watery diarrhea since then.  She has also had mild shortness of breath.  She denies chest pain.  On arrival, the patient was afebrile, tachycardic heart rate 120, mildly tachypneic RR 22, BP 156/79, saturating 100% on room air.  Incentive diarrhea, laboratory workup initiated.  Had mild generalized abdominal tenderness on exam  in the setting of diarrhea.  Initial EKG revealed sinus tachycardia, PVCs present, nonspecific ST changes present.  Labs: Troponin normal, lactic acid normal, urinalysis with evidence of hematuria and UTI, D-dimer elevated to 5.16, lipase normal, CMP with a anion gap acidosis, bicarbonate 12, anion gap 23, CBC without a leukocytosis or anemia.  The setting of elevated D-dimer, mild shortness of breath, CTA PE study was obtained.  Additionally, CT of the abdomen pelvis was obtained in the setting of high anion gap acidosis and patient abdominal discomfort in the setting of diarrhea.   CTA PE: IMPRESSION: CTA of the chest: Diffuse pulmonary arterial emboli within the right middle and right lower lobe branches. No definitive right heart strain is noted.   CT of the abdomen and pelvis: Diverticulosis without diverticulitis.   Ventral hernia containing a portion of a loop of transverse colon although no obstructive changes are noted. Additionally, a small fat containing right paraumbilical hernia is noted.   Aortic Atherosclerosis (ICD10-I70.0) and Emphysema (ICD10-J43.9).   Critical Value/emergent results were called by telephone at the time of interpretation on 08/28/2023 at 10:25 pm to Dr. LYNWOOD MOULDER , who verbally acknowledged these results.  CT Abd Pel: IMPRESSION:  CTA of the chest: Diffuse pulmonary arterial emboli within the right  middle and right lower lobe branches. No definitive right heart  strain is noted.    CT of the abdomen and pelvis: Diverticulosis without diverticulitis.    Ventral hernia containing a portion of a loop of transverse colon  although no obstructive changes are noted. Additionally, a small fat  containing right paraumbilical hernia is noted.    Aortic Atherosclerosis (ICD10-I70.0) and Emphysema (ICD10-J43.9).    Critical Value/emergent results were called by telephone at the time  of interpretation on 08/28/2023 at 10:25 pm to Dr. LYNWOOD MOULDER  ,  who verbally acknowledged these results.   Patient found to have large PE burden on the right without evidence of right heart strain her BNP and troponin is normal.  Will start her on heparin .  CT of the abdomen pelvis did not show any evidence of diverticulitis, showed a ventral hernia containing a loop of transverse colon with no obstructive changes, small fat-containing periumbilical right sided hernia also noted.  C. difficile and GIP PCR testing ordered and pending, COVID flu and RSV PCR testing negative, UDS positive for THC consistent with the patient's known cannabis use.  Given the patient's UTI, she was administered IV Rocephin .  She was fluid resuscitated with 2 L of IV fluids and administered Zofran .  In the setting of the patient's high anion gap  acidosis, will plan to admit the patient for continued rehydration, suspect likely starvation ketosis due to nausea and decreased p.o. intake.  Patient was started on heparin  for PE burden, updated regarding results of diagnostic testing and plan for admission.  Patient subsequently admitted in stable condition, Dr. Franky accepting.      Final diagnoses:  Multiple subsegmental pulmonary emboli without acute cor pulmonale (HCC)  Diarrhea, unspecified type  Dehydration  High anion gap metabolic acidosis  Acute cystitis with hematuria    ED Discharge Orders     None          Jerrol Agent, MD 08/28/23 2342

## 2023-08-29 ENCOUNTER — Encounter (HOSPITAL_COMMUNITY): Payer: Self-pay | Admitting: Internal Medicine

## 2023-08-29 ENCOUNTER — Observation Stay (HOSPITAL_COMMUNITY)

## 2023-08-29 DIAGNOSIS — Z6839 Body mass index (BMI) 39.0-39.9, adult: Secondary | ICD-10-CM | POA: Diagnosis not present

## 2023-08-29 DIAGNOSIS — I2699 Other pulmonary embolism without acute cor pulmonale: Secondary | ICD-10-CM | POA: Diagnosis not present

## 2023-08-29 DIAGNOSIS — F1290 Cannabis use, unspecified, uncomplicated: Secondary | ICD-10-CM | POA: Diagnosis present

## 2023-08-29 DIAGNOSIS — Z23 Encounter for immunization: Secondary | ICD-10-CM | POA: Diagnosis not present

## 2023-08-29 DIAGNOSIS — Z96642 Presence of left artificial hip joint: Secondary | ICD-10-CM | POA: Diagnosis present

## 2023-08-29 DIAGNOSIS — R197 Diarrhea, unspecified: Secondary | ICD-10-CM | POA: Insufficient documentation

## 2023-08-29 DIAGNOSIS — I2694 Multiple subsegmental pulmonary emboli without acute cor pulmonale: Secondary | ICD-10-CM | POA: Diagnosis present

## 2023-08-29 DIAGNOSIS — Z823 Family history of stroke: Secondary | ICD-10-CM | POA: Diagnosis not present

## 2023-08-29 DIAGNOSIS — E860 Dehydration: Secondary | ICD-10-CM | POA: Diagnosis present

## 2023-08-29 DIAGNOSIS — E872 Acidosis, unspecified: Secondary | ICD-10-CM | POA: Diagnosis present

## 2023-08-29 DIAGNOSIS — I152 Hypertension secondary to endocrine disorders: Secondary | ICD-10-CM | POA: Diagnosis present

## 2023-08-29 DIAGNOSIS — Z1152 Encounter for screening for COVID-19: Secondary | ICD-10-CM | POA: Diagnosis not present

## 2023-08-29 DIAGNOSIS — E1151 Type 2 diabetes mellitus with diabetic peripheral angiopathy without gangrene: Secondary | ICD-10-CM | POA: Diagnosis present

## 2023-08-29 DIAGNOSIS — E1169 Type 2 diabetes mellitus with other specified complication: Secondary | ICD-10-CM

## 2023-08-29 DIAGNOSIS — Z8249 Family history of ischemic heart disease and other diseases of the circulatory system: Secondary | ICD-10-CM | POA: Diagnosis not present

## 2023-08-29 DIAGNOSIS — E785 Hyperlipidemia, unspecified: Secondary | ICD-10-CM | POA: Diagnosis present

## 2023-08-29 DIAGNOSIS — K429 Umbilical hernia without obstruction or gangrene: Secondary | ICD-10-CM | POA: Diagnosis present

## 2023-08-29 DIAGNOSIS — Z720 Tobacco use: Secondary | ICD-10-CM | POA: Diagnosis not present

## 2023-08-29 DIAGNOSIS — E1140 Type 2 diabetes mellitus with diabetic neuropathy, unspecified: Secondary | ICD-10-CM | POA: Diagnosis present

## 2023-08-29 DIAGNOSIS — M170 Bilateral primary osteoarthritis of knee: Secondary | ICD-10-CM | POA: Diagnosis present

## 2023-08-29 DIAGNOSIS — Z7902 Long term (current) use of antithrombotics/antiplatelets: Secondary | ICD-10-CM | POA: Diagnosis not present

## 2023-08-29 DIAGNOSIS — J45909 Unspecified asthma, uncomplicated: Secondary | ICD-10-CM | POA: Diagnosis present

## 2023-08-29 DIAGNOSIS — R55 Syncope and collapse: Secondary | ICD-10-CM | POA: Diagnosis not present

## 2023-08-29 DIAGNOSIS — K219 Gastro-esophageal reflux disease without esophagitis: Secondary | ICD-10-CM | POA: Diagnosis present

## 2023-08-29 DIAGNOSIS — I739 Peripheral vascular disease, unspecified: Secondary | ICD-10-CM | POA: Insufficient documentation

## 2023-08-29 DIAGNOSIS — Z90710 Acquired absence of both cervix and uterus: Secondary | ICD-10-CM | POA: Diagnosis not present

## 2023-08-29 DIAGNOSIS — Z79899 Other long term (current) drug therapy: Secondary | ICD-10-CM | POA: Diagnosis not present

## 2023-08-29 DIAGNOSIS — N3001 Acute cystitis with hematuria: Secondary | ICD-10-CM | POA: Diagnosis present

## 2023-08-29 LAB — GLUCOSE, CAPILLARY
Glucose-Capillary: 80 mg/dL (ref 70–99)
Glucose-Capillary: 93 mg/dL (ref 70–99)
Glucose-Capillary: 86 mg/dL (ref 70–99)
Glucose-Capillary: 77 mg/dL (ref 70–99)
Glucose-Capillary: 90 mg/dL (ref 70–99)

## 2023-08-29 LAB — BASIC METABOLIC PANEL WITH GFR
Anion gap: 16 — ABNORMAL HIGH (ref 5–15)
BUN: 9 mg/dL (ref 8–23)
CO2: 19 mmol/L — ABNORMAL LOW (ref 22–32)
Calcium: 7.9 mg/dL — ABNORMAL LOW (ref 8.9–10.3)
Chloride: 101 mmol/L (ref 98–111)
Creatinine, Ser: 0.85 mg/dL (ref 0.44–1.00)
GFR, Estimated: >60 mL/min (ref >60)
Glucose, Bld: 88 mg/dL (ref 70–99)
Potassium: 4.3 mmol/L (ref 3.5–5.1)
Sodium: 136 mmol/L (ref 135–145)

## 2023-08-29 LAB — CBC
HCT: 30.1 % — ABNORMAL LOW (ref 36.0–46.0)
Hemoglobin: 10 g/dL — ABNORMAL LOW (ref 12.0–15.0)
MCH: 30.6 pg (ref 26.0–34.0)
MCHC: 33.2 g/dL (ref 30.0–36.0)
MCV: 92 fL (ref 80.0–100.0)
Platelets: 308 K/uL (ref 150–400)
RBC: 3.27 MIL/uL — ABNORMAL LOW (ref 3.87–5.11)
RDW: 17.2 % — ABNORMAL HIGH (ref 11.5–15.5)
WBC: 5.9 K/uL (ref 4.0–10.5)
nRBC: 0 % (ref 0.0–0.2)

## 2023-08-29 LAB — HEPARIN LEVEL (UNFRACTIONATED)
Heparin Unfractionated: 1.06 [IU]/mL — ABNORMAL HIGH (ref 0.30–0.70)
Heparin Unfractionated: 0.68 [IU]/mL (ref 0.30–0.70)

## 2023-08-29 MED ORDER — GABAPENTIN 400 MG PO CAPS
400.0000 mg | ORAL_CAPSULE | Freq: Every morning | ORAL | Status: DC
Start: 2023-08-29 — End: 2023-08-30
  Administered 2023-08-29 – 2023-08-30 (×2): 400 mg via ORAL
  Filled 2023-08-29 (×2): qty 1

## 2023-08-29 MED ORDER — PANTOPRAZOLE SODIUM 40 MG PO TBEC
40.0000 mg | DELAYED_RELEASE_TABLET | Freq: Every day | ORAL | Status: DC
  Administered 2023-08-29 – 2023-08-30 (×2): 40 mg via ORAL
  Filled 2023-08-29 (×2): qty 1

## 2023-08-29 MED ORDER — PNEUMOCOCCAL 20-VAL CONJ VACC 0.5 ML IM SUSY
0.5000 mL | PREFILLED_SYRINGE | INTRAMUSCULAR | Status: AC
  Administered 2023-08-29: 0.5 mL via INTRAMUSCULAR
  Filled 2023-08-29: qty 0.5

## 2023-08-29 MED ORDER — ACETAMINOPHEN 325 MG PO TABS
650.0000 mg | ORAL_TABLET | Freq: Four times a day (QID) | ORAL | Status: DC | PRN
  Administered 2023-08-29: 650 mg via ORAL
  Filled 2023-08-29: qty 2

## 2023-08-29 MED ORDER — GABAPENTIN 400 MG PO CAPS: 400.0000 mg | ORAL_CAPSULE | ORAL | Status: DC

## 2023-08-29 MED ORDER — INSULIN ASPART 100 UNIT/ML IJ SOLN: 0.0000 [IU] | Freq: Three times a day (TID) | INTRAMUSCULAR | Status: DC

## 2023-08-29 MED ORDER — LACTATED RINGERS IV SOLN: INTRAVENOUS | Status: DC

## 2023-08-29 MED ORDER — MELATONIN 5 MG PO TABS
5.0000 mg | ORAL_TABLET | Freq: Every evening | ORAL | Status: DC | PRN
  Administered 2023-08-29: 5 mg via ORAL
  Filled 2023-08-29: qty 1

## 2023-08-29 MED ORDER — PRAVASTATIN SODIUM 40 MG PO TABS
40.0000 mg | ORAL_TABLET | Freq: Every day | ORAL | Status: DC
  Administered 2023-08-29: 40 mg via ORAL
  Filled 2023-08-29: qty 1

## 2023-08-29 MED ORDER — ACETAMINOPHEN 650 MG RE SUPP: 650.0000 mg | Freq: Four times a day (QID) | RECTAL | Status: DC | PRN

## 2023-08-29 MED ORDER — CILOSTAZOL 50 MG PO TABS
50.0000 mg | ORAL_TABLET | Freq: Two times a day (BID) | ORAL | Status: DC
  Administered 2023-08-29 – 2023-08-30 (×3): 50 mg via ORAL
  Filled 2023-08-29 (×4): qty 1

## 2023-08-29 MED ORDER — GABAPENTIN 400 MG PO CAPS
800.0000 mg | ORAL_CAPSULE | Freq: Every day | ORAL | Status: DC
  Administered 2023-08-29: 800 mg via ORAL
  Filled 2023-08-29: qty 2

## 2023-08-29 MED ORDER — FESOTERODINE FUMARATE ER 4 MG PO TB24
4.0000 mg | ORAL_TABLET | Freq: Every day | ORAL | Status: DC
  Administered 2023-08-29 – 2023-08-30 (×2): 4 mg via ORAL
  Filled 2023-08-29 (×2): qty 1

## 2023-08-29 MED ORDER — HEPARIN (PORCINE) 25000 UT/250ML-% IV SOLN
1100.0000 [IU]/h | INTRAVENOUS | Status: DC
  Administered 2023-08-29: 1200 [IU]/h via INTRAVENOUS

## 2023-08-29 MED ORDER — POTASSIUM CHLORIDE CRYS ER 20 MEQ PO TBCR
20.0000 meq | EXTENDED_RELEASE_TABLET | Freq: Every morning | ORAL | Status: DC
  Administered 2023-08-29 – 2023-08-30 (×2): 20 meq via ORAL
  Filled 2023-08-29 (×2): qty 1

## 2023-08-29 NOTE — Progress Notes (Signed)
 PHARMACY - ANTICOAGULATION CONSULT NOTE  Pharmacy Consult for heparin  Indication: pulmonary embolus  Labs: Recent Labs    08/28/23 1731 08/28/23 2226 08/29/23 0031 08/29/23 0416 08/29/23 0742 08/29/23 2225  HGB 12.3  --   --  10.0*  --   --   HCT 38.1  --   --  30.1*  --   --   PLT 342  --   --  308  --   --   HEPARINUNFRC  --   --   --   --  1.06* 0.68  CREATININE 0.94  --  0.85  --   --   --   TROPONINIHS  --  8  --   --   --   --     Assessment: 68yo female therapeutic on heparin  after rate change but remains on upper end of goal; no infusion issues or signs of bleeding per RN.  Goal of Therapy:  Heparin  level 0.3-0.7 units/ml   Plan:  Decrease heparin  infusion slightly to 1100 units/hr. Check level in 6 hours.   Marvetta Dauphin, PharmD, BCPS 08/29/2023 11:08 PM

## 2023-08-29 NOTE — Progress Notes (Signed)
  INTERVAL PROGRESS NOTE Same day as admission  Angie Carr- 68 y.o. female  LOS: 0 __________________________________________________________________  SUBJECTIVE: Admitted 08/28/2023 with cc of  Chief Complaint  Patient presents with   Nausea   Since admission, found to have acute PE. Started on heparin  infusion.  OBJECTIVE: Blood pressure 136/79, pulse 91, temperature 98.8 F (37.1 C), resp. rate 18, height 5' 4 (1.626 m), weight 104.8 kg, SpO2 97%.  General: NAD, pleasant, able to participate in exam Cardiac: RRR, normal heart sounds, no murmurs. 2+ radial and PT pulses bilaterally Respiratory: CTAB, normal effort, No wheezes, rales or rhonchi Abdomen: soft, nontender, nondistended, no hepatic or splenomegaly, +BS Extremities: no edema. WWP. Skin: warm and dry, no rashes noted Neuro: alert and oriented, no focal deficits Psych: Normal affect and mood   ASSESSMENT/PLAN:  I have reviewed the full H&P by Dr. Franky, and I reviewed pertinent findings. In addition:  Echo is pending. If not strain, will transition to PO anticoagulation.  Pending PT evaluation for dispo planning. Has not been very active since hip surgery likely leading to her PE.  Stable on room air.    Principal Problem:   Pulmonary embolism (HCC) Active Problems:   Diabetes mellitus, type 2 (HCC)   Morbid obesity (HCC)   Hypertension associated with diabetes (HCC)   Hyperlipidemia LDL goal <70   Peripheral arterial disease (HCC)   Diarrhea      Angie LITTIE Piety, DO [facility] 08/29/2023, 9:08 AM    [email or web address] Available by Epic secure chat 7AM-7PM. If 7PM-7AM, please contact night-coverage   No Charge

## 2023-08-29 NOTE — Progress Notes (Signed)
 Nurse Tech took temp and inserted the wrong value at the time of vitals. Pt is stable.

## 2023-08-29 NOTE — Progress Notes (Signed)
   08/29/23 1411  TOC Brief Assessment  Insurance and Status Reviewed  Patient has primary care physician Yes  Home environment has been reviewed son and grandson  Prior level of function: independent uses rollator  Prior/Current Home Services No current home services  Social Drivers of Health Review SDOH reviewed no interventions necessary  Readmission risk has been reviewed Yes  Transition of care needs no transition of care needs at this time     Transition of Care Department [facility]) has reviewed patient and no TOC needs have been identified at this time. We will continue to monitor patient advancement through interdisciplinary progression rounds. If new patient transition needs arise, please place a TOC consult.

## 2023-08-29 NOTE — Progress Notes (Signed)
 PHARMACY - ANTICOAGULATION CONSULT NOTE  Pharmacy Consult for Heparin  Indication: PE  No Known Allergies  Patient Measurements: Height: 5' 4 (162.6 cm) Weight: 104.8 kg (231 lb) IBW/kg (Calculated) : 54.7 HEPARIN  DW (KG): 79.3  Vital Signs: Temp: 98.8 F (37.1 C) (08/28 0845) Temp Source: Oral (08/28 0845) BP: 136/79 (08/28 0845) Pulse Rate: 91 (08/28 0845)  Labs: Recent Labs    08/28/23 1731 08/28/23 2226 08/29/23 0031 08/29/23 0416 08/29/23 0742  HGB 12.3  --   --  10.0*  --   HCT 38.1  --   --  30.1*  --   PLT 342  --   --  308  --   HEPARINUNFRC  --   --   --   --  1.06*  CREATININE 0.94  --  0.85  --   --   TROPONINIHS  --  8  --   --   --     Estimated Creatinine Clearance: 73.7 mL/min (by C-G formula based on SCr of 0.85 mg/dL).   Assessment: 67 YO F with PMH significant for hip replacement 1 month ago presents to the ED with Nausea. Patient found to have Diffuse pulmonary arterial emboli on CT angio. Pharmacy consulted for Heparin  for VTE treatment.   Heparin  level above goal 1.06 on current rate. No issues with infusion or signs of bleeding reported.  Goal of Therapy:  Heparin  level 0.3-0.7 units/ml Monitor platelets by anticoagulation protocol: Yes   Plan:  Hold heparin  infusion for 1 hour Restart heparin  infusion at 1200 units/hr Check heparin  level in 8 hours and daily while on heparin  Continue to monitor H&H and platelets  Thank you for allowing pharmacy to be a part of this patient's care.  Shelba Collier, PharmD, BCPS Clinical Pharmacist

## 2023-08-29 NOTE — Evaluation (Signed)
 Physical Therapy Evaluation Patient Details Name: Angie Carr MRN: [id number] DOB: 28-Feb-1955 Today's Date: 08/29/2023  History of Present Illness  Angie Carr is a 68 y.o. female who presented to [facility] ED 08/28/23 with SOB, poor appetite, and diarrhea. CT angiogram of the chest shows diffuse pulmonary embolism of the right middle and right lower lobe with no heart strain. EKG shows sinus tachycardia with concerning features for possible lateral ischemia. PMHx: T2DM, HTN, PAD, tobacco abuse, diabetic neuropathy, and L THA (07/24/23).   Clinical Impression  Pt admitted with above diagnosis. PTA, pt was modI for functional mobility using rollator and independent with ADLs/IADLs. She lives with her son and grandson in a one story house with a ramped entrance. Pt currently with functional limitations due to the deficits listed below (see PT Problem List). She performed bed mobility with supervision and required CGA for transfers and gait using RW. Pt ambulated ~75ft with a reciprocal gait pattern. She took one standing rest break. Cued PLB technique. She is currently limited by decreased activity tolerance. VSS throughout session. Pt will benefit from acute skilled PT to increase her independence and safety with mobility to allow discharge. Anticipate no follow-up PT needs at time of d/c.     If plan is discharge home, recommend the following: A little help with walking and/or transfers;A little help with bathing/dressing/bathroom;Assistance with cooking/housework;Assist for transportation;Help with stairs or ramp for entrance   Can travel by private vehicle        Equipment Recommendations None recommended by PT (Pt already has DME)  Recommendations for Other Services       Functional Status Assessment Patient has had a recent decline in their functional status and demonstrates the ability to make significant improvements in function in a reasonable and predictable amount of time.      Precautions / Restrictions Precautions Precautions: Fall Recall of Precautions/Restrictions: Intact Precaution/Restrictions Comments: No formal hip precautions per Op Note 07/24/23 Restrictions Weight Bearing Restrictions Per Provider Order: Yes LLE Weight Bearing Per Provider Order: Weight bearing as tolerated      Mobility  Bed Mobility Overal bed mobility: Needs Assistance Bed Mobility: Supine to Sit     Supine to sit: HOB elevated, Used rails, Supervision     General bed mobility comments: Pt sat up on R side of bed with increased time. HOB elevated ~30deg. Use of bed rail. Pt brought BLE off EOB and pushed up trunk. She took increased time to steady herself upon coming upright, d/t dizziness.    Transfers Overall transfer level: Needs assistance Equipment used: Rolling walker (2 wheels) Transfers: Sit to/from Stand, Bed to chair/wheelchair/BSC Sit to Stand: Contact guard assist   Step pivot transfers: Contact guard assist       General transfer comment: Pt stood from lowest bed height. Cued proper hand placement using RW. Powered up with light assist. Transferred to recliner chair on right. Good eccentric control.    Ambulation/Gait Ambulation/Gait assistance: Contact guard assist Gait Distance (Feet): 25 Feet Assistive device: Rolling walker (2 wheels) Gait Pattern/deviations: Step-through pattern, Decreased stride length, Trunk flexed Gait velocity: reduced Gait velocity interpretation: <1.31 ft/sec, indicative of household ambulator   General Gait Details: Pt ambulated with a reciprocal gait pattern, even weight shift, and adequate foot clearence. She maintained body inside AD at all times. Slight fwd lean, likely d/t use of rollator at baseline. No LOB.  Stairs            Psychologist, prison and probation services  Tilt Bed    Modified Rankin (Stroke Patients Only)       Balance Overall balance assessment: Mild deficits observed, not formally tested                                            Pertinent Vitals/Pain Pain Assessment Pain Assessment: No/denies pain    Home Living Family/patient expects to be discharged to:: Private residence Living Arrangements: Children;Other relatives (Son (73 y.o.) and Grandson (64 y.o.)) Available Help at Discharge: Family;Available PRN/intermittently;Available 24 hours/day (Grandson works; Son is home all the time wheelchair bound, so limited assistance) Type of Home: House Home Access: Ramped entrance       Home Layout: One level Home Equipment: Cane - single point;Rollator (4 wheels);Toilet riser      Prior Function Prior Level of Function : Independent/Modified Independent             Mobility Comments: Ambulates with using rollator. Denies fall history. ADLs Comments: Indep with ADLs. Manages her own medications. Pt relies on [facility] for transportation or family/friends.     Extremity/Trunk Assessment   Upper Extremity Assessment Upper Extremity Assessment: Overall WFL for tasks assessed;Right hand dominant    Lower Extremity Assessment Lower Extremity Assessment: Overall WFL for tasks assessed       Communication   Communication Communication: No apparent difficulties    Cognition Arousal: Alert Behavior During Therapy: WFL for tasks assessed/performed   PT - Cognitive impairments: No apparent impairments                       PT - Cognition Comments: Pt A,Ox4 Following commands: Intact       Cueing Cueing Techniques: Verbal cues     General Comments General comments (skin integrity, edema, etc.): VSS on RA. SpO2 >95% througout session. Pt reported SOB during gait. Cued PLB technique and facilitated standing rest break.    Exercises     Assessment/Plan    PT Assessment Patient needs continued PT services  PT Problem List Cardiopulmonary status limiting activity;Decreased activity tolerance;Decreased mobility;Decreased balance       PT  Treatment Interventions DME instruction;Gait training;Functional mobility training;Therapeutic activities;Therapeutic exercise;Balance training;Patient/family education    PT Goals (Current goals can be found in the Care Plan section)  Acute Rehab PT Goals Patient Stated Goal: Return Home PT Goal Formulation: With patient Time For Goal Achievement: 09/12/23 Potential to Achieve Goals: Good    Frequency Min 2X/week     Co-evaluation               AM-PAC PT 6 Clicks Mobility  Outcome Measure Help needed turning from your back to your side while in a flat bed without using bedrails?: A Little Help needed moving from lying on your back to sitting on the side of a flat bed without using bedrails?: A Little Help needed moving to and from a bed to a chair (including a wheelchair)?: A Little Help needed standing up from a chair using your arms (e.g., wheelchair or bedside chair)?: A Little Help needed to walk in hospital room?: A Little Help needed climbing 3-5 steps with a railing? : A Lot 6 Click Score: 17    End of Session Equipment Utilized During Treatment: Gait belt Activity Tolerance: Patient tolerated treatment well;Patient limited by fatigue Patient left: in chair;with call bell/phone within reach;with chair alarm set Nurse  Communication: Mobility status PT Visit Diagnosis: Other abnormalities of gait and mobility (R26.89)    Time: 8784-8754 PT Time Calculation (min) (ACUTE ONLY): 30 min   Charges:   PT Evaluation $PT Eval Moderate Complexity: 1 Mod PT Treatments $Gait Training: 8-22 mins PT General Charges $$ ACUTE PT VISIT: 1 Visit         Randall SAUNDERS, PT, DPT Acute Rehabilitation Services Office: [phone] Secure Chat Preferred  Angie Carr 08/29/2023, 1:47 PM

## 2023-08-29 NOTE — H&P (Addendum)
 History and Physical    DEVANSHI Carr FMW:[id number] DOB: [birth year] DOA: 08/28/2023  Patient coming from: Home.  Chief Complaint: Shortness of breath poor appetite and diarrhea.  HPI: Angie Carr is a 68 y.o. female with history of diabetes mellitus type 2, hypertension, peripheral artery disease, ongoing tobacco abuse, diabetic neuropathy who had left hip replacement on July 24, 2023 presents to the ER with complaints of shortness of breath poor appetite and diarrhea.  Patient had presented to her primary care physician with complaints of poor appetite ongoing for last 3 weeks and as per the patient during her visit with PCP her blood pressure was found to be low and also since she has been having poor appetite had recent hemoglobin A1c of 4.8 last month her PCP held her antihypertensives which includes amlodipine  ARB/hydrochlorothiazide  and also advised to hold off metformin  and Glyxambi.  Despite which she was still feeling weak poor appetite and started feeling short of breath over the last 1 week presents to the ER.  Patient states she has been ambulating since her surgery.  Denies any chest pain productive cough fever or chills.  Patient's diarrhea was watery once or twice a day for the last 3 weeks.  Denies any use of antibiotics recently.  No recent sick contacts or travel.  Had an episode of vomiting yesterday.  ED Course: In the ER CT angiogram of the chest shows diffuse pulmonary embolism of the right middle and right lower lobe with no heart strain.  EKG shows sinus tachycardia with concerning features for possible lateral ischemia.  Patient remained hemodynamically stable was started on heparin  infusion.  UA was abnormal but patient did not have any symptoms.  CT abdomen was unremarkable did show nonobstructing ventral and periumbilical hernia.  Labs did show anion gap metabolic acidosis was given 2 L fluid bolus.  Review of Systems: As per HPI, rest all negative.   Past  Medical History:  Diagnosis Date   Anemia    Anxiety    Asthma    pt denies   Diabetes mellitus without complication (HCC)    DJD (degenerative joint disease)    lower back   GERD (gastroesophageal reflux disease)    Headache    miagraines, none in years   Hypertension    LBP (low back pain)    Morbid obesity (HCC)    Neuromuscular disorder (HCC)    feet tingle and her balance is sometimes a problem   Primary osteoarthritis of both knees    Smoker     Past Surgical History:  Procedure Laterality Date   ABDOMINAL HYSTERECTOMY     complete   CHOLECYSTECTOMY     laparascopic   COLONOSCOPY N/A 02/11/2014   Procedure: COLONOSCOPY;  Surgeon: Lamar Donnald GAILS, MD;  Location: [facility];  Service: Endoscopy;  Laterality: N/A;   HERNIA REPAIR     ventral on left side.   TOTAL HIP ARTHROPLASTY Left 07/24/2023   Procedure: ARTHROPLASTY, HIP, TOTAL,POSTERIOR APPROACH;  Surgeon: Edna Toribio LABOR, MD;  Location: [facility];  Service: Orthopedics;  Laterality: Left;   TUBAL LIGATION       reports that she has been smoking cigarettes. She has never used smokeless tobacco. She reports current drug use. Drug: Marijuana. She reports that she does not drink alcohol .  No Known Allergies  Family History  Problem Relation Age of Onset   Hypertension Other    Stroke Other     Prior to Admission medications   Medication Sig  Start Date End Date Taking? Authorizing Provider  cilostazol  (PLETAL ) 50 MG tablet Take 50 mg by mouth 2 (two) times daily.   Yes [provider]  fesoterodine  (TOVIAZ ) 4 MG TB24 tablet Take 4 mg by mouth daily. 06/28/23  Yes [provider]  gabapentin  (NEURONTIN ) 400 MG capsule Take 400 mg by mouth See admin instructions. Take 1 capsule (400 mg) by mouth in the morning & take 2 capsules (800 mg) by mouth in the evening.   Yes [provider]  omeprazole  (PRILOSEC) 20 MG capsule TAKE 1 CAPSULE DAILY 07/21/13  Yes Lalonde, John C, MD  Potassium  Chloride ER 20 MEQ TBCR Take 20 mEq by mouth in the morning. 06/28/23  Yes [provider]  pravastatin  (PRAVACHOL ) 40 MG tablet Take 40 mg by mouth daily in the afternoon.   Yes [provider]  amLODipine  (NORVASC ) 10 MG tablet TAKE 1 TABLET DAILY Patient not taking: Reported on 08/28/2023 05/01/13   Angie Norleen BROCKS, MD  diphenoxylate-atropine (LOMOTIL) 2.5-0.025 MG tablet Take 1 tablet by mouth 4 (four) times daily as needed for diarrhea or loose stools. Patient not taking: Reported on 08/28/2023 08/22/23   [provider]  GLYXAMBI 25-5 MG TABS Take 1 tablet by mouth in the morning. Patient not taking: Reported on 08/28/2023 11/08/14   [provider]  irbesartan-hydrochlorothiazide  (AVALIDE) 300-12.5 MG tablet Take 1 tablet by mouth in the morning. Patient not taking: Reported on 08/28/2023    [provider]  metFORMIN  (GLUCOPHAGE ) 1000 MG tablet Take 1 tablet (1,000 mg total) by mouth 2 (two) times daily with a meal. Patient not taking: Reported on 08/28/2023 12/16/14   Singh, Prashant K, MD  polyethylene glycol (MIRALAX ) 17 g packet Take 17 g by mouth daily. Patient not taking: Reported on 08/28/2023 07/24/23   Renae Bernarda HERO, PA-C    Physical Exam: Constitutional: Moderately built and nourished. Vitals:   08/28/23 1749 08/28/23 2000 08/28/23 2141 08/29/23 0010  BP: (!) 156/79 113/64  134/78  Pulse: (!) 120 94  97  Resp: (!) 22 20  16   Temp: 98.9 F (37.2 C)  98.2 F (36.8 C) 97.6 F (36.4 C)  TempSrc:   Oral   SpO2: 100% 98%  99%  Weight:      Height:       Eyes: Anicteric no pallor. ENMT: No discharge from the ears eyes nose or mouth. Neck: No mass felt.  No neck rigidity. Respiratory: No rhonchi or crepitations. Cardiovascular: S1-S2 heard. Abdomen: Soft nontender bowel sound present. Musculoskeletal: No edema. Skin: No rash. Neurologic: Alert awake oriented to time place and person.  Moves all extremities. Psychiatric: Appears  normal.  Normal affect.   Labs on Admission: I have personally reviewed following labs and imaging studies  CBC: Recent Labs  Lab 08/28/23 1731  WBC 6.4  HGB 12.3  HCT 38.1  MCV 94.1  PLT 342   Basic Metabolic Panel: Recent Labs  Lab 08/28/23 1731 08/29/23 0031  NA 140 136  K 3.9 4.3  CL 105 101  CO2 12* 19*  GLUCOSE 92 88  BUN 10 9  CREATININE 0.94 0.85  CALCIUM 9.7 7.9*   GFR: Estimated Creatinine Clearance: 73.7 mL/min (by C-G formula based on SCr of 0.85 mg/dL). Liver Function Tests: Recent Labs  Lab 08/28/23 1731  AST 16  ALT 11  ALKPHOS 105  BILITOT 1.3*  PROT 7.5  ALBUMIN 3.2*   Recent Labs  Lab 08/28/23 1731  LIPASE 30  No results for input(s): AMMONIA in the last 168 hours. Coagulation Profile: No results for input(s): INR, PROTIME in the last 168 hours. Cardiac Enzymes: No results for input(s): CKTOTAL, CKMB, CKMBINDEX, TROPONINI in the last 168 hours. BNP (last 3 results) No results for input(s): PROBNP in the last 8760 hours. HbA1C: No results for input(s): HGBA1C in the last 72 hours. CBG: No results for input(s): GLUCAP in the last 168 hours. Lipid Profile: No results for input(s): CHOL, HDL, LDLCALC, TRIG, CHOLHDL, LDLDIRECT in the last 72 hours. Thyroid Function Tests: No results for input(s): TSH, T4TOTAL, FREET4, T3FREE, THYROIDAB in the last 72 hours. Anemia Panel: No results for input(s): VITAMINB12, FOLATE, FERRITIN, TIBC, IRON, RETICCTPCT in the last 72 hours. Urine analysis:    Component Value Date/Time   COLORURINE AMBER (A) 08/28/2023 2000   APPEARANCEUR CLOUDY (A) 08/28/2023 2000   LABSPEC 1.023 08/28/2023 2000   PHURINE 5.0 08/28/2023 2000   GLUCOSEU NEGATIVE 08/28/2023 2000   HGBUR MODERATE (A) 08/28/2023 2000   BILIRUBINUR MODERATE (A) 08/28/2023 2000   KETONESUR 80 (A) 08/28/2023 2000   PROTEINUR 100 (A) 08/28/2023 2000   NITRITE NEGATIVE 08/28/2023 2000    LEUKOCYTESUR MODERATE (A) 08/28/2023 2000   Sepsis Labs: @LABRCNTIP (procalcitonin:4,lacticidven:4) ) Recent Results (from the past 240 hours)  Resp panel by RT-PCR (RSV, Flu A&B, Covid) Anterior Nasal Swab     Status: None   Collection Time: 08/28/23  5:51 PM   Specimen: Anterior Nasal Swab  Result Value Ref Range Status   SARS Coronavirus 2 by RT PCR NEGATIVE NEGATIVE Final   Influenza A by PCR NEGATIVE NEGATIVE Final   Influenza B by PCR NEGATIVE NEGATIVE Final    Comment: (NOTE) The Xpert Xpress SARS-CoV-2/FLU/RSV plus assay is intended as an aid in the diagnosis of influenza from Nasopharyngeal swab specimens and should not be used as a sole basis for treatment. Nasal washings and aspirates are unacceptable for Xpert Xpress SARS-CoV-2/FLU/RSV testing.  Fact Sheet for Patients: BloggerCourse.com  Fact Sheet for Healthcare Providers: SeriousBroker.it  This test is not yet approved or cleared by the [place]  FDA and has been authorized for detection and/or diagnosis of SARS-CoV-2 by FDA under an Emergency Use Authorization (EUA). This EUA will remain in effect (meaning this test can be used) for the duration of the COVID-19 declaration under Section 564(b)(1) of the Act, 21 U.S.C. section 360bbb-3(b)(1), unless the authorization is terminated or revoked.     Resp Syncytial Virus by PCR NEGATIVE NEGATIVE Final    Comment: (NOTE) Fact Sheet for Patients: BloggerCourse.com  Fact Sheet for Healthcare Providers: SeriousBroker.it  This test is not yet approved or cleared by the [place]  FDA and has been authorized for detection and/or diagnosis of SARS-CoV-2 by FDA under an Emergency Use Authorization (EUA). This EUA will remain in effect (meaning this test can be used) for the duration of the COVID-19 declaration under Section 564(b)(1) of the Act, 21 U.S.C. section  360bbb-3(b)(1), unless the authorization is terminated or revoked.  Performed at [facility], [address] [place], [place] [address]      Radiological Exams on Admission: CT ABDOMEN PELVIS W CONTRAST Result Date: 08/28/2023 CLINICAL DATA:  Positive D-dimer and shortness of breath cough initial encounter EXAM: CT ANGIOGRAPHY CHEST CT ABDOMEN AND PELVIS WITH CONTRAST TECHNIQUE: Multidetector CT imaging of the chest was performed using the standard protocol during bolus administration of intravenous contrast. Multiplanar CT image reconstructions and MIPs were obtained to evaluate the vascular anatomy. Multidetector CT  imaging of the abdomen and pelvis was performed using the standard protocol during bolus administration of intravenous contrast. RADIATION DOSE REDUCTION: This exam was performed according to the departmental dose-optimization program which includes automated exposure control, adjustment of the mA and/or kV according to patient size and/or use of iterative reconstruction technique. CONTRAST:  75mL OMNIPAQUE  IOHEXOL  350 MG/ML SOLN COMPARISON:  None Available. FINDINGS: CTA CHEST FINDINGS Cardiovascular: Dilatation of the ascending aorta to 4.1 cm is noted. No dissection is noted. Coronary calcifications are noted. No cardiac enlargement is seen. The pulmonary artery shows a normal branching pattern bilaterally. Filling defects are noted within the segmental and subsegmental vessels on the right predominately within right middle and lower lobes. Mediastinum/Nodes: Thoracic inlet is within normal limits. No hilar or mediastinal adenopathy is noted. The esophagus as visualized is within normal limits. Lungs/Pleura: Diffuse emphysematous changes are identified bilaterally. Lungs are well aerated without focal infiltrate or effusion. No sizable parenchymal nodules are seen. Musculoskeletal: Degenerative changes of the thoracic spine are noted. No acute rib abnormality is seen. Review of the  MIP images confirms the above findings. CT ABDOMEN and PELVIS FINDINGS Hepatobiliary: No focal liver abnormality is seen. Status post cholecystectomy. No biliary dilatation. Pancreas: Unremarkable. No pancreatic ductal dilatation or surrounding inflammatory changes. Spleen: Normal in size without focal abnormality. Adrenals/Urinary Tract: Adrenal glands are within normal limits. Kidneys demonstrate no renal calculi or obstructive changes. Bladder is decompressed. Stomach/Bowel: Scattered diverticular change of the colon is noted without evidence of diverticulitis. Minimal partial herniation anteriorly is noted of portion of the mid transverse colon loop. The appendix is within normal limits. Small bowel and stomach are within normal limits. Vascular/Lymphatic: Aortic atherosclerosis. No enlarged abdominal or pelvic lymph nodes. Reproductive: Status post hysterectomy. No adnexal masses. Other: Ventral hernias are noted as described. One contains a portion of transverse colon without obstructive change. One contains fat. No abdominopelvic ascites. Musculoskeletal: Left hip prosthesis is noted. Degenerative changes of the right hip are noted. Lumbar spine degenerative changes are seen. No compression deformities are noted. Review of the MIP images confirms the above findings. IMPRESSION: CTA of the chest: Diffuse pulmonary arterial emboli within the right middle and right lower lobe branches. No definitive right heart strain is noted. CT of the abdomen and pelvis: Diverticulosis without diverticulitis. Ventral hernia containing a portion of a loop of transverse colon although no obstructive changes are noted. Additionally, a small fat containing right paraumbilical hernia is noted. Aortic Atherosclerosis (ICD10-I70.0) and Emphysema (ICD10-J43.9). Critical Value/emergent results were called by telephone at the time of interpretation on 08/28/2023 at 10:25 pm to Dr. LYNWOOD MOULDER , who verbally acknowledged these results.  Electronically Signed   By: Oneil Devonshire M.D.   On: 08/28/2023 22:30   CT Angio Chest PE W and/or Wo Contrast Result Date: 08/28/2023 CLINICAL DATA:  Positive D-dimer and shortness of breath cough initial encounter EXAM: CT ANGIOGRAPHY CHEST CT ABDOMEN AND PELVIS WITH CONTRAST TECHNIQUE: Multidetector CT imaging of the chest was performed using the standard protocol during bolus administration of intravenous contrast. Multiplanar CT image reconstructions and MIPs were obtained to evaluate the vascular anatomy. Multidetector CT imaging of the abdomen and pelvis was performed using the standard protocol during bolus administration of intravenous contrast. RADIATION DOSE REDUCTION: This exam was performed according to the departmental dose-optimization program which includes automated exposure control, adjustment of the mA and/or kV according to patient size and/or use of iterative reconstruction technique. CONTRAST:  75mL OMNIPAQUE  IOHEXOL  350 MG/ML SOLN COMPARISON:  None  Available. FINDINGS: CTA CHEST FINDINGS Cardiovascular: Dilatation of the ascending aorta to 4.1 cm is noted. No dissection is noted. Coronary calcifications are noted. No cardiac enlargement is seen. The pulmonary artery shows a normal branching pattern bilaterally. Filling defects are noted within the segmental and subsegmental vessels on the right predominately within right middle and lower lobes. Mediastinum/Nodes: Thoracic inlet is within normal limits. No hilar or mediastinal adenopathy is noted. The esophagus as visualized is within normal limits. Lungs/Pleura: Diffuse emphysematous changes are identified bilaterally. Lungs are well aerated without focal infiltrate or effusion. No sizable parenchymal nodules are seen. Musculoskeletal: Degenerative changes of the thoracic spine are noted. No acute rib abnormality is seen. Review of the MIP images confirms the above findings. CT ABDOMEN and PELVIS FINDINGS Hepatobiliary: No focal liver  abnormality is seen. Status post cholecystectomy. No biliary dilatation. Pancreas: Unremarkable. No pancreatic ductal dilatation or surrounding inflammatory changes. Spleen: Normal in size without focal abnormality. Adrenals/Urinary Tract: Adrenal glands are within normal limits. Kidneys demonstrate no renal calculi or obstructive changes. Bladder is decompressed. Stomach/Bowel: Scattered diverticular change of the colon is noted without evidence of diverticulitis. Minimal partial herniation anteriorly is noted of portion of the mid transverse colon loop. The appendix is within normal limits. Small bowel and stomach are within normal limits. Vascular/Lymphatic: Aortic atherosclerosis. No enlarged abdominal or pelvic lymph nodes. Reproductive: Status post hysterectomy. No adnexal masses. Other: Ventral hernias are noted as described. One contains a portion of transverse colon without obstructive change. One contains fat. No abdominopelvic ascites. Musculoskeletal: Left hip prosthesis is noted. Degenerative changes of the right hip are noted. Lumbar spine degenerative changes are seen. No compression deformities are noted. Review of the MIP images confirms the above findings. IMPRESSION: CTA of the chest: Diffuse pulmonary arterial emboli within the right middle and right lower lobe branches. No definitive right heart strain is noted. CT of the abdomen and pelvis: Diverticulosis without diverticulitis. Ventral hernia containing a portion of a loop of transverse colon although no obstructive changes are noted. Additionally, a small fat containing right paraumbilical hernia is noted. Aortic Atherosclerosis (ICD10-I70.0) and Emphysema (ICD10-J43.9). Critical Value/emergent results were called by telephone at the time of interpretation on 08/28/2023 at 10:25 pm to Dr. LYNWOOD MOULDER , who verbally acknowledged these results. Electronically Signed   By: Oneil Devonshire M.D.   On: 08/28/2023 22:30    EKG: Independently  reviewed.  Sinus tachycardia with nonspecific ST-T changes in the lateral leads.  Assessment/Plan Principal Problem:   Pulmonary embolism (HCC) Active Problems:   Diabetes mellitus, type 2 (HCC)   Morbid obesity (HCC)   Hypertension associated with diabetes (HCC)   Hyperlipidemia LDL goal <70   Peripheral arterial disease (HCC)   Diarrhea    Pulmonary embolism likely provoked with recent left hip surgery on July 24, 2023.  Presently hemodynamically stable but has been off antihypertensive recently due to low normal blood pressure.  Blood pressure is stable at this time.  Patient has been started on a heparin  infusion.  Check BNP troponin and 2D echo.  If patient remains hemodynamically stable may change to oral anticoagulants. Diarrhea with poor appetite anion gap metabolic acidosis check stool studies.  CT abdomen pelvis unremarkable.  She did have 1 episode of nausea vomiting.  Will closely monitor and advance diet.  Was given fluid bolus continue hydration follow metabolic panel. Hypertension ARB hydrochlorothiazide  and amlodipine  on hold by primary care physician about a week ago.  Follow blood pressure trends. Diabetes mellitus type 2  last hemoglobin A1c was 4.8 last month.  Patient used to be on Glyxambi and metformin  which were held by patient's primary care physician last week due to poor appetite.  Will keep patient on very sensitive sliding scale and closely monitor CBGs. Peripheral artery disease on statins and Pletal . Tobacco abuse advised about quitting. Abnormal UA no symptoms at this time.  Check urine cultures. Diabetic neuropathy on gabapentin .  Dose confirmed on PDMP website. Recent left hip surgery will consult physical therapy.  Since patient has pulmonary embolism will need close monitoring further workup and more than 2 midnight stay.   DVT prophylaxis: Heparin  infusion. Code Status: Full code. Family Communication: Discussed with patient. Disposition Plan: Medical  floor. Consults called: Physical therapy. Admission status: Observation.

## 2023-08-30 ENCOUNTER — Inpatient Hospital Stay (HOSPITAL_COMMUNITY)

## 2023-08-30 ENCOUNTER — Other Ambulatory Visit (HOSPITAL_COMMUNITY): Payer: Self-pay

## 2023-08-30 ENCOUNTER — Telehealth (HOSPITAL_COMMUNITY): Payer: Self-pay

## 2023-08-30 DIAGNOSIS — R55 Syncope and collapse: Secondary | ICD-10-CM

## 2023-08-30 DIAGNOSIS — I2699 Other pulmonary embolism without acute cor pulmonale: Secondary | ICD-10-CM | POA: Diagnosis not present

## 2023-08-30 LAB — CBC
HCT: 33.1 % — ABNORMAL LOW (ref 36.0–46.0)
Hemoglobin: 10.7 g/dL — ABNORMAL LOW (ref 12.0–15.0)
MCH: 30.4 pg (ref 26.0–34.0)
MCHC: 32.3 g/dL (ref 30.0–36.0)
MCV: 94 fL (ref 80.0–100.0)
Platelets: 291 K/uL (ref 150–400)
RBC: 3.52 MIL/uL — ABNORMAL LOW (ref 3.87–5.11)
RDW: 17.3 % — ABNORMAL HIGH (ref 11.5–15.5)
WBC: 5.6 K/uL (ref 4.0–10.5)
nRBC: 0 % (ref 0.0–0.2)

## 2023-08-30 LAB — GLUCOSE, CAPILLARY
Glucose-Capillary: 73 mg/dL (ref 70–99)
Glucose-Capillary: 60 mg/dL — ABNORMAL LOW (ref 70–99)
Glucose-Capillary: 84 mg/dL (ref 70–99)

## 2023-08-30 LAB — ECHOCARDIOGRAM COMPLETE
Area-P 1/2: 3.53 cm2
Height: 64 in
S' Lateral: 2 cm
Weight: 3696 [oz_av]

## 2023-08-30 MED ORDER — APIXABAN 5 MG PO TABS: 5.0000 mg | ORAL_TABLET | Freq: Two times a day (BID) | ORAL | Status: DC

## 2023-08-30 MED ORDER — APIXABAN 5 MG PO TABS
10.0000 mg | ORAL_TABLET | Freq: Two times a day (BID) | ORAL | Status: DC
  Administered 2023-08-30: 10 mg via ORAL
  Filled 2023-08-30: qty 2

## 2023-08-30 MED ORDER — ACIDOPHILUS PO CAPS
1.0000 | ORAL_CAPSULE | Freq: Three times a day (TID) | ORAL | 0 refills | Status: AC
  Filled 2023-08-30: qty 30, 10d supply, fill #0

## 2023-08-30 MED ORDER — PANTOPRAZOLE SODIUM 40 MG PO TBEC
40.0000 mg | DELAYED_RELEASE_TABLET | Freq: Every day | ORAL | 2 refills | Status: DC
  Filled 2023-08-30: qty 30, 30d supply, fill #0

## 2023-08-30 MED ORDER — APIXABAN 5 MG PO TABS
ORAL_TABLET | ORAL | 0 refills | Status: DC
  Filled 2023-08-30: qty 74, 30d supply, fill #0

## 2023-08-30 MED ORDER — FLORANEX PO PACK
1.0000 g | PACK | Freq: Three times a day (TID) | ORAL | Status: DC
  Administered 2023-08-30: 1 g via ORAL
  Filled 2023-08-30 (×3): qty 1

## 2023-08-30 MED ORDER — APIXABAN 5 MG PO TABS
ORAL_TABLET | ORAL | 0 refills | Status: DC
  Filled 2023-08-30: qty 192, 93d supply, fill #0

## 2023-08-30 MED ORDER — ACIDOPHILUS PO CAPS
ORAL_CAPSULE | Freq: Three times a day (TID) | ORAL | 0 refills | Status: DC
  Filled 2023-08-30: qty 30, 10d supply, fill #0

## 2023-08-30 MED ORDER — SULFAMETHOXAZOLE-TRIMETHOPRIM 800-160 MG PO TABS
1.0000 | ORAL_TABLET | Freq: Two times a day (BID) | ORAL | 0 refills | Status: AC
  Filled 2023-08-30: qty 6, 3d supply, fill #0

## 2023-08-30 NOTE — Hospital Course (Signed)
 Angie Carr is a 69 y.o. female with history of diabetes mellitus type 2, hypertension, peripheral artery disease, ongoing tobacco abuse, diabetic neuropathy who had left hip replacement on July 24, 2023 presents to the ER with complaints of shortness of breath poor appetite and diarrhea.  Patient had presented to her primary care physician with complaints of poor appetite ongoing for last 3 weeks and as per the patient during her visit with PCP her blood pressure was found to be low and also since she has been having poor appetite had recent hemoglobin A1c of 4.8 last month her PCP held her antihypertensives which includes amlodipine  ARB/hydrochlorothiazide  and also advised to hold off metformin  and Glyxambi.  Despite which she was still feeling weak poor appetite and started feeling short of breath over the last 1 week presents to the ER.  Patient states she has been ambulating since her surgery.  Denies any chest pain productive cough fever or chills.  Patient's diarrhea was watery once or twice a day for the last 3 weeks.  Denies any use of antibiotics recently.  No recent sick contacts or travel.  Had an episode of vomiting yesterday.   ED Course: In the ER CT angiogram of the chest shows diffuse pulmonary embolism of the right middle and right lower lobe with no heart strain.  EKG shows sinus tachycardia with concerning features for possible lateral ischemia.  Patient remained hemodynamically stable was started on heparin  infusion.  UA was abnormal but patient did not have any symptoms.  CT abdomen was unremarkable did show nonobstructing ventral and periumbilical hernia.  Labs did show anion gap metabolic acidosis was given 2 L fluid bolus.    Pulmonary embolism likely provoked with recent left hip surgery on July 24, 2023.  Presently hemodynamically stable but has been off antihypertensive recently due to low normal blood pressure.  Blood pressure is stable at this time.  Patient has been started  on a heparin  infusion.  Check BNP troponin and 2D echo.  If patient remains hemodynamically stable may change to oral anticoagulants.  Diarrhea with poor appetite anion gap metabolic acidosis check stool studies.  CT abdomen pelvis unremarkable.  She did have 1 episode of nausea vomiting.  Will closely monitor and advance diet.  Was given fluid bolus continue hydration follow metabolic panel.  Hypertension ARB hydrochlorothiazide  and amlodipine  on hold by primary care physician about a week ago.  Follow blood pressure trends.  Diabetes mellitus type 2 last hemoglobin A1c was 4.8 last month.  Patient used to be on Glyxambi and metformin  which were held by patient's primary care physician last week due to poor appetite.  Will keep patient on very sensitive sliding scale and closely monitor CBGs.  Peripheral artery disease on statins and Pletal .  Tobacco abuse advised about quitting.  Abnormal UA no symptoms at this time.  Check urine cultures.  Diabetic neuropathy on gabapentin .  Dose confirmed on PDMP website.  Recent left hip surgery will consult physical therapy.   Since

## 2023-08-30 NOTE — Progress Notes (Signed)
 Angie Carr to be D/C'd  per MD order.  Discussed with the patient and all questions fully answered.  VSS, Skin clean, dry and intact without evidence of skin break down, no evidence of skin tears noted.  IV catheter discontinued intact. Site without signs and symptoms of complications. Dressing and pressure applied.  An After Visit Summary was printed and given to the patient. Patient received prescription from [facility] pharmacy.  D/c education completed with patient/family including follow up instructions, medication list, d/c activities limitations if indicated, with other d/c instructions as indicated by MD - patient able to verbalize understanding, all questions fully answered.   Patient instructed to return to ED, call 911, or call MD for any changes in condition.   Patient was escorted via WC, and D/C home via private auto.

## 2023-08-30 NOTE — Discharge Instructions (Signed)
 Information on my medicine - ELIQUIS  (apixaban )  This medication education was reviewed with me or my healthcare representative as part of my discharge preparation.   Why was Eliquis  prescribed for you? Eliquis  was prescribed to treat blood clots that may have been found in the veins of your legs (deep vein thrombosis) or in your lungs (pulmonary embolism) and to reduce the risk of them occurring again.  What do You need to know about Eliquis  ? The starting dose is 10 mg (two 5 mg tablets) taken TWICE daily for the FIRST SEVEN (7) DAYS, then on  09/06/2023  the dose is reduced to ONE 5 mg tablet taken TWICE daily.  Eliquis  may be taken with or without food.   Try to take the dose about the same time in the morning and in the evening. If you have difficulty swallowing the tablet whole please discuss with your pharmacist how to take the medication safely.  Take Eliquis  exactly as prescribed and DO NOT stop taking Eliquis  without talking to the doctor who prescribed the medication.  Stopping may increase your risk of developing a new blood clot.  Refill your prescription before you run out.  After discharge, you should have regular check-up appointments with your healthcare provider that is prescribing your Eliquis .    What do you do if you miss a dose? If a dose of ELIQUIS  is not taken at the scheduled time, take it as soon as possible on the same day and twice-daily administration should be resumed. The dose should not be doubled to make up for a missed dose.  Important Safety Information A possible side effect of Eliquis  is bleeding. You should call your healthcare provider right away if you experience any of the following: Bleeding from an injury or your nose that does not stop. Unusual colored urine (red or dark brown) or unusual colored stools (red or black). Unusual bruising for unknown reasons. A serious fall or if you hit your head (even if there is no bleeding).  Some  medicines may interact with Eliquis  and might increase your risk of bleeding or clotting while on Eliquis . To help avoid this, consult your healthcare provider or pharmacist prior to using any new prescription or non-prescription medications, including herbals, vitamins, non-steroidal anti-inflammatory drugs (NSAIDs) and supplements.  This website has more information on Eliquis  (apixaban ): [email or web address]

## 2023-08-30 NOTE — Progress Notes (Signed)
  Echocardiogram 2D Echocardiogram has been performed.  Koleen KANDICE Popper, RDCS 08/30/2023, 9:36 AM

## 2023-08-30 NOTE — Progress Notes (Signed)
 PHARMACY - ANTICOAGULATION CONSULT NOTE  Pharmacy Consult for heparin  gtt >> apixaban  Indication: pulmonary embolus  No Known Allergies  Patient Measurements: Height: 5' 4 (162.6 cm) Weight: 104.8 kg (231 lb) IBW/kg (Calculated) : 54.7 HEPARIN  DW (KG): 79.3  Vital Signs: Temp: 98.2 F (36.8 C) (08/29 0606) Temp Source: Oral (08/29 0606) BP: 121/66 (08/29 0606) Pulse Rate: 81 (08/29 0606)  Labs: Recent Labs    08/28/23 1731 08/28/23 2226 08/29/23 0031 08/29/23 0416 08/29/23 0742 08/29/23 2225  HGB 12.3  --   --  10.0*  --   --   HCT 38.1  --   --  30.1*  --   --   PLT 342  --   --  308  --   --   HEPARINUNFRC  --   --   --   --  1.06* 0.68  CREATININE 0.94  --  0.85  --   --   --   TROPONINIHS  --  8  --   --   --   --     Estimated Creatinine Clearance: 73.7 mL/min (by C-G formula based on SCr of 0.85 mg/dL).   Medical History: Past Medical History:  Diagnosis Date   Anemia    Anxiety    Asthma    pt denies   Diabetes mellitus without complication (HCC)    DJD (degenerative joint disease)    lower back   GERD (gastroesophageal reflux disease)    Headache    miagraines, none in years   Hypertension    LBP (low back pain)    Morbid obesity (HCC)    Neuromuscular disorder (HCC)    feet tingle and her balance is sometimes a problem   Primary osteoarthritis of both knees    Smoker    Assessment: 68 YO F with PMH significant for hip replacement 1 month ago presents to the ED with Nausea. Patient found to have Diffuse pulmonary arterial emboli on CT angio. Pharmacy consulted for Heparin  to apixaban  for PE.  Goal of Therapy:  Monitor platelets by anticoagulation protocol: Yes   Plan:  -Stop heparin  gtt -Start apixaban  10mg  PO BID for 7 days (given been treated with heparin ), then 5mg  PO BID -Follow up cost and apixaban  education  Lynwood Poplar, PharmD, BCPS Clinical Pharmacist 08/30/2023 6:58 AM

## 2023-08-30 NOTE — Progress Notes (Signed)
 Pts blood sugar 60, started hypoglycemic protocol, MD notified via secure chat. Waiting to recheck in 15 minutes.  Repeat blood sugar 84

## 2023-08-30 NOTE — Telephone Encounter (Signed)
 Pharmacy Patient Advocate Encounter  Insurance verification completed.    The patient is insured through [facility]. Patient has Medicare and is not eligible for a copay card, but may be able to apply for patient assistance or Medicare RX Payment Plan (Patient Must reach out to their plan, if eligible for payment plan), if available.    Ran test claim for Eliquis and the current 30 day co-pay is $0.00.   This test claim was processed through [facility]- copay amounts may vary at other pharmacies due to pharmacy/plan contracts, or as the patient moves through the different stages of their insurance plan.

## 2023-08-30 NOTE — Discharge Summary (Signed)
 Physician Discharge Summary   Patient: Angie Carr MRN: [id number] DOB: 1955-12-19  Admit date:     08/28/2023  Discharge date: 08/30/23  Discharge Physician: Adriana DELENA Grams   PCP: Benjamine Aland, MD   Recommendations at discharge:  w-up, pending tests to follow-up on. (Optional):[id number]}  Follow-up with PCP in 1 week -Continue medication of Eliquis , increased risk of GI bleed, please follow instructions closely For developing a pulmonary embolism need to be anticoagulated for at least 3 months further recommendation by PCP after 3 months - Check your blood sugars before meals, please keep log follow-up with your PCP with modification of your diabetic medications -Monitor your blood pressure closely, may be checked few times a day keep a log report to PCP for modification of BP medications  Discharge Diagnoses: Principal Problem:   Pulmonary embolism (HCC) Active Problems:   Diabetes mellitus, type 2 (HCC)   Morbid obesity (HCC)   Hypertension associated with diabetes (HCC)   Hyperlipidemia LDL goal <70   Peripheral arterial disease (HCC)   Diarrhea   Pulmonary emboli (HCC)  Resolved Problems:   * No resolved hospital problems. *  Hospital Course: Angie Carr is a 68 y.o. female with history of diabetes mellitus type 2, hypertension, peripheral artery disease, ongoing tobacco abuse, diabetic neuropathy who had left hip replacement on July 24, 2023 presents to the ER with complaints of shortness of breath poor appetite and diarrhea.  Patient had presented to her primary care physician with complaints of poor appetite ongoing for last 3 weeks and as per the patient during her visit with PCP her blood pressure was found to be low and also since she has been having poor appetite had recent hemoglobin A1c of 4.8 last month her PCP held her antihypertensives which includes amlodipine  ARB/hydrochlorothiazide  and also advised to hold off metformin  and Glyxambi.  Despite which  she was still feeling weak poor appetite and started feeling short of breath over the last 1 week presents to the ER.  Patient states she has been ambulating since her surgery.  Denies any chest pain productive cough fever or chills.  Patient's diarrhea was watery once or twice a day for the last 3 weeks.  Denies any use of antibiotics recently.  No recent sick contacts or travel.  Had an episode of vomiting yesterday.   ED Course: In the ER CT angiogram of the chest shows diffuse pulmonary embolism of the right middle and right lower lobe with no heart strain.  EKG shows sinus tachycardia with concerning features for possible lateral ischemia.  Patient remained hemodynamically stable was started on heparin  infusion.  UA was abnormal but patient did not have any symptoms.  CT abdomen was unremarkable did show nonobstructing ventral and periumbilical hernia.  Labs did show anion gap metabolic acidosis was given 2 L fluid bolus.    Pulmonary embolism likely provoked with recent left hip surgery on July 24, 2023.  Presently hemodynamically stable but has been off antihypertensive recently due to low normal blood pressure.  Blood pressure is stable at this time.  Patient has been started on a heparin  infusion.  Troponin 8, lactic acid 1.8,  2D echo-reviewed in detail, EJF 65% to 70%.  negative for any right heart strain, mild LVH, grade 1 diastolic dysfunction, - Transition from heparin  drip to p.o. Eliquis  (recommending minimum of 3 months treatment for prolongation of treatment per evaluation and recommendation by PCP) Patient remained hemodynamically stable, satting 100% on room air  Diarrhea with  poor appetite anion gap metabolic acidosis  check stool studies negative, CT abdomen pelvis unremarkable.  She did have 1 episode of nausea vomiting.  That is post gentle IV fluid hydration  Hypertension -hypotensive, continue to hold ARB hydrochlorothiazide  and amlodipine  -follow-up with primary physician  within 1 week for reevaluation of blood pressure and resuming medication if needed     Diabetes mellitus type 2 last hemoglobin A1c was 4.8 last month.   Patient used to be on Glyxambi and metformin  which were held by patient's primary care physician last week due to poor appetite.   Commended patient to continue current modified diet, follow-up with PCP for resumption of current medications. Instructed patient to check CBG few times a day keep log and report to PCP   Peripheral artery disease on statins and Pletal .  Added Eliquis   Tobacco abuse advised about quitting.  Abnormal UA = remain asymptomatic, empiric antibiotic Bactrim  x 3 days Follow-up with cultures  Diabetic neuropathy on gabapentin .  Dose confirmed on PDMP website.  Recent left hip surgery will consult physical therapy.   Since   Procedures performed: echo   Disposition: Home Diet recommendation:  Discharge Diet Orders (From admission, onward)     Start     Ordered   08/30/23 0000  Diet - low sodium heart healthy        08/30/23 0703           Cardiac and Carb modified diet DISCHARGE MEDICATION: Allergies as of 08/30/2023   No Known Allergies      Medication List     PAUSE taking these medications    metFORMIN  1000 MG tablet Wait to take this until: September 04, 2023 Commonly known as: GLUCOPHAGE  Take 1 tablet (1,000 mg total) by mouth 2 (two) times daily with a meal.       STOP taking these medications    amLODipine  10 MG tablet Commonly known as: NORVASC    Glyxambi 25-5 MG Tabs Generic drug: Empagliflozin-linaGLIPtin   irbesartan-hydrochlorothiazide  300-12.5 MG tablet Commonly known as: AVALIDE   omeprazole  20 MG capsule Commonly known as: PRILOSEC Replaced by: pantoprazole  40 MG tablet   polyethylene glycol 17 g packet Commonly known as: MiraLax        TAKE these medications    Acidophilus Caps capsule Take 1 capsule by mouth 3 (three) times daily with meals for 10  days.   apixaban  5 MG Tabs tablet Commonly known as: ELIQUIS  Take 2 tablets (10 mg total) by mouth 2 (two) times daily for 7 days, THEN 1 tablet (5 mg total) 2 (two) times daily. Start taking on: August 30, 2023   cilostazol  50 MG tablet Commonly known as: PLETAL  Take 50 mg by mouth 2 (two) times daily.   diphenoxylate-atropine 2.5-0.025 MG tablet Commonly known as: LOMOTIL Take 1 tablet by mouth 4 (four) times daily as needed for diarrhea or loose stools.   fesoterodine  4 MG Tb24 tablet Commonly known as: TOVIAZ  Take 4 mg by mouth daily.   gabapentin  400 MG capsule Commonly known as: NEURONTIN  Take 400 mg by mouth See admin instructions. Take 1 capsule (400 mg) by mouth in the morning & take 2 capsules (800 mg) by mouth in the evening.   pantoprazole  40 MG tablet Commonly known as: PROTONIX  Take 1 tablet (40 mg total) by mouth daily. Replaces: omeprazole  20 MG capsule   Potassium Chloride  ER 20 MEQ Tbcr Take 20 mEq by mouth in the morning.   pravastatin  40 MG tablet Commonly known as: PRAVACHOL   Take 40 mg by mouth daily in the afternoon.   sulfamethoxazole -trimethoprim  800-160 MG tablet Commonly known as: Bactrim  DS Take 1 tablet by mouth 2 (two) times daily for 3 days.        Discharge Exam: Filed Weights   08/28/23 1727  Weight: 104.8 kg        General:  AAO x 3,  cooperative, no distress;   HEENT:  Normocephalic, PERRL, otherwise with in Normal limits   Neuro:  CNII-XII intact. , normal motor and sensation, reflexes intact   Lungs:   Clear to auscultation BL, Respirations unlabored,  No wheezes / crackles  Cardio:    S1/S2, RRR, No murmure, No Rubs or Gallops   Abdomen:  Soft, non-tender, bowel sounds active all four quadrants, no guarding or peritoneal signs.  Muscular  skeletal:  Limited exam -global generalized weaknesses - in bed, able to move all 4 extremities,   2+ pulses,  symmetric, No pitting edema  Skin:  Dry, warm to touch, negative for  any Rashes,  Wounds: Please see nursing documentation          Condition at discharge: fair  The results of significant diagnostics from this hospitalization (including imaging, microbiology, ancillary and laboratory) are listed below for reference.   Imaging Studies: CT ABDOMEN PELVIS W CONTRAST Result Date: 08/28/2023 CLINICAL DATA:  Positive D-dimer and shortness of breath cough initial encounter EXAM: CT ANGIOGRAPHY CHEST CT ABDOMEN AND PELVIS WITH CONTRAST TECHNIQUE: Multidetector CT imaging of the chest was performed using the standard protocol during bolus administration of intravenous contrast. Multiplanar CT image reconstructions and MIPs were obtained to evaluate the vascular anatomy. Multidetector CT imaging of the abdomen and pelvis was performed using the standard protocol during bolus administration of intravenous contrast. RADIATION DOSE REDUCTION: This exam was performed according to the departmental dose-optimization program which includes automated exposure control, adjustment of the mA and/or kV according to patient size and/or use of iterative reconstruction technique. CONTRAST:  75mL OMNIPAQUE  IOHEXOL  350 MG/ML SOLN COMPARISON:  None Available. FINDINGS: CTA CHEST FINDINGS Cardiovascular: Dilatation of the ascending aorta to 4.1 cm is noted. No dissection is noted. Coronary calcifications are noted. No cardiac enlargement is seen. The pulmonary artery shows a normal branching pattern bilaterally. Filling defects are noted within the segmental and subsegmental vessels on the right predominately within right middle and lower lobes. Mediastinum/Nodes: Thoracic inlet is within normal limits. No hilar or mediastinal adenopathy is noted. The esophagus as visualized is within normal limits. Lungs/Pleura: Diffuse emphysematous changes are identified bilaterally. Lungs are well aerated without focal infiltrate or effusion. No sizable parenchymal nodules are seen. Musculoskeletal:  Degenerative changes of the thoracic spine are noted. No acute rib abnormality is seen. Review of the MIP images confirms the above findings. CT ABDOMEN and PELVIS FINDINGS Hepatobiliary: No focal liver abnormality is seen. Status post cholecystectomy. No biliary dilatation. Pancreas: Unremarkable. No pancreatic ductal dilatation or surrounding inflammatory changes. Spleen: Normal in size without focal abnormality. Adrenals/Urinary Tract: Adrenal glands are within normal limits. Kidneys demonstrate no renal calculi or obstructive changes. Bladder is decompressed. Stomach/Bowel: Scattered diverticular change of the colon is noted without evidence of diverticulitis. Minimal partial herniation anteriorly is noted of portion of the mid transverse colon loop. The appendix is within normal limits. Small bowel and stomach are within normal limits. Vascular/Lymphatic: Aortic atherosclerosis. No enlarged abdominal or pelvic lymph nodes. Reproductive: Status post hysterectomy. No adnexal masses. Other: Ventral hernias are noted as described. One contains a portion of transverse colon  without obstructive change. One contains fat. No abdominopelvic ascites. Musculoskeletal: Left hip prosthesis is noted. Degenerative changes of the right hip are noted. Lumbar spine degenerative changes are seen. No compression deformities are noted. Review of the MIP images confirms the above findings. IMPRESSION: CTA of the chest: Diffuse pulmonary arterial emboli within the right middle and right lower lobe branches. No definitive right heart strain is noted. CT of the abdomen and pelvis: Diverticulosis without diverticulitis. Ventral hernia containing a portion of a loop of transverse colon although no obstructive changes are noted. Additionally, a small fat containing right paraumbilical hernia is noted. Aortic Atherosclerosis (ICD10-I70.0) and Emphysema (ICD10-J43.9). Critical Value/emergent results were called by telephone at the time of  interpretation on 08/28/2023 at 10:25 pm to Dr. LYNWOOD MOULDER , who verbally acknowledged these results. Electronically Signed   By: Oneil Devonshire M.D.   On: 08/28/2023 22:30   CT Angio Chest PE W and/or Wo Contrast Result Date: 08/28/2023 CLINICAL DATA:  Positive D-dimer and shortness of breath cough initial encounter EXAM: CT ANGIOGRAPHY CHEST CT ABDOMEN AND PELVIS WITH CONTRAST TECHNIQUE: Multidetector CT imaging of the chest was performed using the standard protocol during bolus administration of intravenous contrast. Multiplanar CT image reconstructions and MIPs were obtained to evaluate the vascular anatomy. Multidetector CT imaging of the abdomen and pelvis was performed using the standard protocol during bolus administration of intravenous contrast. RADIATION DOSE REDUCTION: This exam was performed according to the departmental dose-optimization program which includes automated exposure control, adjustment of the mA and/or kV according to patient size and/or use of iterative reconstruction technique. CONTRAST:  75mL OMNIPAQUE  IOHEXOL  350 MG/ML SOLN COMPARISON:  None Available. FINDINGS: CTA CHEST FINDINGS Cardiovascular: Dilatation of the ascending aorta to 4.1 cm is noted. No dissection is noted. Coronary calcifications are noted. No cardiac enlargement is seen. The pulmonary artery shows a normal branching pattern bilaterally. Filling defects are noted within the segmental and subsegmental vessels on the right predominately within right middle and lower lobes. Mediastinum/Nodes: Thoracic inlet is within normal limits. No hilar or mediastinal adenopathy is noted. The esophagus as visualized is within normal limits. Lungs/Pleura: Diffuse emphysematous changes are identified bilaterally. Lungs are well aerated without focal infiltrate or effusion. No sizable parenchymal nodules are seen. Musculoskeletal: Degenerative changes of the thoracic spine are noted. No acute rib abnormality is seen. Review of the MIP  images confirms the above findings. CT ABDOMEN and PELVIS FINDINGS Hepatobiliary: No focal liver abnormality is seen. Status post cholecystectomy. No biliary dilatation. Pancreas: Unremarkable. No pancreatic ductal dilatation or surrounding inflammatory changes. Spleen: Normal in size without focal abnormality. Adrenals/Urinary Tract: Adrenal glands are within normal limits. Kidneys demonstrate no renal calculi or obstructive changes. Bladder is decompressed. Stomach/Bowel: Scattered diverticular change of the colon is noted without evidence of diverticulitis. Minimal partial herniation anteriorly is noted of portion of the mid transverse colon loop. The appendix is within normal limits. Small bowel and stomach are within normal limits. Vascular/Lymphatic: Aortic atherosclerosis. No enlarged abdominal or pelvic lymph nodes. Reproductive: Status post hysterectomy. No adnexal masses. Other: Ventral hernias are noted as described. One contains a portion of transverse colon without obstructive change. One contains fat. No abdominopelvic ascites. Musculoskeletal: Left hip prosthesis is noted. Degenerative changes of the right hip are noted. Lumbar spine degenerative changes are seen. No compression deformities are noted. Review of the MIP images confirms the above findings. IMPRESSION: CTA of the chest: Diffuse pulmonary arterial emboli within the right middle and right lower lobe branches. No definitive  right heart strain is noted. CT of the abdomen and pelvis: Diverticulosis without diverticulitis. Ventral hernia containing a portion of a loop of transverse colon although no obstructive changes are noted. Additionally, a small fat containing right paraumbilical hernia is noted. Aortic Atherosclerosis (ICD10-I70.0) and Emphysema (ICD10-J43.9). Critical Value/emergent results were called by telephone at the time of interpretation on 08/28/2023 at 10:25 pm to Dr. LYNWOOD MOULDER , who verbally acknowledged these results.  Electronically Signed   By: Oneil Devonshire M.D.   On: 08/28/2023 22:30    Microbiology: Results for orders placed or performed during the hospital encounter of 08/28/23  Resp panel by RT-PCR (RSV, Flu A&B, Covid) Anterior Nasal Swab     Status: None   Collection Time: 08/28/23  5:51 PM   Specimen: Anterior Nasal Swab  Result Value Ref Range Status   SARS Coronavirus 2 by RT PCR NEGATIVE NEGATIVE Final   Influenza A by PCR NEGATIVE NEGATIVE Final   Influenza B by PCR NEGATIVE NEGATIVE Final    Comment: (NOTE) The Xpert Xpress SARS-CoV-2/FLU/RSV plus assay is intended as an aid in the diagnosis of influenza from Nasopharyngeal swab specimens and should not be used as a sole basis for treatment. Nasal washings and aspirates are unacceptable for Xpert Xpress SARS-CoV-2/FLU/RSV testing.  Fact Sheet for Patients: BloggerCourse.com  Fact Sheet for Healthcare Providers: SeriousBroker.it  This test is not yet approved or cleared by the [place]  FDA and has been authorized for detection and/or diagnosis of SARS-CoV-2 by FDA under an Emergency Use Authorization (EUA). This EUA will remain in effect (meaning this test can be used) for the duration of the COVID-19 declaration under Section 564(b)(1) of the Act, 21 U.S.C. section 360bbb-3(b)(1), unless the authorization is terminated or revoked.     Resp Syncytial Virus by PCR NEGATIVE NEGATIVE Final    Comment: (NOTE) Fact Sheet for Patients: BloggerCourse.com  Fact Sheet for Healthcare Providers: SeriousBroker.it  This test is not yet approved or cleared by the [place]  FDA and has been authorized for detection and/or diagnosis of SARS-CoV-2 by FDA under an Emergency Use Authorization (EUA). This EUA will remain in effect (meaning this test can be used) for the duration of the COVID-19 declaration under Section 564(b)(1) of the  Act, 21 U.S.C. section 360bbb-3(b)(1), unless the authorization is terminated or revoked.  Performed at [facility], [address] [place], [place] [address]     Labs: CBC: Recent Labs  Lab 08/28/23 1731 08/29/23 0416 08/30/23 1024  WBC 6.4 5.9 5.6  HGB 12.3 10.0* 10.7*  HCT 38.1 30.1* 33.1*  MCV 94.1 92.0 94.0  PLT 342 308 291   Basic Metabolic Panel: Recent Labs  Lab 08/28/23 1731 08/29/23 0031  NA 140 136  K 3.9 4.3  CL 105 101  CO2 12* 19*  GLUCOSE 92 88  BUN 10 9  CREATININE 0.94 0.85  CALCIUM 9.7 7.9*   Liver Function Tests: Recent Labs  Lab 08/28/23 1731  AST 16  ALT 11  ALKPHOS 105  BILITOT 1.3*  PROT 7.5  ALBUMIN 3.2*   CBG: Recent Labs  Lab 08/29/23 1159 08/29/23 1612 08/29/23 1811 08/29/23 2056 08/30/23 0748  GLUCAP 93 86 77 90 73    Discharge time spent: greater than 40 minutes.  Signed: Adriana DELENA Grams, MD [facility] 08/30/2023

## 2023-08-30 NOTE — Progress Notes (Signed)
 Hypoglycemic Event  CBG: 60  Treatment: 8 oz juice/soda  Symptoms: None  Follow-up CBG: Time:1215 CBG Result:84  Possible Reasons for Event: Inadequate meal intake  Comments/MD notified: Dr. Willette was notified via secure chat.    Angie Carr

## 2023-08-30 NOTE — Progress Notes (Signed)
 Med Req completed and AVS printed;placed with chart.  Discharge delayed due to patient reported dizziness to primary nurse Tamika,LPN and MD notified.  Patient had Echo completed; waiting results. Patient unsure of transportation home at this time.

## 2023-09-03 ENCOUNTER — Telehealth: Payer: Self-pay

## 2023-09-03 NOTE — Transitions of Care (Post Inpatient/ED Visit) (Signed)
 09/03/2023  Name: Angie Carr MRN: [id number] DOB: 12-28-1955  Today's TOC FU Call Status: Today's TOC FU Call Status:: Successful TOC FU Call Completed TOC FU Call Complete Date: 09/03/23 Patient's Name and Date of Birth confirmed.  Transition Care Management Follow-up Telephone Call Date of Discharge: 08/30/23 Discharge Facility: [facility]) Type of Discharge: Inpatient Admission Primary Inpatient Discharge Diagnosis:: Pulmonary Emboli; Diabetes; How have you been since you were released from the hospital?: Better (But still no appetite for 4 or more weeks since hip surgery) Any questions or concerns?: Yes Patient Questions/Concerns:: Just trying to figure out about my appetite  Items Reviewed: Did you receive and understand the discharge instructions provided?: Yes Medications obtained,verified, and reconciled?: Yes (Medications Reviewed) Any new allergies since your discharge?: No Dietary orders reviewed?: Yes Type of Diet Ordered:: Heart Healthy - patient not able to eat much food - no appetite, some nausea and vomiting Do you have support at home?: Yes People in Home [RPT]: grandchild(ren), sibling(s) Name of Support/Comfort Primary Source: sister is contact person - Ronal Spurling  Medications Reviewed Today: Medications Reviewed Today     Reviewed by Eilleen Richerd GRADE, RN (Registered Nurse) on 09/03/23 at 1015  Med List Status: <None>   Medication Order Taking? Sig Documenting Provider Last Dose Status Informant  apixaban  (ELIQUIS ) 5 MG TABS tablet [id number] Yes Take 2 tablets (10 mg total) by mouth 2 (two) times daily for 7 days, THEN 1 tablet (5 mg total) 2 (two) times daily. Willette Adriana LABOR, MD  Active   cilostazol  (PLETAL ) 50 MG tablet [id number] Yes Take 50 mg by mouth 2 (two) times daily. [provider]  Active Self, Pharmacy Records           Med Note (WHITE, TONIA S   Wed Aug 28, 2023 11:15 PM) Took yesterday, but immediately vomited.   diphenoxylate-atropine (LOMOTIL) 2.5-0.025 MG tablet [id number]  Take 1 tablet by mouth 4 (four) times daily as needed for diarrhea or loose stools.  Patient not taking: Reported on 09/03/2023   [provider]  Active Self, Pharmacy Records           Med Note (WHITE, DONETA RAMAN   Wed Aug 28, 2023 11:30 PM) Called into the pharmacy, but she didn't know.  fesoterodine  (TOVIAZ ) 4 MG TB24 tablet [id number] Yes Take 4 mg by mouth daily. [provider]  Active Self, Pharmacy Records           Med Note (WHITE, TONIA S   Wed Aug 28, 2023 11:15 PM) Took yesterday, but immediately vomited.   gabapentin  (NEURONTIN ) 400 MG capsule [id number] Yes Take 400 mg by mouth See admin instructions. Take 1 capsule (400 mg) by mouth in the morning & take 2 capsules (800 mg) by mouth in the evening. [provider]  Active Self, Pharmacy Records           Med Note (WHITE, TONIA S   Wed Aug 28, 2023 11:15 PM) Took yesterday, but immediately vomited.   Lactobacillus (ACIDOPHILUS) CAPS capsule [id number] Yes Take 1 capsule by mouth 3 (three) times daily with meals for 10 days. Willette Adriana LABOR, MD  Active   metFORMIN  (GLUCOPHAGE ) 1000 MG tablet [id number]  Take 1 tablet (1,000 mg total) by mouth 2 (two) times daily with a meal.  Patient not taking: Reported on 08/28/2023   Singh, Prashant K, MD  Active Self, Pharmacy Records           Med Note [facility] [place]  S   Wed Aug 28, 2023 11:16 PM) On hold per provider  pantoprazole  (PROTONIX ) 40 MG tablet [id number] Yes Take 1 tablet (40 mg total) by mouth daily. Willette Adriana LABOR, MD  Active   Potassium Chloride  ER 20 MEQ TBCR [id number] Yes Take 20 mEq by mouth in the morning. [provider]  Active Self, Pharmacy Records           Med Note (WHITE, DONETA RAMAN   Wed Aug 28, 2023 11:17 PM) Took yesterday, but immediately vomited.   pravastatin  (PRAVACHOL ) 40 MG tablet [id number] Yes Take 40 mg by mouth daily in the afternoon. [provider]   Active Self, Pharmacy Records            Home Care and Equipment/Supplies: Were Home Health Services Ordered?: No Any new equipment or medical supplies ordered?: No  Functional Questionnaire: Do you need assistance with bathing/showering or dressing?: No Do you need assistance with meal preparation?: No Do you need assistance with eating?: No Do you have difficulty maintaining continence: No Do you need assistance with getting out of bed/getting out of a chair/moving?: No Do you have difficulty managing or taking your medications?: No  Follow up appointments reviewed: PCP Follow-up appointment confirmed?: Yes Date of PCP follow-up appointment?: 09/04/23 Follow-up Provider: Dr. Kennieth Leech Specialist [facility] Follow-up appointment confirmed?: Yes Date of Specialist follow-up appointment?: 09/05/23 Follow-Up Specialty Provider:: [facility] - with [facility] Do you need transportation to your follow-up appointment?: No Do you understand care options if your condition(s) worsen?: Yes-patient verbalized understanding  SDOH Interventions Today    Flowsheet Row Most Recent Value  SDOH Interventions   Food Insecurity Interventions Intervention Not Indicated  Housing Interventions Intervention Not Indicated  Transportation Interventions Intervention Not Indicated  [Uses SCAT]  Utilities Interventions Intervention Not Indicated     Goals Addressed             This Visit's Progress    VBCI Transitions of Care (TOC) Care Plan       Problems:  Recent Hospitalization for treatment of Pulmonary Disease Knowledge Deficit Related to Medications and diet - patient to start Metformin  09/04/23 - was not sure when reviewed AVS with patient  Goal:  Over the next 30 days, the patient will not experience hospital readmission  Interventions:  Transitions of Care: Health Screening reviewed with patient/caregiver, orders reviewed, and education provided Doctor Visits  - discussed  the importance of doctor visits  Patient Self Care Activities:  Attend all scheduled provider appointments Call pharmacy for medication refills 3-7 days in advance of running out of medications Call provider office for new concerns or questions  Notify RN Care Manager of [facility] call rescheduling needs Participate in Transition of Care Program/Attend [facility] scheduled calls Take medications as prescribed   Reviewed AVS with patient today  Plan:  Follow up with provider re: post hospital follow up and loss of appetite with appointment 09/04/23 with Dr. Leech Telephone follow up appointment with care management team member scheduled for:  09/10/23 at 11:00 am The care management team will reach out to the patient again over the next 5-10 business days. The patient has been provided with contact information for the care management team and has been advised to call with any health related questions or concerns.         Richerd Fish, RN, BSN, CCM [facility], [facility] RN Care Manager Direct Dial: [phone]

## 2023-09-03 NOTE — Patient Instructions (Addendum)
 Visit Information  Thank you for taking time to visit with me today. Please don't hesitate to contact me if I can be of assistance to you before our next scheduled telephone appointment.  Our next appointment is by telephone on 09/10/23 at 11:00  Following is a copy of your care plan:   Goals Addressed             This Visit's Progress    VBCI Transitions of Care (TOC) Care Plan       Problems:  Recent Hospitalization for treatment of Pulmonary Disease Knowledge Deficit Related to Medications and diet - patient to start Metformin  09/04/23 - was not sure when reviewed AVS with patient  Goal:  Over the next 30 days, the patient will not experience hospital readmission  Interventions:  Transitions of Care: Health Screening reviewed with patient/caregiver, orders reviewed, and education provided Doctor Visits  - discussed the importance of doctor visits  Patient Self Care Activities:  Attend all scheduled provider appointments Call pharmacy for medication refills 3-7 days in advance of running out of medications Call provider office for new concerns or questions  Notify RN Care Manager of [facility] call rescheduling needs Participate in Transition of Care Program/Attend TOC scheduled calls Take medications as prescribed   Reviewed AVS with patient today  Plan:  Follow up with provider re: post hospital follow up and loss of appetite with appointment 09/04/23 with Dr. Benjamine Telephone follow up appointment with care management team member scheduled for:  09/10/23 at 11:00 am The care management team will reach out to the patient again over the next 5-10 business days. The patient has been provided with contact information for the care management team and has been advised to call with any health related questions or concerns.         The patient verbalized understanding of instructions, educational materials, and care plan provided today and DECLINED offer to receive copy of patient  instructions, educational materials, and care plan.  Patient is going to see if grandson or nephew will help her sign up for MyChart. Used teach back method and verbalized understanding information given and wrote down next appointment. Has AVS to take to PCP appointment.  Telephone follow up appointment with care management team member scheduled for: The patient has been provided with contact information for the care management team and has been advised to call with any health related questions or concerns.  Follow up with provider re: 09/04/23 with Dr. Kennieth Benjamine  Please call the care guide team at [phone] if you need to cancel or reschedule your appointment.   Please call the [place]  National Suicide Prevention Lifeline: 951-877-1165 or TTY: 224-729-7575 TTY (414) 330-2632) to talk to a trained counselor if you are experiencing a Mental Health or Behavioral Health Crisis or need someone to talk to.  Richerd Fish, RN, BSN, CCM [facility], [facility] RN Care Manager Direct Dial: [phone]

## 2023-09-04 DIAGNOSIS — R634 Abnormal weight loss: Secondary | ICD-10-CM | POA: Diagnosis not present

## 2023-09-04 DIAGNOSIS — I10 Essential (primary) hypertension: Secondary | ICD-10-CM | POA: Diagnosis not present

## 2023-09-04 DIAGNOSIS — R000 Tachycardia, unspecified: Secondary | ICD-10-CM | POA: Diagnosis not present

## 2023-09-04 DIAGNOSIS — E1169 Type 2 diabetes mellitus with other specified complication: Secondary | ICD-10-CM | POA: Diagnosis not present

## 2023-09-04 DIAGNOSIS — I2609 Other pulmonary embolism with acute cor pulmonale: Secondary | ICD-10-CM | POA: Diagnosis not present

## 2023-09-05 DIAGNOSIS — M1612 Unilateral primary osteoarthritis, left hip: Secondary | ICD-10-CM | POA: Diagnosis not present

## 2023-09-10 ENCOUNTER — Other Ambulatory Visit: Payer: Self-pay

## 2023-09-10 ENCOUNTER — Telehealth: Payer: Self-pay

## 2023-09-10 NOTE — Patient Instructions (Signed)
 Visit Information  Thank you for taking time to visit with me today. Please don't hesitate to contact me if I can be of assistance to you before our next scheduled telephone appointment.  Our next appointment is by telephone on 09/18/23 at 1300 [1 pm]  Following is a copy of your care plan:   Goals Addressed             This Visit's Progress    VBCI Transitions of Care (TOC) Care Plan   Improving    Problems:  Recent Hospitalization for treatment of Pulmonary Disease Nausea and Vomiting Knowledge Deficit Related to Medications and diet - patient to start Metformin  09/04/23 - was not sure when and reviewed AVS with patient - 09/10/23 new medication for appetite was started on 09/06/23 per patient - updated Medication list  Goal:  Over the next 30 days, the patient will not experience hospital readmission  Interventions:  Transitions of Care: Health Screening reviewed with patient/caregiver, orders reviewed, and education provided Doctor Visits  - discussed the importance of doctor visits  Patient Self Care Activities:  Attend all scheduled provider appointments Call pharmacy for medication refills 3-7 days in advance of running out of medications Call provider office for new concerns or questions  Notify RN Care Manager of [facility] call rescheduling needs Participate in Transition of Care Program/Attend TOC scheduled calls Take medications as prescribed   Reviewed AVS with patient today  Plan:  Follow up with provider re: post hospital follow up and loss of appetite with appointment 09/04/23 with Dr. Benjamine Telephone follow up appointment with care management team member scheduled for:  09/10/23 at 11:00 am The care management team will reach out to the patient again over the next 5-10 business days. The patient has been provided with contact information for the care management team and has been advised to call with any health related questions or concerns.  Continue to eat slowly and follow  up with PCP if symptoms worsen for nausea and vomiting to access if new medications is helping for appetite.  Follow up in a week to check progress and changes.        Patient verbalizes understanding of instructions and care plan provided today and agrees to view in MyChart. Active MyChart status and patient understanding of how to access instructions and care plan via MyChart confirmed with patient.     Telephone follow up appointment with care management team member scheduled for: The patient has been provided with contact information for the care management team and has been advised to call with any health related questions or concerns.  The care management team will reach out to the patient again over the next 5 - 10 business days.  The patient will call PCP* as advised to assess if symptoms are worsening with nausea and vomiting.   Please call the care guide team at [phone] if you need to cancel or reschedule your appointment.   Please call the [place]  National Suicide Prevention Lifeline: (850)594-5342 or TTY: (587)810-1246 TTY (479)199-8537) to talk to a trained counselor if you are experiencing a Mental Health or Behavioral Health Crisis or need someone to talk to.  Richerd Fish, RN, BSN, CCM [facility], [facility] RN Care Manager Direct Dial: [phone]

## 2023-09-10 NOTE — Transitions of Care (Post Inpatient/ED Visit) (Signed)
 Transition of Care week 2  Visit Note  09/10/2023  Name: Angie Carr MRN: [id number]          DOB: 09/04/1955  Situation: Patient enrolled in [facility] 30-day program. Visit completed with patient by telephone.   Background:   Initial Transition Care Management Follow-up Telephone Call    Past Medical History:  Diagnosis Date   Anemia    Anxiety    Asthma    pt denies   Diabetes mellitus without complication (HCC)    DJD (degenerative joint disease)    lower back   GERD (gastroesophageal reflux disease)    Headache    miagraines, none in years   Hypertension    LBP (low back pain)    Morbid obesity (HCC)    Neuromuscular disorder (HCC)    feet tingle and her balance is sometimes a problem   Primary osteoarthritis of both knees    Smoker     Assessment: Patient Reported Symptoms: Cognitive Cognitive Status: Alert and oriented to person, place, and time, Normal speech and language skills      Neurological Neurological Review of Symptoms: Weakness Neurological Management Strategies: Adequate rest Neurological Self-Management Outcome: 3 (uncertain)  HEENT HEENT Symptoms Reported: No symptoms reported      Cardiovascular Cardiovascular Symptoms Reported: No symptoms reported Does patient have uncontrolled Hypertension?: Yes Is patient checking Blood Pressure at home?: No (BP cuff is ordered on [employer]; reminded to check [facility] for benefits) Cardiovascular Management Strategies: Adequate rest, Activity  Respiratory Respiratory Symptoms Reported: Shortness of breath Other Respiratory Symptoms: when walking Respiratory Management Strategies: Activity, Adequate rest, Medication therapy  Endocrine Endocrine Symptoms Reported: Nausea or vomiting Is patient diabetic?: Yes Is patient checking blood sugars at home?: Yes List most recent blood sugar readings, include date and time of day: not checked today, ordered new meter Endocrine Self-Management Outcome: 3 (uncertain)   Gastrointestinal Gastrointestinal Symptoms Reported: Change in appetite, Vomiting Gastrointestinal Management Strategies: Adequate rest, Medication therapy Gastrointestinal Self-Management Outcome: 3 (uncertain) Gastrointestinal Comment: Eating slowly    Genitourinary Genitourinary Symptoms Reported: No symptoms reported Genitourinary Management Strategies: Incontinence garment/pad (use a precaution) Genitourinary Self-Management Outcome: 4 (good)  Integumentary Integumentary Symptoms Reported: Wound Additional Integumentary Details: hip surgery is healed up Skin Management Strategies: Activity, Medication therapy Skin Self-Management Outcome: 4 (good)  Musculoskeletal Musculoskelatal Symptoms Reviewed: Weakness Additional Musculoskeletal Details: getting better Musculoskeletal Management Strategies: Activity, Adequate rest, Medical device, Medication therapy Musculoskeletal Self-Management Outcome: 4 (good)      Psychosocial Psychosocial Symptoms Reported: No symptoms reported         There were no vitals filed for this visit.  Medications Reviewed Today     Reviewed by Eilleen Richerd GRADE, RN (Registered Nurse) on 09/10/23 at 1104  Med List Status: <None>   Medication Order Taking? Sig Documenting Provider Last Dose Status Informant  apixaban  (ELIQUIS ) 5 MG TABS tablet [id number]  Take 2 tablets (10 mg total) by mouth 2 (two) times daily for 7 days, THEN 1 tablet (5 mg total) 2 (two) times daily. Willette Adriana LABOR, MD  Active   cilostazol  (PLETAL ) 50 MG tablet [id number]  Take 50 mg by mouth 2 (two) times daily. [provider]  Active Self, Pharmacy Records           Med Note (WHITE, TONIA S   Wed Aug 28, 2023 11:15 PM) Took yesterday, but immediately vomited.  diphenoxylate-atropine (LOMOTIL) 2.5-0.025 MG tablet [id number]  Take 1 tablet by mouth 4 (four) times daily as needed for  diarrhea or loose stools.  Patient not taking: Reported on 09/03/2023   [provider]  Active Self, Pharmacy Records           Med Note (WHITE, DONETA RAMAN   Wed Aug 28, 2023 11:30 PM) Called into the pharmacy, but she didn't know.  fesoterodine  (TOVIAZ ) 4 MG TB24 tablet [id number]  Take 4 mg by mouth daily. [provider]  Active Self, Pharmacy Records           Med Note (WHITE, TONIA S   Wed Aug 28, 2023 11:15 PM) Took yesterday, but immediately vomited.   gabapentin  (NEURONTIN ) 400 MG capsule [id number]  Take 400 mg by mouth See admin instructions. Take 1 capsule (400 mg) by mouth in the morning & take 2 capsules (800 mg) by mouth in the evening. [provider]  Active Self, Pharmacy Records           Med Note (WHITE, TONIA S   Wed Aug 28, 2023 11:15 PM) Took yesterday, but immediately vomited.   metFORMIN  (GLUCOPHAGE ) 1000 MG tablet [id number]  Take 1 tablet (1,000 mg total) by mouth 2 (two) times daily with a meal.  Patient not taking: Reported on 08/28/2023   Dennise Lavada POUR, MD  Active Self, Pharmacy Records           Med Note [facility], DONETA RAMAN [place] Aug 28, 2023 11:16 PM) On hold per provider  pantoprazole  (PROTONIX ) 40 MG tablet [id number]  Take 1 tablet (40 mg total) by mouth daily. Willette Adriana LABOR, MD  Active   Potassium Chloride  ER 20 MEQ TBCR [id number]  Take 20 mEq by mouth in the morning. [provider]  Active Self, Pharmacy Records           Med Note (WHITE, DONETA RAMAN   Wed Aug 28, 2023 11:17 PM) Took yesterday, but immediately vomited.   pravastatin  (PRAVACHOL ) 40 MG tablet [id number]  Take 40 mg by mouth daily in the afternoon. [provider]  Active Self, Pharmacy Records            Recommendation:   Continue Current Plan of Care  Follow Up Plan:   Telephone follow-up in 1 week  Richerd Fish, RN, Scientist, research (physical sciences), CCM [employer], [facility] RN Care Manager Direct Dial: [phone]

## 2023-09-15 ENCOUNTER — Inpatient Hospital Stay (HOSPITAL_COMMUNITY)
Admission: EM | Admit: 2023-09-15 | Discharge: 2023-09-19 | DRG: 178 | Disposition: A | Discharge status: Home Health | Attending: Internal Medicine | Admitting: Internal Medicine

## 2023-09-15 ENCOUNTER — Encounter (HOSPITAL_COMMUNITY): Payer: Self-pay

## 2023-09-15 ENCOUNTER — Other Ambulatory Visit: Payer: Self-pay

## 2023-09-15 ENCOUNTER — Inpatient Hospital Stay (HOSPITAL_COMMUNITY)

## 2023-09-15 ENCOUNTER — Emergency Department (HOSPITAL_COMMUNITY)

## 2023-09-15 DIAGNOSIS — Z7984 Long term (current) use of oral hypoglycemic drugs: Secondary | ICD-10-CM | POA: Diagnosis not present

## 2023-09-15 DIAGNOSIS — E11649 Type 2 diabetes mellitus with hypoglycemia without coma: Secondary | ICD-10-CM | POA: Diagnosis not present

## 2023-09-15 DIAGNOSIS — Z7902 Long term (current) use of antithrombotics/antiplatelets: Secondary | ICD-10-CM

## 2023-09-15 DIAGNOSIS — E785 Hyperlipidemia, unspecified: Secondary | ICD-10-CM | POA: Diagnosis present

## 2023-09-15 DIAGNOSIS — U071 COVID-19: Secondary | ICD-10-CM | POA: Diagnosis not present

## 2023-09-15 DIAGNOSIS — E872 Acidosis, unspecified: Secondary | ICD-10-CM | POA: Diagnosis present

## 2023-09-15 DIAGNOSIS — I5032 Chronic diastolic (congestive) heart failure: Secondary | ICD-10-CM | POA: Diagnosis not present

## 2023-09-15 DIAGNOSIS — K439 Ventral hernia without obstruction or gangrene: Secondary | ICD-10-CM | POA: Diagnosis not present

## 2023-09-15 DIAGNOSIS — R112 Nausea with vomiting, unspecified: Secondary | ICD-10-CM

## 2023-09-15 DIAGNOSIS — I7090 Unspecified atherosclerosis: Secondary | ICD-10-CM | POA: Diagnosis not present

## 2023-09-15 DIAGNOSIS — N179 Acute kidney failure, unspecified: Secondary | ICD-10-CM | POA: Diagnosis not present

## 2023-09-15 DIAGNOSIS — E8729 Other acidosis: Secondary | ICD-10-CM

## 2023-09-15 DIAGNOSIS — K224 Dyskinesia of esophagus: Secondary | ICD-10-CM | POA: Diagnosis present

## 2023-09-15 DIAGNOSIS — R1310 Dysphagia, unspecified: Secondary | ICD-10-CM | POA: Diagnosis present

## 2023-09-15 DIAGNOSIS — E1151 Type 2 diabetes mellitus with diabetic peripheral angiopathy without gangrene: Secondary | ICD-10-CM | POA: Diagnosis present

## 2023-09-15 DIAGNOSIS — R432 Parageusia: Secondary | ICD-10-CM | POA: Diagnosis present

## 2023-09-15 DIAGNOSIS — Z7901 Long term (current) use of anticoagulants: Secondary | ICD-10-CM | POA: Diagnosis not present

## 2023-09-15 DIAGNOSIS — E1169 Type 2 diabetes mellitus with other specified complication: Secondary | ICD-10-CM

## 2023-09-15 DIAGNOSIS — K5730 Diverticulosis of large intestine without perforation or abscess without bleeding: Secondary | ICD-10-CM | POA: Diagnosis not present

## 2023-09-15 DIAGNOSIS — R1084 Generalized abdominal pain: Secondary | ICD-10-CM | POA: Diagnosis not present

## 2023-09-15 DIAGNOSIS — E46 Unspecified protein-calorie malnutrition: Secondary | ICD-10-CM | POA: Diagnosis present

## 2023-09-15 DIAGNOSIS — J45909 Unspecified asthma, uncomplicated: Secondary | ICD-10-CM | POA: Diagnosis not present

## 2023-09-15 DIAGNOSIS — A0839 Other viral enteritis: Secondary | ICD-10-CM | POA: Diagnosis not present

## 2023-09-15 DIAGNOSIS — D649 Anemia, unspecified: Secondary | ICD-10-CM | POA: Diagnosis not present

## 2023-09-15 DIAGNOSIS — I110 Hypertensive heart disease with heart failure: Secondary | ICD-10-CM | POA: Diagnosis present

## 2023-09-15 DIAGNOSIS — E119 Type 2 diabetes mellitus without complications: Secondary | ICD-10-CM

## 2023-09-15 DIAGNOSIS — Z90710 Acquired absence of both cervix and uterus: Secondary | ICD-10-CM

## 2023-09-15 DIAGNOSIS — E1165 Type 2 diabetes mellitus with hyperglycemia: Secondary | ICD-10-CM | POA: Diagnosis not present

## 2023-09-15 DIAGNOSIS — Z823 Family history of stroke: Secondary | ICD-10-CM

## 2023-09-15 DIAGNOSIS — R630 Anorexia: Secondary | ICD-10-CM | POA: Diagnosis not present

## 2023-09-15 DIAGNOSIS — Z716 Tobacco abuse counseling: Secondary | ICD-10-CM

## 2023-09-15 DIAGNOSIS — Z96642 Presence of left artificial hip joint: Secondary | ICD-10-CM | POA: Diagnosis present

## 2023-09-15 DIAGNOSIS — Z720 Tobacco use: Secondary | ICD-10-CM | POA: Diagnosis not present

## 2023-09-15 DIAGNOSIS — I2699 Other pulmonary embolism without acute cor pulmonale: Secondary | ICD-10-CM | POA: Diagnosis not present

## 2023-09-15 DIAGNOSIS — I739 Peripheral vascular disease, unspecified: Secondary | ICD-10-CM | POA: Diagnosis not present

## 2023-09-15 DIAGNOSIS — Z9049 Acquired absence of other specified parts of digestive tract: Secondary | ICD-10-CM

## 2023-09-15 DIAGNOSIS — E039 Hypothyroidism, unspecified: Secondary | ICD-10-CM | POA: Diagnosis not present

## 2023-09-15 DIAGNOSIS — F17210 Nicotine dependence, cigarettes, uncomplicated: Secondary | ICD-10-CM | POA: Diagnosis not present

## 2023-09-15 DIAGNOSIS — M170 Bilateral primary osteoarthritis of knee: Secondary | ICD-10-CM | POA: Diagnosis present

## 2023-09-15 DIAGNOSIS — E860 Dehydration: Principal | ICD-10-CM | POA: Diagnosis present

## 2023-09-15 DIAGNOSIS — E782 Mixed hyperlipidemia: Secondary | ICD-10-CM | POA: Diagnosis present

## 2023-09-15 DIAGNOSIS — Z6839 Body mass index (BMI) 39.0-39.9, adult: Secondary | ICD-10-CM

## 2023-09-15 DIAGNOSIS — I2782 Chronic pulmonary embolism: Secondary | ICD-10-CM | POA: Diagnosis not present

## 2023-09-15 DIAGNOSIS — I700 Atherosclerosis of aorta: Secondary | ICD-10-CM | POA: Diagnosis not present

## 2023-09-15 DIAGNOSIS — E876 Hypokalemia: Secondary | ICD-10-CM | POA: Diagnosis present

## 2023-09-15 DIAGNOSIS — Z8249 Family history of ischemic heart disease and other diseases of the circulatory system: Secondary | ICD-10-CM

## 2023-09-15 DIAGNOSIS — Z86718 Personal history of other venous thrombosis and embolism: Secondary | ICD-10-CM

## 2023-09-15 DIAGNOSIS — K219 Gastro-esophageal reflux disease without esophagitis: Secondary | ICD-10-CM | POA: Diagnosis present

## 2023-09-15 DIAGNOSIS — R634 Abnormal weight loss: Secondary | ICD-10-CM | POA: Diagnosis not present

## 2023-09-15 DIAGNOSIS — Z79899 Other long term (current) drug therapy: Secondary | ICD-10-CM

## 2023-09-15 DIAGNOSIS — K429 Umbilical hernia without obstruction or gangrene: Secondary | ICD-10-CM | POA: Diagnosis not present

## 2023-09-15 LAB — URINALYSIS, ROUTINE W REFLEX MICROSCOPIC
Glucose, UA: NEGATIVE mg/dL
Ketones, ur: 80 mg/dL — AB
Nitrite: NEGATIVE
Protein, ur: 100 mg/dL — AB
Specific Gravity, Urine: 1.027 (ref 1.005–1.030)
pH: 6 (ref 5.0–8.0)

## 2023-09-15 LAB — RESP PANEL BY RT-PCR (RSV, FLU A&B, COVID)  RVPGX2
Influenza A by PCR: NEGATIVE
Influenza B by PCR: NEGATIVE
Resp Syncytial Virus by PCR: NEGATIVE
SARS Coronavirus 2 by RT PCR: POSITIVE — AB

## 2023-09-15 LAB — CBC
HCT: 39.7 % (ref 36.0–46.0)
Hemoglobin: 12.7 g/dL (ref 12.0–15.0)
MCH: 30.9 pg (ref 26.0–34.0)
MCHC: 32 g/dL (ref 30.0–36.0)
MCV: 96.6 fL (ref 80.0–100.0)
Platelets: 225 K/uL (ref 150–400)
RBC: 4.11 MIL/uL (ref 3.87–5.11)
RDW: 16.6 % — ABNORMAL HIGH (ref 11.5–15.5)
WBC: 3.2 K/uL — ABNORMAL LOW (ref 4.0–10.5)
nRBC: 0 % (ref 0.0–0.2)

## 2023-09-15 LAB — CBG MONITORING, ED
Glucose-Capillary: 62 mg/dL — ABNORMAL LOW (ref 70–99)
Glucose-Capillary: 66 mg/dL — ABNORMAL LOW (ref 70–99)
Glucose-Capillary: 76 mg/dL (ref 70–99)
Glucose-Capillary: 99 mg/dL (ref 70–99)

## 2023-09-15 LAB — RETICULOCYTES
Immature Retic Fract: 12.6 % (ref 2.3–15.9)
RBC.: 4.14 MIL/uL (ref 3.87–5.11)
Retic Count, Absolute: 69.6 K/uL (ref 19.0–186.0)
Retic Ct Pct: 1.7 % (ref 0.4–3.1)

## 2023-09-15 LAB — COMPREHENSIVE METABOLIC PANEL WITH GFR
ALT: 18 U/L (ref 0–44)
AST: 22 U/L (ref 15–41)
Albumin: 3.6 g/dL (ref 3.5–5.0)
Alkaline Phosphatase: 75 U/L (ref 38–126)
Anion gap: 23 — ABNORMAL HIGH (ref 5–15)
BUN: 20 mg/dL (ref 8–23)
CO2: 16 mmol/L — ABNORMAL LOW (ref 22–32)
Calcium: 8.6 mg/dL — ABNORMAL LOW (ref 8.9–10.3)
Chloride: 93 mmol/L — ABNORMAL LOW (ref 98–111)
Creatinine, Ser: 1.17 mg/dL — ABNORMAL HIGH (ref 0.44–1.00)
GFR, Estimated: 51 mL/min — ABNORMAL LOW (ref >60)
Glucose, Bld: 65 mg/dL — ABNORMAL LOW (ref 70–99)
Potassium: 3.6 mmol/L (ref 3.5–5.1)
Sodium: 132 mmol/L — ABNORMAL LOW (ref 135–145)
Total Bilirubin: 1.9 mg/dL — ABNORMAL HIGH (ref 0.0–1.2)
Total Protein: 7.4 g/dL (ref 6.5–8.1)

## 2023-09-15 LAB — MAGNESIUM: Magnesium: 1 mg/dL — ABNORMAL LOW (ref 1.7–2.4)

## 2023-09-15 LAB — LIPASE, BLOOD: Lipase: 26 U/L (ref 11–51)

## 2023-09-15 LAB — TSH: TSH: 2.688 u[IU]/mL (ref 0.350–4.500)

## 2023-09-15 MED ORDER — GLUCOSE 40 % PO GEL
1.0000 | Freq: Once | ORAL | Status: AC
  Administered 2023-09-15: 31 g via ORAL
  Filled 2023-09-15: qty 1.21

## 2023-09-15 MED ORDER — DEXTROSE 50 % IV SOLN
25.0000 g | Freq: Once | INTRAVENOUS | Status: AC
  Administered 2023-09-15: 25 g via INTRAVENOUS
  Filled 2023-09-15: qty 50

## 2023-09-15 MED ORDER — FOLIC ACID 1 MG PO TABS
1.0000 mg | ORAL_TABLET | Freq: Once | ORAL | Status: AC
  Administered 2023-09-16: 1 mg via ORAL
  Filled 2023-09-15: qty 1

## 2023-09-15 MED ORDER — THIAMINE HCL 100 MG/ML IJ SOLN
100.0000 mg | Freq: Once | INTRAMUSCULAR | Status: AC
  Administered 2023-09-16: 100 mg via INTRAVENOUS
  Filled 2023-09-15: qty 2

## 2023-09-15 MED ORDER — MAGNESIUM SULFATE 2 GM/50ML IV SOLN
2.0000 g | Freq: Once | INTRAVENOUS | Status: AC
  Administered 2023-09-16: 2 g via INTRAVENOUS
  Filled 2023-09-15: qty 50

## 2023-09-15 MED ORDER — LACTATED RINGERS IV BOLUS
1000.0000 mL | Freq: Once | INTRAVENOUS | Status: AC
  Administered 2023-09-15: 1000 mL via INTRAVENOUS

## 2023-09-15 MED ORDER — IOHEXOL 350 MG/ML SOLN
75.0000 mL | Freq: Once | INTRAVENOUS | Status: AC | PRN
  Administered 2023-09-15: 75 mL via INTRAVENOUS

## 2023-09-15 NOTE — Assessment & Plan Note (Signed)
 Will order nutritional consult check prealbumin Check magnesium  and phosphate levels and replace as needed Administer thiamine 

## 2023-09-15 NOTE — ED Notes (Signed)
 CBG [facility], RN notified. Pt given juice to drink.

## 2023-09-15 NOTE — Assessment & Plan Note (Signed)
History of PE continue Eliquis

## 2023-09-15 NOTE — Assessment & Plan Note (Signed)
Will replace and recheck 

## 2023-09-15 NOTE — ED Provider Notes (Signed)
 [place] EMERGENCY DEPARTMENT AT [facility] Provider Note HPI Angie Carr is a 68 y.o. female with a PMH of T2DM, HTN, PAD, PE on Eliquis  below who presents to the emergency department with 30 days of decreased p.o. intake, nausea, vomiting, dizziness and a sensation that she has lost taste.  Patient states that she has lost about 40 pounds in the last month to the symptoms.  She is around her grandkids daily.  Denies chest pain or shortness of breath.  Denies diarrhea.  Past Medical History:  Diagnosis Date   Anemia    Anxiety    Asthma    pt denies   Diabetes mellitus without complication (HCC)    DJD (degenerative joint disease)    lower back   GERD (gastroesophageal reflux disease)    Headache    miagraines, none in years   Hypertension    LBP (low back pain)    Morbid obesity (HCC)    Neuromuscular disorder (HCC)    feet tingle and her balance is sometimes a problem   Primary osteoarthritis of both knees    Smoker    Past Surgical History:  Procedure Laterality Date   ABDOMINAL HYSTERECTOMY     complete   CHOLECYSTECTOMY     laparascopic   COLONOSCOPY N/A 02/11/2014   Procedure: COLONOSCOPY;  Surgeon: Lamar Donnald GAILS, MD;  Location: [facility];  Service: Endoscopy;  Laterality: N/A;   HERNIA REPAIR     ventral on left side.   TOTAL HIP ARTHROPLASTY Left 07/24/2023   Procedure: ARTHROPLASTY, HIP, TOTAL,POSTERIOR APPROACH;  Surgeon: Edna Toribio LABOR, MD;  Location: [facility];  Service: Orthopedics;  Laterality: Left;   TUBAL LIGATION      Review of Systems Pertinent positives and negative findings are listed as part of the History of Present Illness and MDM  Physical Exam Vitals:   09/15/23 1719 09/15/23 1824 09/15/23 2015 09/15/23 2045  BP: 131/67 122/84 (!) 131/95 116/76  Pulse: 84 77 87 77  Resp: 18 18 17 16   Temp: 99.2 F (37.3 C) 97.9 F (36.6 C)    TempSrc: Oral Oral    SpO2: 100% 100% 100% 100%     Constitutional Nursing notes  reviewed Vital signs reviewed  HEENT No obvious trauma Pupils cross midline Neck supple  Respiratory Effort normal Breathing well on room air CTAB  CV Normal rate and rhythm   Abdomen Soft, generalized tenderness to deep palpation, worse in the right lower quadrant  MSK Atraumatic No obvious deformity ROM appropriate  Neuro Awake and alert Pupils cross midline Moving all extremities    MDM:  Initial Differential Diagnoses includes viral URI, dehydration, electrolyte abnormality, anemia, enteritis, intra-abdominal infection/surgical emergency, UTI  I reviewed the patient's vitals, the nursing triage note and evaluated the patient at bedside.   I reviewed the patient's external records which show that the patient was recently placed on Eliquis  for provoked PE.  She was also recently taken off of all diabetic meds due to well-controlled diabetes.  Initial labs interpreted by myself show concern for dehydration.  Patient has metabolic acidosis with bicarb of 16 and anion gap of 23.  Glucose of 65.  She is not in any sulfonylureas.  She was previously on metformin .  Patient was given oral glucose.  She has an AKI with creatinine of 1.17.  Bilirubin to 1.9 without evidence of cholestatic liver pattern.  This is likely secondary to dehydration.  Magnesium  of 1.0.  Patient had persistent hypoglycemia to 66  and was given thiamine , IV dextrose , folate, magnesium  and fluids.  EKG interpreted myself shows normal sinus rhythm without any evidence of high-grade conduction block or new onset arrhythmia.  No acute ischemic changes.  I considered intra-abdominal pathology given her physical exam and history of 40 pound weight loss in the last month.  CT shows normal appendix and diverticulosis without diverticulitis.  No evidence of obstruction.  There may be some enteritis which may be contributing to the patient's symptoms.  Patient admitted for dehydration, hypoglycemia, and electrolyte  derangements.   Procedures: Procedures  Medications administered in the ED: Medications  thiamine  (VITAMIN B1) injection 100 mg (has no administration in time range)  folic acid  (FOLVITE ) tablet 1 mg (has no administration in time range)  magnesium  sulfate IVPB 2 g 50 mL (has no administration in time range)  lactated ringers  bolus 1,000 mL (1,000 mLs Intravenous New Bag/Given 09/15/23 2246)  dextrose  (GLUTOSE) oral gel 40% (peds > 20kg and adults) (31 g Oral Given 09/15/23 1936)  iohexol  (OMNIPAQUE ) 350 MG/ML injection 75 mL (75 mLs Intravenous Contrast Given 09/15/23 2236)  dextrose  50 % solution 25 g (25 g Intravenous Given 09/15/23 2246)     Impression: 1. Dehydration   2. Hypomagnesemia   3. COVID-19   4. High anion gap metabolic acidosis      Patient's presentation is most consistent with acute presentation with potential threat to life or bodily function.  Disposition: ED Disposition:  Admit    ED Discharge Orders     None             Dionisio Blunt, MD 09/15/23 2318    Bernard Drivers, MD 09/16/23 1742

## 2023-09-15 NOTE — H&P (Signed)
 Angie Carr FMW:[id number] DOB: [birth year] DOA: 09/15/2023     PCP: Benjamine Aland, MD     Patient arrived to ER on 09/15/23 at 1712 Referred by Attending Bernard Drivers, MD [facility] in the past  Patient coming from:    home Lives With family     Chief Complaint:   Chief Complaint  Patient presents with   Abdominal Pain   Vomiting   Diarrhea    HPI: Angie Carr is a 68 y.o. female with medical history significant of DM2 , HTN, peripheral vascular disease history of PE currently on Eliquis , anxiety asthma hypertension neuromuscular disorder    Presented with decreased p.o. intake nausea vomiting weight loss  Hx of DM2 stopped her metformin  has had decreased PO intake lost taste  Lost weight 40 lb over past few months Has been hypoglycemic Has had prior admission for decreased p.o. intake  End of August CT scan showed diffuse pulmonary embolism in the right middle and right lower lobe Patient has known history of peripheral arterial disease for which she takes Pletal       Patient reports exposure to grandkids who have been ill recently  Patient reports that for the past 6 weeks or so she has been having nausea and vomiting every time she eats sometimes right after she eats sometimes sometimes a little later. She reports there is no taste to her food. She have had CT imaging of chest and abdomen done in August and again today  She did had a recent new diagnosis of PE Patient reports that she have had a colonoscopy that was about 10 years ago reports she has had regular mammograms  Denies significant ETOH intake   Does  smoke  but interested in quitting    Regarding pertinent Chronic problems:    Hyperlipidemia -  on statins Pravachol  (pravastatin )  Lipid Panel     Component Value Date/Time   CHOL 141 11/04/2012 1142   TRIG 193 (H) 11/04/2012 1142   HDL 35 (L) 11/04/2012 1142   CHOLHDL 4.0 11/04/2012 1142   VLDL 39 11/04/2012 1142   LDLCALC 67  11/04/2012 1142      chronic CHF diastolic  - last echo  Recent Results (from the past 56199 hours)  ECHOCARDIOGRAM COMPLETE   Collection Time: 08/30/23  9:35 AM  Result Value   Weight 3,696   Height 64   BP 108/70   S' Lateral 2.00   Area-P 1/2 3.53   Est EF 55 - 60%   Narrative      ECHOCARDIOGRAM REPORT       IMPRESSIONS    1. Left ventricular ejection fraction, by estimation, is 55 to 60%. Left ventricular ejection fraction by 3D volume is 57 %. The left ventricle has normal function. The left ventricle has no regional wall motion abnormalities. There is moderate  concentric left ventricular hypertrophy. Left ventricular diastolic parameters are consistent with Grade I diastolic dysfunction (impaired relaxation).  2. Right ventricular systolic function is normal. The right ventricular size is normal. There is normal pulmonary artery systolic pressure. The estimated right ventricular systolic pressure is 27.7 mmHg.  3. The mitral valve is degenerative. Trivial mitral valve regurgitation.  4. The aortic valve is tricuspid. Aortic valve regurgitation is mild. Aortic valve sclerosis/calcification is present, without any evidence of aortic stenosis.  5. Aortic dilatation noted. There is mild dilatation of the ascending aorta, measuring 41 mm.  6. The inferior vena cava is dilated in size with >  50% respiratory variability, suggesting right atrial pressure of 8 mmHg.            DM 2 -  Lab Results  Component Value Date   HGBA1C 4.8 07/19/2023     diet controlled     obesity-   BMI Readings from Last 1 Encounters:  08/28/23 39.65 kg/m       Hx of DVT/PE on - anticoagulation with   Eliquis ,     Chronic anemia - baseline hg Hemoglobin & Hematocrit  Recent Labs    08/29/23 0416 08/30/23 1024 09/15/23 1721  HGB 10.0* 10.7* 12.7   Iron/TIBC/Ferritin/ %Sat    Component Value Date/Time   IRON 16 (L) 12/11/2014 2344   TIBC 206 (L) 12/11/2014 2344   FERRITIN 355 (H)  12/11/2014 2344   IRONPCTSAT 8 (L) 12/11/2014 2344     While in ER:     Found to have multiple electrolyte abnormalities and COVID infection    Lab Orders         Resp panel by RT-PCR (RSV, Flu A&B, Covid) Anterior Nasal Swab         Lipase, blood         Comprehensive metabolic panel         CBC         Urinalysis, Routine w reflex microscopic -Urine, Clean Catch         Magnesium          Phosphorus         CBG monitoring, ED         CBG monitoring, ED         POC CBG, ED       CXR -  NON acute  CTabd/pelvis -  Mild small bowel prominence centrally although no true obstructive changes are seen. This may represent a degree of enteritis.   Diverticulosis without diverticulitis.   Normal-appearing appendix.   Stable hernias as described above.    Following Medications were ordered in ER: Medications  thiamine  (VITAMIN B1) injection 100 mg (has no administration in time range)  folic acid  (FOLVITE ) tablet 1 mg (has no administration in time range)  magnesium  sulfate IVPB 2 g 50 mL (has no administration in time range)  lactated ringers  bolus 1,000 mL (1,000 mLs Intravenous New Bag/Given 09/15/23 2246)  dextrose  (GLUTOSE) oral gel 40% (peds > 20kg and adults) (31 g Oral Given 09/15/23 1936)  iohexol  (OMNIPAQUE ) 350 MG/ML injection 75 mL (75 mLs Intravenous Contrast Given 09/15/23 2236)  dextrose  50 % solution 25 g (25 g Intravenous Given 09/15/23 2246)        ED Triage Vitals  Encounter Vitals Group     BP 09/15/23 1719 131/67     Girls Systolic BP Percentile --      Girls Diastolic BP Percentile --      Boys Systolic BP Percentile --      Boys Diastolic BP Percentile --      Pulse Rate 09/15/23 1719 84     Resp 09/15/23 1719 18     Temp 09/15/23 1719 99.2 F (37.3 C)     Temp Source 09/15/23 1719 Oral     SpO2 09/15/23 1713 100 %     Weight --      Height --      Head Circumference --      Peak Flow --      Pain Score 09/15/23 1717 2     Pain Loc --  Pain Education --      Exclude from Growth Chart --   [id number]     _________________________________________ Significant initial  Findings: Abnormal Labs Reviewed  RESP PANEL BY RT-PCR (RSV, FLU A&B, COVID)  [id number] - Abnormal; Notable for the following components:      Result Value   SARS Coronavirus 2 by RT PCR POSITIVE (*)    All other components within normal limits  COMPREHENSIVE METABOLIC PANEL WITH GFR - Abnormal; Notable for the following components:   Sodium 132 (*)    Chloride 93 (*)    CO2 16 (*)    Glucose, Bld 65 (*)    Creatinine, Ser 1.17 (*)    Calcium  8.6 (*)    Total Bilirubin 1.9 (*)    GFR, Estimated 51 (*)    Anion gap 23 (*)    All other components within normal limits  CBC - Abnormal; Notable for the following components:   WBC 3.2 (*)    RDW 16.6 (*)    All other components within normal limits  URINALYSIS, ROUTINE W REFLEX MICROSCOPIC - Abnormal; Notable for the following components:   Color, Urine AMBER (*)    APPearance HAZY (*)    Hgb urine dipstick MODERATE (*)    Bilirubin Urine SMALL (*)    Ketones, ur 80 (*)    Protein, ur 100 (*)    Leukocytes,Ua TRACE (*)    Bacteria, UA MANY (*)    All other components within normal limits  MAGNESIUM  - Abnormal; Notable for the following components:   Magnesium  1.0 (*)    All other components within normal limits  CBG MONITORING, ED - Abnormal; Notable for the following components:   Glucose-Capillary 62 (*)    All other components within normal limits  CBG MONITORING, ED - Abnormal; Notable for the following components:   Glucose-Capillary 66 (*)    All other components within normal limits      ECG: Ordered Personally reviewed and interpreted by me showing: HR : 76 Rhythm: Sinus rhythm Non-specific ST-t changes No significant change since last tracing QTC 437     The recent clinical data is shown below. Vitals:   09/15/23 1719 09/15/23 1824 09/15/23 2015 09/15/23 2045  BP: 131/67 122/84 (!)  131/95 116/76  Pulse: 84 77 87 77  Resp: 18 18 17 16   Temp: 99.2 F (37.3 C) 97.9 F (36.6 C)    TempSrc: Oral Oral    SpO2: 100% 100% 100% 100%      WBC     Component Value Date/Time   WBC 3.2 (L) 09/15/2023 1721   LYMPHSABS 1.7 07/19/2023 1007   MONOABS 0.4 07/19/2023 1007   EOSABS 0.1 07/19/2023 1007   BASOSABS 0.1 07/19/2023 1007       Procalcitonin   Ordered      UA   no evidence of UTI  multiple bacteria   Urine analysis:    Component Value Date/Time   COLORURINE AMBER (A) 09/15/2023 2249   APPEARANCEUR HAZY (A) 09/15/2023 2249   LABSPEC 1.027 09/15/2023 2249   PHURINE 6.0 09/15/2023 2249   GLUCOSEU NEGATIVE 09/15/2023 2249   HGBUR MODERATE (A) 09/15/2023 2249   BILIRUBINUR SMALL (A) 09/15/2023 2249   KETONESUR 80 (A) 09/15/2023 2249   PROTEINUR 100 (A) 09/15/2023 2249   NITRITE NEGATIVE 09/15/2023 2249   LEUKOCYTESUR TRACE (A) 09/15/2023 2249    Results for orders placed or performed during the hospital encounter of 09/15/23  Resp panel by RT-PCR (RSV,  Flu A&B, Covid) Anterior Nasal Swab     Status: Abnormal   Collection Time: 09/15/23  6:03 PM   Specimen: Anterior Nasal Swab  Result Value Ref Range Status   SARS Coronavirus 2 by RT PCR POSITIVE (A) NEGATIVE Final   Influenza A by PCR NEGATIVE NEGATIVE Final   Influenza B by PCR NEGATIVE NEGATIVE Final         Resp Syncytial Virus by PCR NEGATIVE NEGATIVE Final          ____vbg pending   __________________________________________________________ Recent Labs  Lab 09/15/23 1721 09/15/23 1941  NA 132*  --   K 3.6  --   CO2 16*  --   GLUCOSE 65*  --   BUN 20  --   CREATININE 1.17*  --   CALCIUM  8.6*  --   MG  --  1.0*    Cr   Up from baseline see below Lab Results  Component Value Date   CREATININE 1.17 (H) 09/15/2023   CREATININE 0.85 08/29/2023   CREATININE 0.94 08/28/2023    Recent Labs  Lab 09/15/23 1721  AST 22  ALT 18  ALKPHOS 75  BILITOT 1.9*  PROT 7.4  ALBUMIN 3.6    Lab Results  Component Value Date   CALCIUM  8.6 (L) 09/15/2023       Plt: Lab Results  Component Value Date   PLT 225 09/15/2023       Recent Labs  Lab 09/15/23 1721  WBC 3.2*  HGB 12.7  HCT 39.7  MCV 96.6  PLT 225    HG/HCT stable     Component Value Date/Time   HGB 12.7 09/15/2023 1721   HCT 39.7 09/15/2023 1721   HCT 31.3 (L) 12/11/2014 2344   MCV 96.6 09/15/2023 1721      Recent Labs  Lab 09/15/23 1721  LIPASE 26   No results for input(s): AMMONIA in the last 168 hours.    _______________________________________________ Hospitalist was called for admission for AKI dehydration malnutrition COVID infection   The following Work up has been ordered so far:  Orders Placed This Encounter  Procedures   Resp panel by RT-PCR (RSV, Flu A&B, Covid) Anterior Nasal Swab   CT ABDOMEN PELVIS W CONTRAST   Lipase, blood   Comprehensive metabolic panel   CBC   Urinalysis, Routine w reflex microscopic -Urine, Clean Catch   Magnesium    Phosphorus   Diet NPO time specified   Consult to hospitalist   CBG monitoring, ED   CBG monitoring, ED   POC CBG, ED   ED EKG   EKG 12-Lead     OTHER Significant initial  Findings:  labs showing:     DM  labs:  HbA1C: Recent Labs    07/19/23 1007  HGBA1C 4.8       CBG (last 3)  Recent Labs    09/15/23 1936 09/15/23 2014 09/15/23 2144  GLUCAP 62* 76 66*          Cultures:    Component Value Date/Time   SDES BLOOD RIGHT HAND 12/11/2014 2004   SPECREQUEST BOTTLES DRAWN AEROBIC ONLY 2CC 12/11/2014 2004   CULT NO GROWTH 5 DAYS 12/11/2014 2004   REPTSTATUS 12/16/2014 FINAL 12/11/2014 2004     Radiological Exams on Admission: CT ABDOMEN PELVIS W CONTRAST Result Date: 09/15/2023 CLINICAL DATA:  Right lower quadrant pain EXAM: CT ABDOMEN AND PELVIS WITH CONTRAST TECHNIQUE: Multidetector CT imaging of the abdomen and pelvis was performed using the standard protocol following bolus  administration of  intravenous contrast. RADIATION DOSE REDUCTION: This exam was performed according to the departmental dose-optimization program which includes automated exposure control, adjustment of the mA and/or kV according to patient size and/or use of iterative reconstruction technique. CONTRAST:  75mL OMNIPAQUE  IOHEXOL  350 MG/ML SOLN COMPARISON:  08/28/2023 FINDINGS: Lower chest: Lung bases are free of acute infiltrate or sizable effusion. No incidental pulmonary emboli seen. Hepatobiliary: No focal liver abnormality is seen. Status post cholecystectomy. No biliary dilatation. Pancreas: Unremarkable. No pancreatic ductal dilatation or surrounding inflammatory changes. Spleen: Normal in size without focal abnormality. Adrenals/Urinary Tract: Adrenal glands are within normal limits. Kidneys demonstrate a normal enhancement pattern bilaterally. No renal calculi or obstructive changes are seen. The bladder is decompressed. Stomach/Bowel: Diverticular change of the colon is noted without evidence of diverticulitis. This is stable from the prior exam. Transverse colon again extends into a ventral hernia similar to that noted on the prior study. A small fat containing hernia is noted just below and to the right of this more superior ventral hernia. The proximal colon is within normal limits. The appendix is unremarkable. Small bowel and stomach are mildly prominent without true obstruction which may represent a degree of enteritis. Vascular/Lymphatic: Aortic atherosclerosis. No enlarged abdominal or pelvic lymph nodes. Reproductive: Status post hysterectomy. No adnexal masses. Other: No free fluid is noted. A fat containing periumbilical hernia is noted on the right similar to that seen on the prior exam. A small ventral hernia is noted just superior to that containing a portion of the transverse colon without obstructive change. The overall appearance is stable. Musculoskeletal: Left hip replacement is noted. Degenerative changes  of the right hip joint and lumbar spine are noted. IMPRESSION: Mild small bowel prominence centrally although no true obstructive changes are seen. This may represent a degree of enteritis. Diverticulosis without diverticulitis. Normal-appearing appendix. Stable hernias as described above. Electronically Signed   By: Oneil Devonshire M.D.   On: 09/15/2023 22:42   _______________________________________________________________________________________________________ Latest  Blood pressure 116/76, pulse 77, temperature 97.9 F (36.6 C), temperature source Oral, resp. rate 16, SpO2 100%.   Vitals  labs and radiology finding personally reviewed  Review of Systems:    Pertinent positives include:  , nausea, vomiting, diarrhea,  Constitutional:  No weight loss, night sweats, Fevers, chills, fatigue, weight loss  HEENT:  No headaches, Difficulty swallowing,Tooth/dental problems,Sore throat,  No sneezing, itching, ear ache, nasal congestion, post nasal drip,  Cardio-vascular:  No chest pain, Orthopnea, PND, anasarca, dizziness, palpitations.no Bilateral lower extremity swelling  GI:  No heartburn, indigestion, abdominal pain change in bowel habits, loss of appetite, melena, blood in stool, hematemesis Resp:  no shortness of breath at rest. No dyspnea on exertion, No excess mucus, no productive cough, No non-productive cough, No coughing up of blood.No change in color of mucus.No wheezing. Skin:  no rash or lesions. No jaundice GU:  no dysuria, change in color of urine, no urgency or frequency. No straining to urinate.  No flank pain.  Musculoskeletal:  No joint pain or no joint swelling. No decreased range of motion. No back pain.  Psych:  No change in mood or affect. No depression or anxiety. No memory loss.  Neuro: no localizing neurological complaints, no tingling, no weakness, no double vision, no gait abnormality, no slurred speech, no confusion  All systems reviewed and apart from HOPI all  are negative _______________________________________________________________________________________________ Past Medical History:   Past Medical History:  Diagnosis Date   Anemia    Anxiety  Asthma    pt denies   Diabetes mellitus without complication (HCC)    DJD (degenerative joint disease)    lower back   GERD (gastroesophageal reflux disease)    Headache    miagraines, none in years   Hypertension    LBP (low back pain)    Morbid obesity (HCC)    Neuromuscular disorder (HCC)    feet tingle and her balance is sometimes a problem   Primary osteoarthritis of both knees    Smoker      Past Surgical History:  Procedure Laterality Date   ABDOMINAL HYSTERECTOMY     complete   CHOLECYSTECTOMY     laparascopic   COLONOSCOPY N/A 02/11/2014   Procedure: COLONOSCOPY;  Surgeon: Lamar Donnald GAILS, MD;  Location: [facility];  Service: Endoscopy;  Laterality: N/A;   HERNIA REPAIR     ventral on left side.   TOTAL HIP ARTHROPLASTY Left 07/24/2023   Procedure: ARTHROPLASTY, HIP, TOTAL,POSTERIOR APPROACH;  Surgeon: Edna Toribio LABOR, MD;  Location: [facility];  Service: Orthopedics;  Laterality: Left;   TUBAL LIGATION      Social History:  Ambulatory  cane, walker      reports that she has been smoking cigarettes. She has never used smokeless tobacco. She reports current drug use. Drug: Marijuana. She reports that she does not drink alcohol .   Family History:   Family History  Problem Relation Age of Onset   Hypertension Other    Stroke Other    ______________________________________________________________________________________________ Allergies: No Known Allergies   Prior to Admission medications   Medication Sig Start Date End Date Taking? Authorizing Provider  apixaban  (ELIQUIS ) 5 MG TABS tablet Take 2 tablets (10 mg total) by mouth 2 (two) times daily for 7 days, THEN 1 tablet (5 mg total) 2 (two) times daily. 08/30/23 12/05/23  Willette Adriana LABOR, MD  cilostazol   (PLETAL ) 50 MG tablet Take 50 mg by mouth 2 (two) times daily.    [provider]  diphenoxylate-atropine (LOMOTIL) 2.5-0.025 MG tablet Take 1 tablet by mouth 4 (four) times daily as needed for diarrhea or loose stools. 08/22/23   [provider]  fesoterodine  (TOVIAZ ) 4 MG TB24 tablet Take 4 mg by mouth daily. 06/28/23   [provider]  gabapentin  (NEURONTIN ) 400 MG capsule Take 400 mg by mouth See admin instructions. Take 1 capsule (400 mg) by mouth in the morning & take 2 capsules (800 mg) by mouth in the evening.    [provider]  metFORMIN  (GLUCOPHAGE ) 1000 MG tablet Take 1 tablet (1,000 mg total) by mouth 2 (two) times daily with a meal. 12/16/14   Dennise Lavada POUR, MD  mirtazapine  (REMERON ) 7.5 MG tablet Take 7.5 mg by mouth daily.    [provider]  nebivolol  (BYSTOLIC ) 5 MG tablet Take 5 mg by mouth daily.    [provider]  pantoprazole  (PROTONIX ) 40 MG tablet Take 1 tablet (40 mg total) by mouth daily. 08/30/23 11/28/23  Willette Adriana LABOR, MD  Potassium Chloride  ER 20 MEQ TBCR Take 20 mEq by mouth in the morning. 06/28/23   [provider]  pravastatin  (PRAVACHOL ) 40 MG tablet Take 40 mg by mouth daily in the afternoon.    [provider]    ___________________________________________________________________________________________________ Physical Exam:    09/15/2023    8:45 PM 09/15/2023    8:15 PM 09/15/2023    6:24 PM  Vitals with BMI  Systolic 116 131 877  Diastolic 76 95 84  Pulse  77 87 77     1. General:  in No  Acute distress   Chronically ill   -appearing 2. Psychological: Alert and   Oriented 3. Head/ENT:    Dry Mucous Membranes                          Head Non traumatic, neck supple                        Poor Dentition 4. SKIN:  decreased Skin turgor,  Skin clean Dry and intact no rash    5. Heart: Regular rate and rhythm no  Murmur, no Rub or gallop 6. Lungs:  no wheezes or crackles    7. Abdomen: Soft,  non-tender, Non distended   obese  bowel sounds present 8. Lower extremities: no clubbing, cyanosis, no  edema 9. Neurologically Grossly intact, moving all 4 extremities equally   10. MSK: Normal range of motion    Chart has been reviewed  ______________________________________________________________________________________________  Assessment/Plan 68 y.o. female with medical history significant of DM2 , HTN, peripheral vascular disease history of PE currently on Eliquis , anxiety asthma hypertension neuromuscular disorder   Admitted for AKI dehydration malnutrition COVID infection   Present on Admission:  Dehydration  Absolute anemia  COVID-19 virus infection  Acute kidney injury (HCC)  Hyperlipidemia LDL goal <70  Peripheral arterial disease (HCC)  Pulmonary emboli (HCC)  Hypomagnesemia  Protein calorie malnutrition (HCC)  Uncontrolled type 2 diabetes mellitus with hypoglycemia, without long-term current use of insulin  (HCC)  Tobacco abuse     Absolute anemia Obtain anemia panel  Transfuse for Hg <7 , rapidly dropping or  if symptomatic   COVID-19 virus infection Supportive management No evidence of pulmonary infiltrate or hypoxia at this time  Acute kidney injury (HCC) Gently rehydrate and continue to monitor Obtain urine electrolytes  Dehydration Rehydrate and follow fluid status check orthostatics prior to discharge  Diabetes mellitus, type 2 (HCC) Check hemoglobin A1c patient has been persistently hypoglycemic.  Avoid insulin  for now continue to hold metformin   Hyperlipidemia LDL goal <70 Check CK would not start statins for now  Peripheral arterial disease (HCC)   CT abdomen with contrast but not a CTA did not show Any significant evidence of ischemia  Pt was told that it is hard to find her pulse Order ABI of lower ext  Pulmonary emboli (HCC) History of PE continue Eliquis   Hypomagnesemia Will replace and recheck  Protein  calorie malnutrition (HCC) Will order nutritional consult check prealbumin Check magnesium  and phosphate levels and replace as needed Administer thiamine   Uncontrolled type 2 diabetes mellitus with hypoglycemia, without long-term current use of insulin  (HCC) Likely secondary to long-term poor p.o. intake follow serial CBG and correct  Tobacco abuse  - Spoke about importance of quitting spent 5 minutes discussing options for treatment, prior attempts at quitting, and dangers of smoking  -At this point patient is    interested in quitting  - declined nicotine patch   - nursing tobacco cessation protocol   Other plan as per orders.  DVT prophylaxis:  eliquis    Code Status:    Code Status: Prior FULL CODE as per patient   I had personally discussed CODE STATUS with patient   ACP   none   Family Communication:   Family not at  Bedside    Diet  Diet Orders (From admission, onward)     Start  Ordered   09/15/23 1721  Diet NPO time specified  Diet effective now        09/15/23 1720            Disposition Plan:      To home once workup is complete and patient is stable   Following barriers for discharge:                                                      Electrolytes corrected                               Anemia corrected h/H stable                             Pain controlled with PO medications                               Afebrile, white count improving able to transition to PO antibiotics                             Will need to be able to tolerate PO                            Will likely need home health, home O2, set up                           Will need consultants to evaluate patient prior to discharge                           Work of breathing improves       Consult Orders  (From admission, onward)           Start     Ordered   09/15/23 2248  Consult to hospitalist  Pg sent by deloris [facility] 22:58  Once       Provider:  (Not yet assigned)  Question  Answer Comment  Place call to: [facility]   Reason for Consult Admit      09/15/23 2247                              Swallow eval - SLP ordered                                    Nutrition    consulted                 Consults called: sent msg to [facility]   Admission status:  ED Disposition     ED Disposition  Admit   Condition  --   Comment  Hospital Area: [facility] [[id number]]  Level of Care: Progressive [102]  Admit to Progressive based on following criteria: CARDIOVASCULAR & THORACIC of moderate stability with acute coronary syndrome symptoms/low risk myocardial infarction/hypertensive urgency/arrhythmias/heart failure potentially compromising stability and stable post  cardiovascular intervention patients.  Admit to Progressive based on following criteria: GI, ENDOCRINE disease patients with GI bleeding, acute liver failure or pancreatitis, stable with diabetic ketoacidosis or thyrotoxicosis (hypothyroid) state.  May admit patient to [facility] or [facility] if equivalent level of care is available:: No  Covid Evaluation: Asymptomatic - no recent exposure (last 10 days) testing not required  Diagnosis: Dehydration [276.51.ICD-9-CM]  Admitting Physician: Cederic Mozley [[id number]]  Attending Physician: Randolf Sansoucie [[id number]]  Certification:: I certify this patient will need inpatient services for at least 2 midnights              inpatient     I Expect 2 midnight stay secondary to severity of patient's current illness need for inpatient interventions justified by the following:     Severe lab/radiological/exam abnormalities including:   Covid, electrolytes abnormalities and extensive comorbidities including:  DM2 PAD Morbid Obesity   That are currently affecting medical management.   I expect  patient to be hospitalized for 2 midnights requiring inpatient medical care.  Patient is at high risk for adverse outcome (such as loss of  life or disability) if not treated.  Indication for inpatient stay as follows:  inability to maintain oral hydration   Need for operative/procedural  intervention   Need for   IV fluids,  IV pain medications,      Level of care      progressive     Darlyne Schmiesing 09/16/2023, 1:27 AM    [facility]     after 2 AM please page floor coverage   If 7AM-7PM, please contact the day team taking care of the patient using [email or web address]

## 2023-09-15 NOTE — Assessment & Plan Note (Addendum)
 Supportive management No evidence of pulmonary infiltrate or hypoxia at this time

## 2023-09-15 NOTE — Assessment & Plan Note (Signed)
 Obtain anemia panel  Transfuse for Hg <7 , rapidly dropping or  if symptomatic

## 2023-09-15 NOTE — Assessment & Plan Note (Addendum)
 Gently rehydrate and continue to monitor Obtain urine electrolytes

## 2023-09-15 NOTE — ED Notes (Signed)
 Pt attempted to provide urine sample, no enough output for sample. Pt had one medium loose/watery bowel movement. RN notified.

## 2023-09-15 NOTE — ED Notes (Signed)
 CCMD contacted to place pt on cardiac monitoring

## 2023-09-15 NOTE — Assessment & Plan Note (Signed)
 Check CK would not start statins for now

## 2023-09-15 NOTE — Subjective & Objective (Signed)
  Hx of DM2 stopped her metformin  has had decreased PO intake lost taste  Lost weight 40 lb over past few months Has been hypoglycemic Has had prior admission for decreased p.o. intake  End of August CT scan showed diffuse pulmonary embolism in the right middle and right lower lobe Patient has known history of peripheral arterial disease for which she takes Pletal

## 2023-09-15 NOTE — ED Notes (Signed)
 Pt states oral glucose gel makes her feel sick, pt provided with cranberry juice and reminded to intake to maintain blood sugar.

## 2023-09-15 NOTE — Assessment & Plan Note (Signed)
 Likely secondary to long-term poor p.o. intake follow serial CBG and correct

## 2023-09-15 NOTE — Assessment & Plan Note (Signed)
 Check hemoglobin A1c patient has been persistently hypoglycemic.  Avoid insulin  for now continue to hold metformin 

## 2023-09-15 NOTE — ED Notes (Signed)
 Pt unable to urinate at this time.

## 2023-09-15 NOTE — Assessment & Plan Note (Signed)
 Rehydrate and follow fluid status check orthostatics prior to discharge

## 2023-09-15 NOTE — Assessment & Plan Note (Addendum)
  CT abdomen with contrast but not a CTA did not show Any significant evidence of ischemia  Pt was told that it is hard to find her pulse Order ABI of lower ext

## 2023-09-15 NOTE — ED Triage Notes (Signed)
 Pt arrives via EMS from home. Pt c/o ongoing abdominal pain, nausea, vomiting, and diarrhea. Pt arrives AxOx4. Pt reports recently being seen and treated for multiple blood clots in her lungs. She states she has been taking her eliquis  as prescribed. She is AxOx4. Denies Chest pain. States she has had mild sob since being diagnosed with the PEs

## 2023-09-15 NOTE — ED Provider Triage Note (Signed)
 Emergency Medicine Provider Triage Evaluation Note  [name] , a 68 y.o. female  was evaluated in triage.  Pt complains of NVD and abdominal pain. Recently diagnosed with PE and complaint on blood thinners. Has SOB which has been chronic since PE diagnosis and patient thinks this is d/t her generalized weakness. States that it feels like the food is getting stuck in her throat before she vomits it back up. Also endorses generalized abdominal pain that is worse in RLQ. Also states that these symptoms have been happening for 1 month and stating that she has lost a bunch of weight. Also stating that she has lost taste.   Review of Systems  Positive:  Negative:   Physical Exam  BP 131/67 (BP Location: Left Arm)   Pulse 84   Temp 99.2 F (37.3 C) (Oral)   Resp 18   SpO2 100%  Gen:   Awake, no distress   Resp:  Normal effort  MSK:   Moves extremities without difficulty  Other:    Medical Decision Making  Medically screening exam initiated at 5:57 PM.  Appropriate orders placed.  Angie Carr was informed that the remainder of the evaluation will be completed by another provider, this initial triage assessment does not replace that evaluation, and the importance of remaining in the ED until their evaluation is complete.     Angie Carr, [place] 09/15/23 2342

## 2023-09-16 ENCOUNTER — Inpatient Hospital Stay (HOSPITAL_COMMUNITY)

## 2023-09-16 DIAGNOSIS — I7090 Unspecified atherosclerosis: Secondary | ICD-10-CM | POA: Diagnosis not present

## 2023-09-16 DIAGNOSIS — E860 Dehydration: Secondary | ICD-10-CM | POA: Diagnosis not present

## 2023-09-16 LAB — IRON AND TIBC
Iron: 56 ug/dL (ref 28–170)
Saturation Ratios: 28 % (ref 10.4–31.8)
TIBC: 197 ug/dL — ABNORMAL LOW (ref 250–450)
UIBC: 141 ug/dL

## 2023-09-16 LAB — VITAMIN B12: Vitamin B-12: 287 pg/mL (ref 180–914)

## 2023-09-16 LAB — I-STAT VENOUS BLOOD GAS, ED
Acid-base deficit: 4 mmol/L — ABNORMAL HIGH (ref 0.0–2.0)
Bicarbonate: 19.3 mmol/L — ABNORMAL LOW (ref 20.0–28.0)
Calcium, Ion: 1.06 mmol/L — ABNORMAL LOW (ref 1.15–1.40)
HCT: 32 % — ABNORMAL LOW (ref 36.0–46.0)
Hemoglobin: 10.9 g/dL — ABNORMAL LOW (ref 12.0–15.0)
O2 Saturation: 92 %
Potassium: 3 mmol/L — ABNORMAL LOW (ref 3.5–5.1)
Sodium: 137 mmol/L (ref 135–145)
TCO2: 20 mmol/L — ABNORMAL LOW (ref 22–32)
pCO2, Ven: 27.7 mmHg — ABNORMAL LOW (ref 44–60)
pH, Ven: 7.45 — ABNORMAL HIGH (ref 7.25–7.43)
pO2, Ven: 60 mmHg — ABNORMAL HIGH (ref 32–45)

## 2023-09-16 LAB — CBC
HCT: 31.6 % — ABNORMAL LOW (ref 36.0–46.0)
Hemoglobin: 10.6 g/dL — ABNORMAL LOW (ref 12.0–15.0)
MCH: 30.8 pg (ref 26.0–34.0)
MCHC: 33.5 g/dL (ref 30.0–36.0)
MCV: 91.9 fL (ref 80.0–100.0)
Platelets: 189 K/uL (ref 150–400)
RBC: 3.44 MIL/uL — ABNORMAL LOW (ref 3.87–5.11)
RDW: 16.2 % — ABNORMAL HIGH (ref 11.5–15.5)
WBC: 5.3 K/uL (ref 4.0–10.5)
nRBC: 0 % (ref 0.0–0.2)

## 2023-09-16 LAB — CBG MONITORING, ED
Glucose-Capillary: 75 mg/dL (ref 70–99)
Glucose-Capillary: 70 mg/dL (ref 70–99)
Glucose-Capillary: 73 mg/dL (ref 70–99)
Glucose-Capillary: 68 mg/dL — ABNORMAL LOW (ref 70–99)
Glucose-Capillary: 66 mg/dL — ABNORMAL LOW (ref 70–99)
Glucose-Capillary: 65 mg/dL — ABNORMAL LOW (ref 70–99)

## 2023-09-16 LAB — BASIC METABOLIC PANEL WITH GFR
Anion gap: 17 — ABNORMAL HIGH (ref 5–15)
BUN: 17 mg/dL (ref 8–23)
CO2: 19 mmol/L — ABNORMAL LOW (ref 22–32)
Calcium: 7.8 mg/dL — ABNORMAL LOW (ref 8.9–10.3)
Chloride: 100 mmol/L (ref 98–111)
Creatinine, Ser: 0.92 mg/dL (ref 0.44–1.00)
GFR, Estimated: >60 mL/min (ref >60)
Glucose, Bld: 70 mg/dL (ref 70–99)
Potassium: 3.2 mmol/L — ABNORMAL LOW (ref 3.5–5.1)
Sodium: 136 mmol/L (ref 135–145)

## 2023-09-16 LAB — CK: Total CK: 40 U/L (ref 38–234)

## 2023-09-16 LAB — CBC WITH DIFFERENTIAL/PLATELET
Abs Immature Granulocytes: 0.01 K/uL (ref 0.00–0.07)
Basophils Absolute: 0 K/uL (ref 0.0–0.1)
Basophils Relative: 1 %
Eosinophils Absolute: 0 K/uL (ref 0.0–0.5)
Eosinophils Relative: 0 %
HCT: 32.8 % — ABNORMAL LOW (ref 36.0–46.0)
Hemoglobin: 10.6 g/dL — ABNORMAL LOW (ref 12.0–15.0)
Immature Granulocytes: 0 %
Lymphocytes Relative: 43 %
Lymphs Abs: 1.5 K/uL (ref 0.7–4.0)
MCH: 30.5 pg (ref 26.0–34.0)
MCHC: 32.3 g/dL (ref 30.0–36.0)
MCV: 94.3 fL (ref 80.0–100.0)
Monocytes Absolute: 0.4 K/uL (ref 0.1–1.0)
Monocytes Relative: 13 %
Neutro Abs: 1.4 K/uL — ABNORMAL LOW (ref 1.7–7.7)
Neutrophils Relative %: 43 %
Platelets: 181 K/uL (ref 150–400)
RBC: 3.48 MIL/uL — ABNORMAL LOW (ref 3.87–5.11)
RDW: 16.5 % — ABNORMAL HIGH (ref 11.5–15.5)
WBC: 3.3 K/uL — ABNORMAL LOW (ref 4.0–10.5)
nRBC: 0 % (ref 0.0–0.2)

## 2023-09-16 LAB — LACTIC ACID, PLASMA: Lactic Acid, Venous: 1.1 mmol/L (ref 0.5–1.9)

## 2023-09-16 LAB — COMPREHENSIVE METABOLIC PANEL WITH GFR
ALT: 15 U/L (ref 0–44)
AST: 19 U/L (ref 15–41)
Albumin: 2.7 g/dL — ABNORMAL LOW (ref 3.5–5.0)
Alkaline Phosphatase: 56 U/L (ref 38–126)
Anion gap: 10 (ref 5–15)
BUN: 14 mg/dL (ref 8–23)
CO2: 26 mmol/L (ref 22–32)
Calcium: 9.1 mg/dL (ref 8.9–10.3)
Chloride: 100 mmol/L (ref 98–111)
Creatinine, Ser: 0.88 mg/dL (ref 0.44–1.00)
GFR, Estimated: >60 mL/min (ref >60)
Glucose, Bld: 84 mg/dL (ref 70–99)
Potassium: 3.3 mmol/L — ABNORMAL LOW (ref 3.5–5.1)
Sodium: 136 mmol/L (ref 135–145)
Total Bilirubin: 1.4 mg/dL — ABNORMAL HIGH (ref 0.0–1.2)
Total Protein: 5.7 g/dL — ABNORMAL LOW (ref 6.5–8.1)

## 2023-09-16 LAB — VAS US ABI WITH/WO TBI
Left ABI: 0.7
Right ABI: 1

## 2023-09-16 LAB — FOLATE: Folate: 18.2 ng/mL (ref >5.9)

## 2023-09-16 LAB — PROCALCITONIN: Procalcitonin: <0.1 ng/mL

## 2023-09-16 LAB — GLUCOSE, CAPILLARY
Glucose-Capillary: 89 mg/dL (ref 70–99)
Glucose-Capillary: 83 mg/dL (ref 70–99)
Glucose-Capillary: 70 mg/dL (ref 70–99)
Glucose-Capillary: 109 mg/dL — ABNORMAL HIGH (ref 70–99)
Glucose-Capillary: 82 mg/dL (ref 70–99)

## 2023-09-16 LAB — HIV ANTIBODY (ROUTINE TESTING W REFLEX): HIV Screen 4th Generation wRfx: NONREACTIVE

## 2023-09-16 LAB — PHOSPHORUS
Phosphorus: 2 mg/dL — ABNORMAL LOW (ref 2.5–4.6)
Phosphorus: 2.3 mg/dL — ABNORMAL LOW (ref 2.5–4.6)

## 2023-09-16 LAB — OSMOLALITY: Osmolality: 293 mosm/kg (ref 275–295)

## 2023-09-16 LAB — FERRITIN: Ferritin: 119 ng/mL (ref 11–307)

## 2023-09-16 LAB — PREALBUMIN: Prealbumin: 16 mg/dL — ABNORMAL LOW (ref 18–38)

## 2023-09-16 LAB — MAGNESIUM: Magnesium: 1.4 mg/dL — ABNORMAL LOW (ref 1.7–2.4)

## 2023-09-16 LAB — T4, FREE: Free T4: 1.01 ng/dL (ref 0.61–1.12)

## 2023-09-16 LAB — BETA-HYDROXYBUTYRIC ACID: Beta-Hydroxybutyric Acid: 4.78 mmol/L — ABNORMAL HIGH (ref 0.05–0.27)

## 2023-09-16 MED ORDER — GABAPENTIN 400 MG PO CAPS
800.0000 mg | ORAL_CAPSULE | Freq: Every day | ORAL | Status: DC
  Administered 2023-09-16 – 2023-09-18 (×3): 800 mg via ORAL
  Filled 2023-09-16 (×3): qty 2

## 2023-09-16 MED ORDER — PANTOPRAZOLE SODIUM 40 MG IV SOLR
40.0000 mg | Freq: Two times a day (BID) | INTRAVENOUS | Status: DC
Start: 2023-09-16 — End: 2023-09-19
  Administered 2023-09-16 – 2023-09-19 (×7): 40 mg via INTRAVENOUS
  Filled 2023-09-16 (×7): qty 10

## 2023-09-16 MED ORDER — GABAPENTIN 400 MG PO CAPS: 400.0000 mg | ORAL_CAPSULE | ORAL | Status: DC

## 2023-09-16 MED ORDER — GABAPENTIN 400 MG PO CAPS
400.0000 mg | ORAL_CAPSULE | Freq: Every day | ORAL | Status: DC
  Administered 2023-09-16 – 2023-09-19 (×4): 400 mg via ORAL
  Filled 2023-09-16 (×4): qty 1

## 2023-09-16 MED ORDER — NEBIVOLOL HCL 5 MG PO TABS
5.0000 mg | ORAL_TABLET | Freq: Every day | ORAL | Status: DC
  Administered 2023-09-16 – 2023-09-19 (×3): 5 mg via ORAL
  Filled 2023-09-16 (×5): qty 1

## 2023-09-16 MED ORDER — THIAMINE HCL 100 MG/ML IJ SOLN
100.0000 mg | INTRAMUSCULAR | Status: DC
  Administered 2023-09-16 – 2023-09-19 (×4): 100 mg via INTRAVENOUS
  Filled 2023-09-16 (×4): qty 2

## 2023-09-16 MED ORDER — FENTANYL CITRATE PF 50 MCG/ML IJ SOSY: 12.5000 ug | PREFILLED_SYRINGE | INTRAMUSCULAR | Status: DC | PRN

## 2023-09-16 MED ORDER — ACETAMINOPHEN 325 MG PO TABS: 650.0000 mg | ORAL_TABLET | Freq: Four times a day (QID) | ORAL | Status: DC | PRN

## 2023-09-16 MED ORDER — HYDRALAZINE HCL 20 MG/ML IJ SOLN: 10.0000 mg | INTRAMUSCULAR | Status: DC | PRN

## 2023-09-16 MED ORDER — POTASSIUM CHLORIDE IN NACL 40-0.9 MEQ/L-% IV SOLN
INTRAVENOUS | Status: DC
  Filled 2023-09-16 (×2): qty 1000

## 2023-09-16 MED ORDER — CILOSTAZOL 50 MG PO TABS
50.0000 mg | ORAL_TABLET | Freq: Two times a day (BID) | ORAL | Status: DC
Start: 2023-09-16 — End: 2023-09-19
  Administered 2023-09-16 – 2023-09-19 (×7): 50 mg via ORAL
  Filled 2023-09-16 (×8): qty 1

## 2023-09-16 MED ORDER — ONDANSETRON HCL 4 MG/2ML IJ SOLN: 4.0000 mg | Freq: Four times a day (QID) | INTRAMUSCULAR | Status: DC | PRN

## 2023-09-16 MED ORDER — DEXTROSE-SODIUM CHLORIDE 5-0.45 % IV SOLN: INTRAVENOUS | Status: DC

## 2023-09-16 MED ORDER — DEXTROSE 50 % IV SOLN: 1.0000 | INTRAVENOUS | Status: DC | PRN

## 2023-09-16 MED ORDER — PANTOPRAZOLE SODIUM 40 MG PO TBEC: 40.0000 mg | DELAYED_RELEASE_TABLET | Freq: Every day | ORAL | Status: DC

## 2023-09-16 MED ORDER — ACETAMINOPHEN 650 MG RE SUPP: 650.0000 mg | Freq: Four times a day (QID) | RECTAL | Status: DC | PRN

## 2023-09-16 MED ORDER — FESOTERODINE FUMARATE ER 4 MG PO TB24
4.0000 mg | ORAL_TABLET | Freq: Every day | ORAL | Status: DC
  Administered 2023-09-16 – 2023-09-19 (×4): 4 mg via ORAL
  Filled 2023-09-16 (×4): qty 1

## 2023-09-16 MED ORDER — PRAVASTATIN SODIUM 40 MG PO TABS
40.0000 mg | ORAL_TABLET | Freq: Every day | ORAL | Status: DC
  Administered 2023-09-16 – 2023-09-18 (×3): 40 mg via ORAL
  Filled 2023-09-16 (×3): qty 1

## 2023-09-16 MED ORDER — ONDANSETRON HCL 4 MG PO TABS: 4.0000 mg | ORAL_TABLET | Freq: Four times a day (QID) | ORAL | Status: DC | PRN

## 2023-09-16 MED ORDER — METOPROLOL TARTRATE 5 MG/5ML IV SOLN: 5.0000 mg | INTRAVENOUS | Status: DC | PRN

## 2023-09-16 MED ORDER — IPRATROPIUM-ALBUTEROL 0.5-2.5 (3) MG/3ML IN SOLN: 3.0000 mL | RESPIRATORY_TRACT | Status: DC | PRN

## 2023-09-16 MED ORDER — K PHOS MONO-SOD PHOS DI & MONO 155-852-130 MG PO TABS
500.0000 mg | ORAL_TABLET | Freq: Three times a day (TID) | ORAL | Status: AC
  Administered 2023-09-16 (×3): 500 mg via ORAL
  Filled 2023-09-16 (×5): qty 2

## 2023-09-16 MED ORDER — GLUCAGON HCL RDNA (DIAGNOSTIC) 1 MG IJ SOLR
1.0000 mg | INTRAMUSCULAR | Status: AC | PRN
  Administered 2023-09-16: 1 mg via INTRAVENOUS
  Filled 2023-09-16: qty 1

## 2023-09-16 MED ORDER — CALCIUM GLUCONATE-NACL 2-0.675 GM/100ML-% IV SOLN
2.0000 g | Freq: Once | INTRAVENOUS | Status: AC
  Administered 2023-09-16: 2000 mg via INTRAVENOUS
  Filled 2023-09-16: qty 100

## 2023-09-16 MED ORDER — MIRTAZAPINE 15 MG PO TABS
7.5000 mg | ORAL_TABLET | Freq: Every day | ORAL | Status: DC
  Administered 2023-09-16 – 2023-09-19 (×4): 7.5 mg via ORAL
  Filled 2023-09-16 (×4): qty 1

## 2023-09-16 MED ORDER — APIXABAN 5 MG PO TABS
5.0000 mg | ORAL_TABLET | Freq: Two times a day (BID) | ORAL | Status: DC
  Administered 2023-09-16 – 2023-09-19 (×7): 5 mg via ORAL
  Filled 2023-09-16 (×7): qty 1

## 2023-09-16 NOTE — ED Notes (Signed)
Patient transported to Vascular 

## 2023-09-16 NOTE — Assessment & Plan Note (Signed)
-   Spoke about importance of quitting spent 5 minutes discussing options for treatment, prior attempts at quitting, and dangers of smoking  -At this point patient is  interested in quitting  - declined nicotine patch   - nursing tobacco cessation protocol

## 2023-09-16 NOTE — Hospital Course (Addendum)
 Brief Narrative:   68 year old with history of DM2, HTN, PVD PE on Eliquis , asthma, HTN, neuromuscular disorder comes to the hospital with nausea, vomiting, weight loss.  She was noted to have COVID-19 infection with multiple electrolyte normalities and dehydration.  Assessment & Plan:   Nausea/vomiting and weight loss COVID-19 infection - CT abdomen pelvis shows possible small degree of enteritis.  Nausea vomiting could be secondary to COVID-19 infection.  Continue supportive care at this time.  Gentle hydration as necessary.  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 intake and weight loss.  Currently diet controlled  Hyperlipidemia - Statin  Peripheral vascular disease - Moderate disease of left lower extremity.  No evidence of severe claudication at this time.  Outpatient follow-up  Congestive heart failure with preserved EF, 60% - Recent echocardiogram showed preserved EF, grade 1 DD  DVT prophylaxis: Eliquis     Code Status: Full Code Family Communication:   Status is: Inpatient Remains inpatient appropriate because: Continue hospital stay for multiple medical issues as mentioned above   PT Follow up Recs:   Subjective: Seen in the ER, tells me she feels better since coming in RN at bedside   Examination:  General exam: Appears calm and comfortable  Respiratory system: Clear to auscultation. Respiratory effort normal. Cardiovascular system: S1 & S2 heard, RRR. No JVD, murmurs, rubs, gallops or clicks. No pedal edema. Gastrointestinal system: Abdomen is nondistended, soft and nontender. No organomegaly or masses felt. Normal bowel sounds heard. Central nervous  system: Alert and oriented. No focal neurological deficits. Extremities: Symmetric 5 x 5 power. Skin: No rashes, lesions or ulcers Psychiatry: Judgement and insight appear normal. Mood & affect appropriate.

## 2023-09-16 NOTE — ED Notes (Signed)
 Pt CBG found to be 67, Amin MD contact via secure chat. Diet order obtained, tray ordered. Pt given 4 oz of juice while waiting for food, pt drank approximately 2oz not caring for the juice.

## 2023-09-16 NOTE — Care Management Obs Status (Signed)
 MEDICARE OBSERVATION STATUS NOTIFICATION   Patient Details  Name: Angie Carr MRN: [id number] Date of Birth: Nov 24, 1955   Medicare Observation Status Notification Given:  Yes    Corean JAYSON Canary, RN 09/16/2023, 12:36 PM

## 2023-09-16 NOTE — Progress Notes (Signed)
 SLP Cancellation Note  Patient Details Name: Angie Carr MRN: [id number] DOB: [birth year]   Cancelled treatment:       Reason Eval/Treat Not Completed: Discussed with MD, who states order was initially placed over concern for reduced intake PTA. Skilled SLP intervention is not expected to rehabilitate difficulty with intake and RN denies overt concerns related to swallowing after regular diet was ordered. Will sign off at this time but please re-consult if acute needs arise.    Damien Blumenthal, M.A., CCC-SLP Speech Language Pathology, Acute Rehabilitation Services  Secure Chat preferred [phone]  09/16/2023, 2:15 PM

## 2023-09-16 NOTE — Progress Notes (Signed)
 PROGRESS NOTE    Angie Carr  FMW:[id number] DOB: 12-04-1955 DOA: 09/15/2023 PCP: Benjamine Aland, MD    Brief Narrative:   68 year old with history of DM2, HTN, PVD PE on Eliquis , asthma, HTN, neuromuscular disorder comes to the hospital with nausea, vomiting, weight loss.  She was noted to have COVID-19 infection with multiple electrolyte normalities and dehydration.  Assessment & Plan:   Nausea/vomiting and weight loss COVID-19 infection - CT abdomen pelvis shows possible small degree of enteritis.  Nausea vomiting could be secondary to COVID-19 infection.  Continue supportive care at this time.  Gentle hydration as necessary.  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 intake and weight loss.  Currently diet controlled  Hyperlipidemia - Statin  Peripheral vascular disease - Moderate disease of left lower extremity.  No evidence of severe claudication at this time.  Outpatient follow-up  Congestive heart failure with preserved EF, 60% - Recent echocardiogram showed preserved EF, grade 1 DD  DVT prophylaxis: Eliquis     Code Status: Full Code Family Communication:   Status is: Inpatient Remains inpatient appropriate because: Continue hospital stay for multiple medical issues as mentioned above   PT Follow up Recs:   Subjective: Seen in the ER, tells me she feels better since coming in RN at bedside   Examination:  General exam: Appears calm and comfortable  Respiratory system: Clear to auscultation. Respiratory effort normal. Cardiovascular system: S1 & S2 heard, RRR. No JVD, murmurs, rubs, gallops or clicks. No pedal edema. Gastrointestinal system:  Abdomen is nondistended, soft and nontender. No organomegaly or masses felt. Normal bowel sounds heard. Central nervous system: Alert and oriented. No focal neurological deficits. Extremities: Symmetric 5 x 5 power. Skin: No rashes, lesions or ulcers Psychiatry: Judgement and insight appear normal. Mood & affect appropriate.                Diet Orders (From admission, onward)     Start     Ordered   09/16/23 0851  Diet NPO time specified Except for: Sips with Meds  Diet effective now       Question:  Except for  Answer:  Sips with Meds   09/16/23 0851            Objective: Vitals:   09/16/23 0815 09/16/23 0845 09/16/23 1155 09/16/23 1221  BP: 101/66 103/61  113/67  Pulse: 61 71 68 68  Resp: 14 (!) 8 13 14   Temp:   98.7 F (37.1 C)   TempSrc:   Oral   SpO2: 100% 100% 100% 99%   No intake or output data in the 24 hours ending 09/16/23 1222 There were no vitals filed for this visit.  Scheduled Meds:  apixaban   5 mg Oral BID   cilostazol   50 mg Oral BID   fesoterodine   4 mg Oral Daily   gabapentin   400 mg Oral Daily   gabapentin   800 mg Oral QHS   mirtazapine   7.5 mg Oral Daily   nebivolol   5 mg Oral Daily   pantoprazole  (PROTONIX ) IV  40 mg Intravenous Q12H   phosphorus  500 mg Oral TID   pravastatin   40 mg Oral Q1500   thiamine  (VITAMIN B1) injection  100 mg Intravenous Q24H   Continuous Infusions:  0.9 %  NaCl with KCl 40 mEq / L 50 mL/hr at 09/16/23 1216   calcium  gluconate 2,000 mg (09/16/23 1153)    Nutritional status     There is no height or weight on file to calculate BMI.  Data Reviewed:   CBC: Recent Labs  Lab 09/15/23 1721 09/16/23 0258 09/16/23 0420  WBC 3.2* 3.3*  --   NEUTROABS  --  1.4*  --   HGB 12.7 10.6* 10.9*  HCT 39.7 32.8* 32.0*  MCV 96.6 94.3  --   PLT 225 181  --    Basic Metabolic Panel: Recent Labs  Lab 09/15/23 1721 09/15/23 1941 09/16/23 0258 09/16/23 0420  NA 132*  --  136 137  K 3.6  --  3.2* 3.0*  CL  93*  --  100  --   CO2 16*  --  19*  --   GLUCOSE 65*  --  70  --   BUN 20  --  17  --   CREATININE 1.17*  --  0.92  --   CALCIUM  8.6*  --  7.8*  --   MG  --  1.0*  --   --   PHOS  --   --  2.0*  --    GFR: CrCl cannot be calculated (Unknown ideal weight.). Liver Function Tests: Recent Labs  Lab 09/15/23 1721  AST 22  ALT 18  ALKPHOS 75  BILITOT 1.9*  PROT 7.4  ALBUMIN 3.6   Recent Labs  Lab 09/15/23 1721  LIPASE 26   No results for input(s): AMMONIA in the last 168 hours. Coagulation Profile: No results for input(s): INR, PROTIME in the last 168 hours. Cardiac Enzymes: Recent Labs  Lab 09/16/23 0258  CKTOTAL 40   BNP (last 3 results) No results for input(s): PROBNP in the last 8760 hours. HbA1C: No results for input(s): HGBA1C in the last 72 hours. CBG: Recent Labs  Lab 09/16/23 0414 09/16/23 0649 09/16/23 0755 09/16/23 0959 09/16/23 1105  GLUCAP 70 73 68* 66* 65*   Lipid Profile: No results for input(s): CHOL, HDL, LDLCALC, TRIG, CHOLHDL, LDLDIRECT in the last 72 hours. Thyroid Function Tests: Recent Labs    09/15/23 1721 09/16/23 0258  TSH 2.688  --   FREET4  --  1.01   Anemia Panel: Recent Labs    09/15/23 1721 09/16/23 0258  VITAMINB12  --  287  FOLATE  --  18.2  FERRITIN  --  119  TIBC  --  197*  IRON  --  56  RETICCTPCT 1.7  --    Sepsis Labs: Recent Labs  Lab 09/16/23 0258  PROCALCITON <0.10  LATICACIDVEN 1.1    Recent Results (from the past 240 hours)  Resp panel by RT-PCR (RSV, Flu A&B, Covid) Anterior Nasal Swab     Status: Abnormal   Collection Time: 09/15/23  6:03 PM   Specimen: Anterior Nasal Swab  Result Value Ref Range Status   SARS Coronavirus 2 by RT PCR POSITIVE (A) NEGATIVE Final   Influenza A by PCR NEGATIVE NEGATIVE Final   Influenza B by PCR NEGATIVE NEGATIVE Final    Comment: (NOTE) The Xpert Xpress SARS-CoV-2/FLU/RSV plus assay is intended as an aid in the diagnosis of influenza  from Nasopharyngeal swab specimens and should not be used as a sole basis for treatment. Nasal washings and aspirates are unacceptable for Xpert Xpress SARS-CoV-2/FLU/RSV testing.  Fact Sheet for Patients: BloggerCourse.com  Fact Sheet for Healthcare Providers: SeriousBroker.it  This test is not yet  approved or cleared by the [place]  FDA and has been authorized for detection and/or diagnosis of SARS-CoV-2 by FDA under an Emergency Use Authorization (EUA). This EUA will remain in effect (meaning this test can be used) for the duration of the COVID-19 declaration under Section 564(b)(1) of the Act, 21 U.S.C. section 360bbb-3(b)(1), unless the authorization is terminated or revoked.     Resp Syncytial Virus by PCR NEGATIVE NEGATIVE Final    Comment: (NOTE) Fact Sheet for Patients: BloggerCourse.com  Fact Sheet for Healthcare Providers: SeriousBroker.it  This test is not yet approved or cleared by the [place]  FDA and has been authorized for detection and/or diagnosis of SARS-CoV-2 by FDA under an Emergency Use Authorization (EUA). This EUA will remain in effect (meaning this test can be used) for the duration of the COVID-19 declaration under Section 564(b)(1) of the Act, 21 U.S.C. section 360bbb-3(b)(1), unless the authorization is terminated or revoked.  Performed at [facility], [address] [place], [place] [address]          Radiology Studies: VAS US  ABI WITH/WO TBI Result Date: 09/16/2023  LOWER EXTREMITY DOPPLER STUDY Patient Name:  Angie Carr  Date of Exam:   09/16/2023 Medical Rec #: [id number]           Accession #:    [id number] Date of Birth: March 11, 1955           Patient Gender: F Patient Age:   72 years Exam Location:  [facility] Procedure:      VAS US  ABI WITH/WO TBI Referring Phys: BLEASE DOUTOVA  --------------------------------------------------------------------------------  Indications: Peripheral artery disease. High Risk Factors: Hypertension, hyperlipidemia, Diabetes, current smoker.  Comparison Study: No prior exam. Performing Technologist: Edilia Elden Appl  Examination Guidelines: A complete evaluation includes at minimum, Doppler waveform signals and systolic blood pressure reading at the level of bilateral brachial, anterior tibial, and posterior tibial arteries, when vessel segments are accessible. Bilateral testing is considered an integral part of a complete examination. Photoelectric Plethysmograph (PPG) waveforms and toe systolic pressure readings are included as required and additional duplex testing as needed. Limited examinations for reoccurring indications may be performed as noted.  ABI Findings: +---------+------------------+-----+---------+--------+ Right    Rt Pressure (mmHg)IndexWaveform Comment  +---------+------------------+-----+---------+--------+ Brachial 131                    triphasic         +---------+------------------+-----+---------+--------+ PTA      125               0.95 triphasic         +---------+------------------+-----+---------+--------+ DP       131               1.00 triphasic         +---------+------------------+-----+---------+--------+ Great Toe108               0.82 Normal            +---------+------------------+-----+---------+--------+ +---------+------------------+-----+---------+-------+ Left     Lt Pressure (mmHg)IndexWaveform Comment +---------+------------------+-----+---------+-------+ Brachial 126                    triphasic        +---------+------------------+-----+---------+-------+ PTA      92                0.70 biphasic         +---------+------------------+-----+---------+-------+ DP       84  0.64 biphasic         +---------+------------------+-----+---------+-------+  Great Toe64                0.49 Normal           +---------+------------------+-----+---------+-------+ +-------+-----------+-----------+------------+------------+ ABI/TBIToday's ABIToday's TBIPrevious ABIPrevious TBI +-------+-----------+-----------+------------+------------+ Right  1          0.82                                +-------+-----------+-----------+------------+------------+ Left   0.70       0.49                                +-------+-----------+-----------+------------+------------+  Summary: Right: Resting right ankle-brachial index is within normal range. The right toe-brachial index is normal.  Left: Resting left ankle-brachial index indicates moderate left lower extremity arterial disease. The left toe-brachial index is abnormal.  *See table(s) above for measurements and observations.  Electronically signed by Debby Robertson on 09/16/2023 at 11:36:11 AM.    Final    DG CHEST PORT 1 VIEW Result Date: 09/16/2023 CLINICAL DATA:  Dehydration EXAM: PORTABLE CHEST 1 VIEW COMPARISON:  12/13/2014 FINDINGS: Cardiac shadows within normal limits. Aortic calcifications are noted. The lungs are well aerated bilaterally. No focal infiltrate or effusion is seen. No bony abnormality is noted. IMPRESSION: No acute abnormality noted. Electronically Signed   By: Oneil Devonshire M.D.   On: 09/16/2023 00:50   CT ABDOMEN PELVIS W CONTRAST Result Date: 09/15/2023 CLINICAL DATA:  Right lower quadrant pain EXAM: CT ABDOMEN AND PELVIS WITH CONTRAST TECHNIQUE: Multidetector CT imaging of the abdomen and pelvis was performed using the standard protocol following bolus administration of intravenous contrast. RADIATION DOSE REDUCTION: This exam was performed according to the departmental dose-optimization program which includes automated exposure control, adjustment of the mA and/or kV according to patient size and/or use of iterative reconstruction technique. CONTRAST:  75mL OMNIPAQUE  IOHEXOL  350  MG/ML SOLN COMPARISON:  08/28/2023 FINDINGS: Lower chest: Lung bases are free of acute infiltrate or sizable effusion. No incidental pulmonary emboli seen. Hepatobiliary: No focal liver abnormality is seen. Status post cholecystectomy. No biliary dilatation. Pancreas: Unremarkable. No pancreatic ductal dilatation or surrounding inflammatory changes. Spleen: Normal in size without focal abnormality. Adrenals/Urinary Tract: Adrenal glands are within normal limits. Kidneys demonstrate a normal enhancement pattern bilaterally. No renal calculi or obstructive changes are seen. The bladder is decompressed. Stomach/Bowel: Diverticular change of the colon is noted without evidence of diverticulitis. This is stable from the prior exam. Transverse colon again extends into a ventral hernia similar to that noted on the prior study. A small fat containing hernia is noted just below and to the right of this more superior ventral hernia. The proximal colon is within normal limits. The appendix is unremarkable. Small bowel and stomach are mildly prominent without true obstruction which may represent a degree of enteritis. Vascular/Lymphatic: Aortic atherosclerosis. No enlarged abdominal or pelvic lymph nodes. Reproductive: Status post hysterectomy. No adnexal masses. Other: No free fluid is noted. A fat containing periumbilical hernia is noted on the right similar to that seen on the prior exam. A small ventral hernia is noted just superior to that containing a portion of the transverse colon without obstructive change. The overall appearance is stable. Musculoskeletal: Left hip replacement is noted. Degenerative changes of the right hip joint and lumbar spine are noted. IMPRESSION: Mild  small bowel prominence centrally although no true obstructive changes are seen. This may represent a degree of enteritis. Diverticulosis without diverticulitis. Normal-appearing appendix. Stable hernias as described above. Electronically Signed    By: Oneil Devonshire M.D.   On: 09/15/2023 22:42           LOS: 1 day   Time spent= 35 mins    Burgess JAYSON Dare, MD [facility]  If 7PM-7AM, please contact night-coverage  09/16/2023, 12:22 PM

## 2023-09-16 NOTE — ED Notes (Signed)
Unable to obtain blood work at this time.

## 2023-09-16 NOTE — ED Notes (Signed)
 Pt otf to procedure.

## 2023-09-16 NOTE — Evaluation (Signed)
 Occupational Therapy Evaluation Patient Details Name: Angie Carr MRN: [id number] DOB: Jan 01, 1956 Today's Date: 09/16/2023   History of Present Illness   Angie Carr is a 68 y.o. female who presented with decreased p.o. intake nausea vomiting weight loss. Found to be COVID+. PMHx: DM2 , HTN, peripheral vascular disease history of PE currently on Eliquis , anxiety asthma hypertension neuromuscular disorder     Clinical Impressions Angie Carr was evaluated s/p the above admission list. She lives with family and is mod I with AD use at baseline, she does report increased weakness recently. Upon evaluation the pt was limited by generalized weakness, mild dizziness, unsteady balance and limited activity tolerance. Overall she needed min A for bed mobility and CGA for transfers and mobility with RW. Due to the deficits listed below the pt also needs up to min A for LB ADLs and set up A for UB ADLs. Pt will benefit from continued acute OT services and HHOT.      If plan is discharge home, recommend the following:   A little help with walking and/or transfers;A little help with bathing/dressing/bathroom;Assistance with cooking/housework;Direct supervision/assist for medications management;Direct supervision/assist for financial management;Assist for transportation;Help with stairs or ramp for entrance     Functional Status Assessment   Patient has had a recent decline in their functional status and demonstrates the ability to make significant improvements in function in a reasonable and predictable amount of time.       Precautions/Restrictions   Precautions Precautions: Fall Recall of Precautions/Restrictions: Intact     Mobility Bed Mobility Overal bed mobility: Needs Assistance Bed Mobility: Supine to Sit     Supine to sit: Min assist          Transfers Overall transfer level: Needs assistance Equipment used: Rolling walker (2 wheels) Transfers: Sit to/from  Stand Sit to Stand: Contact guard assist           General transfer comment: incr time and effort to stand      Balance Overall balance assessment: Needs assistance Sitting-balance support: Feet supported Sitting balance-Leahy Scale: Poor     Standing balance support: Bilateral upper extremity supported, During functional activity Standing balance-Leahy Scale: Poor                             ADL either performed or assessed with clinical judgement   ADL Overall ADL's : Needs assistance/impaired Eating/Feeding: Independent   Grooming: Set up;Sitting   Upper Body Bathing: Set up;Sitting   Lower Body Bathing: Minimal assistance;Sit to/from stand   Upper Body Dressing : Set up;Sitting   Lower Body Dressing: Minimal assistance;Sit to/from stand   Toilet Transfer: Contact guard assist;Ambulation;Rolling walker (2 wheels)   Toileting- Clothing Manipulation and Hygiene: Supervision/safety;Sitting/lateral lean       Functional mobility during ADLs: Contact guard assist;Rolling walker (2 wheels) General ADL Comments: increased time for energy conservation     Vision Baseline Vision/History: 1 Wears glasses Vision Assessment?: No apparent visual deficits     Perception Perception: Not tested       Praxis Praxis: Not tested       Pertinent Vitals/Pain Pain Assessment Pain Assessment: No/denies pain     Extremity/Trunk Assessment Upper Extremity Assessment Upper Extremity Assessment: Overall WFL for tasks assessed;Generalized weakness   Lower Extremity Assessment Lower Extremity Assessment: Defer to PT evaluation   Cervical / Trunk Assessment Cervical / Trunk Assessment: Normal   Communication Communication Communication: No apparent difficulties  Cognition Arousal: Alert Behavior During Therapy: WFL for tasks assessed/performed Cognition: No apparent impairments                               Following commands: Intact        Cueing  General Comments   Cueing Techniques: Verbal cues  VSS on RA           Home Living Family/patient expects to be discharged to:: Private residence Living Arrangements: Children;Other relatives Available Help at Discharge: Family;Available PRN/intermittently;Available 24 hours/day Type of Home: House Home Access: Ramped entrance     Home Layout: One level     Bathroom Shower/Tub: Producer, television/film/video: Standard     Home Equipment: Cane - single point;Rollator (4 wheels);Toilet riser   Additional Comments: son is indep at [facility] level, he cannot provide physical assist      Prior Functioning/Environment Prior Level of Function : Independent/Modified Independent             Mobility Comments: Ambulates with using rollator. Denies fall history - reports increased weakness recently ADLs Comments: Indep with ADLs. Manages her own medications. Pt relies on [employer] for transportation or family/friends.    OT Problem List: Decreased strength;Decreased range of motion;Impaired balance (sitting and/or standing);Decreased activity tolerance;Decreased safety awareness;Decreased knowledge of use of DME or AE;Decreased knowledge of precautions   OT Treatment/Interventions: Self-care/ADL training;Therapeutic exercise;DME and/or AE instruction;Therapeutic activities;Patient/family education;Balance training      OT Goals(Current goals can be found in the care plan section)   Acute Rehab OT Goals Patient Stated Goal: to feel better OT Goal Formulation: With patient Time For Goal Achievement: 09/30/23 Potential to Achieve Goals: Good ADL Goals Pt Will Perform Grooming: with modified independence Pt Will Perform Upper Body Dressing: with modified independence Pt Will Perform Lower Body Dressing: with modified independence;sit to/from stand Pt Will Transfer to Toilet: with modified independence;ambulating   OT Frequency:  Min 2X/week       AM-PAC OT 6  Clicks Daily Activity     Outcome Measure Help from another person eating meals?: None Help from another person taking care of personal grooming?: A Little Help from another person toileting, which includes using toliet, bedpan, or urinal?: A Little Help from another person bathing (including washing, rinsing, drying)?: A Little Help from another person to put on and taking off regular upper body clothing?: A Little Help from another person to put on and taking off regular lower body clothing?: A Little 6 Click Score: 19   End of Session Equipment Utilized During Treatment: Rolling walker (2 wheels);Gait belt Nurse Communication: Mobility status  Activity Tolerance: Patient tolerated treatment well Patient left: in chair;with call bell/phone within reach  OT Visit Diagnosis: Unsteadiness on feet (R26.81);Other abnormalities of gait and mobility (R26.89);Muscle weakness (generalized) (M62.81);Dizziness and giddiness (R42)                Time: 8673-8651 OT Time Calculation (min): 22 min Charges:  OT General Charges $OT Visit: 1 Visit OT Evaluation $OT Eval Moderate Complexity: 1 Mod  Lucie Kendall, OTR/L Acute Rehabilitation Services Office [phone] Secure Chat Communication Preferred   Lucie JONETTA Kendall 09/16/2023, 2:08 PM

## 2023-09-16 NOTE — ED Notes (Signed)
 A regular breakfast tray ordered.

## 2023-09-16 NOTE — Care Management CC44 (Signed)
 Condition Code 44 Documentation Completed  Patient Details  Name: Angie Carr MRN: [id number] Date of Birth: 1955-08-27   Condition Code 44 given:  Yes Patient signature on Condition Code 44 notice:  Yes Documentation of 2 MD's agreement:  Yes Code 44 added to claim:  Yes    Corean JAYSON Canary, RN 09/16/2023, 12:36 PM

## 2023-09-16 NOTE — Progress Notes (Signed)
 Pt received from ED, alert and oriented X4, VS stable, call bell within reach, bed alarm on

## 2023-09-16 NOTE — Progress Notes (Signed)
 ABI has been completed.  Results can be found in chart review under CV Proc.  09/16/2023 9:51 AM  Tache Bobst Elden Appl, RVT.

## 2023-09-16 NOTE — Evaluation (Signed)
 Physical Therapy Evaluation Patient Details Name: Angie Carr MRN: [id number] DOB: May 26, 1955 Today's Date: 09/16/2023  History of Present Illness  Angie Carr is a 68 y.o. female who presented 9/14 with decreased p.o. intake nausea vomiting weight loss. Found to be COVID+. PMHx: DM2 , HTN, peripheral vascular disease history of PE currently on Eliquis , anxiety asthma hypertension neuromuscular disorder  Clinical Impression  Pt admitted with above diagnosis. Reports recent Lt THA. Was supposed to start OPPT but developed illness and became progressively weak until admission. Pt able to ambulate up to 35 feet today with CGA. Moderately antalgic without AD. Educated on RW use and recommendation, symmetry and stability improves greatly. Pt agreeable to use. May benefit from HHPT follow-up due to deconditioning but should transition quickly to OPPT when she is safe to do so. Will need to work on Lt hip due to weakness and altered gait mechanics. SpO2 100% on RA while working with pt today. Pt currently with functional limitations due to the deficits listed below (see PT Problem List). Pt will benefit from acute skilled PT to increase their independence and safety with mobility to allow discharge.           If plan is discharge home, recommend the following: A little help with walking and/or transfers;A little help with bathing/dressing/bathroom;Assistance with cooking/housework;Assist for transportation;Help with stairs or ramp for entrance   Can travel by private vehicle        Equipment Recommendations None recommended by PT  Recommendations for Other Services       Functional Status Assessment Patient has had a recent decline in their functional status and demonstrates the ability to make significant improvements in function in a reasonable and predictable amount of time.     Precautions / Restrictions Precautions Precautions: Fall Recall of Precautions/Restrictions:  Intact Precaution/Restrictions Comments: No formal hip precautions per Op Note 07/24/23      Mobility  Bed Mobility               General bed mobility comments: in recliner    Transfers Overall transfer level: Needs assistance Equipment used: None Transfers: Sit to/from Stand Sit to Stand: Supervision           General transfer comment: Supervision for safety, limited WB through LLE with transition to stand but then able to fully bear weight once upright.    Ambulation/Gait Ambulation/Gait assistance: Contact guard assist Gait Distance (Feet): 35 Feet Assistive device: Rolling walker (2 wheels), None Gait Pattern/deviations: Step-through pattern, Decreased stride length, Decreased stance time - left, Antalgic Gait velocity: reduced Gait velocity interpretation: <1.31 ft/sec, indicative of household ambulator   General Gait Details: Initially declined RW but shows moderate antalgic pattern with decreased stance time on Lt. Educated on AD use with RW to support and greatly improved gait symmetry. Recommend continued use. SpO2 100% on RA before and immedidately after, mild dyspnea, pt reports very fatigued from short distance.  Stairs            Wheelchair Mobility     Tilt Bed    Modified Rankin (Stroke Patients Only)       Balance Overall balance assessment: Needs assistance Sitting-balance support: Feet supported, No upper extremity supported Sitting balance-Leahy Scale: Fair     Standing balance support: During functional activity, No upper extremity supported Standing balance-Leahy Scale: Fair  Pertinent Vitals/Pain Pain Assessment Pain Assessment: No/denies pain    Home Living Family/patient expects to be discharged to:: Private residence Living Arrangements: Children;Other relatives Available Help at Discharge: Family;Available PRN/intermittently;Available 24 hours/day Type of Home: House Home  Access: Ramped entrance       Home Layout: One level Home Equipment: Cane - single point;Rollator (4 wheels);Toilet riser Additional Comments: son is indep at [facility] level, he cannot provide physical assist    Prior Function Prior Level of Function : Independent/Modified Independent             Mobility Comments: Ambulates with using rollator. Denies fall history - reports increased weakness recently ADLs Comments: Indep with ADLs. Manages her own medications. Pt relies on [employer] for transportation or family/friends.     Extremity/Trunk Assessment   Upper Extremity Assessment Upper Extremity Assessment: Defer to OT evaluation    Lower Extremity Assessment Lower Extremity Assessment: Generalized weakness;LLE deficits/detail LLE Deficits / Details: Lt hip weakness, guarding, as expected post op from hip replacement recently.    Cervical / Trunk Assessment Cervical / Trunk Assessment: Normal  Communication   Communication Communication: No apparent difficulties    Cognition Arousal: Alert Behavior During Therapy: WFL for tasks assessed/performed   PT - Cognitive impairments: No apparent impairments                       PT - Cognition Comments: Pt A,Ox4 Following commands: Intact       Cueing Cueing Techniques: Verbal cues     General Comments General comments (skin integrity, edema, etc.): VSS RA, SpO2 100%    Exercises General Exercises - Lower Extremity Ankle Circles/Pumps: AROM, Both, 10 reps, Seated Long Arc Quad: Strengthening, Both, 10 reps, Seated   Assessment/Plan    PT Assessment Patient needs continued PT services  PT Problem List Cardiopulmonary status limiting activity;Decreased activity tolerance;Decreased mobility;Decreased strength;Decreased balance       PT Treatment Interventions DME instruction;Gait training;Functional mobility training;Therapeutic activities;Therapeutic exercise;Balance training;Patient/family  education;Neuromuscular re-education    PT Goals (Current goals can be found in the Care Plan section)  Acute Rehab PT Goals Patient Stated Goal: Return Home PT Goal Formulation: With patient Time For Goal Achievement: 09/30/23 Potential to Achieve Goals: Good    Frequency Min 2X/week     Co-evaluation               AM-PAC PT 6 Clicks Mobility  Outcome Measure Help needed turning from your back to your side while in a flat bed without using bedrails?: None Help needed moving from lying on your back to sitting on the side of a flat bed without using bedrails?: A Little Help needed moving to and from a bed to a chair (including a wheelchair)?: A Little Help needed standing up from a chair using your arms (e.g., wheelchair or bedside chair)?: A Little Help needed to walk in hospital room?: A Little Help needed climbing 3-5 steps with a railing? : A Little 6 Click Score: 19    End of Session Equipment Utilized During Treatment: Gait belt Activity Tolerance: Patient tolerated treatment well;Patient limited by fatigue Patient left: in chair;with call bell/phone within reach;with chair alarm set   PT Visit Diagnosis: Other abnormalities of gait and mobility (R26.89);Unsteadiness on feet (R26.81);Difficulty in walking, not elsewhere classified (R26.2)    Time: 8496-8483 PT Time Calculation (min) (ACUTE ONLY): 13 min   Charges:   PT Evaluation $PT Eval Low Complexity: 1 Low   PT General Charges $$  ACUTE PT VISIT: 1 Visit         Leontine Roads, PT, DPT [facility]  Rehabilitation Services Physical Therapist Office: [phone] Website: [email or web address]   Leontine GORMAN Roads 09/16/2023, 3:40 PM

## 2023-09-17 ENCOUNTER — Observation Stay (HOSPITAL_COMMUNITY)

## 2023-09-17 DIAGNOSIS — R630 Anorexia: Secondary | ICD-10-CM

## 2023-09-17 DIAGNOSIS — E860 Dehydration: Secondary | ICD-10-CM | POA: Diagnosis not present

## 2023-09-17 DIAGNOSIS — R432 Parageusia: Secondary | ICD-10-CM

## 2023-09-17 DIAGNOSIS — E46 Unspecified protein-calorie malnutrition: Secondary | ICD-10-CM | POA: Diagnosis not present

## 2023-09-17 DIAGNOSIS — R634 Abnormal weight loss: Secondary | ICD-10-CM

## 2023-09-17 LAB — BASIC METABOLIC PANEL WITH GFR
Anion gap: 18 — ABNORMAL HIGH (ref 5–15)
Anion gap: 13 (ref 5–15)
Anion gap: 14 (ref 5–15)
BUN: 10 mg/dL (ref 8–23)
BUN: 10 mg/dL (ref 8–23)
BUN: 9 mg/dL (ref 8–23)
CO2: 20 mmol/L — ABNORMAL LOW (ref 22–32)
CO2: 22 mmol/L (ref 22–32)
CO2: 20 mmol/L — ABNORMAL LOW (ref 22–32)
Calcium: 7.9 mg/dL — ABNORMAL LOW (ref 8.9–10.3)
Calcium: 8 mg/dL — ABNORMAL LOW (ref 8.9–10.3)
Calcium: 7.8 mg/dL — ABNORMAL LOW (ref 8.9–10.3)
Chloride: 103 mmol/L (ref 98–111)
Chloride: 105 mmol/L (ref 98–111)
Chloride: 105 mmol/L (ref 98–111)
Creatinine, Ser: 0.72 mg/dL (ref 0.44–1.00)
Creatinine, Ser: 0.75 mg/dL (ref 0.44–1.00)
Creatinine, Ser: 0.76 mg/dL (ref 0.44–1.00)
GFR, Estimated: >60 mL/min
GFR, Estimated: >60 mL/min (ref >60)
GFR, Estimated: >60 mL/min (ref >60)
Glucose, Bld: 76 mg/dL (ref 70–99)
Glucose, Bld: 83 mg/dL (ref 70–99)
Glucose, Bld: 127 mg/dL — ABNORMAL HIGH (ref 70–99)
Potassium: 2.9 mmol/L — ABNORMAL LOW (ref 3.5–5.1)
Potassium: 3.4 mmol/L — ABNORMAL LOW (ref 3.5–5.1)
Potassium: 3.6 mmol/L (ref 3.5–5.1)
Sodium: 141 mmol/L (ref 135–145)
Sodium: 140 mmol/L (ref 135–145)
Sodium: 139 mmol/L (ref 135–145)

## 2023-09-17 LAB — GLUCOSE, CAPILLARY
Glucose-Capillary: 86 mg/dL (ref 70–99)
Glucose-Capillary: 85 mg/dL (ref 70–99)
Glucose-Capillary: 77 mg/dL (ref 70–99)
Glucose-Capillary: 88 mg/dL (ref 70–99)
Glucose-Capillary: 113 mg/dL — ABNORMAL HIGH (ref 70–99)
Glucose-Capillary: 123 mg/dL — ABNORMAL HIGH (ref 70–99)

## 2023-09-17 LAB — MAGNESIUM
Magnesium: 1.2 mg/dL — ABNORMAL LOW (ref 1.7–2.4)
Magnesium: 2.1 mg/dL (ref 1.7–2.4)

## 2023-09-17 LAB — T3: T3, Total: 62 ng/dL — ABNORMAL LOW (ref 71–180)

## 2023-09-17 LAB — CBC
HCT: 28.9 % — ABNORMAL LOW (ref 36.0–46.0)
Hemoglobin: 9.9 g/dL — ABNORMAL LOW (ref 12.0–15.0)
MCH: 31.4 pg (ref 26.0–34.0)
MCHC: 34.3 g/dL (ref 30.0–36.0)
MCV: 91.7 fL (ref 80.0–100.0)
Platelets: 164 K/uL (ref 150–400)
RBC: 3.15 MIL/uL — ABNORMAL LOW (ref 3.87–5.11)
RDW: 16.3 % — ABNORMAL HIGH (ref 11.5–15.5)
WBC: 4.4 K/uL (ref 4.0–10.5)
nRBC: 0 % (ref 0.0–0.2)

## 2023-09-17 LAB — BLOOD GAS, VENOUS
Acid-Base Excess: 0.3 mmol/L (ref 0.0–2.0)
Acid-base deficit: 0.5 mmol/L (ref 0.0–2.0)
Bicarbonate: 26 mmol/L (ref 20.0–28.0)
Bicarbonate: 25.4 mmol/L (ref 20.0–28.0)
Drawn by: 1470
O2 Saturation: 54.1 %
O2 Saturation: 39.9 %
Patient temperature: 36.8
Patient temperature: 36.3
pCO2, Ven: 45 mmHg (ref 44–60)
pCO2, Ven: 45 mmHg (ref 44–60)
pH, Ven: 7.37 (ref 7.25–7.43)
pH, Ven: 7.36 (ref 7.25–7.43)
pO2, Ven: 33 mmHg (ref 32–45)
pO2, Ven: <31 mmHg — CL (ref 32–45)

## 2023-09-17 LAB — LACTIC ACID, PLASMA
Lactic Acid, Venous: 1.1 mmol/L (ref 0.5–1.9)
Lactic Acid, Venous: 3.8 mmol/L (ref 0.5–1.9)

## 2023-09-17 LAB — PHOSPHORUS: Phosphorus: 4.5 mg/dL (ref 2.5–4.6)

## 2023-09-17 MED ORDER — POTASSIUM CHLORIDE CRYS ER 20 MEQ PO TBCR
40.0000 meq | EXTENDED_RELEASE_TABLET | Freq: Two times a day (BID) | ORAL | Status: AC
  Administered 2023-09-17 (×2): 40 meq via ORAL
  Filled 2023-09-17 (×2): qty 2

## 2023-09-17 MED ORDER — SODIUM CHLORIDE 0.9 % IV BOLUS
500.0000 mL | Freq: Once | INTRAVENOUS | Status: AC
  Administered 2023-09-17: 500 mL via INTRAVENOUS

## 2023-09-17 MED ORDER — LACTATED RINGERS IV BOLUS
1000.0000 mL | Freq: Once | INTRAVENOUS | Status: DC
Start: 2023-09-17 — End: 2023-09-17

## 2023-09-17 MED ORDER — MAGNESIUM SULFATE 4 GM/100ML IV SOLN
4.0000 g | Freq: Once | INTRAVENOUS | Status: AC
  Administered 2023-09-17: 4 g via INTRAVENOUS
  Filled 2023-09-17 (×2): qty 100

## 2023-09-17 MED ORDER — SODIUM CHLORIDE 0.45 % IV SOLN
INTRAVENOUS | Status: DC
  Filled 2023-09-17: qty 75

## 2023-09-17 MED ORDER — LACTATED RINGERS IV BOLUS
1000.0000 mL | INTRAVENOUS | Status: AC
  Administered 2023-09-17: 1000 mL via INTRAVENOUS

## 2023-09-17 MED ORDER — MAGNESIUM OXIDE -MG SUPPLEMENT 400 (240 MG) MG PO TABS
800.0000 mg | ORAL_TABLET | Freq: Once | ORAL | Status: AC
  Administered 2023-09-17: 800 mg via ORAL
  Filled 2023-09-17: qty 2

## 2023-09-17 MED ORDER — POTASSIUM CHLORIDE IN NACL 40-0.9 MEQ/L-% IV SOLN
INTRAVENOUS | Status: DC
  Filled 2023-09-17 (×3): qty 1000

## 2023-09-17 MED ORDER — KATE FARMS STANDARD 1.4 PO LIQD
325.0000 mL | Freq: Every day | ORAL | Status: DC
  Administered 2023-09-17: 325 mL via ORAL
  Filled 2023-09-17 (×3): qty 325

## 2023-09-17 MED ORDER — POTASSIUM CHLORIDE CRYS ER 20 MEQ PO TBCR
40.0000 meq | EXTENDED_RELEASE_TABLET | Freq: Once | ORAL | Status: AC
  Administered 2023-09-17: 40 meq via ORAL
  Filled 2023-09-17: qty 2

## 2023-09-17 NOTE — Consult Note (Addendum)
 Consultation Note   Referring Provider:   [facility] PCP: Benjamine Aland, MD Primary Gastroenterologist: [facility]  Reason for Consultation:  Diminished appetite, ? Vomiting,  and weight loss DOA: 09/15/2023         Hospital Day: 3   ASSESSMENT    68 year old female with multiple medical problems admitted with diminished appetite , dysphagia (vrs nausea / vomiting),  severe weight loss ( reports 60 pounds since late July 2025) , electrolyte derangements , protein calorie malnutrition.  Upper GI symptoms are vague and difficult to sort out.  Patient tells me he she has not been able to tolerate any solids since late July .  Food does not taste like it should , she regurgitates it and also describes nausea and vomiting .CT AP negative for acute findings/malignancies.  Etiology of symptoms unclear at this point.  Does not seem to be secondary to any of her home medications.  Iin fact, mirtazapine  is on home med list and that should be stimulating her appetite (though she is not familiar with this medication and sounds like she may not be taking? )  ? Mild small bowel enteritis on CT scan  Seems unlikely to be cause of her GI symptoms /weight loss   COVID 19 infection  Hypokalemia  Pulmonary embolism, recently diagnosed following hip surgery On Eliquis   DM2  Morbid obesity  Hypertension  Chronic diastolic heart failure with preserved EF of 60%  Peripheral artery disease  See PMH for additional problems /medical history  Principal Problem:   Dehydration Active Problems:   Diabetes mellitus, type 2 (HCC)   Hyperlipidemia LDL goal <70   Tobacco abuse   Acute kidney injury (HCC)   Absolute anemia   Peripheral arterial disease (HCC)   Pulmonary emboli (HCC)   COVID-19 virus infection   Hypomagnesemia   Protein calorie malnutrition (HCC)   Uncontrolled type 2 diabetes mellitus with hypoglycemia, without long-term  current use of insulin  (HCC)   PLAN:   Will obtain barium swallow with tablet.   HPI   Brief history  Patient was hospitalized late August with new pulmonary embolism felt to be provoked by recent left hip surgery.  Discharge summary from 08/30/2023 was reviewed. She was started on Eliquis .  During that admission she was also noted to have diarrhea with a decreased appetite.  CT scan of the abdomen and pelvis was done revealing.  It looks like stool studies were not ordered or at least collected.   Interval history Patient returned to the ED 9/14 with nausea, poor appetite and weight loss.  Reports a 40-60 pound weight loss she was COVID-positive. In the ED she had AKI and electrolyte abnormalities.  She had repeat CT AP with contrast which was negative for acute findings other than possible enteritis.   Angie Carr tells me that since hip surgery the end of July she has had no appetite.  Food does not smell good nor taste good.  She has no appetite.  When she does try to eat, food  Swells in her mouth and she is usually unable to swallow it and has to spit it out.  She has been unable to swallow hardly anything solid since late July.  What  she has gotten down she generally regurgitates /vomits afterwards.  However, she does not clearly describe sensation of dysphagia.  She is on the PPI for history of GERD.  She has been having some loose stools but not diarrhea.  She has been on metformin  for years.  No blood in stool  WBC 9.9 Hgb 9.9 (down from 12.7 on admission).  Baseline hemoglobin 12-13 Ferritin 119, TIBC 197, iron saturation 28 percent  Pertinent GI Studies   2016 Screening colonoscopy  - [facility] ( no longer followed by [facility] ) Left-sided diverticulosis   Labs and Imaging:  Recent Labs    09/15/23 1721 09/16/23 1304  PROT 7.4 5.7*  ALBUMIN 3.6 2.7*  AST 22 19  ALT 18 15  ALKPHOS 75 56  BILITOT 1.9* 1.4*   Recent Labs    09/16/23 0258 09/16/23 0420  09/16/23 1304 09/17/23 0514  WBC 3.3*  --  5.3 4.4  HGB 10.6* 10.9* 10.6* 9.9*  HCT 32.8* 32.0* 31.6* 28.9*  MCV 94.3  --  91.9 91.7  PLT 181  --  189 164   Recent Labs    09/16/23 0258 09/16/23 0420 09/16/23 1304 09/17/23 0514  NA 136 137 136 141  K 3.2* 3.0* 3.3* 2.9*  CL 100  --  100 103  CO2 19*  --  26 20*  GLUCOSE 70  --  84 76  BUN 17  --  14 10  CREATININE 0.92  --  0.88 0.72  CALCIUM  7.8*  --  9.1 7.9*     VAS US  ABI WITH/WO TBI  LOWER EXTREMITY DOPPLER STUDY  Patient Name:  Angie Carr  Date of Exam:   09/16/2023 Medical Rec #: [id number]           Accession #:    [id number] Date of Birth: 1955/07/02           Patient Gender: F Patient Age:   38 years Exam Location:  [facility] Procedure:      VAS US  ABI WITH/WO TBI Referring Phys: ANASTASSIA DOUTOVA  --------------------------------------------------------------------------------   Indications: Peripheral artery disease.  High Risk Factors: Hypertension, hyperlipidemia, Diabetes, current smoker.   Comparison Study: No prior exam.  Performing Technologist: Edilia Elden Appl    Examination Guidelines: A complete evaluation includes at minimum, Doppler waveform signals and systolic blood pressure reading at the level of bilateral brachial, anterior tibial, and posterior tibial arteries, when vessel segments are accessible. Bilateral testing is considered an integral part of a complete examination. Photoelectric Plethysmograph (PPG) waveforms and toe systolic pressure readings are included as required and additional duplex testing as needed. Limited examinations for reoccurring indications may be performed as noted.    ABI Findings: +---------+------------------+-----+---------+--------+ Right    Rt Pressure (mmHg)IndexWaveform Comment  +---------+------------------+-----+---------+--------+ Brachial 131                    triphasic          +---------+------------------+-----+---------+--------+ PTA      125               0.95 triphasic         +---------+------------------+-----+---------+--------+ DP       131               1.00 triphasic         +---------+------------------+-----+---------+--------+ Great Toe108               0.82 Normal            +---------+------------------+-----+---------+--------+  +---------+------------------+-----+---------+-------+  Left     Lt Pressure (mmHg)IndexWaveform Comment +---------+------------------+-----+---------+-------+ Brachial 126                    triphasic        +---------+------------------+-----+---------+-------+ PTA      92                0.70 biphasic         +---------+------------------+-----+---------+-------+ DP       84                0.64 biphasic         +---------+------------------+-----+---------+-------+ Great Toe64                0.49 Normal           +---------+------------------+-----+---------+-------+  +-------+-----------+-----------+------------+------------+ ABI/TBIToday's ABIToday's TBIPrevious ABIPrevious TBI +-------+-----------+-----------+------------+------------+ Right  1          0.82                                +-------+-----------+-----------+------------+------------+ Left   0.70       0.49                                +-------+-----------+-----------+------------+------------+    Summary: Right: Resting right ankle-brachial index is within normal range. The right toe-brachial index is normal.   Left: Resting left ankle-brachial index indicates moderate left lower extremity arterial disease. The left toe-brachial index is abnormal.    *See table(s) above for measurements and observations.    Electronically signed by Debby Robertson on 09/16/2023 at 11:36:11 AM.      Final   DG CHEST PORT 1 VIEW CLINICAL DATA:  Dehydration  EXAM: PORTABLE CHEST 1  VIEW  COMPARISON:  12/13/2014  FINDINGS: Cardiac shadows within normal limits. Aortic calcifications are noted. The lungs are well aerated bilaterally. No focal infiltrate or effusion is seen. No bony abnormality is noted.  IMPRESSION: No acute abnormality noted.  Electronically Signed   By: Oneil Devonshire M.D.   On: 09/16/2023 00:50    Past Medical History:  Diagnosis Date   Anemia    Anxiety    Asthma    pt denies   Diabetes mellitus without complication (HCC)    DJD (degenerative joint disease)    lower back   GERD (gastroesophageal reflux disease)    Headache    miagraines, none in years   Hypertension    LBP (low back pain)    Morbid obesity (HCC)    Neuromuscular disorder (HCC)    feet tingle and her balance is sometimes a problem   Primary osteoarthritis of both knees    Smoker     Past Surgical History:  Procedure Laterality Date   ABDOMINAL HYSTERECTOMY     complete   CHOLECYSTECTOMY     laparascopic   COLONOSCOPY N/A 02/11/2014   Procedure: COLONOSCOPY;  Surgeon: Lamar Donnald GAILS, MD;  Location: [facility];  Service: Endoscopy;  Laterality: N/A;   HERNIA REPAIR     ventral on left side.   TOTAL HIP ARTHROPLASTY Left 07/24/2023   Procedure: ARTHROPLASTY, HIP, TOTAL,POSTERIOR APPROACH;  Surgeon: Edna Toribio LABOR, MD;  Location: [facility];  Service: Orthopedics;  Laterality: Left;   TUBAL LIGATION      Family History  Problem Relation Age of Onset   Hypertension Other    Stroke Other  Prior to Admission medications   Medication Sig Start Date End Date Taking? Authorizing Provider  apixaban  (ELIQUIS ) 5 MG TABS tablet Take 2 tablets (10 mg total) by mouth 2 (two) times daily for 7 days, THEN 1 tablet (5 mg total) 2 (two) times daily. 08/30/23 12/05/23 Yes Shahmehdi, Adriana LABOR, MD  cilostazol  (PLETAL ) 50 MG tablet Take 50 mg by mouth 2 (two) times daily.   Yes [provider]  fesoterodine  (TOVIAZ ) 4 MG TB24 tablet Take 4 mg by mouth daily.  06/28/23  Yes [provider]  gabapentin  (NEURONTIN ) 400 MG capsule Take 400 mg by mouth See admin instructions. Take 1 capsule (400 mg) by mouth in the morning & take 2 capsules (800 mg) by mouth in the evening.   Yes [provider]  metFORMIN  (GLUCOPHAGE ) 1000 MG tablet Take 1 tablet (1,000 mg total) by mouth 2 (two) times daily with a meal. 12/16/14  Yes Dennise Lavada POUR, MD  mirtazapine  (REMERON ) 7.5 MG tablet Take 7.5 mg by mouth daily.   Yes [provider]  nebivolol  (BYSTOLIC ) 5 MG tablet Take 5 mg by mouth daily.   Yes [provider]  pantoprazole  (PROTONIX ) 40 MG tablet Take 1 tablet (40 mg total) by mouth daily. 08/30/23 11/28/23 Yes ShahmehdiAdriana LABOR, MD  Potassium Chloride  ER 20 MEQ TBCR Take 20 mEq by mouth in the morning. 06/28/23  Yes [provider]  pravastatin  (PRAVACHOL ) 40 MG tablet Take 40 mg by mouth daily in the afternoon.   Yes [provider]  diphenoxylate-atropine (LOMOTIL) 2.5-0.025 MG tablet Take 1 tablet by mouth 4 (four) times daily as needed for diarrhea or loose stools. Patient not taking: Reported on 09/15/2023 08/22/23   [provider]    Current Facility-Administered Medications  Medication Dose Route Frequency Provider Last Rate Last Admin   0.9 % NaCl with KCl 40 mEq / L  infusion   Intravenous Continuous Amin, Ankit C, MD 75 mL/hr at 09/17/23 0804 New Bag at 09/17/23 0804   acetaminophen  (TYLENOL ) tablet 650 mg  650 mg Oral Q6H PRN Doutova, Anastassia, MD       Or   acetaminophen  (TYLENOL ) suppository 650 mg  650 mg Rectal Q6H PRN Doutova, Anastassia, MD       apixaban  (ELIQUIS ) tablet 5 mg  5 mg Oral BID Doutova, Anastassia, MD   5 mg at 09/16/23 2225   cilostazol  (PLETAL ) tablet 50 mg  50 mg Oral BID Amin, Ankit C, MD   50 mg at 09/16/23 2225   dextrose  50 % solution 50 mL  1 ampule Intravenous PRN Amin, Ankit C, MD       fentaNYL  (SUBLIMAZE ) injection 12.5-50 mcg  12.5-50 mcg Intravenous  Q2H PRN Doutova, Anastassia, MD       fesoterodine  (TOVIAZ ) tablet 4 mg  4 mg Oral Daily Amin, Ankit C, MD   4 mg at 09/16/23 1132   gabapentin  (NEURONTIN ) capsule 400 mg  400 mg Oral Daily Amin, Ankit C, MD   400 mg at 09/16/23 1133   gabapentin  (NEURONTIN ) capsule 800 mg  800 mg Oral QHS Amin, Ankit C, MD   800 mg at 09/16/23 2223   hydrALAZINE  (APRESOLINE ) injection 10 mg  10 mg Intravenous Q4H PRN Amin, Ankit C, MD       ipratropium-albuterol  (DUONEB) 0.5-2.5 (3) MG/3ML nebulizer solution 3 mL  3 mL Nebulization Q4H PRN Amin, Ankit C, MD       magnesium  sulfate IVPB 4 g 100 mL  4 g Intravenous Once Amin, Ankit C, MD       metoprolol  tartrate (LOPRESSOR ) injection 5 mg  5 mg Intravenous Q4H PRN Amin, Ankit C, MD       mirtazapine  (REMERON ) tablet 7.5 mg  7.5 mg Oral Daily Amin, Ankit C, MD   7.5 mg at 09/17/23 0745   nebivolol  (BYSTOLIC ) tablet 5 mg  5 mg Oral Daily Amin, Ankit C, MD   5 mg at 09/16/23 1410   ondansetron  (ZOFRAN ) tablet 4 mg  4 mg Oral Q6H PRN Doutova, Anastassia, MD       Or   ondansetron  (ZOFRAN ) injection 4 mg  4 mg Intravenous Q6H PRN Doutova, Anastassia, MD       pantoprazole  (PROTONIX ) injection 40 mg  40 mg Intravenous Q12H Doutova, Anastassia, MD   40 mg at 09/16/23 2227   potassium chloride  SA (KLOR-CON  M) CR tablet 40 mEq  40 mEq Oral BID Amin, Ankit C, MD   40 mEq at 09/17/23 0805   pravastatin  (PRAVACHOL ) tablet 40 mg  40 mg Oral Q1500 Amin, Ankit C, MD   40 mg at 09/16/23 1409   thiamine  (VITAMIN B1) injection 100 mg  100 mg Intravenous Q24H Doutova, Anastassia, MD   100 mg at 09/16/23 1136    Allergies as of 09/15/2023   (No Known Allergies)    Social History   Socioeconomic History   Marital status: Divorced    Spouse name: Not on file   Number of children: Not on file   Years of education: Not on file   Highest education level: Not on file  Occupational History   Not on file  Tobacco Use   Smoking status: Some Days    Current packs/day: 0.50     Types: Cigarettes   Smokeless tobacco: Never  Vaping Use   Vaping status: Never Used  Substance and Sexual Activity   Alcohol  use: No   Drug use: Yes    Types: Marijuana   Sexual activity: Not Currently  Other Topics Concern   Not on file  Social History Narrative   Not on file   Social Drivers of Health   Financial Resource Strain: Not on file  Food Insecurity: Unknown (09/16/2023)   Hunger Vital Sign    Worried About Running Out of Food in the Last Year: Never true    Ran Out of Food in the Last Year: Not on file  Transportation Needs: Unknown (09/03/2023)   PRAPARE - Administrator, Civil Service (Medical): Not on file    Lack of Transportation (Non-Medical): No  Physical Activity: Not on file  Stress: Not on file  Social Connections: Moderately Integrated (08/29/2023)   Social Connection and Isolation Panel    Frequency of Communication with Friends and Family: More than three times a week    Frequency of Social Gatherings with Friends and Family: More than three times a week    Attends Religious Services: More than 4 times per year    Active Member of [employer] or Organizations: Yes    Attends Banker Meetings: Never    Marital Status: Divorced  Catering manager Violence: Not At Risk (09/03/2023)   Humiliation, Afraid, Rape, and Kick questionnaire    Fear of Current or Ex-Partner: No    Emotionally Abused: No    Physically Abused: No    Sexually Abused: No     Code Status   Code Status: Full Code  Review of Systems: All systems reviewed  and negative except where noted in HPI.  Physical Exam: Vital signs in last 24 hours: Temp:  [97.6 F (36.4 C)-98.8 F (37.1 C)] 98.4 F (36.9 C) (09/16 0835) Pulse Rate:  [66-70] 70 (09/16 0835) Resp:  [12-14] 14 (09/16 0835) BP: (90-113)/(55-72) 106/67 (09/16 0835) SpO2:  [97 %-100 %] 100 % (09/16 0835) Last BM Date : 09/16/23  General:  Pleasant female in NAD Psych:  Cooperative. Normal mood and  affect Eyes: Pupils equal Ears:  Normal auditory acuity Nose: No deformity, discharge or lesions Neck:  Supple, no masses felt Lungs:  Clear to auscultation.  Heart:  Regular rate, regular rhythm.  Abdomen:  Soft, nondistended, nontender, active bowel sounds, no masses felt Rectal :  Deferred Msk: Symmetrical without gross deformities.  Neurologic:  Alert, oriented, grossly normal neurologically Extremities : No edema Skin:  Intact without significant lesions.    Intake/Output from previous day: 09/15 0701 - 09/16 0700 In: 28.9 [I.V.:9.7; IV Piggyback:19.2] Out: -  Intake/Output this shift:  No intake/output data recorded.   Vina Dasen, NP-C   09/17/2023, 8:56 AM  ---------------------------------------------   I have taken a history, reviewed the chart and examined the patient. I performed a substantive portion of this encounter, including complete performance of at least one of the key components, in conjunction with the APP. I agree with the APP's note, impression and recommendations  67 year old female with recent hip surgery in July, followed by persistent symptoms of decreased appetite, dysgeusia, weight loss and malnutrition.  Patient reports that food just does not taste good and is not appealing.  When she does eat, it just seems to come back up.  She denies any significant abdominal pain.  Sometimes she vomits, and sometimes the food just seems to come back up. She does tell me that things seem to be better with her this evening, and she had eaten most of the sandwich today at dinner. CT of the abdomen pelvis was unrevealing for any cause of her symptoms other than possible enteritis.  Unclear etiology of her symptoms.  Low suspicion for a primary gastrointestinal disorder.  Given her significant dysgeusia component, I suspect this may be more related to medication side effect, versus COVID phenomenon.  Will start with a barium swallow to assess the patient's reports  of food and pills just coming back up as soon as she swallows. Will consider upper endoscopy, but have low suspicion we will find a pathology to explain her symptoms endoscopically. Gastric emptying study may also be warranted.  Neiva Maenza E. Stacia, MD [facility]  Moderate complex medical decision making (this includes chart review, review of results, face-to-face time used for counseling as well as treatment plan and follow-up. The patient was provided an opportunity to ask questions and all were answered. The patient agreed with the plan and demonstrated an understanding of the instructions

## 2023-09-17 NOTE — Plan of Care (Addendum)
 Elevated lactic acidosis in the setting of dehydration-in the context of nausea vomiting Lactic acid is elevated.  Concern for lactic acidosis in setting of nausea vomiting and metabolic acidosis.  Will check a stat BMP to verify in case patient will start bicarb infusion. Giving 1 L of LR bolus.  Update, continue to trend lactic acid level.  VBG unremarkable except slightly low pO2 31 which is as expected.   Status post 1 L of LR bolus.  BMP showing slightly low bicarb 20 normal anion gap.  Starting low-dose bicarb infusion.

## 2023-09-17 NOTE — TOC Initial Note (Signed)
 Transition of Care (TOC) - Initial/Assessment Note  Angie Gobble RN, BSN Inpatient Care Management Unit 4E- RN Case Manager See Treatment Team for direct phone # [phone] cross coverage  Patient Details  Name: Angie Carr MRN: [id number] Date of Birth: [birth year]  Transition of Care [facility]) CM/SW Contact:    Carr Angie Hurst, RN Phone Number: 09/17/2023, 3:58 PM  Clinical Narrative:                 Noted orders placed for HHPT/OT/aide.   Patient called by TC to discuss [facility] and transition needs.   Per pt she lives at home with family, has DME post hip repair and denies any new DME needs at this time.  Pt states she usually uses [facility] for transportation needs, will see about transportation for discharge.   Discussed HH recommendations and orders- pt voiced she was to start outpt therapy after her hip replacement, however has not started yet and agreeable to [facility] at this time with plan to transition to outpt when she has her strength back.  Choice offered for [facility] provider with list read to pt from [email or web address] pt voiced she has never had HH but is familiar with several agencies- does not have a preference is agreeable to see if [facility], [facility] or [facility] can service her based on her insurance and star ratings.   Address, phone # and PCP all confirmed with pt.   Call made to [facility] liaison- for [facility] referral- referral has been accepted for HHPT/OT- they will follow up to schedule start of care within 48 hr of discharge.   No further IP care management intervention needed at this time.   Expected Discharge Plan: Home w Home Health Services Barriers to Discharge: No Barriers Identified   Patient Goals and CMS Choice Patient states their goals for this hospitalization and ongoing recovery are:: return home and get stronger, recover   Choice offered to / list presented to : Patient      Expected Discharge Plan and Services   Discharge Planning Services: CM Consult Post Acute  Care Choice: Home Health Living arrangements for the past 2 months: Single Family Home                 DME Arranged: N/A DME Agency: NA       HH Arranged: PT, OT, Nurse's Aide HH Agency: [facility] Date [facility] Agency Contacted: 09/17/23 Time HH Agency Contacted: 1526 Representative spoke with at [facility] Agency: Amy  Prior Living Arrangements/Services Living arrangements for the past 2 months: Single Family Home Lives with:: Self, Adult Children Patient language and need for interpreter reviewed:: Yes Do you feel safe going back to the place where you live?: Yes      Need for Family Participation in Patient Care: Yes (Comment) Care giver support system in place?: Yes (comment) Current home services: DME (rolling walker, cane, rollator, toilet riser) Criminal Activity/Legal Involvement Pertinent to Current Situation/Hospitalization: No - Comment as needed  Activities of Daily Living   ADL Screening (condition at time of admission) Independently performs ADLs?: Yes (appropriate for developmental age) Is the patient deaf or have difficulty hearing?: No Does the patient have difficulty seeing, even when wearing glasses/contacts?: No Does the patient have difficulty concentrating, remembering, or making decisions?: No  Permission Sought/Granted Permission sought to share information with : Facility Industrial/product designer granted to share information with : Yes, Verbal Permission Granted     Permission granted to share info w AGENCY: [facility]  Emotional Assessment Appearance:: Appears stated age Attitude/Demeanor/Rapport: Engaged Affect (typically observed): Accepting, Pleasant Orientation: : Oriented to Self, Oriented to Place, Oriented to Situation, Oriented to  Time Alcohol  / Substance Use: Not Applicable Psych Involvement: No (comment)  Admission diagnosis:  Dehydration [E86.0] Hypomagnesemia [E83.42] High anion gap metabolic acidosis [E87.29] COVID-19  [U07.1] Patient Active Problem List   Diagnosis Date Noted   Dehydration 09/15/2023   COVID-19 virus infection 09/15/2023   Hypomagnesemia 09/15/2023   Protein calorie malnutrition (HCC) 09/15/2023   Uncontrolled type 2 diabetes mellitus with hypoglycemia, without long-term current use of insulin  (HCC) 09/15/2023   Peripheral arterial disease (HCC) 08/29/2023   Diarrhea 08/29/2023   Pulmonary emboli (HCC) 08/29/2023   Pulmonary embolism (HCC) 08/28/2023   Febrile illness 12/11/2014   Severe sepsis (HCC) 12/11/2014   Hypotension 12/11/2014   Acute kidney injury (HCC) 12/11/2014   Trichomonal vaginitis    Absolute anemia    Tobacco abuse 11/04/2012   Diabetes mellitus, type 2 (HCC) 03/18/2012   Morbid obesity (HCC) 03/18/2012   Hypertension associated with diabetes (HCC) 03/18/2012   Hyperlipidemia LDL goal <70 03/18/2012   Arthritis 03/18/2012   PCP:  Angie Aland, MD Pharmacy:   [facility] [address]), [place] - [address] [place] ([place]) [place] [address] Phone: [phone] Fax: [phone]  [facility] - [place], [place] - [address] [place] [address] Phone: [phone] Fax: [phone]  [facility] [address] [place] [address] Phone: [phone] Fax: [phone]     Social Drivers of Health (SDOH) Social History: SDOH Screenings   Food Insecurity: No Food Insecurity (09/17/2023)  Housing: Low Risk  (09/17/2023)  Transportation Needs: No Transportation Needs (09/17/2023)  Utilities: Not At Risk (09/17/2023)  Depression (PHQ2-9): Low Risk  (09/10/2023)  Social Connections: Moderately Integrated (09/17/2023)  Tobacco Use: High Risk (09/15/2023)   SDOH Interventions:     Readmission Risk Interventions    09/17/2023    3:57 PM 08/29/2023    2:12 PM  Readmission Risk Prevention Plan  Transportation Screening Complete Complete   PCP or Specialist Appt within 5-7 Days  Complete  Home Care Screening Complete Complete  Medication Review (RN CM) Complete Referral to Pharmacy

## 2023-09-17 NOTE — Progress Notes (Signed)
 Initial Nutrition Assessment  DOCUMENTATION CODES:      INTERVENTION:  Continue Regular diet, encourage PO intake Magic cup daily with dinner, each supplement provides 290 kcal and 9 grams of protein [employer] 1.4 PO daily with dinner, each supplement provides 455 kcal and 20 grams protein. Check Zinc  and CRP levels due to noted changes with taste and diarrhea Obtain updated admission weight     NUTRITION DIAGNOSIS:   Inadequate oral intake related to dysphagia, nausea, decreased appetite as evidenced by per patient/family report, meal completion < 25%.   GOAL:   Patient will meet greater than or equal to 90% of their needs   MONITOR:   PO intake, Weight trends, Supplement acceptance  REASON FOR ASSESSMENT:   Consult Assessment of nutrition requirement/status  ASSESSMENT:   Patient seen in room, sitting up in chair. Patient reports minimal PO intake over the past 6 weeks since her hip surgery at the end of July due to taste changes, dysphagia and N/V. 60 lbs weight loss reported since her surgery, pre surgery weight of 238 lbs. Current chart weight (230 lbs) from 8/27, no current weights taken this admission. Unable to take bed scale weight during visit (patient sitting in chair). Mild wasting of upper arm and temple regions on NFPE. RD assisted in ordering patient lunch tray for today. Discussed oral supplement options, patient open to trying Magic cup and [employer] 1.4 with dinner. Seen by GI today, plan for barium swallow. Unable to diagnosis patient with malnutrition at this time, however she remains high risk for malnutrition/refeeding.  Labs: K 2.9  Calcium  7.9 (corrected 8.94) Mg 1.2  Meds: Remeron  nightly  B1 100 mg IV daily    NUTRITION - FOCUSED PHYSICAL EXAM:  Flowsheet Row Most Recent Value  Orbital Region No depletion  Upper Arm Region Mild depletion  Buccal Region No depletion  Temple Region Mild depletion  Clavicle Bone Region No depletion   Clavicle and Acromion Bone Region No depletion  Scapular Bone Region No depletion  Dorsal Hand No depletion  Patellar Region No depletion  Anterior Thigh Region No depletion  Posterior Calf Region No depletion  Hair Reviewed  Eyes Reviewed  Mouth Reviewed  Skin Reviewed  Nails Reviewed    Diet Order:   Diet Order             Diet regular Room service appropriate? Yes; Fluid consistency: Thin  Diet effective now                   EDUCATION NEEDS:   No education needs have been identified at this time  Skin:  Skin Assessment: Reviewed RN Assessment  Last BM:  9/15  Height:   Ht Readings from Last 1 Encounters:  08/28/23 5' 4 (1.626 m)    Weight:   Wt Readings from Last 1 Encounters:  08/28/23 104.8 kg    Ideal Body Weight:  54.5 kg  BMI:  There is no height or weight on file to calculate BMI.  Estimated Nutritional Needs:   Kcal:  8636-8364 kcals  Protein:  55-65 grams  Fluid:  1635 ml/d    Staci Dack, MS, RD, LDN Clinical Dietitian  Please see AMiON for contact information.

## 2023-09-17 NOTE — Plan of Care (Signed)

## 2023-09-17 NOTE — Progress Notes (Signed)
 PROGRESS NOTE    Angie Carr  FMW:[id number] DOB: 08/29/1955 DOA: 09/15/2023 PCP: Benjamine Aland, MD    Brief Narrative:   68 year old with history of DM2, HTN, PVD PE on Eliquis , asthma, HTN, neuromuscular disorder comes to the hospital with nausea, vomiting, weight loss.  She was noted to have COVID-19 infection with multiple electrolyte normalities and dehydration.  Assessment & Plan:   Nausea/vomiting and weight loss COVID-19 infection - CT abdomen pelvis shows possible small degree of enteritis.  Nausea vomiting could be secondary to COVID-19 infection.  Continue supportive care at this time.  Gentle hydration as necessary.  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 intake and weight loss.  Currently diet controlled  Hyperlipidemia - Statin  Peripheral vascular disease - Moderate disease of left lower extremity.  No evidence of severe claudication at this time.  Outpatient follow-up  Congestive heart failure with preserved EF, 60% - Recent echocardiogram showed preserved EF, grade 1 DD  DVT prophylaxis: SCDs Start: 09/16/23 0851Eliquis    Code Status: Full Code Family Communication:   Status is: Inpatient Remains inpatient appropriate because: Aggressive electrolyte repletion today, hopefully discharge tomorrow  PT Follow up Recs: HH, F2F completed.   Subjective: Seen at bedside, tells me she feels a little better compared to yesterday today.  Sitting up in her recliner.   Examination:  General exam: Appears calm and comfortable  Respiratory system: Clear to auscultation. Respiratory effort normal. Cardiovascular system: S1 & S2 heard, RRR. No  JVD, murmurs, rubs, gallops or clicks. No pedal edema. Gastrointestinal system: Abdomen is nondistended, soft and nontender. No organomegaly or masses felt. Normal bowel sounds heard. Central nervous system: Alert and oriented. No focal neurological deficits. Extremities: Symmetric 5 x 5 power. Skin: No rashes, lesions or ulcers Psychiatry: Judgement and insight appear normal. Mood & affect appropriate.                Diet Orders (From admission, onward)     Start     Ordered   09/16/23 1230  Diet regular Room service appropriate? Yes; Fluid consistency: Thin  Diet effective now       Question Answer Comment  Room service appropriate? Yes   Fluid consistency: Thin      09/16/23 1229            Objective: Vitals:   09/17/23 0500 09/17/23 0737 09/17/23 0835 09/17/23 1204  BP: 101/63 (!) 91/59 106/67 (!) 96/59  Pulse: 66 68 70 62  Resp:  12 14 14   Temp: 98.4 F (36.9 C) 98.2 F (36.8 C) 98.4 F (36.9 C) 99.1 F (37.3 C)  TempSrc: Oral Oral Oral Oral  SpO2: 98% 99% 100% 100%    Intake/Output Summary (Last 24 hours) at 09/17/2023 1244 Last data filed at 09/16/2023 1556 Gross per 24 hour  Intake 28.89 ml  Output --  Net 28.89 ml   There were no vitals filed for this visit.  Scheduled Meds:  apixaban   5 mg Oral BID   cilostazol   50 mg Oral BID   fesoterodine   4 mg Oral Daily   gabapentin   400 mg Oral Daily   gabapentin   800 mg Oral QHS   mirtazapine   7.5 mg Oral Daily   nebivolol   5 mg  Oral Daily   pantoprazole  (PROTONIX ) IV  40 mg Intravenous Q12H   potassium chloride   40 mEq Oral BID   pravastatin   40 mg Oral Q1500   thiamine  (VITAMIN B1) injection  100 mg Intravenous Q24H   Continuous Infusions:  0.9 % NaCl with KCl 40 mEq / L 75 mL/hr at 09/17/23 0804    Nutritional status     There is no height or weight on file to calculate BMI.  Data Reviewed:   CBC: Recent Labs  Lab 09/15/23 1721 09/16/23 0258 09/16/23 0420 09/16/23 1304  09/17/23 0514  WBC 3.2* 3.3*  --  5.3 4.4  NEUTROABS  --  1.4*  --   --   --   HGB 12.7 10.6* 10.9* 10.6* 9.9*  HCT 39.7 32.8* 32.0* 31.6* 28.9*  MCV 96.6 94.3  --  91.9 91.7  PLT 225 181  --  189 164   Basic Metabolic Panel: Recent Labs  Lab 09/15/23 1721 09/15/23 1941 09/16/23 0258 09/16/23 0420 09/16/23 1304 09/17/23 0514  NA 132*  --  136 137 136 141  K 3.6  --  3.2* 3.0* 3.3* 2.9*  CL 93*  --  100  --  100 103  CO2 16*  --  19*  --  26 20*  GLUCOSE 65*  --  70  --  84 76  BUN 20  --  17  --  14 10  CREATININE 1.17*  --  0.92  --  0.88 0.72  CALCIUM  8.6*  --  7.8*  --  9.1 7.9*  MG  --  1.0*  --   --  1.4* 1.2*  PHOS  --   --  2.0*  --  2.3* 4.5   GFR: CrCl cannot be calculated (Unknown ideal weight.). Liver Function Tests: Recent Labs  Lab 09/15/23 1721 09/16/23 1304  AST 22 19  ALT 18 15  ALKPHOS 75 56  BILITOT 1.9* 1.4*  PROT 7.4 5.7*  ALBUMIN 3.6 2.7*   Recent Labs  Lab 09/15/23 1721  LIPASE 26   No results for input(s): AMMONIA in the last 168 hours. Coagulation Profile: No results for input(s): INR, PROTIME in the last 168 hours. Cardiac Enzymes: Recent Labs  Lab 09/16/23 0258  CKTOTAL 40   BNP (last 3 results) No results for input(s): PROBNP in the last 8760 hours. HbA1C: No results for input(s): HGBA1C in the last 72 hours. CBG: Recent Labs  Lab 09/16/23 2135 09/17/23 0210 09/17/23 0602 09/17/23 0833 09/17/23 1202  GLUCAP 82 86 85 77 88   Lipid Profile: No results for input(s): CHOL, HDL, LDLCALC, TRIG, CHOLHDL, LDLDIRECT in the last 72 hours. Thyroid Function Tests: Recent Labs    09/15/23 1721 09/16/23 0258  TSH 2.688  --   FREET4  --  1.01   Anemia Panel: Recent Labs    09/15/23 1721 09/16/23 0258  VITAMINB12  --  287  FOLATE  --  18.2  FERRITIN  --  119  TIBC  --  197*  IRON  --  56  RETICCTPCT 1.7  --    Sepsis Labs: Recent Labs  Lab 09/16/23 0258 09/17/23 1141  PROCALCITON <0.10   --   LATICACIDVEN 1.1 1.1    Recent Results (from the past 240 hours)  Resp panel by RT-PCR (RSV, Flu A&B, Covid) Anterior Nasal Swab     Status: Abnormal   Collection Time: 09/15/23  6:03 PM   Specimen: Anterior Nasal Swab  Result Value Ref Range  Status   SARS Coronavirus 2 by RT PCR POSITIVE (A) NEGATIVE Final   Influenza A by PCR NEGATIVE NEGATIVE Final   Influenza B by PCR NEGATIVE NEGATIVE Final    Comment: (NOTE) The Xpert Xpress SARS-CoV-2/FLU/RSV plus assay is intended as an aid in the diagnosis of influenza from Nasopharyngeal swab specimens and should not be used as a sole basis for treatment. Nasal washings and aspirates are unacceptable for Xpert Xpress SARS-CoV-2/FLU/RSV testing.  Fact Sheet for Patients: BloggerCourse.com  Fact Sheet for Healthcare Providers: SeriousBroker.it  This test is not yet approved or cleared by the [place]  FDA and has been authorized for detection and/or diagnosis of SARS-CoV-2 by FDA under an Emergency Use Authorization (EUA). This EUA will remain in effect (meaning this test can be used) for the duration of the COVID-19 declaration under Section 564(b)(1) of the Act, 21 U.S.C. section 360bbb-3(b)(1), unless the authorization is terminated or revoked.     Resp Syncytial Virus by PCR NEGATIVE NEGATIVE Final    Comment: (NOTE) Fact Sheet for Patients: BloggerCourse.com  Fact Sheet for Healthcare Providers: SeriousBroker.it  This test is not yet approved or cleared by the [place]  FDA and has been authorized for detection and/or diagnosis of SARS-CoV-2 by FDA under an Emergency Use Authorization (EUA). This EUA will remain in effect (meaning this test can be used) for the duration of the COVID-19 declaration under Section 564(b)(1) of the Act, 21 U.S.C. section 360bbb-3(b)(1), unless the authorization is terminated  or revoked.  Performed at [facility], [address] [place], [place] [address]          Radiology Studies: VAS US  ABI WITH/WO TBI Result Date: 09/16/2023  LOWER EXTREMITY DOPPLER STUDY Patient Name:  Angie Carr  Date of Exam:   09/16/2023 Medical Rec #: [id number]           Accession #:    [id number] Date of Birth: December 03, 1955           Patient Gender: F Patient Age:   7 years Exam Location:  [facility] Procedure:      VAS US  ABI WITH/WO TBI Referring Phys: BLEASE DOUTOVA --------------------------------------------------------------------------------  Indications: Peripheral artery disease. High Risk Factors: Hypertension, hyperlipidemia, Diabetes, current smoker.  Comparison Study: No prior exam. Performing Technologist: Edilia Elden Appl  Examination Guidelines: A complete evaluation includes at minimum, Doppler waveform signals and systolic blood pressure reading at the level of bilateral brachial, anterior tibial, and posterior tibial arteries, when vessel segments are accessible. Bilateral testing is considered an integral part of a complete examination. Photoelectric Plethysmograph (PPG) waveforms and toe systolic pressure readings are included as required and additional duplex testing as needed. Limited examinations for reoccurring indications may be performed as noted.  ABI Findings: +---------+------------------+-----+---------+--------+ Right    Rt Pressure (mmHg)IndexWaveform Comment  +---------+------------------+-----+---------+--------+ Brachial 131                    triphasic         +---------+------------------+-----+---------+--------+ PTA      125               0.95 triphasic         +---------+------------------+-----+---------+--------+ DP       131               1.00 triphasic         +---------+------------------+-----+---------+--------+ Great Toe108               0.82 Normal             +---------+------------------+-----+---------+--------+ +---------+------------------+-----+---------+-------+  Left     Lt Pressure (mmHg)IndexWaveform Comment +---------+------------------+-----+---------+-------+ Brachial 126                    triphasic        +---------+------------------+-----+---------+-------+ PTA      92                0.70 biphasic         +---------+------------------+-----+---------+-------+ DP       84                0.64 biphasic         +---------+------------------+-----+---------+-------+ Great Toe64                0.49 Normal           +---------+------------------+-----+---------+-------+ +-------+-----------+-----------+------------+------------+ ABI/TBIToday's ABIToday's TBIPrevious ABIPrevious TBI +-------+-----------+-----------+------------+------------+ Right  1          0.82                                +-------+-----------+-----------+------------+------------+ Left   0.70       0.49                                +-------+-----------+-----------+------------+------------+  Summary: Right: Resting right ankle-brachial index is within normal range. The right toe-brachial index is normal.  Left: Resting left ankle-brachial index indicates moderate left lower extremity arterial disease. The left toe-brachial index is abnormal.  *See table(s) above for measurements and observations.  Electronically signed by Debby Robertson on 09/16/2023 at 11:36:11 AM.    Final    DG CHEST PORT 1 VIEW Result Date: 09/16/2023 CLINICAL DATA:  Dehydration EXAM: PORTABLE CHEST 1 VIEW COMPARISON:  12/13/2014 FINDINGS: Cardiac shadows within normal limits. Aortic calcifications are noted. The lungs are well aerated bilaterally. No focal infiltrate or effusion is seen. No bony abnormality is noted. IMPRESSION: No acute abnormality noted. Electronically Signed   By: Oneil Devonshire M.D.   On: 09/16/2023 00:50   CT ABDOMEN PELVIS W CONTRAST Result Date:  09/15/2023 CLINICAL DATA:  Right lower quadrant pain EXAM: CT ABDOMEN AND PELVIS WITH CONTRAST TECHNIQUE: Multidetector CT imaging of the abdomen and pelvis was performed using the standard protocol following bolus administration of intravenous contrast. RADIATION DOSE REDUCTION: This exam was performed according to the departmental dose-optimization program which includes automated exposure control, adjustment of the mA and/or kV according to patient size and/or use of iterative reconstruction technique. CONTRAST:  75mL OMNIPAQUE  IOHEXOL  350 MG/ML SOLN COMPARISON:  08/28/2023 FINDINGS: Lower chest: Lung bases are free of acute infiltrate or sizable effusion. No incidental pulmonary emboli seen. Hepatobiliary: No focal liver abnormality is seen. Status post cholecystectomy. No biliary dilatation. Pancreas: Unremarkable. No pancreatic ductal dilatation or surrounding inflammatory changes. Spleen: Normal in size without focal abnormality. Adrenals/Urinary Tract: Adrenal glands are within normal limits. Kidneys demonstrate a normal enhancement pattern bilaterally. No renal calculi or obstructive changes are seen. The bladder is decompressed. Stomach/Bowel: Diverticular change of the colon is noted without evidence of diverticulitis. This is stable from the prior exam. Transverse colon again extends into a ventral hernia similar to that noted on the prior study. A small fat containing hernia is noted just below and to the right of this more superior ventral hernia. The proximal colon is within normal limits. The appendix is unremarkable. Small bowel and stomach are mildly prominent without  true obstruction which may represent a degree of enteritis. Vascular/Lymphatic: Aortic atherosclerosis. No enlarged abdominal or pelvic lymph nodes. Reproductive: Status post hysterectomy. No adnexal masses. Other: No free fluid is noted. A fat containing periumbilical hernia is noted on the right similar to that seen on the prior  exam. A small ventral hernia is noted just superior to that containing a portion of the transverse colon without obstructive change. The overall appearance is stable. Musculoskeletal: Left hip replacement is noted. Degenerative changes of the right hip joint and lumbar spine are noted. IMPRESSION: Mild small bowel prominence centrally although no true obstructive changes are seen. This may represent a degree of enteritis. Diverticulosis without diverticulitis. Normal-appearing appendix. Stable hernias as described above. Electronically Signed   By: Oneil Devonshire M.D.   On: 09/15/2023 22:42           LOS: 1 day   Time spent= 35 mins    Burgess JAYSON Dare, MD [facility]  If 7PM-7AM, please contact night-coverage  09/17/2023, 12:44 PM

## 2023-09-18 ENCOUNTER — Observation Stay (HOSPITAL_COMMUNITY)

## 2023-09-18 ENCOUNTER — Telehealth

## 2023-09-18 DIAGNOSIS — R638 Other symptoms and signs concerning food and fluid intake: Secondary | ICD-10-CM | POA: Diagnosis not present

## 2023-09-18 DIAGNOSIS — R112 Nausea with vomiting, unspecified: Secondary | ICD-10-CM | POA: Diagnosis not present

## 2023-09-18 DIAGNOSIS — R634 Abnormal weight loss: Secondary | ICD-10-CM | POA: Diagnosis not present

## 2023-09-18 DIAGNOSIS — E039 Hypothyroidism, unspecified: Secondary | ICD-10-CM | POA: Diagnosis present

## 2023-09-18 DIAGNOSIS — E785 Hyperlipidemia, unspecified: Secondary | ICD-10-CM | POA: Diagnosis present

## 2023-09-18 DIAGNOSIS — F17210 Nicotine dependence, cigarettes, uncomplicated: Secondary | ICD-10-CM | POA: Diagnosis present

## 2023-09-18 DIAGNOSIS — R197 Diarrhea, unspecified: Secondary | ICD-10-CM

## 2023-09-18 DIAGNOSIS — R1310 Dysphagia, unspecified: Secondary | ICD-10-CM

## 2023-09-18 DIAGNOSIS — Z7901 Long term (current) use of anticoagulants: Secondary | ICD-10-CM | POA: Diagnosis not present

## 2023-09-18 DIAGNOSIS — R432 Parageusia: Secondary | ICD-10-CM

## 2023-09-18 DIAGNOSIS — J45909 Unspecified asthma, uncomplicated: Secondary | ICD-10-CM | POA: Diagnosis present

## 2023-09-18 DIAGNOSIS — E46 Unspecified protein-calorie malnutrition: Secondary | ICD-10-CM | POA: Diagnosis present

## 2023-09-18 DIAGNOSIS — U071 COVID-19: Secondary | ICD-10-CM | POA: Diagnosis present

## 2023-09-18 DIAGNOSIS — K224 Dyskinesia of esophagus: Secondary | ICD-10-CM | POA: Diagnosis not present

## 2023-09-18 DIAGNOSIS — E860 Dehydration: Secondary | ICD-10-CM | POA: Diagnosis not present

## 2023-09-18 DIAGNOSIS — I110 Hypertensive heart disease with heart failure: Secondary | ICD-10-CM | POA: Diagnosis present

## 2023-09-18 DIAGNOSIS — E1151 Type 2 diabetes mellitus with diabetic peripheral angiopathy without gangrene: Secondary | ICD-10-CM | POA: Diagnosis present

## 2023-09-18 DIAGNOSIS — Z7984 Long term (current) use of oral hypoglycemic drugs: Secondary | ICD-10-CM | POA: Diagnosis not present

## 2023-09-18 DIAGNOSIS — N179 Acute kidney failure, unspecified: Secondary | ICD-10-CM | POA: Diagnosis present

## 2023-09-18 DIAGNOSIS — I5032 Chronic diastolic (congestive) heart failure: Secondary | ICD-10-CM | POA: Diagnosis present

## 2023-09-18 DIAGNOSIS — D649 Anemia, unspecified: Secondary | ICD-10-CM | POA: Diagnosis present

## 2023-09-18 DIAGNOSIS — E872 Acidosis, unspecified: Secondary | ICD-10-CM | POA: Diagnosis present

## 2023-09-18 DIAGNOSIS — E11649 Type 2 diabetes mellitus with hypoglycemia without coma: Secondary | ICD-10-CM | POA: Diagnosis present

## 2023-09-18 DIAGNOSIS — E876 Hypokalemia: Secondary | ICD-10-CM | POA: Diagnosis present

## 2023-09-18 DIAGNOSIS — E1165 Type 2 diabetes mellitus with hyperglycemia: Secondary | ICD-10-CM | POA: Diagnosis present

## 2023-09-18 DIAGNOSIS — A0839 Other viral enteritis: Secondary | ICD-10-CM | POA: Diagnosis present

## 2023-09-18 DIAGNOSIS — I2699 Other pulmonary embolism without acute cor pulmonale: Secondary | ICD-10-CM | POA: Diagnosis present

## 2023-09-18 LAB — GLUCOSE, CAPILLARY
Glucose-Capillary: 99 mg/dL (ref 70–99)
Glucose-Capillary: 108 mg/dL — ABNORMAL HIGH (ref 70–99)
Glucose-Capillary: 104 mg/dL — ABNORMAL HIGH (ref 70–99)

## 2023-09-18 LAB — CBC
HCT: 29.6 % — ABNORMAL LOW (ref 36.0–46.0)
Hemoglobin: 9.7 g/dL — ABNORMAL LOW (ref 12.0–15.0)
MCH: 30.8 pg (ref 26.0–34.0)
MCHC: 32.8 g/dL (ref 30.0–36.0)
MCV: 94 fL (ref 80.0–100.0)
Platelets: 161 K/uL (ref 150–400)
RBC: 3.15 MIL/uL — ABNORMAL LOW (ref 3.87–5.11)
RDW: 16.6 % — ABNORMAL HIGH (ref 11.5–15.5)
WBC: 4.3 K/uL (ref 4.0–10.5)
nRBC: 0 % (ref 0.0–0.2)

## 2023-09-18 LAB — BASIC METABOLIC PANEL WITH GFR
Anion gap: 9 (ref 5–15)
BUN: 5 mg/dL — ABNORMAL LOW (ref 8–23)
CO2: 23 mmol/L (ref 22–32)
Calcium: 7.6 mg/dL — ABNORMAL LOW (ref 8.9–10.3)
Chloride: 109 mmol/L (ref 98–111)
Creatinine, Ser: 0.66 mg/dL (ref 0.44–1.00)
GFR, Estimated: >60 mL/min (ref >60)
Glucose, Bld: 93 mg/dL (ref 70–99)
Potassium: 4.3 mmol/L (ref 3.5–5.1)
Sodium: 141 mmol/L (ref 135–145)

## 2023-09-18 LAB — LACTIC ACID, PLASMA
Lactic Acid, Venous: 2.2 mmol/L (ref 0.5–1.9)
Lactic Acid, Venous: 2.5 mmol/L (ref 0.5–1.9)

## 2023-09-18 LAB — C-REACTIVE PROTEIN: CRP: 1.1 mg/dL — ABNORMAL HIGH (ref <1.0)

## 2023-09-18 LAB — MAGNESIUM: Magnesium: 1.6 mg/dL — ABNORMAL LOW (ref 1.7–2.4)

## 2023-09-18 MED ORDER — SODIUM CHLORIDE 0.9 % IV BOLUS
500.0000 mL | Freq: Once | INTRAVENOUS | Status: AC
  Administered 2023-09-18: 500 mL via INTRAVENOUS

## 2023-09-18 NOTE — Progress Notes (Signed)
 Occupational Therapy Treatment Patient Details Name: Angie Carr MRN: [id number] DOB: [birth year] Today's Date: 09/18/2023   History of present illness Angie Carr is a 68 y.o. female who presented 9/14 with decreased p.o. intake nausea vomiting weight loss. Found to be COVID+. PMHx: DM2 , HTN, peripheral vascular disease history of PE currently on Eliquis , anxiety asthma hypertension neuromuscular disorder   OT comments  Pt is making great progress towards their acute OT goals. She continues to express concerns about not being able to keep her food down or taste. However she only required generalized supervision A for mobility and ADLs with RW. Pt has good management of RW and demonstrated improved tolerance for activity. OT to continue to follow acutely to facilitate progress towards established goals. Pt will [facility] to benefit from [facility].       If plan is discharge home, recommend the following:  A little help with walking and/or transfers;A little help with bathing/dressing/bathroom;Assistance with cooking/housework;Direct supervision/assist for medications management;Direct supervision/assist for financial management;Assist for transportation;Help with stairs or ramp for entrance         Precautions / Restrictions Precautions Precautions: Fall Recall of Precautions/Restrictions: Intact Precaution/Restrictions Comments: No formal hip precautions per Op Note 07/24/23 Restrictions Weight Bearing Restrictions Per Provider Order: No LLE Weight Bearing Per Provider Order: Weight bearing as tolerated       Mobility Bed Mobility               General bed mobility comments: OOB on arrival    Transfers Overall transfer level: Needs assistance Equipment used: Rolling walker (2 wheels) Transfers: Sit to/from Stand Sit to Stand: Supervision           General transfer comment: great mgmt of RW adn improved tolerance     Balance Overall balance assessment: Needs  assistance Sitting-balance support: Feet supported, No upper extremity supported Sitting balance-Leahy Scale: Fair     Standing balance support: During functional activity, No upper extremity supported Standing balance-Leahy Scale: Fair                             ADL either performed or assessed with clinical judgement   ADL Overall ADL's : Needs assistance/impaired Eating/Feeding: Independent   Grooming: Supervision/safety;Standing                   Toilet Transfer: Supervision/safety;Ambulation;Rolling walker (2 wheels)   Toileting- Clothing Manipulation and Hygiene: Modified independent       Functional mobility during ADLs: Supervision/safety General ADL Comments: pt is moving well with better tolerance for activity    Extremity/Trunk Assessment Upper Extremity Assessment Upper Extremity Assessment: Overall WFL for tasks assessed   Lower Extremity Assessment Lower Extremity Assessment: Defer to PT evaluation        Vision   Vision Assessment?: No apparent visual deficits   Perception Perception Perception: Within Functional Limits   Praxis Praxis Praxis: WFL   Communication Communication Communication: No apparent difficulties   Cognition Arousal: Alert Behavior During Therapy: WFL for tasks assessed/performed Cognition: No apparent impairments         Following commands: Intact        Cueing   Cueing Techniques: Verbal cues        General Comments VSS on RA    Pertinent Vitals/ Pain       Pain Assessment Pain Assessment: No/denies pain  Home Living Family/patient expects to be discharged to:: Private residence  Frequency  Min 2X/week        Progress Toward Goals  OT Goals(current goals can now be found in the care plan section)  Progress towards OT goals: Progressing toward goals  Acute Rehab OT Goals Patient Stated Goal: to get better OT Goal Formulation: With patient Time For  Goal Achievement: 09/30/23 Potential to Achieve Goals: Good ADL Goals Pt Will Perform Grooming: with modified independence Pt Will Perform Upper Body Dressing: with modified independence Pt Will Perform Lower Body Dressing: with modified independence;sit to/from stand Pt Will Transfer to Toilet: with modified independence;ambulating  Plan         AM-PAC OT 6 Clicks Daily Activity     Outcome Measure   Help from another person eating meals?: None Help from another person taking care of personal grooming?: A Little Help from another person toileting, which includes using toliet, bedpan, or urinal?: A Little Help from another person bathing (including washing, rinsing, drying)?: A Little Help from another person to put on and taking off regular upper body clothing?: A Little Help from another person to put on and taking off regular lower body clothing?: A Little 6 Click Score: 19    End of Session Equipment Utilized During Treatment: Rolling walker (2 wheels);Gait belt  OT Visit Diagnosis: Unsteadiness on feet (R26.81);Other abnormalities of gait and mobility (R26.89);Muscle weakness (generalized) (M62.81);Dizziness and giddiness (R42)   Activity Tolerance Patient tolerated treatment well   Patient Left in chair;with call bell/phone within reach   Nurse Communication Mobility status        Time: 8882-8861 OT Time Calculation (min): 21 min  Charges: OT General Charges $OT Visit: 1 Visit OT Treatments $Self Care/Home Management : 8-22 mins  Lucie Kendall, OTR/L Acute Rehabilitation Services Office [phone] Secure Chat Communication Preferred   Lucie JONETTA Kendall 09/18/2023, 11:55 AM

## 2023-09-18 NOTE — Progress Notes (Signed)
 PROGRESS NOTE    Angie Carr  FMW:[id number] DOB: 1955/11/21 DOA: 09/15/2023 PCP: Benjamine Aland, MD    Brief Narrative:   68 year old with history of DM2, HTN, PVD PE on Eliquis , asthma, HTN, neuromuscular disorder comes to the hospital with nausea, vomiting, weight loss.  She was noted to have COVID-19 infection with multiple electrolyte normalities and dehydration.  Electrolytes were aggressively repleted, received IV fluids during the hospitalization.  She started slowly improving.  There is also some concerns of dysphagia therefore GI is following.  Assessment & Plan:   Nausea/vomiting and weight loss COVID-19 infection - CT abdomen pelvis shows possible small degree of enteritis.  Nausea vomiting could be secondary to COVID-19 infection.  Continue supportive care at this time.  Gentle hydration as necessary.  Dehydration Hypokalemia/hypocalcemia/hypophosphatemia/hypomagnesemia - Aggressive repletion of electrolytes  Anion gap acidosis - Lactic acid slowly improving.  VBG is normal.  Weight loss/protein calorie malnutrition Dysphagia? 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 intake and weight loss.  Currently diet controlled  Hyperlipidemia - Statin  Peripheral vascular disease - Moderate disease of left lower extremity.  No evidence of severe claudication at this time.  Outpatient follow-up  Congestive heart failure with preserved EF, 60% - Recent echocardiogram showed preserved EF, grade 1 DD  DVT prophylaxis: SCDs Start: 09/16/23 0851Eliquis    Code Status: Full Code Family Communication:   Status is: Inpatient Remains inpatient appropriate because: Ongoing GI eval for dysphagia  PT Follow up Recs: HH, F2F completed.   Subjective: Seen at bedside no new complaints.  Feels hungry  Examination:  General exam: Appears calm and comfortable   Respiratory system: Clear to auscultation. Respiratory effort normal. Cardiovascular system: S1 & S2 heard, RRR. No JVD, murmurs, rubs, gallops or clicks. No pedal edema. Gastrointestinal system: Abdomen is nondistended, soft and nontender. No organomegaly or masses felt. Normal bowel sounds heard. Central nervous system: Alert and oriented. No focal neurological deficits. Extremities: Symmetric 5 x 5 power. Skin: No rashes, lesions or ulcers Psychiatry: Judgement and insight appear normal. Mood & affect appropriate.                Diet Orders (From admission, onward)     Start     Ordered   09/16/23 1230  Diet regular Room service appropriate? Yes; Fluid consistency: Thin  Diet effective now       Question Answer Comment  Room service appropriate? Yes   Fluid consistency: Thin      09/16/23 1229            Objective: Vitals:   09/17/23 1959 09/18/23 0442 09/18/23 0537 09/18/23 0737  BP: 103/61 116/69  92/64  Pulse: 71 81  73  Resp: 20 20    Temp: (!) 97.4 F (36.3 C) (!) 100.7 F (38.2 C) 99.3 F (37.4 C) (!) 97.5 F (36.4 C)  TempSrc: Oral Oral Oral Oral  SpO2: 99% 95%  95%  Weight:      Height:        Intake/Output Summary (Last 24 hours) at 09/18/2023 1152 Last data filed at 09/18/2023 1007 Gross per 24 hour  Intake 1305.43 ml  Output --  Net 1305.43 ml   Filed Weights   09/17/23 1300  Weight: 104.8 kg    Scheduled Meds:  apixaban   5 mg Oral BID   cilostazol   50 mg Oral BID  feeding supplement (KATE FARMS STANDARD 1.4)  325 mL Oral Q supper   fesoterodine   4 mg Oral Daily   gabapentin   400 mg Oral Daily   gabapentin   800 mg Oral QHS   mirtazapine   7.5 mg Oral Daily   nebivolol   5 mg Oral Daily   pantoprazole  (PROTONIX ) IV  40 mg Intravenous Q12H   pravastatin   40 mg Oral Q1500   thiamine  (VITAMIN B1) injection  100 mg Intravenous Q24H   Continuous Infusions:  Nutritional status Signs/Symptoms: per patient/family report, meal  completion < 25% Interventions: Magic cup Body mass index is 39.66 kg/m.  Data Reviewed:   CBC: Recent Labs  Lab 09/15/23 1721 09/16/23 0258 09/16/23 0420 09/16/23 1304 09/17/23 0514 09/18/23 0751  WBC 3.2* 3.3*  --  5.3 4.4 4.3  NEUTROABS  --  1.4*  --   --   --   --   HGB 12.7 10.6* 10.9* 10.6* 9.9* 9.7*  HCT 39.7 32.8* 32.0* 31.6* 28.9* 29.6*  MCV 96.6 94.3  --  91.9 91.7 94.0  PLT 225 181  --  189 164 161   Basic Metabolic Panel: Recent Labs  Lab 09/15/23 1941 09/16/23 0258 09/16/23 0420 09/16/23 1304 09/17/23 0514 09/17/23 1141 09/17/23 2025 09/18/23 0751  NA  --  136   < > 136 141 140 139 141  K  --  3.2*   < > 3.3* 2.9* 3.4* 3.6 4.3  CL  --  100  --  100 103 105 105 109  CO2  --  19*  --  26 20* 22 20* 23  GLUCOSE  --  70  --  84 76 83 127* 93  BUN  --  17  --  14 10 10 9  5*  CREATININE  --  0.92  --  0.88 0.72 0.75 0.76 0.66  CALCIUM   --  7.8*  --  9.1 7.9* 8.0* 7.8* 7.6*  MG 1.0*  --   --  1.4* 1.2* 2.1  --  1.6*  PHOS  --  2.0*  --  2.3* 4.5  --   --   --    < > = values in this interval not displayed.   GFR: Estimated Creatinine Clearance: 78.3 mL/min (by C-G formula based on SCr of 0.66 mg/dL). Liver Function Tests: Recent Labs  Lab 09/15/23 1721 09/16/23 1304  AST 22 19  ALT 18 15  ALKPHOS 75 56  BILITOT 1.9* 1.4*  PROT 7.4 5.7*  ALBUMIN 3.6 2.7*   Recent Labs  Lab 09/15/23 1721  LIPASE 26   No results for input(s): AMMONIA in the last 168 hours. Coagulation Profile: No results for input(s): INR, PROTIME in the last 168 hours. Cardiac Enzymes: Recent Labs  Lab 09/16/23 0258  CKTOTAL 40   BNP (last 3 results) No results for input(s): PROBNP in the last 8760 hours. HbA1C: No results for input(s): HGBA1C in the last 72 hours. CBG: Recent Labs  Lab 09/17/23 1202 09/17/23 1652 09/17/23 2125 09/18/23 0614 09/18/23 1150  GLUCAP 88 113* 123* 99 108*   Lipid Profile: No results for input(s): CHOL, HDL,  LDLCALC, TRIG, CHOLHDL, LDLDIRECT in the last 72 hours. Thyroid Function Tests: Recent Labs    09/15/23 1721 09/16/23 0258  TSH 2.688  --   FREET4  --  1.01   Anemia Panel: Recent Labs    09/15/23 1721 09/16/23 0258  VITAMINB12  --  287  FOLATE  --  18.2  FERRITIN  --  119  TIBC  --  197*  IRON  --  56  RETICCTPCT 1.7  --    Sepsis Labs: Recent Labs  Lab 09/16/23 0258 09/17/23 1141 09/17/23 1840 09/18/23 1045  PROCALCITON <0.10  --   --   --   LATICACIDVEN 1.1 1.1 3.8* 2.2*    Recent Results (from the past 240 hours)  Resp panel by RT-PCR (RSV, Flu A&B, Covid) Anterior Nasal Swab     Status: Abnormal   Collection Time: 09/15/23  6:03 PM   Specimen: Anterior Nasal Swab  Result Value Ref Range Status   SARS Coronavirus 2 by RT PCR POSITIVE (A) NEGATIVE Final   Influenza A by PCR NEGATIVE NEGATIVE Final   Influenza B by PCR NEGATIVE NEGATIVE Final    Comment: (NOTE) The Xpert Xpress SARS-CoV-2/FLU/RSV plus assay is intended as an aid in the diagnosis of influenza from Nasopharyngeal swab specimens and should not be used as a sole basis for treatment. Nasal washings and aspirates are unacceptable for Xpert Xpress SARS-CoV-2/FLU/RSV testing.  Fact Sheet for Patients: BloggerCourse.com  Fact Sheet for Healthcare Providers: SeriousBroker.it  This test is not yet approved or cleared by the [place]  FDA and has been authorized for detection and/or diagnosis of SARS-CoV-2 by FDA under an Emergency Use Authorization (EUA). This EUA will remain in effect (meaning this test can be used) for the duration of the COVID-19 declaration under Section 564(b)(1) of the Act, 21 U.S.C. section 360bbb-3(b)(1), unless the authorization is terminated or revoked.     Resp Syncytial Virus by PCR NEGATIVE NEGATIVE Final    Comment: (NOTE) Fact Sheet for Patients: BloggerCourse.com  Fact Sheet  for Healthcare Providers: SeriousBroker.it  This test is not yet approved or cleared by the [place]  FDA and has been authorized for detection and/or diagnosis of SARS-CoV-2 by FDA under an Emergency Use Authorization (EUA). This EUA will remain in effect (meaning this test can be used) for the duration of the COVID-19 declaration under Section 564(b)(1) of the Act, 21 U.S.C. section 360bbb-3(b)(1), unless the authorization is terminated or revoked.  Performed at [facility], [address], [place], [place] [address]          Radiology Studies: No results found.         LOS: 1 day   Time spent= 35 mins    Burgess JAYSON Dare, MD [facility]  If 7PM-7AM, please contact night-coverage  09/18/2023, 11:52 AM

## 2023-09-18 NOTE — Plan of Care (Signed)

## 2023-09-18 NOTE — Progress Notes (Signed)
 Physical Therapy Treatment Patient Details Name: PHILLIS THACKERAY MRN: [id number] DOB: [birth year] Today's Date: 09/18/2023   History of Present Illness Angie Carr is a 68 y.o. female who presented 9/14 with decreased p.o. intake nausea vomiting weight loss. Found to be COVID+. PMHx: DM2 , HTN, peripheral vascular disease history of PE currently on Eliquis , anxiety asthma hypertension neuromuscular disorder   PT Comments  Pt in bed upon arrival and agreeable to PT session. Pt improved in today's session by increasing gait distance to 52ft before needing a seated rest break. Pt continues to benefit from use of RW to off-load L LE and to improve gait symmetry. Able to perform ADLs at the sink with supervision and 1UE support on the sink. Pt is looking forward to going home and feels comfortable discharging home whenever medically stable. Continue to recommend HHPT with transition to OP PT. Acute PT to follow.    If plan is discharge home, recommend the following: A little help with walking and/or transfers;A little help with bathing/dressing/bathroom;Assistance with cooking/housework;Assist for transportation;Help with stairs or ramp for entrance   Can travel by private vehicle      Yes  Equipment Recommendations  None recommended by PT       Precautions / Restrictions Precautions Precautions: Fall Recall of Precautions/Restrictions: Intact Precaution/Restrictions Comments: No formal hip precautions per Op Note 07/24/23 Restrictions Weight Bearing Restrictions Per Provider Order: No     Mobility  Bed Mobility Overal bed mobility: Needs Assistance Bed Mobility: Supine to Sit    Supine to sit: Contact guard, HOB elevated, Used rails    General bed mobility comments: increased time and effort with pt using momentum for trunk raise    Transfers Overall transfer level: Needs assistance Equipment used: Rolling walker (2 wheels) Transfers: Sit to/from Stand Sit to Stand:  Contact guard assist, Min assist    General transfer comment: CGA from EOB and MinA for boost-up from lower commode height, cues to use rail and for foot placement    Ambulation/Gait Ambulation/Gait assistance: Contact guard assist Gait Distance (Feet): 60 Feet Assistive device: Rolling walker (2 wheels) Gait Pattern/deviations: Step-through pattern, Decreased stride length, Decreased stance time - left, Antalgic Gait velocity: reduced     General Gait Details: CGA for safety with slightly decreased weight-bearing on LLE     Balance Overall balance assessment: Needs assistance Sitting-balance support: Feet supported, No upper extremity supported Sitting balance-Leahy Scale: Fair     Standing balance support: During functional activity, No upper extremity supported Standing balance-Leahy Scale: Fair       Hotel manager: No apparent difficulties  Cognition Arousal: Alert Behavior During Therapy: WFL for tasks assessed/performed   PT - Cognitive impairments: No apparent impairments    Following commands: Intact      Cueing Cueing Techniques: Verbal cues  Exercises General Exercises - Lower Extremity Long Arc Quad: Strengthening, Both, 10 reps, Seated        Pertinent Vitals/Pain Pain Assessment Pain Assessment: No/denies pain     PT Goals (current goals can now be found in the care plan section) Acute Rehab PT Goals Patient Stated Goal: Return Home PT Goal Formulation: With patient Time For Goal Achievement: 09/30/23 Potential to Achieve Goals: Good Progress towards PT goals: Progressing toward goals    Frequency    Min 2X/week       AM-PAC PT 6 Clicks Mobility   Outcome Measure  Help needed turning from your back to your side while in a flat bed  without using bedrails?: None Help needed moving from lying on your back to sitting on the side of a flat bed without using bedrails?: A Little Help needed moving to and from a  bed to a chair (including a wheelchair)?: A Little Help needed standing up from a chair using your arms (e.g., wheelchair or bedside chair)?: A Little Help needed to walk in hospital room?: A Little Help needed climbing 3-5 steps with a railing? : A Little 6 Click Score: 19    End of Session   Activity Tolerance: Patient tolerated treatment well;Patient limited by fatigue Patient left: in chair;with call bell/phone within reach Nurse Communication: Mobility status PT Visit Diagnosis: Other abnormalities of gait and mobility (R26.89);Unsteadiness on feet (R26.81);Difficulty in walking, not elsewhere classified (R26.2)     Time: 9169-9142 PT Time Calculation (min) (ACUTE ONLY): 27 min  Charges:    $Gait Training: 8-22 mins $Therapeutic Activity: 8-22 mins PT General Charges $$ ACUTE PT VISIT: 1 Visit                    Kate ORN, PT, DPT Secure Chat Preferred  Rehab Office [phone]   Kate BRAVO Wendolyn 09/18/2023, 10:00 AM

## 2023-09-18 NOTE — Progress Notes (Signed)
 [facility] ROUNDING NOTE   Subjective: Patient reports feeling much better today.  She states that food is tasting good and she has not had any trouble swallowing today.  She underwent a barium swallow today which showed some nonspecific esophageal dysmotility, but was otherwise normal.  Barium tablet passed easily. She reports having one large semisolid stool in the morning and a smaller one later today. I was with her today as she ate her dinner which consisted of roast beef.  She did not have any difficulty swallowing or eating. CRP minimally elevated at 1.1.  Lactate level elevated although no evidence of acidosis.  Vitals stable, no abdominal pain   Objective: Vital signs in last 24 hours: Temp:  [97.5 F (36.4 C)-100.7 F (38.2 C)] 98.3 F (36.8 C) (09/17 1154) Pulse Rate:  [73-81] 76 (09/17 1154) Resp:  [16-20] 16 (09/17 1154) BP: (92-116)/(62-69) 100/62 (09/17 1154) SpO2:  [95 %-100 %] 100 % (09/17 1154) Last BM Date : 09/16/23 General: NAD, pleasant African American female, seated in bedside chair Lungs:  CTA b/l, no w/r/r Heart:  RRR, no m/r/g Abdomen:  Soft, NT, ND, +BS Ext:  No c/c/e    Intake/Output from previous day: 09/16 0701 - 09/17 0700 In: 620 [I.V.:620] Out: -  Intake/Output this shift: No intake/output data recorded.   Lab Results: Recent Labs    09/16/23 1304 09/17/23 0514 09/18/23 0751  WBC 5.3 4.4 4.3  HGB 10.6* 9.9* 9.7*  PLT 189 164 161  MCV 91.9 91.7 94.0   BMET Recent Labs    09/17/23 1141 09/17/23 2025 09/18/23 0751  NA 140 139 141  K 3.4* 3.6 4.3  CL 105 105 109  CO2 22 20* 23  GLUCOSE 83 127* 93  BUN 10 9 5*  CREATININE 0.75 0.76 0.66  CALCIUM  8.0* 7.8* 7.6*   LFT Recent Labs    09/16/23 1304  PROT 5.7*  ALBUMIN 2.7*  AST 19  ALT 15  ALKPHOS 56  BILITOT 1.4*   PT/INR No results for input(s): INR in the last 72 hours.    Imaging/Other results: DG ESOPHAGUS W SINGLE CM (SOL OR THIN BA) Result  Date: 09/18/2023 CLINICAL DATA:  68 year old female in dressing dysphagia. EXAM: ESOPHAGUS/BARIUM SWALLOW/TABLET STUDY TECHNIQUE: Single contrast examination was performed using thin liquid barium. This exam was performed by Delon Beagle NP and was supervised and interpreted by Dr. Davina Salvage . FLUOROSCOPY: Radiation Exposure Index (as provided by the fluoroscopic device): 1.80 mGy Kerma COMPARISON:  None Available. FINDINGS: Swallowing: Grossly unremarkable. Pharynx: Unremarkable. Esophagus: Normal appearance. Esophageal motility: Disruption of primary peristaltic waves in the distal esophagus on multiple swallows favoring nonspecific esophageal dysmotility disorder. There is some resulting stasis of contrast medium in the distal esophagus. Hiatal Hernia: None. Gastroesophageal reflux: None visualized. Ingested 13mm barium tablet: Passed normally Other: None. IMPRESSION: Limited study. Nonspecific esophageal dysmotility disorder, with disruption of primary peristaltic waves in the distal esophagus on multiple swallows. Electronically Signed   By: Ryan Salvage M.D.   On: 09/18/2023 15:45      Assessment and Plan:  68 year old female with diastolic heart failure, diabetes, PE, PAD, admitted with several weeks of diarrhea, poor PO intake, difficulty swallowing, nausea/vomiting and weight loss, found to be dehydrated with multiple electrolyte derangements.  Found to be COVID positive.  CT with questionable enteritis. She has shown much clinical improvement over the past 24-48 hours.  She was seen to be eating normally without n/v/dysphagia/dysgeusia and her diarrhea appears to have  nearly resolved.  Unclear etiology of her constellation of symptoms, but atypical infection/COVID certainly possible.   Given her clinical improvement, I don't there is a necessity for any invasive procedures.  Anticipate that her symptoms will continue to  improve/resolve.   Nausea/vomiting/anorexia/dysgeusia/dysphagia - Appears to be essentially resolved.  Barium swallow unremarkable except for nonspecific esophageal dysmotility, likely of limited clinical significance - May have been related to COVID infection  Diarrhea/dehydration - Also appears resolved.  Patient with semiformed stool today. - Electrolytes normal, normal renal function.  GI will sign off at this time.  Recommend patient follow up with outpatient Gastroenterology [facility]).  She will be due for screening colonoscopy next year.    Glendia FORBES Holt, MD  09/18/2023, 8:44 PM [facility]  Straight forward medical decision making (this includes chart review, review of results, face-to-face time used for counseling as well as treatment plan and follow-up. The patient was provided an opportunity to ask questions and all were answered. The patient agreed with the plan and demonstrated an understanding of the instructions

## 2023-09-19 ENCOUNTER — Other Ambulatory Visit (HOSPITAL_COMMUNITY): Payer: Self-pay

## 2023-09-19 LAB — BASIC METABOLIC PANEL WITH GFR
Anion gap: 12 (ref 5–15)
BUN: <5 mg/dL — ABNORMAL LOW (ref 8–23)
CO2: 23 mmol/L (ref 22–32)
Calcium: 7.9 mg/dL — ABNORMAL LOW (ref 8.9–10.3)
Chloride: 105 mmol/L (ref 98–111)
Creatinine, Ser: 0.6 mg/dL (ref 0.44–1.00)
GFR, Estimated: >60 mL/min (ref >60)
Glucose, Bld: 80 mg/dL (ref 70–99)
Potassium: 3.8 mmol/L (ref 3.5–5.1)
Sodium: 140 mmol/L (ref 135–145)

## 2023-09-19 LAB — CBC
HCT: 29.6 % — ABNORMAL LOW (ref 36.0–46.0)
Hemoglobin: 9.5 g/dL — ABNORMAL LOW (ref 12.0–15.0)
MCH: 30.4 pg (ref 26.0–34.0)
MCHC: 32.1 g/dL (ref 30.0–36.0)
MCV: 94.9 fL (ref 80.0–100.0)
Platelets: 157 K/uL (ref 150–400)
RBC: 3.12 MIL/uL — ABNORMAL LOW (ref 3.87–5.11)
RDW: 16.7 % — ABNORMAL HIGH (ref 11.5–15.5)
WBC: 5.1 K/uL (ref 4.0–10.5)
nRBC: 0 % (ref 0.0–0.2)

## 2023-09-19 LAB — GLUCOSE, CAPILLARY
Glucose-Capillary: 82 mg/dL (ref 70–99)
Glucose-Capillary: 87 mg/dL (ref 70–99)
Glucose-Capillary: 88 mg/dL (ref 70–99)

## 2023-09-19 LAB — MAGNESIUM: Magnesium: 1.2 mg/dL — ABNORMAL LOW (ref 1.7–2.4)

## 2023-09-19 LAB — CALCIUM, IONIZED: Calcium, Ionized, Serum: 4.6 mg/dL (ref 4.5–5.6)

## 2023-09-19 MED ORDER — MAGNESIUM OXIDE 400 MG PO TABS
400.0000 mg | ORAL_TABLET | Freq: Two times a day (BID) | ORAL | 0 refills | Status: AC
  Filled 2023-09-19: qty 4, 2d supply, fill #0

## 2023-09-19 MED ORDER — MAGNESIUM OXIDE -MG SUPPLEMENT 400 (240 MG) MG PO TABS
800.0000 mg | ORAL_TABLET | Freq: Once | ORAL | Status: AC
  Administered 2023-09-19: 800 mg via ORAL
  Filled 2023-09-19: qty 2

## 2023-09-19 MED ORDER — ORAL CARE MOUTH RINSE: 15.0000 mL | OROMUCOSAL | Status: DC | PRN

## 2023-09-19 MED ORDER — MAGNESIUM SULFATE 4 GM/100ML IV SOLN
4.0000 g | Freq: Once | INTRAVENOUS | Status: AC
  Administered 2023-09-19: 4 g via INTRAVENOUS
  Filled 2023-09-19 (×2): qty 100

## 2023-09-19 NOTE — TOC Transition Note (Signed)
 Transition of Care [facility]) - Discharge Note   Patient Details  Name: Angie Carr MRN: [id number] Date of Birth: 11-06-1955  Transition of Care [facility]) CM/SW Contact:  Andrez JULIANNA George, RN Phone Number: 09/19/2023, 10:00 AM   Clinical Narrative:     Pt is discharging home with home health services through [place]. Information in on the AVS.  Pt has arranged for [facility] transportation home. They will pick her up at 1:30 pm.    Final next level of care: Home w Home Health Services Barriers to Discharge: No Barriers Identified   Patient Goals and CMS Choice Patient states their goals for this hospitalization and ongoing recovery are:: return home and get stronger, recover   Choice offered to / list presented to : Patient      Discharge Placement                       Discharge Plan and Services Additional resources added to the After Visit Summary for     Discharge Planning Services: CM Consult Post Acute Care Choice: Home Health          DME Arranged: N/A DME Agency: NA       HH Arranged: PT, OT, Nurse's Aide HH Agency: [facility] Date [facility] Agency Contacted: 09/17/23 Time HH Agency Contacted: [id number] Representative spoke with at [facility] Agency: Amy  Social Drivers of Health (SDOH) Interventions SDOH Screenings   Food Insecurity: No Food Insecurity (09/17/2023)  Housing: Low Risk  (09/17/2023)  Transportation Needs: No Transportation Needs (09/17/2023)  Utilities: Not At Risk (09/17/2023)  Depression (PHQ2-9): Low Risk  (09/10/2023)  Social Connections: Moderately Integrated (09/17/2023)  Tobacco Use: High Risk (09/15/2023)     Readmission Risk Interventions    09/17/2023    3:57 PM 08/29/2023    2:12 PM  Readmission Risk Prevention Plan  Transportation Screening Complete Complete  PCP or Specialist Appt within 5-7 Days  Complete  Home Care Screening Complete Complete  Medication Review (RN CM) Complete Referral to Pharmacy

## 2023-09-19 NOTE — Progress Notes (Signed)
 Patient discharge home.  IV Magnesium  given prior to discharge.  IV and telemetry discontinued.  AVS discussed.  Patient alert and oriented,  appeared comfortable,  denies pain upon discharge.

## 2023-09-19 NOTE — Discharge Summary (Signed)
 Physician Discharge Summary  YUMA BLUCHER FMW:[id number] DOB: December 10, 1955 DOA: 09/15/2023  PCP: Benjamine Aland, MD  Admit date: 09/15/2023 Discharge date: 09/19/2023  Admitted From: Home Disposition: Home  Recommendations for Outpatient Follow-up:  Follow up with PCP in 1-2 weeks Please obtain BMP/CBC in magnesium  level at next 3-5 days 2 more days of p.o. magnesium    Discharge Condition: Stable CODE STATUS: Full code Diet recommendation: Diabetic  Brief/Interim Summary: Brief Narrative:   68 year old with history of DM2, HTN, PVD PE on Eliquis , asthma, HTN, neuromuscular disorder comes to the hospital with nausea, vomiting, weight loss.  She was noted to have COVID-19 infection with multiple electrolyte normalities and dehydration.  Electrolytes were aggressively repleted, received IV fluids during the hospitalization.  She started slowly improving.  There is also some concerns of dysphagia therefore GI is following.  Barium swallow overall unremarkable except nonspecific esophageal dysmotility.  No further workup indicated.  She will be discharged in stable condition with repeat blood work early next week for routine labs and magnesium  level.  Assessment & Plan:   Nausea/vomiting and weight loss COVID-19 infection - CT abdomen pelvis shows possible small degree of enteritis.  Nausea vomiting could be secondary to COVID-19 infection.  Continue supportive care at this time. tolerating orals without any issues  Dehydration Hypokalemia/hypocalcemia/hypophosphatemia/hypomagnesemia - Aggressive repletion of electrolytes.  She will be given 2 more days of p.o. magnesium  upon discharge.  Advised to repeat blood work with PCP next week.  Anion gap acidosis - Lactic acid slowly improving.  VBG is normal.  Weight loss/protein calorie malnutrition Dysphagia? - Seen by [facility], barium swallow overall unremarkable except nonspecific esophageal dysmotility.  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 intake and weight loss.  Currently diet controlled  Hyperlipidemia - Statin  Peripheral vascular disease - Moderate disease of left lower extremity.  No evidence of severe claudication at this time.  Outpatient follow-up  Congestive heart failure with preserved EF, 60% - Recent echocardiogram showed preserved EF, grade 1 DD  DVT prophylaxis: SCDs Start: 09/16/23 0851Eliquis    Code Status: Full Code Family Communication:   Status is: Inpatient Remains inpatient appropriate because: Electrolyte repletion, if improved later today she may build to go home.  PT Follow up Recs: HH, F2F completed.   Subjective: Wishing to go home today after some magnesium  repletion.  Examination:  General exam: Appears calm and comfortable  Respiratory system: Clear to auscultation. Respiratory effort normal. Cardiovascular system: S1 & S2 heard, RRR. No JVD, murmurs, rubs, gallops or clicks. No pedal edema. Gastrointestinal system: Abdomen is nondistended, soft and nontender. No organomegaly or masses felt. Normal bowel sounds heard. Central nervous system: Alert and oriented. No focal neurological deficits. Extremities: Symmetric 5 x 5 power. Skin: No rashes, lesions or ulcers Psychiatry: Judgement and insight appear normal. Mood & affect appropriate.    Discharge Diagnoses:  Principal Problem:   Dehydration Active Problems:   COVID-19   Diabetes mellitus, type 2 (HCC)   Hyperlipidemia LDL goal <70   Tobacco abuse   Acute kidney injury (HCC)   Absolute anemia   Peripheral arterial disease (HCC)   Pulmonary emboli (HCC)   Hypomagnesemia   Protein calorie malnutrition (HCC)   Uncontrolled type 2 diabetes mellitus with hypoglycemia, without long-term current use of insulin  (HCC)   Dysgeusia   Nausea and vomiting      Discharge Exam: Vitals:   09/19/23 0730 09/19/23 1112  BP: (!) 89/55 107/66   Pulse: 74 77  Resp: 16 16  Temp: 98.6 F (37 C) 99 F (37.2 C)  SpO2: 97% 100%   Vitals:   09/19/23 0016 09/19/23 0324 09/19/23 0730 09/19/23 1112  BP: 107/66 101/64 (!) 89/55 107/66  Pulse: 71 72 74 77  Resp: 18 18 16 16   Temp: 98.2 F (36.8 C) 98.9 F (37.2 C) 98.6 F (37 C) 99 F (37.2 C)  TempSrc: Oral Axillary Oral Oral  SpO2: 97% 96% 97% 100%  Weight:      Height:          Discharge Instructions   Allergies as of 09/19/2023   No Known Allergies      Medication List     TAKE these medications    cilostazol  50 MG tablet Commonly known as: PLETAL  Take 50 mg by mouth 2 (two) times daily.   diphenoxylate-atropine 2.5-0.025 MG tablet Commonly known as: LOMOTIL Take 1 tablet by mouth 4 (four) times daily as needed for diarrhea or loose stools.   Eliquis  5 MG Tabs tablet Generic drug: apixaban  Take 2 tablets (10 mg total) by mouth 2 (two) times daily for 7 days, THEN 1 tablet (5 mg total) 2 (two) times daily. Start taking on: 69 2025   fesoterodine  4 MG Tb24 tablet Commonly known as: TOVIAZ  Take 4 mg by mouth daily.   gabapentin  400 MG capsule Commonly known as: NEURONTIN  Take 400 mg by mouth See admin instructions. Take 1 capsule (400 mg) by mouth in the morning & take 2 capsules (800 mg) by mouth in the evening.   magnesium  oxide 400 MG tablet Commonly known as: MAG-OX Take 1 tablet (400 mg total) by mouth 2 (two) times daily for 2 days. Start taking on: September 20, 2023   metFORMIN  1000 MG tablet Commonly known as: GLUCOPHAGE  Take 1 tablet (1,000 mg total) by mouth 2 (two) times daily with a meal.   mirtazapine  7.5 MG tablet Commonly known as: REMERON  Take 7.5 mg by mouth daily.   nebivolol  5 MG tablet Commonly known as: BYSTOLIC  Take 5 mg by mouth daily.   pantoprazole  40 MG tablet Commonly known as: PROTONIX  Take 1 tablet (40 mg total) by mouth daily.   Potassium Chloride  ER 20 MEQ Tbcr Take 20 mEq by mouth in the  morning.   pravastatin  40 MG tablet Commonly known as: PRAVACHOL  Take 40 mg by mouth daily in the afternoon.        Follow-up Information     [facility] Follow up.   Why: Jamee) HHPT/OT arranged- they will contact you to schedule Contact information: [address] [place] [address] [phone]                No Known Allergies  You were cared for by a hospitalist during your hospital stay. If you have any questions about your discharge medications or the care you received while you were in the hospital after you are discharged, you can call the unit and asked to speak with the hospitalist on call if the hospitalist that took care of you is not available. Once you are discharged, your primary care physician will handle any further medical issues. Please note that no refills for any discharge medications will be authorized once you are discharged, as it is imperative that you return to your primary care physician (or establish a relationship with a primary care physician if you do not have one)  for your aftercare needs so that they can reassess your need for medications and monitor your lab values.  You were cared for by a hospitalist during your hospital stay. If you have any questions about your discharge medications or the care you received while you were in the hospital after you are discharged, you can call the unit and asked to speak with the hospitalist on call if the hospitalist that took care of you is not available. Once you are discharged, your primary care physician will handle any further medical issues. Please note that NO REFILLS for any discharge medications will be authorized once you are discharged, as it is imperative that you return to your primary care physician (or establish a relationship with a primary care physician if you do not have one) for your aftercare needs so that they can reassess your need for medications and monitor  your lab values.  Please request your Prim.MD to go over all Hospital Tests and Procedure/Radiological results at the follow up, please get all Hospital records sent to your Prim MD by signing hospital release before you go home.  Get CBC, CMP, 2 view Chest X ray checked  by Primary MD during your next visit or SNF MD in 5-7 days ( we routinely change or add medications that can affect your baseline labs and fluid status, therefore we recommend that you get the mentioned basic workup next visit with your PCP, your PCP may decide not to get them or add new tests based on their clinical decision)  On your next visit with your primary care physician please Get Medicines reviewed and adjusted.  If you experience worsening of your admission symptoms, develop shortness of breath, life threatening emergency, suicidal or homicidal thoughts you must seek medical attention immediately by calling 911 or calling your MD immediately  if symptoms less severe.  You Must read complete instructions/literature along with all the possible adverse reactions/side effects for all the Medicines you take and that have been prescribed to you. Take any new Medicines after you have completely understood and accpet all the possible adverse reactions/side effects.   Do not drive, operate heavy machinery, perform activities at heights, swimming or participation in water activities or provide baby sitting services if your were admitted for syncope or siezures until you have seen by Primary MD or a Neurologist and advised to do so again.  Do not drive when taking Pain medications.   Procedures/Studies: DG ESOPHAGUS W SINGLE CM (SOL OR THIN BA) Result Date: 09/18/2023 CLINICAL DATA:  68 year old female in dressing dysphagia. EXAM: ESOPHAGUS/BARIUM SWALLOW/TABLET STUDY TECHNIQUE: Single contrast examination was performed using thin liquid barium. This exam was performed by Delon Beagle NP and was supervised and interpreted  by Dr. Davina Salvage . FLUOROSCOPY: Radiation Exposure Index (as provided by the fluoroscopic device): 1.80 mGy Kerma COMPARISON:  None Available. FINDINGS: Swallowing: Grossly unremarkable. Pharynx: Unremarkable. Esophagus: Normal appearance. Esophageal motility: Disruption of primary peristaltic waves in the distal esophagus on multiple swallows favoring nonspecific esophageal dysmotility disorder. There is some resulting stasis of contrast medium in the distal esophagus. Hiatal Hernia: None. Gastroesophageal reflux: None visualized. Ingested 13mm barium tablet: Passed normally Other: None. IMPRESSION: Limited study. Nonspecific esophageal dysmotility disorder, with disruption of primary peristaltic waves in the distal esophagus on multiple swallows. Electronically Signed   By: Ryan Salvage M.D.   On: 09/18/2023 15:45   VAS US  ABI WITH/WO TBI Result Date: 09/16/2023  LOWER EXTREMITY DOPPLER STUDY Patient Name:  Mercia Dowe Rympdupjw  Date of Exam:   09/16/2023 Medical Rec #: [id number]           Accession #:    [id number] Date of Birth: 02-Apr-1955           Patient Gender: F Patient Age:   49 years Exam Location:  [facility] Procedure:      VAS US  ABI WITH/WO TBI Referring Phys: ANASTASSIA DOUTOVA --------------------------------------------------------------------------------  Indications: Peripheral artery disease. High Risk Factors: Hypertension, hyperlipidemia, Diabetes, current smoker.  Comparison Study: No prior exam. Performing Technologist: Edilia Elden Appl  Examination Guidelines: A complete evaluation includes at minimum, Doppler waveform signals and systolic blood pressure reading at the level of bilateral brachial, anterior tibial, and posterior tibial arteries, when vessel segments are accessible. Bilateral testing is considered an integral part of a complete examination. Photoelectric Plethysmograph (PPG) waveforms and toe systolic pressure readings are included as required and  additional duplex testing as needed. Limited examinations for reoccurring indications may be performed as noted.  ABI Findings: +---------+------------------+-----+---------+--------+ Right    Rt Pressure (mmHg)IndexWaveform Comment  +---------+------------------+-----+---------+--------+ Brachial 131                    triphasic         +---------+------------------+-----+---------+--------+ PTA      125               0.95 triphasic         +---------+------------------+-----+---------+--------+ DP       131               1.00 triphasic         +---------+------------------+-----+---------+--------+ Great Toe108               0.82 Normal            +---------+------------------+-----+---------+--------+ +---------+------------------+-----+---------+-------+ Left     Lt Pressure (mmHg)IndexWaveform Comment +---------+------------------+-----+---------+-------+ Brachial 126                    triphasic        +---------+------------------+-----+---------+-------+ PTA      92                0.70 biphasic         +---------+------------------+-----+---------+-------+ DP       84                0.64 biphasic         +---------+------------------+-----+---------+-------+ Great Toe64                0.49 Normal           +---------+------------------+-----+---------+-------+ +-------+-----------+-----------+------------+------------+ ABI/TBIToday's ABIToday's TBIPrevious ABIPrevious TBI +-------+-----------+-----------+------------+------------+ Right  1          0.82                                +-------+-----------+-----------+------------+------------+ Left   0.70       0.49                                +-------+-----------+-----------+------------+------------+  Summary: Right: Resting right ankle-brachial index is within normal range. The right toe-brachial index is normal.  Left: Resting left ankle-brachial index indicates moderate left  lower extremity arterial disease. The left toe-brachial index is abnormal.  *See table(s) above for measurements and observations.  Electronically signed by Debby Robertson on 09/16/2023 at 11:36:11 AM.  Final    DG CHEST PORT 1 VIEW Result Date: 09/16/2023 CLINICAL DATA:  Dehydration EXAM: PORTABLE CHEST 1 VIEW COMPARISON:  12/13/2014 FINDINGS: Cardiac shadows within normal limits. Aortic calcifications are noted. The lungs are well aerated bilaterally. No focal infiltrate or effusion is seen. No bony abnormality is noted. IMPRESSION: No acute abnormality noted. Electronically Signed   By: Oneil Devonshire M.D.   On: 09/16/2023 00:50   CT ABDOMEN PELVIS W CONTRAST Result Date: 09/15/2023 CLINICAL DATA:  Right lower quadrant pain EXAM: CT ABDOMEN AND PELVIS WITH CONTRAST TECHNIQUE: Multidetector CT imaging of the abdomen and pelvis was performed using the standard protocol following bolus administration of intravenous contrast. RADIATION DOSE REDUCTION: This exam was performed according to the departmental dose-optimization program which includes automated exposure control, adjustment of the mA and/or kV according to patient size and/or use of iterative reconstruction technique. CONTRAST:  75mL OMNIPAQUE  IOHEXOL  350 MG/ML SOLN COMPARISON:  08/28/2023 FINDINGS: Lower chest: Lung bases are free of acute infiltrate or sizable effusion. No incidental pulmonary emboli seen. Hepatobiliary: No focal liver abnormality is seen. Status post cholecystectomy. No biliary dilatation. Pancreas: Unremarkable. No pancreatic ductal dilatation or surrounding inflammatory changes. Spleen: Normal in size without focal abnormality. Adrenals/Urinary Tract: Adrenal glands are within normal limits. Kidneys demonstrate a normal enhancement pattern bilaterally. No renal calculi or obstructive changes are seen. The bladder is decompressed. Stomach/Bowel: Diverticular change of the colon is noted without evidence of diverticulitis. This is  stable from the prior exam. Transverse colon again extends into a ventral hernia similar to that noted on the prior study. A small fat containing hernia is noted just below and to the right of this more superior ventral hernia. The proximal colon is within normal limits. The appendix is unremarkable. Small bowel and stomach are mildly prominent without true obstruction which may represent a degree of enteritis. Vascular/Lymphatic: Aortic atherosclerosis. No enlarged abdominal or pelvic lymph nodes. Reproductive: Status post hysterectomy. No adnexal masses. Other: No free fluid is noted. A fat containing periumbilical hernia is noted on the right similar to that seen on the prior exam. A small ventral hernia is noted just superior to that containing a portion of the transverse colon without obstructive change. The overall appearance is stable. Musculoskeletal: Left hip replacement is noted. Degenerative changes of the right hip joint and lumbar spine are noted. IMPRESSION: Mild small bowel prominence centrally although no true obstructive changes are seen. This may represent a degree of enteritis. Diverticulosis without diverticulitis. Normal-appearing appendix. Stable hernias as described above. Electronically Signed   By: Oneil Devonshire M.D.   On: 09/15/2023 22:42   ECHOCARDIOGRAM COMPLETE Result Date: 08/30/2023    ECHOCARDIOGRAM REPORT   Patient Name:   SAYURI RHAMES Date of Exam: 08/30/2023 Medical Rec #:  [id number]          Height:       64.0 in Accession #:    [id number]         Weight:       231.0 lb Date of Birth:  [birth year]          BSA:          2.080 m Patient Age:    68 years           BP:           108/70 mmHg Patient Gender: F                  HR:  80 bpm. Exam Location:  Inpatient Procedure: 2D Echo, 3D Echo, Color Doppler, Cardiac Doppler and Strain Analysis            (Both Spectral and Color Flow Doppler were utilized during            procedure). Indications:    Pulmonary Embolus  I26.09  History:        Patient has prior history of Echocardiogram examinations, most                 recent 12/12/2014. PAD; Risk Factors:Dyslipidemia, Diabetes,                 Hypertension and Current Smoker.  Sonographer:    Koleen Popper RDCS Referring Phys: [id number] REDIA SAILOR [facility]  Sonographer Comments: Global longitudinal strain was attempted. IMPRESSIONS  1. Left ventricular ejection fraction, by estimation, is 55 to 60%. Left ventricular ejection fraction by 3D volume is 57 %. The left ventricle has normal function. The left ventricle has no regional wall motion abnormalities. There is moderate concentric left ventricular hypertrophy. Left ventricular diastolic parameters are consistent with Grade I diastolic dysfunction (impaired relaxation).  2. Right ventricular systolic function is normal. The right ventricular size is normal. There is normal pulmonary artery systolic pressure. The estimated right ventricular systolic pressure is 27.7 mmHg.  3. The mitral valve is degenerative. Trivial mitral valve regurgitation.  4. The aortic valve is tricuspid. Aortic valve regurgitation is mild. Aortic valve sclerosis/calcification is present, without any evidence of aortic stenosis.  5. Aortic dilatation noted. There is mild dilatation of the ascending aorta, measuring 41 mm.  6. The inferior vena cava is dilated in size with >50% respiratory variability, suggesting right atrial pressure of 8 mmHg. FINDINGS  Left Ventricle: Left ventricular ejection fraction, by estimation, is 55 to 60%. Left ventricular ejection fraction by 3D volume is 57 %. The left ventricle has normal function. The left ventricle has no regional wall motion abnormalities. The left ventricular internal cavity size was normal in size. There is moderate concentric left ventricular hypertrophy. Left ventricular diastolic parameters are consistent with Grade I diastolic dysfunction (impaired relaxation). Indeterminate filling pressures. Right  Ventricle: The right ventricular size is normal. No increase in right ventricular wall thickness. Right ventricular systolic function is normal. There is normal pulmonary artery systolic pressure. The tricuspid regurgitant velocity is 2.22 m/s, and  with an assumed right atrial pressure of 8 mmHg, the estimated right ventricular systolic pressure is 27.7 mmHg. Left Atrium: Left atrial size was normal in size. Right Atrium: Right atrial size was normal in size. Pericardium: There is no evidence of pericardial effusion. Mitral Valve: The mitral valve is degenerative in appearance. Mild to moderate mitral annular calcification. Trivial mitral valve regurgitation. Tricuspid Valve: The tricuspid valve is grossly normal. Tricuspid valve regurgitation is trivial. Aortic Valve: The aortic valve is tricuspid. Aortic valve regurgitation is mild. Aortic valve sclerosis/calcification is present, without any evidence of aortic stenosis. Pulmonic Valve: The pulmonic valve was normal in structure. Pulmonic valve regurgitation is not visualized. Aorta: Aortic dilatation noted. There is mild dilatation of the ascending aorta, measuring 41 mm. Venous: The inferior vena cava is dilated in size with greater than 50% respiratory variability, suggesting right atrial pressure of 8 mmHg. IAS/Shunts: No atrial level shunt detected by color flow Doppler.  LEFT VENTRICLE PLAX 2D LVIDd:         3.90 cm         Diastology LVIDs:  2.00 cm         LV e' medial:    7.51 cm/s LV PW:         1.40 cm         LV E/e' medial:  17.8 LV IVS:        1.30 cm         LV e' lateral:   6.09 cm/s LVOT diam:     1.50 cm         LV E/e' lateral: 22.0 LV SV:         52 LV SV Index:   25 LVOT Area:     1.77 cm        3D Volume EF                                LV 3D EF:    Left                                             ventricul                                             ar                                             ejection                                              fraction                                             by 3D                                             volume is                                             57 %.                                 3D Volume EF:                                3D EF:        57 %                                LV EDV:       102 ml  LV ESV:       44 ml                                LV SV:        58 ml RIGHT VENTRICLE             IVC RV Basal diam:  2.90 cm     IVC diam: 2.10 cm RV S prime:     21.40 cm/s TAPSE (M-mode): 2.3 cm LEFT ATRIUM             Index        RIGHT ATRIUM           Index LA diam:        3.10 cm 1.49 cm/m   RA Area:     11.10 cm LA Vol (A2C):   70.8 ml 34.04 ml/m  RA Volume:   22.30 ml  10.72 ml/m LA Vol (A4C):   52.4 ml 25.19 ml/m LA Biplane Vol: 65.2 ml 31.35 ml/m  AORTIC VALVE LVOT Vmax:   140.00 cm/s LVOT Vmean:  93.900 cm/s LVOT VTI:    0.293 m  AORTA Ao Root diam: 3.20 cm Ao Asc diam:  4.10 cm MITRAL VALVE                TRICUSPID VALVE MV Area (PHT): 3.53 cm     TR Peak grad:   19.7 mmHg MV Decel Time: 215 msec     TR Vmax:        222.00 cm/s MV E velocity: 134.00 cm/s MV A velocity: 156.00 cm/s  SHUNTS MV E/A ratio:  0.86         Systemic VTI:  0.29 m                             Systemic Diam: 1.50 cm Vinie Maxcy MD Electronically signed by Vinie Maxcy MD Signature Date/Time: 08/30/2023/12:50:21 PM    Final    CT ABDOMEN PELVIS W CONTRAST Result Date: 08/28/2023 CLINICAL DATA:  Positive D-dimer and shortness of breath cough initial encounter EXAM: CT ANGIOGRAPHY CHEST CT ABDOMEN AND PELVIS WITH CONTRAST TECHNIQUE: Multidetector CT imaging of the chest was performed using the standard protocol during bolus administration of intravenous contrast. Multiplanar CT image reconstructions and MIPs were obtained to evaluate the vascular anatomy. Multidetector CT imaging of the abdomen and pelvis was performed using the standard protocol during bolus  administration of intravenous contrast. RADIATION DOSE REDUCTION: This exam was performed according to the departmental dose-optimization program which includes automated exposure control, adjustment of the mA and/or kV according to patient size and/or use of iterative reconstruction technique. CONTRAST:  75mL OMNIPAQUE  IOHEXOL  350 MG/ML SOLN COMPARISON:  None Available. FINDINGS: CTA CHEST FINDINGS Cardiovascular: Dilatation of the ascending aorta to 4.1 cm is noted. No dissection is noted. Coronary calcifications are noted. No cardiac enlargement is seen. The pulmonary artery shows a normal branching pattern bilaterally. Filling defects are noted within the segmental and subsegmental vessels on the right predominately within right middle and lower lobes. Mediastinum/Nodes: Thoracic inlet is within normal limits. No hilar or mediastinal adenopathy is noted. The esophagus as visualized is within normal limits. Lungs/Pleura: Diffuse emphysematous changes are identified bilaterally. Lungs are well aerated without focal infiltrate or effusion. No sizable parenchymal nodules are seen. Musculoskeletal: Degenerative changes of the thoracic spine are noted. No acute rib  abnormality is seen. Review of the MIP images confirms the above findings. CT ABDOMEN and PELVIS FINDINGS Hepatobiliary: No focal liver abnormality is seen. Status post cholecystectomy. No biliary dilatation. Pancreas: Unremarkable. No pancreatic ductal dilatation or surrounding inflammatory changes. Spleen: Normal in size without focal abnormality. Adrenals/Urinary Tract: Adrenal glands are within normal limits. Kidneys demonstrate no renal calculi or obstructive changes. Bladder is decompressed. Stomach/Bowel: Scattered diverticular change of the colon is noted without evidence of diverticulitis. Minimal partial herniation anteriorly is noted of portion of the mid transverse colon loop. The appendix is within normal limits. Small bowel and stomach are  within normal limits. Vascular/Lymphatic: Aortic atherosclerosis. No enlarged abdominal or pelvic lymph nodes. Reproductive: Status post hysterectomy. No adnexal masses. Other: Ventral hernias are noted as described. One contains a portion of transverse colon without obstructive change. One contains fat. No abdominopelvic ascites. Musculoskeletal: Left hip prosthesis is noted. Degenerative changes of the right hip are noted. Lumbar spine degenerative changes are seen. No compression deformities are noted. Review of the MIP images confirms the above findings. IMPRESSION: CTA of the chest: Diffuse pulmonary arterial emboli within the right middle and right lower lobe branches. No definitive right heart strain is noted. CT of the abdomen and pelvis: Diverticulosis without diverticulitis. Ventral hernia containing a portion of a loop of transverse colon although no obstructive changes are noted. Additionally, a small fat containing right paraumbilical hernia is noted. Aortic Atherosclerosis (ICD10-I70.0) and Emphysema (ICD10-J43.9). Critical Value/emergent results were called by telephone at the time of interpretation on 08/28/2023 at 10:25 pm to Dr. LYNWOOD MOULDER , who verbally acknowledged these results. Electronically Signed   By: Oneil Devonshire M.D.   On: 08/28/2023 22:30   CT Angio Chest PE W and/or Wo Contrast Result Date: 08/28/2023 CLINICAL DATA:  Positive D-dimer and shortness of breath cough initial encounter EXAM: CT ANGIOGRAPHY CHEST CT ABDOMEN AND PELVIS WITH CONTRAST TECHNIQUE: Multidetector CT imaging of the chest was performed using the standard protocol during bolus administration of intravenous contrast. Multiplanar CT image reconstructions and MIPs were obtained to evaluate the vascular anatomy. Multidetector CT imaging of the abdomen and pelvis was performed using the standard protocol during bolus administration of intravenous contrast. RADIATION DOSE REDUCTION: This exam was performed according to  the departmental dose-optimization program which includes automated exposure control, adjustment of the mA and/or kV according to patient size and/or use of iterative reconstruction technique. CONTRAST:  75mL OMNIPAQUE  IOHEXOL  350 MG/ML SOLN COMPARISON:  None Available. FINDINGS: CTA CHEST FINDINGS Cardiovascular: Dilatation of the ascending aorta to 4.1 cm is noted. No dissection is noted. Coronary calcifications are noted. No cardiac enlargement is seen. The pulmonary artery shows a normal branching pattern bilaterally. Filling defects are noted within the segmental and subsegmental vessels on the right predominately within right middle and lower lobes. Mediastinum/Nodes: Thoracic inlet is within normal limits. No hilar or mediastinal adenopathy is noted. The esophagus as visualized is within normal limits. Lungs/Pleura: Diffuse emphysematous changes are identified bilaterally. Lungs are well aerated without focal infiltrate or effusion. No sizable parenchymal nodules are seen. Musculoskeletal: Degenerative changes of the thoracic spine are noted. No acute rib abnormality is seen. Review of the MIP images confirms the above findings. CT ABDOMEN and PELVIS FINDINGS Hepatobiliary: No focal liver abnormality is seen. Status post cholecystectomy. No biliary dilatation. Pancreas: Unremarkable. No pancreatic ductal dilatation or surrounding inflammatory changes. Spleen: Normal in size without focal abnormality. Adrenals/Urinary Tract: Adrenal glands are within normal limits. Kidneys demonstrate no renal calculi or obstructive changes.  Bladder is decompressed. Stomach/Bowel: Scattered diverticular change of the colon is noted without evidence of diverticulitis. Minimal partial herniation anteriorly is noted of portion of the mid transverse colon loop. The appendix is within normal limits. Small bowel and stomach are within normal limits. Vascular/Lymphatic: Aortic atherosclerosis. No enlarged abdominal or pelvic lymph  nodes. Reproductive: Status post hysterectomy. No adnexal masses. Other: Ventral hernias are noted as described. One contains a portion of transverse colon without obstructive change. One contains fat. No abdominopelvic ascites. Musculoskeletal: Left hip prosthesis is noted. Degenerative changes of the right hip are noted. Lumbar spine degenerative changes are seen. No compression deformities are noted. Review of the MIP images confirms the above findings. IMPRESSION: CTA of the chest: Diffuse pulmonary arterial emboli within the right middle and right lower lobe branches. No definitive right heart strain is noted. CT of the abdomen and pelvis: Diverticulosis without diverticulitis. Ventral hernia containing a portion of a loop of transverse colon although no obstructive changes are noted. Additionally, a small fat containing right paraumbilical hernia is noted. Aortic Atherosclerosis (ICD10-I70.0) and Emphysema (ICD10-J43.9). Critical Value/emergent results were called by telephone at the time of interpretation on 08/28/2023 at 10:25 pm to Dr. LYNWOOD MOULDER , who verbally acknowledged these results. Electronically Signed   By: Oneil Devonshire M.D.   On: 08/28/2023 22:30     The results of significant diagnostics from this hospitalization (including imaging, microbiology, ancillary and laboratory) are listed below for reference.     Microbiology: Recent Results (from the past 240 hours)  Resp panel by RT-PCR (RSV, Flu A&B, Covid) Anterior Nasal Swab     Status: Abnormal   Collection Time: 09/15/23  6:03 PM   Specimen: Anterior Nasal Swab  Result Value Ref Range Status   SARS Coronavirus 2 by RT PCR POSITIVE (A) NEGATIVE Final   Influenza A by PCR NEGATIVE NEGATIVE Final   Influenza B by PCR NEGATIVE NEGATIVE Final    Comment: (NOTE) The Xpert Xpress SARS-CoV-2/FLU/RSV plus assay is intended as an aid in the diagnosis of influenza from Nasopharyngeal swab specimens and should not be used as a sole  basis for treatment. Nasal washings and aspirates are unacceptable for Xpert Xpress SARS-CoV-2/FLU/RSV testing.  Fact Sheet for Patients: BloggerCourse.com  Fact Sheet for Healthcare Providers: SeriousBroker.it  This test is not yet approved or cleared by the [place]  FDA and has been authorized for detection and/or diagnosis of SARS-CoV-2 by FDA under an Emergency Use Authorization (EUA). This EUA will remain in effect (meaning this test can be used) for the duration of the COVID-19 declaration under Section 564(b)(1) of the Act, 21 U.S.C. section 360bbb-3(b)(1), unless the authorization is terminated or revoked.     Resp Syncytial Virus by PCR NEGATIVE NEGATIVE Final    Comment: (NOTE) Fact Sheet for Patients: BloggerCourse.com  Fact Sheet for Healthcare Providers: SeriousBroker.it  This test is not yet approved or cleared by the [place]  FDA and has been authorized for detection and/or diagnosis of SARS-CoV-2 by FDA under an Emergency Use Authorization (EUA). This EUA will remain in effect (meaning this test can be used) for the duration of the COVID-19 declaration under Section 564(b)(1) of the Act, 21 U.S.C. section 360bbb-3(b)(1), unless the authorization is terminated or revoked.  Performed at [facility], [address], [place], [place] [address]      Labs: BNP (last 3 results) No results for input(s): BNP in the last 8760 hours. Basic Metabolic Panel: Recent Labs  Lab 09/16/23 0258 09/16/23  9579 09/16/23 1304 09/17/23 0514 09/17/23 1141 09/17/23 2025 09/18/23 0751 09/19/23 0521  NA 136   < > 136 141 140 139 141 140  K 3.2*   < > 3.3* 2.9* 3.4* 3.6 4.3 3.8  CL 100  --  100 103 105 105 109 105  CO2 19*  --  26 20* 22 20* 23 23  GLUCOSE 70  --  84 76 83 127* 93 80  BUN 17  --  14 10 10 9  5* <5*  CREATININE 0.92  --  0.88 0.72 0.75 0.76  0.66 0.60  CALCIUM  7.8*  --  9.1 7.9* 8.0* 7.8* 7.6* 7.9*  MG  --   --  1.4* 1.2* 2.1  --  1.6* 1.2*  PHOS 2.0*  --  2.3* 4.5  --   --   --   --    < > = values in this interval not displayed.   Liver Function Tests: Recent Labs  Lab 09/15/23 1721 09/16/23 1304  AST 22 19  ALT 18 15  ALKPHOS 75 56  BILITOT 1.9* 1.4*  PROT 7.4 5.7*  ALBUMIN 3.6 2.7*   Recent Labs  Lab 09/15/23 1721  LIPASE 26   No results for input(s): AMMONIA in the last 168 hours. CBC: Recent Labs  Lab 09/16/23 0258 09/16/23 0420 09/16/23 1304 09/17/23 0514 09/18/23 0751 09/19/23 0521  WBC 3.3*  --  5.3 4.4 4.3 5.1  NEUTROABS 1.4*  --   --   --   --   --   HGB 10.6* 10.9* 10.6* 9.9* 9.7* 9.5*  HCT 32.8* 32.0* 31.6* 28.9* 29.6* 29.6*  MCV 94.3  --  91.9 91.7 94.0 94.9  PLT 181  --  189 164 161 157   Cardiac Enzymes: Recent Labs  Lab 09/16/23 0258  CKTOTAL 40   BNP: Invalid input(s): POCBNP CBG: Recent Labs  Lab 09/18/23 1150 09/18/23 2055 09/19/23 0605 09/19/23 0726 09/19/23 1109  GLUCAP 108* 104* 82 87 88   D-Dimer No results for input(s): DDIMER in the last 72 hours. Hgb A1c No results for input(s): HGBA1C in the last 72 hours. Lipid Profile No results for input(s): CHOL, HDL, LDLCALC, TRIG, CHOLHDL, LDLDIRECT in the last 72 hours. Thyroid function studies No results for input(s): TSH, T4TOTAL, T3FREE, THYROIDAB in the last 72 hours.  Invalid input(s): FREET3 Anemia work up No results for input(s): VITAMINB12, FOLATE, FERRITIN, TIBC, IRON, RETICCTPCT in the last 72 hours. Urinalysis    Component Value Date/Time   COLORURINE AMBER (A) 09/15/2023 2249   APPEARANCEUR HAZY (A) 09/15/2023 2249   LABSPEC 1.027 09/15/2023 2249   PHURINE 6.0 09/15/2023 2249   GLUCOSEU NEGATIVE 09/15/2023 2249   HGBUR MODERATE (A) 09/15/2023 2249   BILIRUBINUR SMALL (A) 09/15/2023 2249   KETONESUR 80 (A) 09/15/2023 2249   PROTEINUR 100 (A) 09/15/2023  2249   NITRITE NEGATIVE 09/15/2023 2249   LEUKOCYTESUR TRACE (A) 09/15/2023 2249   Sepsis Labs Recent Labs  Lab 09/16/23 1304 09/17/23 0514 09/18/23 0751 09/19/23 0521  WBC 5.3 4.4 4.3 5.1   Microbiology Recent Results (from the past 240 hours)  Resp panel by RT-PCR (RSV, Flu A&B, Covid) Anterior Nasal Swab     Status: Abnormal   Collection Time: 09/15/23  6:03 PM   Specimen: Anterior Nasal Swab  Result Value Ref Range Status   SARS Coronavirus 2 by RT PCR POSITIVE (A) NEGATIVE Final   Influenza A by PCR NEGATIVE NEGATIVE Final   Influenza B by PCR NEGATIVE  NEGATIVE Final    Comment: (NOTE) The Xpert Xpress SARS-CoV-2/FLU/RSV plus assay is intended as an aid in the diagnosis of influenza from Nasopharyngeal swab specimens and should not be used as a sole basis for treatment. Nasal washings and aspirates are unacceptable for Xpert Xpress SARS-CoV-2/FLU/RSV testing.  Fact Sheet for Patients: BloggerCourse.com  Fact Sheet for Healthcare Providers: SeriousBroker.it  This test is not yet approved or cleared by the [place]  FDA and has been authorized for detection and/or diagnosis of SARS-CoV-2 by FDA under an Emergency Use Authorization (EUA). This EUA will remain in effect (meaning this test can be used) for the duration of the COVID-19 declaration under Section 564(b)(1) of the Act, 21 U.S.C. section 360bbb-3(b)(1), unless the authorization is terminated or revoked.     Resp Syncytial Virus by PCR NEGATIVE NEGATIVE Final    Comment: (NOTE) Fact Sheet for Patients: BloggerCourse.com  Fact Sheet for Healthcare Providers: SeriousBroker.it  This test is not yet approved or cleared by the [place]  FDA and has been authorized for detection and/or diagnosis of SARS-CoV-2 by FDA under an Emergency Use Authorization (EUA). This EUA will remain in effect (meaning this  test can be used) for the duration of the COVID-19 declaration under Section 564(b)(1) of the Act, 21 U.S.C. section 360bbb-3(b)(1), unless the authorization is terminated or revoked.  Performed at [facility], [address], [place], [place] [address]      Time coordinating discharge:  I have spent 35 minutes face to face with the patient and on the ward discussing the patients care, assessment, plan and disposition with other care givers. >50% of the time was devoted counseling the patient about the risks and benefits of treatment/Discharge disposition and coordinating care.   SIGNED:   Burgess JAYSON Dare, MD  [facility] 09/19/2023, 11:31 AM   If 7PM-7AM, please contact night-coverage

## 2023-09-19 NOTE — Plan of Care (Signed)

## 2023-09-20 ENCOUNTER — Telehealth: Payer: Self-pay

## 2023-09-20 LAB — ZINC: Zinc: 52 ug/dL (ref 44–115)

## 2023-09-20 LAB — VITAMIN B1: Vitamin B1 (Thiamine): 73.9 nmol/L (ref 66.5–200.0)

## 2023-09-20 NOTE — Transitions of Care (Post Inpatient/ED Visit) (Signed)
 09/20/2023  Name: Angie Carr MRN: [id number] DOB: 29-Mar-1955  Today's TOC FU Call Status: Today's TOC FU Call Status:: Successful TOC FU Call Completed TOC FU Call Complete Date: 09/20/23 Patient's Name and Date of Birth confirmed.  Transition Care Management Follow-up Telephone Call Date of Discharge: 09/19/23 Discharge Facility: [facility]) Primary Inpatient Discharge Diagnosis:: Dehydration secondary to COVID - 19 How have you been since you were released from the hospital?: Better Any questions or concerns?: No  Items Reviewed: Did you receive and understand the discharge instructions provided?: Yes Any new allergies since your discharge?: No Dietary orders reviewed?: Yes Type of Diet Ordered:: Heart Healthy Do you have support at home?: Yes People in Home [RPT]: grandchild(ren), sibling(s) Name of Support/Comfort Primary Source: sister is contact person - Ronal Spurling  Medications Reviewed Today: Medications Reviewed Today     Reviewed by Eilleen Richerd GRADE, RN (Registered Nurse) on 09/20/23 at 1137  Med List Status: <None>   Medication Order Taking? Sig Documenting Provider Last Dose Status Informant  apixaban  (ELIQUIS ) 5 MG TABS tablet [id number] Yes Take 2 tablets (10 mg total) by mouth 2 (two) times daily for 7 days, THEN 1 tablet (5 mg total) 2 (two) times daily. Willette Adriana LABOR, MD  Active   cilostazol  (PLETAL ) 50 MG tablet [id number] Yes Take 50 mg by mouth 2 (two) times daily. [provider]  Active Self, Pharmacy Records           Med Note (WHITE, DONETA GORMAN Repress Sep 15, 2023 11:13 PM)    diphenoxylate-atropine (LOMOTIL) 2.5-0.025 MG tablet [id number]  Take 1 tablet by mouth 4 (four) times daily as needed for diarrhea or loose stools.  Patient not taking: Reported on 09/20/2023   [provider]  Active Self, Pharmacy Records           Med Note [facility], DONETA GORMAN Repress Sep 15, 2023 11:13 PM)    fesoterodine  (TOVIAZ ) 4 MG TB24 tablet  [id number] Yes Take 4 mg by mouth daily. [provider]  Active Self, Pharmacy Records           Med Note [facility], DONETA GORMAN Repress Sep 15, 2023 11:13 PM)    gabapentin  (NEURONTIN ) 400 MG capsule [id number] Yes Take 400 mg by mouth See admin instructions. Take 1 capsule (400 mg) by mouth in the morning & take 2 capsules (800 mg) by mouth in the evening. [provider]  Active Self, Pharmacy Records           Med Note [facility], DONETA GORMAN Repress Sep 15, 2023 11:14 PM)    magnesium  oxide (MAG-OX) 400 MG tablet [id number] Yes Take 1 tablet (400 mg total) by mouth 2 (two) times daily for 2 days. Caleen Burgess BROCKS, MD  Active   metFORMIN  (GLUCOPHAGE ) 1000 MG tablet [id number] Yes Take 1 tablet (1,000 mg total) by mouth 2 (two) times daily with a meal. Dennise Lavada POUR, MD  Active Self, Pharmacy Records           Med Note [facility], DONETA GORMAN Repress Sep 15, 2023 11:14 PM)    mirtazapine  (REMERON ) 7.5 MG tablet [id number] Yes Take 7.5 mg by mouth daily. [provider]  Active   nebivolol  (BYSTOLIC ) 5 MG tablet [id number] Yes Take 5 mg by mouth daily. [provider]  Active   pantoprazole  (PROTONIX ) 40 MG tablet [id number] Yes Take 1 tablet (40 mg total) by mouth daily. Shahmehdi,  Adriana LABOR, MD  Active   Potassium Chloride  ER 20 MEQ TBCR [id number] Yes Take 20 mEq by mouth in the morning. [provider]  Active Self, Pharmacy Records           Med Note [facility], DONETA GORMAN Repress Sep 15, 2023 11:15 PM)    pravastatin  (PRAVACHOL ) 40 MG tablet [id number] Yes Take 40 mg by mouth daily in the afternoon. [provider]  Active Self, Pharmacy Records            Home Care and Equipment/Supplies: Were Home Health Services Ordered?: No Any new equipment or medical supplies ordered?: No  Functional Questionnaire: Do you need assistance with bathing/showering or dressing?: No Do you need assistance with meal preparation?: No Do you need assistance with eating?: No Do you have  difficulty maintaining continence: Yes (weak kidneys at times no as bad) Do you need assistance with getting out of bed/getting out of a chair/moving?: No Do you have difficulty managing or taking your medications?: No  Follow up appointments reviewed: PCP Follow-up appointment confirmed?: Yes Date of PCP follow-up appointment?: 09/25/23 Follow-up Provider: Dr. Kennieth Leech Specialist [facility] Follow-up appointment confirmed?: Yes Follow-Up Specialty Provider:: 6 week week follow up Do you need transportation to your follow-up appointment?: No Do you understand care options if your condition(s) worsen?: Yes-patient verbalized understanding  SDOH Interventions Today    Flowsheet Row Most Recent Value  SDOH Interventions   Food Insecurity Interventions Intervention Not Indicated  Housing Interventions Intervention Not Indicated  Transportation Interventions Intervention Not Indicated  [Patient uses [facility] for medical transportation]  Utilities Interventions Intervention Not Indicated    Goals Addressed             This Visit's Progress    VBCI Transitions of Care (TOC) Care Plan       Problems:  Recent Hospitalization for treatment of Pulmonary Disease Nausea and Vomiting Readmitted 09/15/23 to 09/19/23 with COVID + Dehydration Knowledge Deficit Related to Medications and diet - patient to start Metformin  09/04/23 - was not sure when and reviewed AVS with patient -09/20/23 AVS reviewed with patient has medications as needed. 09/10/23 Patient unable to check BP and CBG - has ordered these from [facility] and should arrive this week.  09/20/23 Reviewed the importance of monitoring especially with symptoms that are present. 09/10/23 new medication for appetite and BP were started on 09/06/23 per patient - updated Medication list and importance of checking BP. Reminded patient of potential benefits from [facility] book or website for safety equipment for bathroom. 09/20/23 Restarted TOC program:  Discussed and  offered 30 day TOC program.  The patient has been provided with contact information for the care management team and has been advised to call with any health -related questions or concerns.  The patient verbalized understanding with current plan of care.  The patient is directed to their insurance card regarding availability of benefits coverage.    Goal:  Over the next 30 days, the patient will not experience hospital readmission  Interventions:  Transitions of Care: Health Screening reviewed with patient/caregiver, orders reviewed, and education provided Doctor Visits  - discussed the importance of doctor visits patient states has a follow up with Dr. Leech 09/25/23 at 2 pm  Patient Self Care Activities:  Attend all scheduled provider appointments Call pharmacy for medication refills 3-7 days in advance of running out of medications Call provider office for new concerns or questions  Notify RN Care Manager of [facility] call rescheduling needs  Participate in Transition of Care Program/Attend TOC scheduled calls Take medications as prescribed   09/20/23 Reviewed AVS with patient today 09/20/23 Patient encouraged to wear mask was positive for COVID 19 and patient states MD thought it may have been with her 1 st hospitalization but test wasn't done.  Plan:  Follow up with provider re: post hospital follow up and loss of appetite with appointment 09/04/23 with Dr. Benjamine Telephone follow up appointment with care management team member scheduled for:  09/10/23 at 11:00 am The care management team will reach out to the patient again over the next 5-10 business days. The patient has been provided with contact information for the care management team and has been advised to call with any health related questions or concerns.  Continue to eat slowly and follow up with PCP if symptoms worsen for nausea and vomiting to access if new medications is helping for appetite.  09/20/23 Follow up in a week to check  progress and changes.         Richerd Fish, RN, BSN, CCM [facility], [facility] RN Care Manager Direct Dial: [phone]

## 2023-09-20 NOTE — Patient Instructions (Signed)
 Visit Information  Thank you for taking time to visit with me today. Please don't hesitate to contact me if I can be of assistance to you before our next scheduled telephone appointment.  Our next appointment is by telephone on 09/27/23 at 1PM  Following is a copy of your care plan:   Goals Addressed             This Visit's Progress    VBCI Transitions of Care (TOC) Care Plan       Problems:  Recent Hospitalization for treatment of Pulmonary Disease Nausea and Vomiting Readmitted 09/15/23 to 09/19/23 with COVID + Dehydration Knowledge Deficit Related to Medications and diet - patient to start Metformin  09/04/23 - was not sure when and reviewed AVS with patient -09/20/23 AVS reviewed with patient has medications as needed. 09/10/23 Patient unable to check BP and CBG - has ordered these from [facility] and should arrive this week.  09/20/23 Reviewed the importance of monitoring especially with symptoms that are present. 09/10/23 new medication for appetite and BP were started on 09/06/23 per patient - updated Medication list and importance of checking BP. Reminded patient of potential benefits from [facility] book or website for safety equipment for bathroom. 09/20/23 Restarted TOC program:  Discussed and offered 30 day TOC program.  The patient has been provided with contact information for the care management team and has been advised to call with any health -related questions or concerns.  The patient verbalized understanding with current plan of care.  The patient is directed to their insurance card regarding availability of benefits coverage.    Goal:  Over the next 30 days, the patient will not experience hospital readmission  Interventions:  Transitions of Care: Health Screening reviewed with patient/caregiver, orders reviewed, and education provided Doctor Visits  - discussed the importance of doctor visits patient states has a follow up with Dr. Benjamine 09/25/23 at 2 pm  Patient Self Care Activities:   Attend all scheduled provider appointments Call pharmacy for medication refills 3-7 days in advance of running out of medications Call provider office for new concerns or questions  Notify RN Care Manager of [facility] call rescheduling needs Participate in Transition of Care Program/Attend TOC scheduled calls Take medications as prescribed   09/20/23 Reviewed AVS with patient today 09/20/23 Patient encouraged to wear mask was positive for COVID 19 and patient states MD thought it may have been with her 1 st hospitalization but test wasn't done.  Plan:  Follow up with provider re: post hospital follow up and loss of appetite with appointment 09/04/23 with Dr. Benjamine Telephone follow up appointment with care management team member scheduled for:  09/10/23 at 11:00 am The care management team will reach out to the patient again over the next 5-10 business days. The patient has been provided with contact information for the care management team and has been advised to call with any health related questions or concerns.  Continue to eat slowly and follow up with PCP if symptoms worsen for nausea and vomiting to access if new medications is helping for appetite.  09/20/23 Follow up in a week to check progress and changes.        Check for follow up states her gmail account is [email or web address] confirmed x 2  The patient has been provided with contact information for the care management team and has been advised to call with any health related questions or concerns.  The care management team will reach out to the patient again  over the next 5-10 business days.  Next PCP appointment scheduled for:   Please call the care guide team at [phone] if you need to cancel or reschedule your appointment.   Please call the [place]  National Suicide Prevention Lifeline: (437) 797-1304 or TTY: 774-083-8765 TTY (331)018-7756) to talk to a trained counselor if you are experiencing a Mental Health or Behavioral Health  Crisis or need someone to talk to.  Richerd Fish, RN, BSN, CCM [facility], [facility] RN Care Manager Direct Dial: [phone]

## 2023-09-23 DIAGNOSIS — G709 Myoneural disorder, unspecified: Secondary | ICD-10-CM | POA: Diagnosis not present

## 2023-09-23 DIAGNOSIS — Z7901 Long term (current) use of anticoagulants: Secondary | ICD-10-CM | POA: Diagnosis not present

## 2023-09-23 DIAGNOSIS — J45909 Unspecified asthma, uncomplicated: Secondary | ICD-10-CM | POA: Diagnosis not present

## 2023-09-23 DIAGNOSIS — D649 Anemia, unspecified: Secondary | ICD-10-CM | POA: Diagnosis not present

## 2023-09-23 DIAGNOSIS — E876 Hypokalemia: Secondary | ICD-10-CM | POA: Diagnosis not present

## 2023-09-23 DIAGNOSIS — E1151 Type 2 diabetes mellitus with diabetic peripheral angiopathy without gangrene: Secondary | ICD-10-CM | POA: Diagnosis not present

## 2023-09-23 DIAGNOSIS — N179 Acute kidney failure, unspecified: Secondary | ICD-10-CM | POA: Diagnosis not present

## 2023-09-23 DIAGNOSIS — E785 Hyperlipidemia, unspecified: Secondary | ICD-10-CM | POA: Diagnosis not present

## 2023-09-23 DIAGNOSIS — I5030 Unspecified diastolic (congestive) heart failure: Secondary | ICD-10-CM | POA: Diagnosis not present

## 2023-09-23 DIAGNOSIS — E8729 Other acidosis: Secondary | ICD-10-CM | POA: Diagnosis not present

## 2023-09-23 DIAGNOSIS — E46 Unspecified protein-calorie malnutrition: Secondary | ICD-10-CM | POA: Diagnosis not present

## 2023-09-23 DIAGNOSIS — E11649 Type 2 diabetes mellitus with hypoglycemia without coma: Secondary | ICD-10-CM | POA: Diagnosis not present

## 2023-09-23 DIAGNOSIS — F17210 Nicotine dependence, cigarettes, uncomplicated: Secondary | ICD-10-CM | POA: Diagnosis not present

## 2023-09-23 DIAGNOSIS — I110 Hypertensive heart disease with heart failure: Secondary | ICD-10-CM | POA: Diagnosis not present

## 2023-09-23 DIAGNOSIS — Z7984 Long term (current) use of oral hypoglycemic drugs: Secondary | ICD-10-CM | POA: Diagnosis not present

## 2023-09-23 DIAGNOSIS — R1310 Dysphagia, unspecified: Secondary | ICD-10-CM | POA: Diagnosis not present

## 2023-09-23 DIAGNOSIS — I2699 Other pulmonary embolism without acute cor pulmonale: Secondary | ICD-10-CM | POA: Diagnosis not present

## 2023-09-23 DIAGNOSIS — Z86718 Personal history of other venous thrombosis and embolism: Secondary | ICD-10-CM | POA: Diagnosis not present

## 2023-09-26 DIAGNOSIS — E785 Hyperlipidemia, unspecified: Secondary | ICD-10-CM | POA: Diagnosis not present

## 2023-09-26 DIAGNOSIS — E1151 Type 2 diabetes mellitus with diabetic peripheral angiopathy without gangrene: Secondary | ICD-10-CM | POA: Diagnosis not present

## 2023-09-26 DIAGNOSIS — I2699 Other pulmonary embolism without acute cor pulmonale: Secondary | ICD-10-CM | POA: Diagnosis not present

## 2023-09-26 DIAGNOSIS — E8729 Other acidosis: Secondary | ICD-10-CM | POA: Diagnosis not present

## 2023-09-26 DIAGNOSIS — Z7984 Long term (current) use of oral hypoglycemic drugs: Secondary | ICD-10-CM | POA: Diagnosis not present

## 2023-09-26 DIAGNOSIS — I5030 Unspecified diastolic (congestive) heart failure: Secondary | ICD-10-CM | POA: Diagnosis not present

## 2023-09-26 DIAGNOSIS — E876 Hypokalemia: Secondary | ICD-10-CM | POA: Diagnosis not present

## 2023-09-26 DIAGNOSIS — E46 Unspecified protein-calorie malnutrition: Secondary | ICD-10-CM | POA: Diagnosis not present

## 2023-09-26 DIAGNOSIS — E11649 Type 2 diabetes mellitus with hypoglycemia without coma: Secondary | ICD-10-CM | POA: Diagnosis not present

## 2023-09-26 DIAGNOSIS — D649 Anemia, unspecified: Secondary | ICD-10-CM | POA: Diagnosis not present

## 2023-09-26 DIAGNOSIS — Z7901 Long term (current) use of anticoagulants: Secondary | ICD-10-CM | POA: Diagnosis not present

## 2023-09-26 DIAGNOSIS — Z86718 Personal history of other venous thrombosis and embolism: Secondary | ICD-10-CM | POA: Diagnosis not present

## 2023-09-26 DIAGNOSIS — N179 Acute kidney failure, unspecified: Secondary | ICD-10-CM | POA: Diagnosis not present

## 2023-09-26 DIAGNOSIS — F17210 Nicotine dependence, cigarettes, uncomplicated: Secondary | ICD-10-CM | POA: Diagnosis not present

## 2023-09-26 DIAGNOSIS — I110 Hypertensive heart disease with heart failure: Secondary | ICD-10-CM | POA: Diagnosis not present

## 2023-09-26 DIAGNOSIS — R1310 Dysphagia, unspecified: Secondary | ICD-10-CM | POA: Diagnosis not present

## 2023-09-26 DIAGNOSIS — G709 Myoneural disorder, unspecified: Secondary | ICD-10-CM | POA: Diagnosis not present

## 2023-09-26 DIAGNOSIS — J45909 Unspecified asthma, uncomplicated: Secondary | ICD-10-CM | POA: Diagnosis not present

## 2023-09-27 ENCOUNTER — Other Ambulatory Visit: Payer: Self-pay

## 2023-09-27 ENCOUNTER — Telehealth: Payer: Self-pay

## 2023-09-27 NOTE — Patient Instructions (Addendum)
 Visit Information  Thank you for taking time to visit with me today. Please don't hesitate to contact me if I can be of assistance to you before our next scheduled telephone appointment.  Our next appointment is by telephone on 10/04/23 at 2:00 PM  Following is a copy of your care plan:   Goals Addressed             This Visit's Progress    VBCI Transitions of Care (TOC) Care Plan       Problems:  Recent Hospitalization for treatment of Pulmonary Disease Nausea and Vomiting Readmitted 09/15/23 to 09/19/23 with COVID + Dehydration Knowledge Deficit Related to Medications and diet - patient to start Metformin  09/04/23 - was not sure when and reviewed AVS with patient -09/20/23 AVS reviewed with patient has medications as needed. 09/10/23 Patient unable to check BP and CBG - has ordered these from [facility] and should arrive this week.  09/20/23 Reviewed the importance of monitoring especially with symptoms that are present. 09/10/23 new medication for appetite and BP were started on 09/06/23 per patient - updated Medication list and importance of checking BP. Reminded patient of potential benefits from [facility] book or website for safety equipment for bathroom. 09/20/23 Restarted TOC program:  Discussed and offered 30 day TOC program.  The patient has been provided with contact information for the care management team and has been advised to call with any health -related questions or concerns.  The patient verbalized understanding with current plan of care.  The patient is directed to their insurance card regarding availability of benefits coverage.   09/27/23 States her Magnesium  was a little low, still have a low appetite, Remeron  she thinks is helping a little  Goal:  Over the next 30 days, the patient will not experience hospital readmission  Interventions:  Transitions of Care: Health Screening reviewed with patient/caregiver, orders reviewed, and education provided Doctor Visits  - discussed the importance  of doctor visits patient states has a follow up with Dr. Benjamine 09/25/23 at 2 pm  Patient Self Care Activities:  Attend all scheduled provider appointments Call pharmacy for medication refills 3-7 days in advance of running out of medications Call provider office for new concerns or questions  Notify RN Care Manager of [facility] call rescheduling needs Participate in Transition of Care Program/Attend TOC scheduled calls Take medications as prescribed   09/20/23 Reviewed AVS with patient today 09/20/23 Patient encouraged to wear mask was positive for COVID 19 and patient states MD thought it may have been with her 1st hospitalization but test wasn't done. 09/27/23 Try foods rich in Magnesium  - states she only like certain greens and never was able to tolerate spinach. Plan:  Follow up with provider re: post hospital follow up and loss of appetite with appointment 09/04/23 with Dr. Benjamine Telephone follow up appointment with care management team member scheduled for:  09/10/23 at 11:00 am The care management team will reach out to the patient again over the next 5-10 business days. The patient has been provided with contact information for the care management team and has been advised to call with any health related questions or concerns.  Continue to eat slowly and follow up with PCP if symptoms worsen for nausea and vomiting to access if new medications is helping for appetite.  09/20/23 Restarted program - Follow up in a week to check progress and changes. 09/27/23 Discussed Magnesium  rich foods, continue to monitor weight with low appetite, call PCP for any s/s  that are worsening with appetite, changes in bowels, s/s of infections fever, chills.   Follow up next week telephonically        The patient verbalized understanding of instructions, educational materials, and care plan provided today and DECLINED offer to receive copy of patient instructions, educational materials, and care plan.  States, I  haven't even checked the email from last time but I know what we have talked about.  The patient has been provided with contact information for the care management team and has been advised to call with any health related questions or concerns.  The care management team will reach out to the patient again over the next 5- 10 business days.  Follow up with provider re: blood pressure issues  Please call the care guide team at [phone] if you need to cancel or reschedule your appointment.   Please call the [place]  National Suicide Prevention Lifeline: 302-417-8905 or TTY: 7193264164 TTY (929)166-7859) to talk to a trained counselor if you are experiencing a Mental Health or Behavioral Health Crisis or need someone to talk to.  Richerd Fish, RN, BSN, CCM [facility], [facility] RN Care Manager Direct Dial: [phone]

## 2023-09-27 NOTE — Transitions of Care (Post Inpatient/ED Visit) (Signed)
 Transition of Care week 2  Visit Note  09/27/2023  Name: Angie Carr MRN: [id number]          DOB: Feb 20, 1955  Situation: Patient enrolled in [facility] 30-day program. Visit completed with patient by telephone.   Background:   Initial Transition Care Management Follow-up Telephone Call    Past Medical History:  Diagnosis Date   Anemia    Anxiety    Asthma    pt denies   Diabetes mellitus without complication (HCC)    DJD (degenerative joint disease)    lower back   GERD (gastroesophageal reflux disease)    Headache    miagraines, none in years   Hypertension    LBP (low back pain)    Morbid obesity (HCC)    Neuromuscular disorder (HCC)    feet tingle and her balance is sometimes a problem   Primary osteoarthritis of both knees    Smoker     Assessment: Patient Reported Symptoms: Cognitive Cognitive Status: Alert and oriented to person, place, and time, Normal speech and language skills      Neurological Neurological Review of Symptoms: No symptoms reported Neurological Management Strategies: Activity, Adequate rest, Medication therapy Neurological Self-Management Outcome: 3 (uncertain) Neurological Comment: better  HEENT HEENT Symptoms Reported: No symptoms reported      Cardiovascular Cardiovascular Symptoms Reported: No symptoms reported Does patient have uncontrolled Hypertension?: Yes Patient's Recent BP reading at home: 81/72 at home 104/71 today (at doctor's appointment) Cardiovascular Management Strategies: Activity, Adequate rest, Medication therapy Weight: 196 lb (88.9 kg) (at MD office yesterday) Cardiovascular Self-Management Outcome: 3 (uncertain)  Respiratory Respiratory Symptoms Reported: No symptoms reported Other Respiratory Symptoms: better Respiratory Management Strategies: Activity, Adequate rest, Medication therapy Respiratory Self-Management Outcome: 4 (good)  Endocrine Endocrine Symptoms Reported: No symptoms reported Is patient  diabetic?: Yes Is patient checking blood sugars at home?:  (82 at MD appointment 09/25/23) List most recent blood sugar readings, include date and time of day: still awaiting Endocrine Self-Management Outcome: 4 (good)  Gastrointestinal Gastrointestinal Symptoms Reported: Change in appetite Additional Gastrointestinal Details: better Gastrointestinal Management Strategies: Adequate rest Gastrointestinal Self-Management Outcome: 3 (uncertain)    Genitourinary Genitourinary Symptoms Reported: Incontinence Genitourinary Management Strategies: Incontinence garment/pad Genitourinary Self-Management Outcome: 4 (good)  Integumentary Integumentary Symptoms Reported: No symptoms reported Additional Integumentary Details: healed Skin Management Strategies: Medication therapy Skin Self-Management Outcome: 4 (good)  Musculoskeletal Musculoskelatal Symptoms Reviewed: No symptoms reported Additional Musculoskeletal Details: better Musculoskeletal Management Strategies: Activity, Adequate rest, Medical device, Medication therapy Musculoskeletal Self-Management Outcome: 4 (good)      Psychosocial Psychosocial Symptoms Reported: No symptoms reported         Vitals:   09/27/23 1305  BP: 104/71    Medications Reviewed Today     Reviewed by Eilleen Richerd GRADE, RN (Registered Nurse) on 09/27/23 at 1304  Med List Status: <None>   Medication Order Taking? Sig Documenting Provider Last Dose Status Informant  apixaban  (ELIQUIS ) 5 MG TABS tablet [id number] Yes Take 2 tablets (10 mg total) by mouth 2 (two) times daily for 7 days, THEN 1 tablet (5 mg total) 2 (two) times daily. Willette Adriana LABOR, MD  Active   cilostazol  (PLETAL ) 50 MG tablet [id number] Yes Take 50 mg by mouth 2 (two) times daily. [provider]  Active Self, Pharmacy Records           Med Note [facility], DONETA GORMAN Repress Sep 15, 2023 11:13 PM)    diphenoxylate-atropine (LOMOTIL) 2.5-0.025 MG tablet [id number]  Take 1 tablet by mouth 4  (four) times daily as needed for diarrhea or loose stools.  Patient not taking: Reported on 09/20/2023   [provider]  Active Self, Pharmacy Records           Med Note [facility], DONETA GORMAN Repress Sep 15, 2023 11:13 PM)    fesoterodine  (TOVIAZ ) 4 MG TB24 tablet [id number] Yes Take 4 mg by mouth daily. [provider]  Active Self, Pharmacy Records           Med Note [facility], DONETA GORMAN Repress Sep 15, 2023 11:13 PM)    gabapentin  (NEURONTIN ) 400 MG capsule [id number] Yes Take 400 mg by mouth See admin instructions. Take 1 capsule (400 mg) by mouth in the morning & take 2 capsules (800 mg) by mouth in the evening. [provider]  Active Self, Pharmacy Records           Med Note [facility], DONETA GORMAN Repress Sep 15, 2023 11:14 PM)    metFORMIN  (GLUCOPHAGE ) 1000 MG tablet [id number] Yes Take 1 tablet (1,000 mg total) by mouth 2 (two) times daily with a meal. Dennise Lavada POUR, MD  Active Self, Pharmacy Records           Med Note [facility], DONETA GORMAN Repress Sep 15, 2023 11:14 PM)    mirtazapine  (REMERON ) 7.5 MG tablet [id number] Yes Take 7.5 mg by mouth daily. [provider]  Active   nebivolol  (BYSTOLIC ) 5 MG tablet [id number] Yes Take 5 mg by mouth daily. [provider]  Active   pantoprazole  (PROTONIX ) 40 MG tablet [id number] Yes Take 1 tablet (40 mg total) by mouth daily. Willette Adriana LABOR, MD  Active   Potassium Chloride  ER 20 MEQ TBCR [id number] Yes Take 20 mEq by mouth in the morning. [provider]  Active Self, Pharmacy Records           Med Note [facility], DONETA GORMAN Repress Sep 15, 2023 11:15 PM)    pravastatin  (PRAVACHOL ) 40 MG tablet [id number] Yes Take 40 mg by mouth daily in the afternoon. [provider]  Active Self, Pharmacy Records            Recommendation:   Encouraged magnesium  rich foods reviewed with patient's likes and dislikes and intolerances. Continue Current Plan of Care  Follow Up Plan:   Telephone follow-up in 1 week for October 04, 2023 at 2 PM  Richerd Fish, Charity fundraiser, Scientist, research (physical sciences), CCM [employer], IT trainer Dial: [phone]

## 2023-09-30 DIAGNOSIS — I2699 Other pulmonary embolism without acute cor pulmonale: Secondary | ICD-10-CM | POA: Diagnosis not present

## 2023-09-30 DIAGNOSIS — Z7984 Long term (current) use of oral hypoglycemic drugs: Secondary | ICD-10-CM | POA: Diagnosis not present

## 2023-09-30 DIAGNOSIS — I110 Hypertensive heart disease with heart failure: Secondary | ICD-10-CM | POA: Diagnosis not present

## 2023-09-30 DIAGNOSIS — E46 Unspecified protein-calorie malnutrition: Secondary | ICD-10-CM | POA: Diagnosis not present

## 2023-09-30 DIAGNOSIS — E1151 Type 2 diabetes mellitus with diabetic peripheral angiopathy without gangrene: Secondary | ICD-10-CM | POA: Diagnosis not present

## 2023-09-30 DIAGNOSIS — J45909 Unspecified asthma, uncomplicated: Secondary | ICD-10-CM | POA: Diagnosis not present

## 2023-09-30 DIAGNOSIS — E8729 Other acidosis: Secondary | ICD-10-CM | POA: Diagnosis not present

## 2023-09-30 DIAGNOSIS — F17210 Nicotine dependence, cigarettes, uncomplicated: Secondary | ICD-10-CM | POA: Diagnosis not present

## 2023-09-30 DIAGNOSIS — Z7901 Long term (current) use of anticoagulants: Secondary | ICD-10-CM | POA: Diagnosis not present

## 2023-09-30 DIAGNOSIS — D649 Anemia, unspecified: Secondary | ICD-10-CM | POA: Diagnosis not present

## 2023-09-30 DIAGNOSIS — E876 Hypokalemia: Secondary | ICD-10-CM | POA: Diagnosis not present

## 2023-09-30 DIAGNOSIS — I5030 Unspecified diastolic (congestive) heart failure: Secondary | ICD-10-CM | POA: Diagnosis not present

## 2023-09-30 DIAGNOSIS — R1310 Dysphagia, unspecified: Secondary | ICD-10-CM | POA: Diagnosis not present

## 2023-09-30 DIAGNOSIS — N179 Acute kidney failure, unspecified: Secondary | ICD-10-CM | POA: Diagnosis not present

## 2023-09-30 DIAGNOSIS — E11649 Type 2 diabetes mellitus with hypoglycemia without coma: Secondary | ICD-10-CM | POA: Diagnosis not present

## 2023-09-30 DIAGNOSIS — Z86718 Personal history of other venous thrombosis and embolism: Secondary | ICD-10-CM | POA: Diagnosis not present

## 2023-09-30 DIAGNOSIS — G709 Myoneural disorder, unspecified: Secondary | ICD-10-CM | POA: Diagnosis not present

## 2023-09-30 DIAGNOSIS — E785 Hyperlipidemia, unspecified: Secondary | ICD-10-CM | POA: Diagnosis not present

## 2023-10-02 DIAGNOSIS — I110 Hypertensive heart disease with heart failure: Secondary | ICD-10-CM | POA: Diagnosis not present

## 2023-10-02 DIAGNOSIS — I2699 Other pulmonary embolism without acute cor pulmonale: Secondary | ICD-10-CM | POA: Diagnosis not present

## 2023-10-02 DIAGNOSIS — Z86718 Personal history of other venous thrombosis and embolism: Secondary | ICD-10-CM | POA: Diagnosis not present

## 2023-10-02 DIAGNOSIS — E876 Hypokalemia: Secondary | ICD-10-CM | POA: Diagnosis not present

## 2023-10-02 DIAGNOSIS — G709 Myoneural disorder, unspecified: Secondary | ICD-10-CM | POA: Diagnosis not present

## 2023-10-02 DIAGNOSIS — E785 Hyperlipidemia, unspecified: Secondary | ICD-10-CM | POA: Diagnosis not present

## 2023-10-02 DIAGNOSIS — E8729 Other acidosis: Secondary | ICD-10-CM | POA: Diagnosis not present

## 2023-10-02 DIAGNOSIS — J45909 Unspecified asthma, uncomplicated: Secondary | ICD-10-CM | POA: Diagnosis not present

## 2023-10-02 DIAGNOSIS — Z7901 Long term (current) use of anticoagulants: Secondary | ICD-10-CM | POA: Diagnosis not present

## 2023-10-02 DIAGNOSIS — E11649 Type 2 diabetes mellitus with hypoglycemia without coma: Secondary | ICD-10-CM | POA: Diagnosis not present

## 2023-10-02 DIAGNOSIS — E46 Unspecified protein-calorie malnutrition: Secondary | ICD-10-CM | POA: Diagnosis not present

## 2023-10-02 DIAGNOSIS — D649 Anemia, unspecified: Secondary | ICD-10-CM | POA: Diagnosis not present

## 2023-10-02 DIAGNOSIS — E1151 Type 2 diabetes mellitus with diabetic peripheral angiopathy without gangrene: Secondary | ICD-10-CM | POA: Diagnosis not present

## 2023-10-02 DIAGNOSIS — R1310 Dysphagia, unspecified: Secondary | ICD-10-CM | POA: Diagnosis not present

## 2023-10-02 DIAGNOSIS — F17210 Nicotine dependence, cigarettes, uncomplicated: Secondary | ICD-10-CM | POA: Diagnosis not present

## 2023-10-02 DIAGNOSIS — N179 Acute kidney failure, unspecified: Secondary | ICD-10-CM | POA: Diagnosis not present

## 2023-10-02 DIAGNOSIS — Z7984 Long term (current) use of oral hypoglycemic drugs: Secondary | ICD-10-CM | POA: Diagnosis not present

## 2023-10-02 DIAGNOSIS — I5030 Unspecified diastolic (congestive) heart failure: Secondary | ICD-10-CM | POA: Diagnosis not present

## 2023-10-04 ENCOUNTER — Telehealth: Payer: Self-pay

## 2023-10-08 ENCOUNTER — Telehealth: Payer: Self-pay

## 2023-10-08 ENCOUNTER — Other Ambulatory Visit: Payer: Self-pay

## 2023-10-09 NOTE — Patient Instructions (Signed)
 Visit Information  Thank you for taking time to visit with me today. Please don't hesitate to contact me if I can be of assistance to you before our next scheduled telephone appointment.  Our next appointment is by telephone on 10/17/23 at 11:00 AM  Following is a copy of your care plan:   Goals Addressed             This Visit's Progress    VBCI Transitions of Care (TOC) Care Plan   Improving    Problems:  Recent Hospitalization for treatment of Pulmonary Disease Nausea and Vomiting Readmitted 09/15/23 to 09/19/23 with COVID + Dehydration Knowledge Deficit Related to Medications and diet - patient to start Metformin  09/04/23 - was not sure when and reviewed AVS with patient -09/20/23 AVS reviewed with patient has medications as needed. 09/10/23 Patient unable to check BP and CBG - has ordered these from [facility] and should arrive this week.  09/20/23 Reviewed the importance of monitoring especially with symptoms that are present. 09/10/23 new medication for appetite and BP were started on 09/06/23 per patient - updated Medication list and importance of checking BP. Reminded patient of potential benefits from [facility] book or website for safety equipment for bathroom. 09/20/23 Restarted TOC program:  Discussed and offered 30 day TOC program.  The patient has been provided with contact information for the care management team and has been advised to call with any health -related questions or concerns.  The patient verbalized understanding with current plan of care.  The patient is directed to their insurance card regarding availability of benefits coverage.   09/27/23 States her Magnesium  was a little low, still have a low appetite, Remeron  she thinks is helping a little 10/08/23 Patient states she had a prescription not listed for Naproxen EC she forgot to mention on her medication list prescribed by Dr. Benjamine because she feels more tingling in her feet the past few days made her think about it. Review Gabapentin   and she confirms she is taking it as prescribed.  Goal:  Over the next 30 days, the patient will not experience hospital readmission  Interventions:  Transitions of Care: Health Screening reviewed with patient/caregiver, orders reviewed, and education provided Doctor Visits  - discussed the importance of doctor visits patient states has a follow up with Dr. Benjamine 09/25/23 at 2 pm  Patient Self Care Activities:  Attend all scheduled provider appointments Call pharmacy for medication refills 3-7 days in advance of running out of medications Call provider office for new concerns or questions  Notify RN Care Manager of [facility] call rescheduling needs Participate in Transition of Care Program/Attend TOC scheduled calls Take medications as prescribed   09/20/23 Reviewed AVS with patient today 09/20/23 Patient encouraged to wear mask was positive for COVID 19 and patient states MD thought it may have been with her 1st hospitalization but test wasn't done. 09/27/23 Try foods rich in Magnesium  - states she only like certain greens and never was able to tolerate spinach. Plan:  Follow up with provider re: post hospital follow up and loss of appetite with appointment 09/04/23 with Dr. Benjamine Telephone follow up appointment with care management team member scheduled for:  09/10/23 at 11:00 am The care management team will reach out to the patient again over the next 5-10 business days. The patient has been provided with contact information for the care management team and has been advised to call with any health related questions or concerns.  Continue to eat slowly and  follow up with PCP if symptoms worsen for nausea and vomiting to access if new medications is helping for appetite.  09/20/23 Restarted program - Follow up in a week to check progress and changes. 10/04/23 patient was unable to keep appointment as she forgot she had a hair appointment. 09/27/23 Discussed Magnesium  rich foods, continue to monitor  weight with low appetite, call PCP for any s/s that are worsening with appetite, changes in bowels, s/s of infections fever, chills.   Follow up next week telephonically        Patient verbalizes understanding of instructions and care plan provided today and agrees to view in MyChart. Active MyChart status and patient understanding of how to access instructions and care plan via MyChart confirmed with patient.     The patient has been provided with contact information for the care management team and has been advised to call with any health related questions or concerns.  The care management team will reach out to the patient again over the next 7-10 business days.   Please call the care guide team at [phone] if you need to cancel or reschedule your appointment.   Please call the [place]  National Suicide Prevention Lifeline: (832)832-5995 or TTY: 607-169-4818 TTY 360-124-1279) to talk to a trained counselor if you are experiencing a Mental Health or Behavioral Health Crisis or need someone to talk to.  Richerd Fish, RN, BSN, CCM [facility], [facility] RN Care Manager Direct Dial: [phone]

## 2023-10-09 NOTE — Transitions of Care (Post Inpatient/ED Visit) (Signed)
 Transition of Care week 3  Visit Note  10/09/2023 for 10/08/23  Name: Angie Carr MRN: [id number]          DOB: 10/16/1955  Situation: Patient enrolled in [facility] 30-day program. Visit completed with patient by telephone.   Background:   Initial Transition Care Management Follow-up Telephone Call    Past Medical History:  Diagnosis Date   Anemia    Anxiety    Asthma    pt denies   Diabetes mellitus without complication (HCC)    DJD (degenerative joint disease)    lower back   GERD (gastroesophageal reflux disease)    Headache    miagraines, none in years   Hypertension    LBP (low back pain)    Morbid obesity (HCC)    Neuromuscular disorder (HCC)    feet tingle and her balance is sometimes a problem   Primary osteoarthritis of both knees    Smoker     Assessment: Patient Reported Symptoms: Cognitive Cognitive Status: Able to follow simple commands, Alert and oriented to person, place, and time, No symptoms reported, Normal speech and language skills      Neurological Neurological Review of Symptoms: Numbness Neurological Management Strategies: Activity, Adequate rest, Medication therapy, Routine screening  HEENT HEENT Symptoms Reported: No symptoms reported      Cardiovascular Cardiovascular Symptoms Reported: No symptoms reported Does patient have uncontrolled Hypertension?: Yes    Respiratory Respiratory Symptoms Reported: Productive cough Other Respiratory Symptoms: a little phlegm Respiratory Management Strategies: Activity, Adequate rest, Medication therapy  Endocrine Endocrine Symptoms Reported: No symptoms reported Is patient diabetic?: Yes Is patient checking blood sugars at home?: No (Not yet) Endocrine Self-Management Outcome: 3 (uncertain)  Gastrointestinal Gastrointestinal Symptoms Reported: No symptoms reported Additional Gastrointestinal Details: better - eating small meals and tolerating      Genitourinary Genitourinary Symptoms Reported:  Incontinence    Integumentary Integumentary Symptoms Reported: No symptoms reported Skin Management Strategies: Medication therapy, Routine screening (Has foot assessment at PCP MD visits) Skin Self-Management Outcome: 4 (good)  Musculoskeletal Musculoskelatal Symptoms Reviewed: No symptoms reported        Psychosocial Psychosocial Symptoms Reported: No symptoms reported          Medications Reviewed Today     Reviewed by Eilleen Richerd GRADE, RN (Registered Nurse) on 10/08/23 at 1134  Med List Status: <None>   Medication Order Taking? Sig Documenting Provider Last Dose Status Informant  apixaban  (ELIQUIS ) 5 MG TABS tablet [id number]  Take 2 tablets (10 mg total) by mouth 2 (two) times daily for 7 days, THEN 1 tablet (5 mg total) 2 (two) times daily. Willette Adriana LABOR, MD  Active   cilostazol  (PLETAL ) 50 MG tablet [id number]  Take 50 mg by mouth 2 (two) times daily. [provider]  Active Self, Pharmacy Records           Med Note (WHITE, DONETA GORMAN Repress Sep 15, 2023 11:13 PM)    diphenoxylate-atropine (LOMOTIL) 2.5-0.025 MG tablet [id number]  Take 1 tablet by mouth 4 (four) times daily as needed for diarrhea or loose stools.  Patient not taking: Reported on 09/20/2023   [provider]  Active Self, Pharmacy Records           Med Note [facility], DONETA GORMAN Repress Sep 15, 2023 11:13 PM)    fesoterodine  (TOVIAZ ) 4 MG TB24 tablet [id number]  Take 4 mg by mouth daily. [provider]  Active Self, Pharmacy Records  Med Note (WHITE, DONETA RAMAN   Sun Sep 15, 2023 11:13 PM)    gabapentin  (NEURONTIN ) 400 MG capsule [id number]  Take 400 mg by mouth See admin instructions. Take 1 capsule (400 mg) by mouth in the morning & take 2 capsules (800 mg) by mouth in the evening. [provider]  Active Self, Pharmacy Records           Med Note [facility] DONETA RAMAN Repress Sep 15, 2023 11:14 PM)    metFORMIN  (GLUCOPHAGE ) 1000 MG tablet [id number]  Take 1 tablet (1,000 mg total) by  mouth 2 (two) times daily with a meal. Dennise Lavada POUR, MD  Active Self, Pharmacy Records           Med Note [facility], DONETA RAMAN Repress Sep 15, 2023 11:14 PM)    mirtazapine  (REMERON ) 7.5 MG tablet [id number]  Take 7.5 mg by mouth daily. [provider]  Active   naproxen (EC NAPROSYN) 500 MG EC tablet [id number]  Take 500 mg by mouth 2 (two) times daily with a meal.  Patient not taking: Reported on 10/08/2023   [provider]  Active            Med Note LESLY, RICHERD CINDERELLA Debar Oct 08, 2023 11:32 AM) Patient states she was taking this and was prescribed by Dr. Benjamine on 04/29/23 and needs a refill due to tingling in both feet for neuropathy  nebivolol  (BYSTOLIC ) 5 MG tablet [id number]  Take 5 mg by mouth daily. [provider]  Active   pantoprazole  (PROTONIX ) 40 MG tablet [id number]  Take 1 tablet (40 mg total) by mouth daily. Willette Adriana LABOR, MD  Active   Potassium Chloride  ER 20 MEQ TBCR [id number]  Take 20 mEq by mouth in the morning. [provider]  Active Self, Pharmacy Records           Med Note [facility], DONETA RAMAN Repress Sep 15, 2023 11:15 PM)    pravastatin  (PRAVACHOL ) 40 MG tablet [id number]  Take 40 mg by mouth daily in the afternoon. [provider]  Active Self, Pharmacy Records            Recommendation:   Continue Current Plan of Care  Follow Up Plan:   Telephone follow-up in 1 week  RICHERD Fish, RN, Scientist, research (physical sciences), CCM [employer], [facility] RN Care Manager Direct Dial: [phone]

## 2023-10-14 DIAGNOSIS — E1151 Type 2 diabetes mellitus with diabetic peripheral angiopathy without gangrene: Secondary | ICD-10-CM | POA: Diagnosis not present

## 2023-10-14 DIAGNOSIS — F17210 Nicotine dependence, cigarettes, uncomplicated: Secondary | ICD-10-CM | POA: Diagnosis not present

## 2023-10-14 DIAGNOSIS — Z7984 Long term (current) use of oral hypoglycemic drugs: Secondary | ICD-10-CM | POA: Diagnosis not present

## 2023-10-14 DIAGNOSIS — E11649 Type 2 diabetes mellitus with hypoglycemia without coma: Secondary | ICD-10-CM | POA: Diagnosis not present

## 2023-10-14 DIAGNOSIS — E8729 Other acidosis: Secondary | ICD-10-CM | POA: Diagnosis not present

## 2023-10-14 DIAGNOSIS — E46 Unspecified protein-calorie malnutrition: Secondary | ICD-10-CM | POA: Diagnosis not present

## 2023-10-14 DIAGNOSIS — I110 Hypertensive heart disease with heart failure: Secondary | ICD-10-CM | POA: Diagnosis not present

## 2023-10-14 DIAGNOSIS — D649 Anemia, unspecified: Secondary | ICD-10-CM | POA: Diagnosis not present

## 2023-10-14 DIAGNOSIS — G709 Myoneural disorder, unspecified: Secondary | ICD-10-CM | POA: Diagnosis not present

## 2023-10-14 DIAGNOSIS — Z86718 Personal history of other venous thrombosis and embolism: Secondary | ICD-10-CM | POA: Diagnosis not present

## 2023-10-14 DIAGNOSIS — N179 Acute kidney failure, unspecified: Secondary | ICD-10-CM | POA: Diagnosis not present

## 2023-10-14 DIAGNOSIS — Z7901 Long term (current) use of anticoagulants: Secondary | ICD-10-CM | POA: Diagnosis not present

## 2023-10-14 DIAGNOSIS — E876 Hypokalemia: Secondary | ICD-10-CM | POA: Diagnosis not present

## 2023-10-14 DIAGNOSIS — J45909 Unspecified asthma, uncomplicated: Secondary | ICD-10-CM | POA: Diagnosis not present

## 2023-10-14 DIAGNOSIS — I2699 Other pulmonary embolism without acute cor pulmonale: Secondary | ICD-10-CM | POA: Diagnosis not present

## 2023-10-14 DIAGNOSIS — R1310 Dysphagia, unspecified: Secondary | ICD-10-CM | POA: Diagnosis not present

## 2023-10-14 DIAGNOSIS — I5030 Unspecified diastolic (congestive) heart failure: Secondary | ICD-10-CM | POA: Diagnosis not present

## 2023-10-14 DIAGNOSIS — E785 Hyperlipidemia, unspecified: Secondary | ICD-10-CM | POA: Diagnosis not present

## 2023-10-17 ENCOUNTER — Other Ambulatory Visit: Payer: Self-pay

## 2023-10-17 ENCOUNTER — Telehealth: Payer: Self-pay

## 2023-10-17 DIAGNOSIS — E1169 Type 2 diabetes mellitus with other specified complication: Secondary | ICD-10-CM

## 2023-10-17 DIAGNOSIS — I152 Hypertension secondary to endocrine disorders: Secondary | ICD-10-CM

## 2023-10-17 NOTE — Patient Instructions (Addendum)
 Visit Information  Thank you for taking time to visit with me today. Please don't hesitate to contact me if I can be of assistance to you before our next scheduled telephone appointment.  Your next appointment is to be with the Longitudinal RN for ongoing Complex Care Management needs.  Following is a copy of your care plan:   Goals Addressed             This Visit's Progress    COMPLETED: [facility] Transitions of Care (TOC) Care Plan   On track    Problems: (Care Plan restarted 09/20/23; reviewed weekly; completed 10/17/23) Recent Hospitalization for treatment of Pulmonary Disease Nausea and Vomiting Readmitted 09/15/23 to 09/19/23 with COVID + Dehydration Knowledge Deficit Related to Medications and diet - patient to start Metformin  09/04/23 - was not sure when and reviewed AVS with patient -09/20/23 AVS reviewed with patient has medications as needed. 09/10/23 Patient unable to check BP and CBG - has ordered these from [facility] and should arrive this week.  09/20/23 Reviewed the importance of monitoring especially with symptoms that are present. 09/10/23 new medication for appetite and BP were started on 09/06/23 per patient - updated Medication list and importance of checking BP. Reminded patient of potential benefits from [facility] book or website for safety equipment for bathroom. 09/20/23 Restarted TOC program:  Discussed and offered 30 day TOC program.  The patient has been provided with contact information for the care management team and has been advised to call with any health -related questions or concerns.  The patient verbalized understanding with current plan of care.  The patient is directed to their insurance card regarding availability of benefits coverage.   09/27/23 States her Magnesium  was a little low, still have a low appetite, Remeron  she thinks is helping a little 10/08/23 Patient states she had a prescription not listed for Naproxen EC she forgot to mention on her medication list prescribed by Dr.  Benjamine because she feels more tingling in her feet the past few days made her think about it. Review Gabapentin  and she confirms she is taking it as prescribed. 10/17/23 Still eating small portions and going appetite better, has been able to take pills better without vomiting.  Goal:  Over the next 30 days, the patient will not experience hospital readmission  Interventions:  Transitions of Care: Health Screening reviewed with patient/caregiver, orders reviewed, and education provided Doctor Visits  - discussed the importance of doctor visits patient states has a follow up with Dr. Benjamine 10/25/23 at 2 pm  Patient Self Care Activities:  Attend all scheduled provider appointments Call pharmacy for medication refills 3-7 days in advance of running out of medications Call provider office for new concerns or questions  Notify RN Care Manager of [facility] call rescheduling needs Participate in Transition of Care Program/Attend TOC scheduled calls Take medications as prescribed   09/20/23 Reviewed AVS with patient today 09/20/23 Patient encouraged to wear mask was positive for COVID 19 and patient states MD thought it may have been with her 1st hospitalization but test wasn't done. 09/27/23 Try foods rich in Magnesium  - states she only like certain greens and never was able to tolerate spinach. 10/17/23 Continue to drink plenty of fluids but take it slowly, stop when feeling full or nauseous. Eat small portions as tolerated and take pills one at a time as tolerated.  Plan:  Follow up with provider re: post hospital follow up and loss of appetite with appointment 09/04/23 with Dr. Benjamine Telephone  follow up appointment with care management team member scheduled for:  09/10/23 at 11:00 am The care management team will reach out to the patient again over the next 5-10 business days. The patient has been provided with contact information for the care management team and has been advised to call with any health  related questions or concerns.  Continue to eat slowly and follow up with PCP if symptoms worsen for nausea and vomiting to access if new medications is helping for appetite.  09/20/23 Restarted program - Follow up in a week to check progress and changes. 09/27/23 Discussed Magnesium  rich foods, continue to monitor weight with low appetite, call PCP for any s/s that are worsening with appetite, changes in bowels, s/s of infections fever, chills.    10/04/23 patient was unable to keep appointment as she forgot she had a hair appointment. 10/08/23 Follow up next week telephonically 10/16/23 10/16/23 Completed 30 day TOC program, to be referred to longitudinal RN CCM.        Patient verbalizes understanding of instructions and care plan provided today and agrees to view in MyChart. Active MyChart status and patient understanding of how to access instructions and care plan via MyChart confirmed with patient.     The patient has been provided with contact information for the care management team and has been advised to call with any health related questions or concerns.  The care management team will reach out to the patient again over the next 30  days.  Follow up with provider re: 10/25/23 with Dr. Benjamine and ask about your Naproxen and Protonix . 10/17/23 Discussed ongoing follow up and with Longitudinal RN.  Patient agrees with ongoing with RN CCM follow up.  The patient has been provided with contact information for the care management team and has been advised to call with any health -related questions or concerns.  The patient verbalized understanding with current plan of care.  The patient is directed to their insurance card regarding availability of benefits coverage.    Please call the care guide team at [phone] if you need to cancel or reschedule your appointment.   Please call the [place]  National Suicide Prevention Lifeline: (205) 846-2038 or TTY: 825-718-9828 TTY (680) 634-5078) to talk to a  trained counselor if you are experiencing a Mental Health or Behavioral Health Crisis or need someone to talk to.  Richerd Fish, RN, BSN, CCM [facility], [facility] Health RN Care Manager Direct Dial: [phone]

## 2023-10-17 NOTE — Transitions of Care (Post Inpatient/ED Visit) (Signed)
 Transition of Care week 4  Visit Note  10/17/2023  Name: Angie Carr MRN: [id number]          DOB: 1955-11-09  Situation: Patient enrolled in [facility] 30-day program. Visit completed with patient by telephone.   Background:   Initial Transition Care Management Follow-up Telephone Call Discharge Date and Diagnosis: 09/19/23, Dehydration secondary to COVID - 19   Past Medical History:  Diagnosis Date   Anemia    Anxiety    Asthma    pt denies   Diabetes mellitus without complication (HCC)    DJD (degenerative joint disease)    lower back   GERD (gastroesophageal reflux disease)    Headache    miagraines, none in years   Hypertension    LBP (low back pain)    Morbid obesity (HCC)    Neuromuscular disorder (HCC)    feet tingle and her balance is sometimes a problem   Primary osteoarthritis of both knees    Smoker     Assessment: Patient Reported Symptoms: Cognitive Cognitive Status: Able to follow simple commands, Alert and oriented to person, place, and time, Normal speech and language skills      Neurological Neurological Review of Symptoms: No symptoms reported Neurological Management Strategies: Activity, Adequate rest, Medication therapy, Medical device Neurological Self-Management Outcome: 4 (good)  HEENT HEENT Symptoms Reported: No symptoms reported HEENT Self-Management Outcome: 4 (good)    Cardiovascular Cardiovascular Symptoms Reported: No symptoms reported Is patient checking Blood Pressure at home?: Yes    Respiratory Respiratory Symptoms Reported: No symptoms reported    Endocrine Endocrine Symptoms Reported: No symptoms reported Is patient diabetic?: Yes Is patient checking blood sugars at home?: No (patient has new meter but need test strips not received, patient states A1C was 5.2) List most recent blood sugar readings, include date and time of day: patient encouraged to get test strips for new meter and for follow up A1C is recommended for every  3 months Endocrine Self-Management Outcome: 3 (uncertain)  Gastrointestinal Gastrointestinal Symptoms Reported: No symptoms reported (No symptoms last few days notices when she takes more than 1 pill at a time or try to eat more, that she will vomit. Continue to encourage small meals as tolerated and taking 1 pill at a time.) Additional Gastrointestinal Details: States appetite has been bad since having COVID as well. Gastrointestinal Management Strategies: Activity, Adequate rest, Diet modification, Fluid modification, Medication therapy (Taking Remeron  as prescribed, thinks it's helping some.)    Genitourinary Genitourinary Symptoms Reported: Incontinence (chronically dribbles at time, denies any urinary symptoms) Genitourinary Management Strategies: Incontinence garment/pad, Fluid modification Genitourinary Self-Management Outcome: 4 (good)  Integumentary Integumentary Symptoms Reported: No symptoms reported, Incision Additional Integumentary Details: has healed , will see Ortho MD appointment today Skin Management Strategies: Activity, Adequate rest Skin Self-Management Outcome: 4 (good)  Musculoskeletal Musculoskelatal Symptoms Reviewed: No symptoms reported Additional Musculoskeletal Details: better Musculoskeletal Management Strategies: Activity, Adequate rest, Medical device, Medication therapy Musculoskeletal Self-Management Outcome: 4 (good)      Psychosocial Psychosocial Symptoms Reported: No symptoms reported (States, Not anxious or depressed but feel disappointed that I can't eat and enjoy my food like I used to but I am taking it and getting better one day at a time without feeling depressed)         Vitals:   10/17/23 1106  BP: 107/79  Pulse: 84    Medications Reviewed Today     Reviewed by Eilleen Richerd GRADE, RN (Registered Nurse) on 10/17/23 at 1124  Med List Status: <None>   Medication Order Taking? Sig Documenting Provider Last Dose Status Informant  apixaban   (ELIQUIS ) 5 MG TABS tablet [id number] Yes Take 2 tablets (10 mg total) by mouth 2 (two) times daily for 7 days, THEN 1 tablet (5 mg total) 2 (two) times daily. Willette Adriana LABOR, MD  Active   cilostazol  (PLETAL ) 50 MG tablet [id number] Yes Take 50 mg by mouth 2 (two) times daily. [provider]  Active Self, Pharmacy Records           Med Note (WHITE, DONETA GORMAN Repress Sep 15, 2023 11:13 PM)    diphenoxylate-atropine (LOMOTIL) 2.5-0.025 MG tablet [id number]  Take 1 tablet by mouth 4 (four) times daily as needed for diarrhea or loose stools.  Patient not taking: Reported on 09/20/2023   [provider]  Active Self, Pharmacy Records           Med Note [facility], DONETA GORMAN Repress Sep 15, 2023 11:13 PM)    fesoterodine  (TOVIAZ ) 4 MG TB24 tablet [id number]  Take 4 mg by mouth daily. [provider]  Active Self, Pharmacy Records           Med Note [facility], DONETA GORMAN Repress Sep 15, 2023 11:13 PM)    gabapentin  (NEURONTIN ) 400 MG capsule [id number] Yes Take 400 mg by mouth See admin instructions. Take 1 capsule (400 mg) by mouth in the morning & take 2 capsules (800 mg) by mouth in the evening. [provider]  Active Self, Pharmacy Records           Med Note [facility], DONETA GORMAN Repress Sep 15, 2023 11:14 PM)    metFORMIN  (GLUCOPHAGE ) 1000 MG tablet [id number]  Take 1 tablet (1,000 mg total) by mouth 2 (two) times daily with a meal. Dennise Lavada POUR, MD  Active Self, Pharmacy Records           Med Note [facility], DONETA GORMAN Repress Sep 15, 2023 11:14 PM)    mirtazapine  (REMERON ) 7.5 MG tablet [id number] Yes Take 7.5 mg by mouth daily. [provider]  Active   naproxen (EC NAPROSYN) 500 MG EC tablet [id number]  Take 500 mg by mouth 2 (two) times daily with a meal.  Patient not taking: Reported on 10/08/2023   [provider]  Active            Med Note LESLY, RICHERD CINDERELLA Debar Oct 08, 2023 11:32 AM) Patient states she was taking this and was prescribed by Dr. Benjamine on 04/29/23 and  needs a refill due to tingling in both feet for neuropathy  nebivolol  (BYSTOLIC ) 5 MG tablet [id number] Yes Take 5 mg by mouth daily. [provider]  Active   pantoprazole  (PROTONIX ) 40 MG tablet [id number] Yes Take 1 tablet (40 mg total) by mouth daily. Willette Adriana LABOR, MD  Active   Potassium Chloride  ER 20 MEQ TBCR [id number] Yes Take 20 mEq by mouth in the morning. [provider]  Active Self, Pharmacy Records           Med Note [facility], DONETA GORMAN Repress Sep 15, 2023 11:15 PM)    pravastatin  (PRAVACHOL ) 40 MG tablet [id number] Yes Take 40 mg by mouth daily in the afternoon. [provider]  Active Self, Pharmacy Records            Recommendation:   PCP Follow-up Referral to: Longitudinal CCM program with RN CCM  Follow Up Plan:   Referral to RN Case Manager Closing From:  Transitions of Care Program Patient has met all care management goals. Care Management case will be closed. Patient has been provided contact information should new needs arise.   Richerd Fish, RN, BSN, CCM [facility], [facility] Health RN Care Manager Direct Dial: [phone]

## 2023-10-31 DIAGNOSIS — E1151 Type 2 diabetes mellitus with diabetic peripheral angiopathy without gangrene: Secondary | ICD-10-CM | POA: Diagnosis not present

## 2023-11-04 ENCOUNTER — Other Ambulatory Visit: Payer: Self-pay | Admitting: *Deleted

## 2023-11-04 ENCOUNTER — Encounter: Payer: Self-pay | Admitting: *Deleted

## 2023-11-08 ENCOUNTER — Encounter: Payer: Self-pay | Admitting: *Deleted

## 2023-11-08 ENCOUNTER — Other Ambulatory Visit: Payer: Self-pay | Admitting: *Deleted

## 2023-11-13 ENCOUNTER — Emergency Department (HOSPITAL_COMMUNITY)

## 2023-11-13 ENCOUNTER — Inpatient Hospital Stay (HOSPITAL_COMMUNITY)
Admission: EM | Admit: 2023-11-13 | Discharge: 2023-11-21 | DRG: 640 | Disposition: A | Discharge status: Home / Self Care | Attending: Internal Medicine | Admitting: Internal Medicine

## 2023-11-13 ENCOUNTER — Other Ambulatory Visit: Payer: Self-pay

## 2023-11-13 ENCOUNTER — Encounter (HOSPITAL_COMMUNITY): Payer: Self-pay

## 2023-11-13 DIAGNOSIS — I10 Essential (primary) hypertension: Secondary | ICD-10-CM | POA: Diagnosis present

## 2023-11-13 DIAGNOSIS — K2100 Gastro-esophageal reflux disease with esophagitis, without bleeding: Secondary | ICD-10-CM | POA: Diagnosis present

## 2023-11-13 DIAGNOSIS — R1314 Dysphagia, pharyngoesophageal phase: Secondary | ICD-10-CM | POA: Diagnosis present

## 2023-11-13 DIAGNOSIS — E860 Dehydration: Secondary | ICD-10-CM | POA: Diagnosis present

## 2023-11-13 DIAGNOSIS — K5900 Constipation, unspecified: Secondary | ICD-10-CM | POA: Diagnosis present

## 2023-11-13 DIAGNOSIS — E876 Hypokalemia: Principal | ICD-10-CM | POA: Diagnosis present

## 2023-11-13 DIAGNOSIS — Z9049 Acquired absence of other specified parts of digestive tract: Secondary | ICD-10-CM

## 2023-11-13 DIAGNOSIS — R000 Tachycardia, unspecified: Secondary | ICD-10-CM | POA: Diagnosis present

## 2023-11-13 DIAGNOSIS — M170 Bilateral primary osteoarthritis of knee: Secondary | ICD-10-CM | POA: Diagnosis present

## 2023-11-13 DIAGNOSIS — Z1152 Encounter for screening for COVID-19: Secondary | ICD-10-CM

## 2023-11-13 DIAGNOSIS — F17210 Nicotine dependence, cigarettes, uncomplicated: Secondary | ICD-10-CM | POA: Diagnosis present

## 2023-11-13 DIAGNOSIS — I714 Abdominal aortic aneurysm, without rupture, unspecified: Secondary | ICD-10-CM | POA: Diagnosis not present

## 2023-11-13 DIAGNOSIS — D649 Anemia, unspecified: Secondary | ICD-10-CM | POA: Diagnosis present

## 2023-11-13 DIAGNOSIS — R42 Dizziness and giddiness: Secondary | ICD-10-CM

## 2023-11-13 DIAGNOSIS — Z79899 Other long term (current) drug therapy: Secondary | ICD-10-CM

## 2023-11-13 DIAGNOSIS — I7121 Aneurysm of the ascending aorta, without rupture: Secondary | ICD-10-CM | POA: Diagnosis present

## 2023-11-13 DIAGNOSIS — E43 Unspecified severe protein-calorie malnutrition: Secondary | ICD-10-CM | POA: Diagnosis present

## 2023-11-13 DIAGNOSIS — Z96642 Presence of left artificial hip joint: Secondary | ICD-10-CM | POA: Diagnosis present

## 2023-11-13 DIAGNOSIS — F419 Anxiety disorder, unspecified: Secondary | ICD-10-CM | POA: Diagnosis present

## 2023-11-13 DIAGNOSIS — R1319 Other dysphagia: Secondary | ICD-10-CM

## 2023-11-13 DIAGNOSIS — Z7902 Long term (current) use of antithrombotics/antiplatelets: Secondary | ICD-10-CM

## 2023-11-13 DIAGNOSIS — R531 Weakness: Secondary | ICD-10-CM

## 2023-11-13 DIAGNOSIS — Z7984 Long term (current) use of oral hypoglycemic drugs: Secondary | ICD-10-CM

## 2023-11-13 DIAGNOSIS — Z86711 Personal history of pulmonary embolism: Secondary | ICD-10-CM

## 2023-11-13 DIAGNOSIS — R933 Abnormal findings on diagnostic imaging of other parts of digestive tract: Secondary | ICD-10-CM

## 2023-11-13 DIAGNOSIS — K219 Gastro-esophageal reflux disease without esophagitis: Secondary | ICD-10-CM | POA: Diagnosis not present

## 2023-11-13 DIAGNOSIS — E119 Type 2 diabetes mellitus without complications: Secondary | ICD-10-CM

## 2023-11-13 DIAGNOSIS — Z6828 Body mass index (BMI) 28.0-28.9, adult: Secondary | ICD-10-CM

## 2023-11-13 DIAGNOSIS — K222 Esophageal obstruction: Secondary | ICD-10-CM

## 2023-11-13 DIAGNOSIS — Z90710 Acquired absence of both cervix and uterus: Secondary | ICD-10-CM

## 2023-11-13 DIAGNOSIS — K224 Dyskinesia of esophagus: Secondary | ICD-10-CM | POA: Diagnosis present

## 2023-11-13 DIAGNOSIS — E1151 Type 2 diabetes mellitus with diabetic peripheral angiopathy without gangrene: Secondary | ICD-10-CM | POA: Diagnosis present

## 2023-11-13 DIAGNOSIS — Z8249 Family history of ischemic heart disease and other diseases of the circulatory system: Secondary | ICD-10-CM

## 2023-11-13 DIAGNOSIS — L89152 Pressure ulcer of sacral region, stage 2: Secondary | ICD-10-CM | POA: Diagnosis present

## 2023-11-13 DIAGNOSIS — Z7901 Long term (current) use of anticoagulants: Secondary | ICD-10-CM

## 2023-11-13 DIAGNOSIS — K449 Diaphragmatic hernia without obstruction or gangrene: Secondary | ICD-10-CM

## 2023-11-13 LAB — CBC
HCT: 34 % — ABNORMAL LOW (ref 36.0–46.0)
Hemoglobin: 11.5 g/dL — ABNORMAL LOW (ref 12.0–15.0)
MCH: 32.3 pg (ref 26.0–34.0)
MCHC: 33.8 g/dL (ref 30.0–36.0)
MCV: 95.5 fL (ref 80.0–100.0)
Platelets: 164 K/uL (ref 150–400)
RBC: 3.56 MIL/uL — ABNORMAL LOW (ref 3.87–5.11)
RDW: 17.4 % — ABNORMAL HIGH (ref 11.5–15.5)
WBC: 5.1 K/uL (ref 4.0–10.5)
nRBC: 0 % (ref 0.0–0.2)

## 2023-11-13 LAB — COMPREHENSIVE METABOLIC PANEL WITH GFR
ALT: 16 U/L (ref 0–44)
AST: 23 U/L (ref 15–41)
Albumin: 2.9 g/dL — ABNORMAL LOW (ref 3.5–5.0)
Alkaline Phosphatase: 65 U/L (ref 38–126)
Anion gap: 22 — ABNORMAL HIGH (ref 5–15)
BUN: 15 mg/dL (ref 8–23)
CO2: 15 mmol/L — ABNORMAL LOW (ref 22–32)
Calcium: 6.9 mg/dL — ABNORMAL LOW (ref 8.9–10.3)
Chloride: 107 mmol/L (ref 98–111)
Creatinine, Ser: 1.26 mg/dL — ABNORMAL HIGH (ref 0.44–1.00)
GFR, Estimated: 46 mL/min — ABNORMAL LOW (ref >60)
Glucose, Bld: 81 mg/dL (ref 70–99)
Potassium: 3.3 mmol/L — ABNORMAL LOW (ref 3.5–5.1)
Sodium: 144 mmol/L (ref 135–145)
Total Bilirubin: 2.1 mg/dL — ABNORMAL HIGH (ref 0.0–1.2)
Total Protein: 6 g/dL — ABNORMAL LOW (ref 6.5–8.1)

## 2023-11-13 LAB — I-STAT CHEM 8, ED
BUN: 14 mg/dL (ref 8–23)
Calcium, Ion: 0.93 mmol/L — ABNORMAL LOW (ref 1.15–1.40)
Chloride: 108 mmol/L (ref 98–111)
Creatinine, Ser: 0.8 mg/dL (ref 0.44–1.00)
Glucose, Bld: 80 mg/dL (ref 70–99)
HCT: 35 % — ABNORMAL LOW (ref 36.0–46.0)
Hemoglobin: 11.9 g/dL — ABNORMAL LOW (ref 12.0–15.0)
Potassium: 2.4 mmol/L — CL (ref 3.5–5.1)
Sodium: 144 mmol/L (ref 135–145)
TCO2: 19 mmol/L — ABNORMAL LOW (ref 22–32)

## 2023-11-13 LAB — RESP PANEL BY RT-PCR (RSV, FLU A&B, COVID)  RVPGX2
Influenza A by PCR: NEGATIVE
Influenza B by PCR: NEGATIVE
Resp Syncytial Virus by PCR: NEGATIVE
SARS Coronavirus 2 by RT PCR: NEGATIVE

## 2023-11-13 LAB — TROPONIN I (HIGH SENSITIVITY)
Troponin I (High Sensitivity): 17 ng/L (ref <18)
Troponin I (High Sensitivity): 20 ng/L — ABNORMAL HIGH (ref <18)

## 2023-11-13 LAB — CBG MONITORING, ED: Glucose-Capillary: 83 mg/dL (ref 70–99)

## 2023-11-13 LAB — MAGNESIUM: Magnesium: 0.5 mg/dL — CL (ref 1.7–2.4)

## 2023-11-13 MED ORDER — MAGNESIUM OXIDE -MG SUPPLEMENT 400 (240 MG) MG PO TABS
800.0000 mg | ORAL_TABLET | Freq: Once | ORAL | Status: AC
  Administered 2023-11-13: 800 mg via ORAL
  Filled 2023-11-13: qty 2

## 2023-11-13 MED ORDER — SODIUM CHLORIDE 0.9 % IV BOLUS
1000.0000 mL | Freq: Once | INTRAVENOUS | Status: AC
  Administered 2023-11-13: 1000 mL via INTRAVENOUS

## 2023-11-13 MED ORDER — POTASSIUM CHLORIDE CRYS ER 20 MEQ PO TBCR
40.0000 meq | EXTENDED_RELEASE_TABLET | Freq: Once | ORAL | Status: AC
  Administered 2023-11-13: 40 meq via ORAL
  Filled 2023-11-13: qty 2

## 2023-11-13 MED ORDER — IOHEXOL 350 MG/ML SOLN
75.0000 mL | Freq: Once | INTRAVENOUS | Status: AC | PRN
  Administered 2023-11-13: 75 mL via INTRAVENOUS

## 2023-11-13 MED ORDER — ONDANSETRON HCL 4 MG/2ML IJ SOLN
4.0000 mg | Freq: Four times a day (QID) | INTRAMUSCULAR | Status: DC | PRN
  Administered 2023-11-13 – 2023-11-18 (×6): 4 mg via INTRAVENOUS
  Filled 2023-11-13 (×6): qty 2

## 2023-11-13 MED ORDER — ACETAMINOPHEN 325 MG PO TABS: 650.0000 mg | ORAL_TABLET | Freq: Four times a day (QID) | ORAL | Status: DC | PRN

## 2023-11-13 MED ORDER — POTASSIUM CHLORIDE 10 MEQ/100ML IV SOLN
10.0000 meq | Freq: Once | INTRAVENOUS | Status: AC
  Administered 2023-11-13: 10 meq via INTRAVENOUS

## 2023-11-13 MED ORDER — BISACODYL 5 MG PO TBEC
5.0000 mg | DELAYED_RELEASE_TABLET | Freq: Every day | ORAL | Status: DC | PRN
Start: 2023-11-13 — End: 2023-11-16

## 2023-11-13 MED ORDER — MAGNESIUM SULFATE 2 GM/50ML IV SOLN
2.0000 g | Freq: Once | INTRAVENOUS | Status: AC
  Administered 2023-11-13: 2 g via INTRAVENOUS
  Filled 2023-11-13: qty 50

## 2023-11-13 MED ORDER — SENNOSIDES-DOCUSATE SODIUM 8.6-50 MG PO TABS: 1.0000 | ORAL_TABLET | Freq: Every evening | ORAL | Status: DC | PRN

## 2023-11-13 MED ORDER — ONDANSETRON HCL 4 MG PO TABS
4.0000 mg | ORAL_TABLET | Freq: Four times a day (QID) | ORAL | Status: DC | PRN
Start: 2023-11-13 — End: 2023-11-18
  Administered 2023-11-17: 4 mg via ORAL
  Filled 2023-11-13 (×2): qty 1

## 2023-11-13 MED ORDER — ACETAMINOPHEN 650 MG RE SUPP: 650.0000 mg | Freq: Four times a day (QID) | RECTAL | Status: DC | PRN

## 2023-11-13 MED ORDER — POTASSIUM CHLORIDE 10 MEQ/100ML IV SOLN
10.0000 meq | INTRAVENOUS | Status: DC
  Administered 2023-11-13 (×3): 10 meq via INTRAVENOUS
  Filled 2023-11-13 (×4): qty 100

## 2023-11-13 NOTE — ED Notes (Signed)
 Contacted ccmd to put pt on the monitor.

## 2023-11-13 NOTE — ED Triage Notes (Signed)
 Pt BIB EMS from home for weakness, dizziness, and dry heaving. EMS reports heart rate went up to 190's en route then back to 100-120. Pt reports PCP took pt off of all medications except Eliquis  recently. Pt saw GI provider today.

## 2023-11-13 NOTE — H&P (Addendum)
 History and Physical  Angie Carr FMW:[id number] DOB: [birth year] DOA: 11/13/2023  PCP: Benjamine Aland, MD   Chief Complaint: Dizziness and weakness  HPI: Angie Carr is a 68 y.o. female with medical history significant for HTN, T2DM, PE on Eliquis , PAD, GERD, DJD, arthritis and anemia who presented to the ED for evaluation of dizziness and weakness. Patient reports she was found to have low magnesium  and potassium during a recent hospitalization in September.  She was started on supplementation however they were discontinued by her PCP last week. Since then, she has not felt well, endorsing lightheadedness, generalized weakness, muscle aches and poor appetite. She also reports chronic foot swelling and intermittent abdominal cramps but denies any fevers, chills, nausea, chest pain, shortness of breath, headache or vision changes.  ED Course: Initial vitals show patient afebrile, soft BP with SBP 100-110s, SpO2 100% on room air. Initial labs significant for K+ 2.4, mag 0.5, troponin 17-20, WBC 5.1, Hgb 11.5, normal renal function, negative flu, RSV and COVID test. EKG shows sinus rhythm with occasional PVCs and PACs. CTA chest PE study negative for PE but shows a 4.1 cm ascending aortic aneurysm. Pt received IV NS 1 L bolus, IV and oral potassium and IV mag. TRH was consulted for admission.   Review of Systems: Please see HPI for pertinent positives and negatives. A complete 10 system review of systems are otherwise negative.  Past Medical History:  Diagnosis Date   Anemia    Anxiety    Asthma    pt denies   Diabetes mellitus without complication (HCC)    DJD (degenerative joint disease)    lower back   GERD (gastroesophageal reflux disease)    Headache    miagraines, none in years   Hypertension    LBP (low back pain)    Morbid obesity (HCC)    Neuromuscular disorder (HCC)    feet tingle and her balance is sometimes a problem   Primary osteoarthritis of both knees     Smoker    Past Surgical History:  Procedure Laterality Date   ABDOMINAL HYSTERECTOMY     complete   CHOLECYSTECTOMY     laparascopic   COLONOSCOPY N/A 02/11/2014   Procedure: COLONOSCOPY;  Surgeon: Lamar Donnald GAILS, MD;  Location: [facility];  Service: Endoscopy;  Laterality: N/A;   HERNIA REPAIR     ventral on left side.   TOTAL HIP ARTHROPLASTY Left 07/24/2023   Procedure: ARTHROPLASTY, HIP, TOTAL,POSTERIOR APPROACH;  Surgeon: Edna Toribio LABOR, MD;  Location: [facility];  Service: Orthopedics;  Laterality: Left;   TUBAL LIGATION     Social History:  reports that she has been smoking cigarettes. She has never used smokeless tobacco. She reports current drug use. Drug: Marijuana. She reports that she does not drink alcohol .  No Known Allergies  Family History  Problem Relation Age of Onset   Hypertension Other    Stroke Other      Prior to Admission medications   Medication Sig Start Date End Date Taking? Authorizing Provider  apixaban  (ELIQUIS ) 5 MG TABS tablet Take 2 tablets (10 mg total) by mouth 2 (two) times daily for 7 days, THEN 1 tablet (5 mg total) 2 (two) times daily. 08/30/23 12/05/23  Willette Adriana LABOR, MD  cilostazol  (PLETAL ) 50 MG tablet Take 50 mg by mouth 2 (two) times daily.    [provider]  diphenoxylate-atropine (LOMOTIL) 2.5-0.025 MG tablet Take 1 tablet by mouth 4 (four) times daily as needed for diarrhea  or loose stools. 08/22/23   [provider]  fesoterodine  (TOVIAZ ) 4 MG TB24 tablet Take 4 mg by mouth daily. Patient not taking: Reported on 11/08/2023 06/28/23   [provider]  gabapentin  (NEURONTIN ) 400 MG capsule Take 400 mg by mouth See admin instructions. Take 1 capsule (400 mg) by mouth in the morning & take 2 capsules (800 mg) by mouth in the evening. Patient not taking: Reported on 11/08/2023    [provider]  metFORMIN  (GLUCOPHAGE ) 1000 MG tablet Take 1 tablet (1,000 mg total) by mouth 2 (two) times daily with a  meal. Patient not taking: Reported on 11/08/2023 12/16/14   Dennise Lavada POUR, MD  mirtazapine  (REMERON ) 7.5 MG tablet Take 7.5 mg by mouth daily. Patient not taking: Reported on 11/08/2023    [provider]  naproxen (EC NAPROSYN) 500 MG EC tablet Take 500 mg by mouth 2 (two) times daily with a meal. Patient not taking: Reported on 11/08/2023    [provider]  nebivolol  (BYSTOLIC ) 5 MG tablet Take 5 mg by mouth daily. Patient not taking: Reported on 11/08/2023    [provider]  omeprazole  (PRILOSEC) 20 MG capsule Take 20 mg by mouth daily. 11/07/23   [provider]  pantoprazole  (PROTONIX ) 40 MG tablet Take 1 tablet (40 mg total) by mouth daily. Patient not taking: Reported on 11/08/2023 08/30/23 11/28/23  Willette Adriana LABOR, MD  Potassium Chloride  ER 20 MEQ TBCR Take 20 mEq by mouth in the morning. Patient not taking: Reported on 11/08/2023 06/28/23   [provider]  pravastatin  (PRAVACHOL ) 40 MG tablet Take 40 mg by mouth daily in the afternoon. Patient not taking: Reported on 11/08/2023    [provider]    Physical Exam: BP 126/77 (BP Location: Left Arm)   Pulse 90   Temp 98.8 F (37.1 C)   Resp 16   Ht 5' 4 (1.626 m)   Wt 77.8 kg   SpO2 100%   BMI 29.44 kg/m  General: Pleasant, weak appearing elderly woman laying in bed. No acute distress. HEENT: [place]/AT. Anicteric sclera.  Dry mucous membrane CV: Regular rate. Regular rhythm with occasional extra beats. No murmurs, rubs, or gallops. Pulmonary: Lungs CTAB. Normal effort. No wheezing or rales. Abdominal: Soft, nontender, nondistended. Normal bowel sounds. Extremities: Trace edema up to the ankles. Faint DP pulses. Skin: Warm and dry. No obvious rash or lesions. Neuro: A&Ox3. Moves all extremities. Normal sensation to light touch. No focal deficit. Psych: Normal mood and affect          Labs on Admission:  Basic Metabolic Panel: Recent Labs  Lab 11/13/23 1626  11/13/23 1749 11/13/23 1807  NA 144  --  144  K 3.3*  --  2.4*  CL 107  --  108  CO2 15*  --   --   GLUCOSE 81  --  80  BUN 15  --  14  CREATININE 1.26*  --  0.80  CALCIUM  6.9*  --   --   MG  --  0.5*  --    Liver Function Tests: Recent Labs  Lab 11/13/23 1626  AST 23  ALT 16  ALKPHOS 65  BILITOT 2.1*  PROT 6.0*  ALBUMIN 2.9*   No results for input(s): LIPASE, AMYLASE in the last 168 hours. No results for input(s): AMMONIA in the last 168 hours. CBC: Recent Labs  Lab 11/13/23 1626 11/13/23 1807  WBC 5.1  --   HGB 11.5* 11.9*  HCT  34.0* 35.0*  MCV 95.5  --   PLT 164  --    Cardiac Enzymes: No results for input(s): CKTOTAL, CKMB, CKMBINDEX, TROPONINI in the last 168 hours. BNP (last 3 results) No results for input(s): BNP in the last 8760 hours.  ProBNP (last 3 results) No results for input(s): PROBNP in the last 8760 hours.  CBG: Recent Labs  Lab 11/13/23 1632  GLUCAP 83    Radiological Exams on Admission: CT Angio Chest PE W and/or Wo Contrast Result Date: 11/13/2023 CLINICAL DATA:  High probability for PE.  Acute abdominal pain. EXAM: CT ANGIOGRAPHY CHEST CT ABDOMEN AND PELVIS WITH CONTRAST TECHNIQUE: Multidetector CT imaging of the chest was performed using the standard protocol during bolus administration of intravenous contrast. Multiplanar CT image reconstructions and MIPs were obtained to evaluate the vascular anatomy. Multidetector CT imaging of the abdomen and pelvis was performed using the standard protocol during bolus administration of intravenous contrast. RADIATION DOSE REDUCTION: This exam was performed according to the departmental dose-optimization program which includes automated exposure control, adjustment of the mA and/or kV according to patient size and/or use of iterative reconstruction technique. CONTRAST:  75mL OMNIPAQUE  IOHEXOL  350 MG/ML SOLN COMPARISON:  CT abdomen and pelvis 09/15/2014. CT angiogram chest 08/28/2023.  FINDINGS: CTA CHEST FINDINGS Cardiovascular: The ascending aorta is dilated measuring 4.1 cm. There are atherosclerotic calcifications of the aorta and coronary arteries. The heart is mildly enlarged. There is no pericardial effusion. There is adequate opacification of the pulmonary arteries to the segmental level. The main pulmonary artery is dilated, unchanged. No pulmonary embolism identified. Mediastinum/Nodes: No enlarged mediastinal, hilar, or axillary lymph nodes. Thyroid gland, trachea, and esophagus demonstrate no significant findings. Lungs/Pleura: Mild emphysema present. The lungs are otherwise clear. No pleural effusion or pneumothorax. Musculoskeletal: No chest wall abnormality. No acute or significant osseous findings. Review of the MIP images confirms the above findings. CT ABDOMEN and PELVIS FINDINGS Hepatobiliary: There is fatty infiltration of the liver. The gallbladder surgically absent. There is no biliary ductal dilatation. Pancreas: Unremarkable. No pancreatic ductal dilatation or surrounding inflammatory changes. Spleen: Normal in size without focal abnormality. Adrenals/Urinary Tract: The bladder is grossly within normal limits, but evaluation is limited secondary to streak artifact in the pelvis. The bilateral kidneys adrenal glands appear normal. Stomach/Bowel: Stomach is within normal limits. No evidence of bowel wall thickening, distention, or inflammatory changes. There are scattered colonic diverticula. The appendix is not visualized. Vascular/Lymphatic: Aortic atherosclerosis. No enlarged abdominal or pelvic lymph nodes. Reproductive: Status post hysterectomy. No adnexal masses. Other: Of the level of the umbilicus there is a fat containing ventral hernia which is small in size. In the upper abdomen there is a small midline ventral hernia containing nondilated bowel loops. Appearance is similar to prior. There is no ascites. Musculoskeletal: Left hip arthroplasty is present. There  severe degenerative changes of the right hip. Review of the MIP images confirms the above findings. IMPRESSION: 1. No evidence for pulmonary embolism. 2. Stable dilatation of the main pulmonary artery compatible with pulmonary arterial hypertension. 3. 4.1 cm ascending aortic aneurysm. Recommend annual imaging followup by CTA or MRA. This recommendation follows 2010 ACCF/AHA/AATS/ACR/ASA/SCA/SCAI/SIR/STS/SVM Guidelines for the Diagnosis and Management of Patients with Thoracic Aortic Disease. Circulation. 2010; 121: Z733-z630. Aortic aneurysm NOS (ICD10-I71.9) 4. No acute localizing process in the abdomen or pelvis. 5. Colonic diverticulosis. 6. Fatty infiltration of the liver. 7. Stable ventral hernias. Aortic Atherosclerosis (ICD10-I70.0). Electronically Signed   By: Greig Pique M.D.   On: 11/13/2023 19:09  CT ABDOMEN PELVIS W CONTRAST Result Date: 11/13/2023 CLINICAL DATA:  High probability for PE.  Acute abdominal pain. EXAM: CT ANGIOGRAPHY CHEST CT ABDOMEN AND PELVIS WITH CONTRAST TECHNIQUE: Multidetector CT imaging of the chest was performed using the standard protocol during bolus administration of intravenous contrast. Multiplanar CT image reconstructions and MIPs were obtained to evaluate the vascular anatomy. Multidetector CT imaging of the abdomen and pelvis was performed using the standard protocol during bolus administration of intravenous contrast. RADIATION DOSE REDUCTION: This exam was performed according to the departmental dose-optimization program which includes automated exposure control, adjustment of the mA and/or kV according to patient size and/or use of iterative reconstruction technique. CONTRAST:  75mL OMNIPAQUE  IOHEXOL  350 MG/ML SOLN COMPARISON:  CT abdomen and pelvis 09/15/2014. CT angiogram chest 08/28/2023. FINDINGS: CTA CHEST FINDINGS Cardiovascular: The ascending aorta is dilated measuring 4.1 cm. There are atherosclerotic calcifications of the aorta and coronary arteries. The  heart is mildly enlarged. There is no pericardial effusion. There is adequate opacification of the pulmonary arteries to the segmental level. The main pulmonary artery is dilated, unchanged. No pulmonary embolism identified. Mediastinum/Nodes: No enlarged mediastinal, hilar, or axillary lymph nodes. Thyroid gland, trachea, and esophagus demonstrate no significant findings. Lungs/Pleura: Mild emphysema present. The lungs are otherwise clear. No pleural effusion or pneumothorax. Musculoskeletal: No chest wall abnormality. No acute or significant osseous findings. Review of the MIP images confirms the above findings. CT ABDOMEN and PELVIS FINDINGS Hepatobiliary: There is fatty infiltration of the liver. The gallbladder surgically absent. There is no biliary ductal dilatation. Pancreas: Unremarkable. No pancreatic ductal dilatation or surrounding inflammatory changes. Spleen: Normal in size without focal abnormality. Adrenals/Urinary Tract: The bladder is grossly within normal limits, but evaluation is limited secondary to streak artifact in the pelvis. The bilateral kidneys adrenal glands appear normal. Stomach/Bowel: Stomach is within normal limits. No evidence of bowel wall thickening, distention, or inflammatory changes. There are scattered colonic diverticula. The appendix is not visualized. Vascular/Lymphatic: Aortic atherosclerosis. No enlarged abdominal or pelvic lymph nodes. Reproductive: Status post hysterectomy. No adnexal masses. Other: Of the level of the umbilicus there is a fat containing ventral hernia which is small in size. In the upper abdomen there is a small midline ventral hernia containing nondilated bowel loops. Appearance is similar to prior. There is no ascites. Musculoskeletal: Left hip arthroplasty is present. There severe degenerative changes of the right hip. Review of the MIP images confirms the above findings. IMPRESSION: 1. No evidence for pulmonary embolism. 2. Stable dilatation of the  main pulmonary artery compatible with pulmonary arterial hypertension. 3. 4.1 cm ascending aortic aneurysm. Recommend annual imaging followup by CTA or MRA. This recommendation follows 2010 ACCF/AHA/AATS/ACR/ASA/SCA/SCAI/SIR/STS/SVM Guidelines for the Diagnosis and Management of Patients with Thoracic Aortic Disease. Circulation. 2010; 121: [id number]. Aortic aneurysm NOS (ICD10-I71.9) 4. No acute localizing process in the abdomen or pelvis. 5. Colonic diverticulosis. 6. Fatty infiltration of the liver. 7. Stable ventral hernias. Aortic Atherosclerosis (ICD10-I70.0). Electronically Signed   By: Greig Pique M.D.   On: 11/13/2023 19:09   Assessment/Plan Angie Carr is a 68 y.o. female with medical history significant for HTN, T2DM, PE on Eliquis , PAD, GERD, DJD, arthritis and anemia who presented to the ED for evaluation of dizziness and weakness and found to have electrolyte derangement.  # Hypokalemia - K+ low at 2.4 on admission - Likely secondary to hypomagnesemia - S/p total of 80 mEq KCl given in the ED - Give additional 40 mEq of oral KCl x 1 -  F/u morning potassium and mag  # Hypomagnesemia - Mag significantly low at 0.5, status post IV mag 2 g in the ED - Give mag oxide 800 mg x 1 - Follow-up morning mag and Phos  # T2DM - Well-controlled, last A1c 4.8% 3 months ago - Patient taken off metformin  by her PCP - SSI with meals, CBG monitoring  # Generalized weakness # Dizziness - Secondary to electrolyte derangement and dehydration - Patient reports improvement in dizziness after IV bolus in the ED - Continue IV hydration and electrolyte replacement as above - Check orthostatic vitals - PT/OT eval and treat  # History of pulmonary embolism - CTA chest PE study on admission does not show any pulmonary embolism - Continue Eliquis   # AAA - CTA chest shows a 4.1 cm ascending aortic aneurysm - Needs annual imaging follow-up by CTA or MRA - Consider referral to vascular  surgery at discharge  # PAD - Continue cilostazol   # GERD - Continue PPI  # Anxiety - Continue mirtazapine   DVT prophylaxis: Eliquis     Code Status: Full Code  Consults called: None  Family Communication: No family at bedside  Severity of Illness: The appropriate patient status for this patient is OBSERVATION. Observation status is judged to be reasonable and necessary in order to provide the required intensity of service to ensure the patient's safety. The patient's presenting symptoms, physical exam findings, and initial radiographic and laboratory data in the context of their medical condition is felt to place them at decreased risk for further clinical deterioration. Furthermore, it is anticipated that the patient will be medically stable for discharge from the hospital within 2 midnights of admission.   Level of care: Telemetry    Lou Claretta HERO, MD 11/14/2023, 1:43 AM [facility] Pager: [phone] Isaiah 41:10   If 7PM-7AM, please contact night-coverage [email or web address] Password [email or web address]

## 2023-11-13 NOTE — ED Notes (Signed)
 Assuming pt care, pt bib ems for dizziness and weakness x 1 wk, pt aaox4, uses walker to ambulate, skin warm/dry, pt reports med hx. Htn, dm, chl. Pt denies pain, call bell within reach

## 2023-11-13 NOTE — ED Notes (Signed)
PT aware a urine sample is needed.

## 2023-11-13 NOTE — ED Provider Notes (Signed)
 [place] EMERGENCY DEPARTMENT AT [facility] Provider Note   CSN: [id number] Arrival date & time: 11/13/23  1618     Patient presents with: Dizziness and Weakness   Angie Carr is a 68 y.o. female who presents with dizziness and weakness.  Patient states that she was admitted in September and was diagnosed with PE and also multiple electrolyte abnormalities.  Patient states that she was put on magnesium  and potassium.  She states that she recently saw her doctor and was taken off of those supplements.  She states that since then she has been feeling lightheaded and dizzy.  She also feels that she is dehydrated.  She states that she has poor appetite to begin with.  She denies any nausea or vomiting.  Patient does have some abdominal cramps.  Patient went to GI office and they had a hard time getting her blood work.  She felt that she needed to come here for IV fluids.  Of note patient came by EMS and her heart rate varies from 100 to 190s.    The history is provided by the patient.       Prior to Admission medications   Medication Sig Start Date End Date Taking? Authorizing Provider  apixaban  (ELIQUIS ) 5 MG TABS tablet Take 2 tablets (10 mg total) by mouth 2 (two) times daily for 7 days, THEN 1 tablet (5 mg total) 2 (two) times daily. 08/30/23 12/05/23  Willette Adriana LABOR, MD  cilostazol  (PLETAL ) 50 MG tablet Take 50 mg by mouth 2 (two) times daily.    [provider]  diphenoxylate-atropine (LOMOTIL) 2.5-0.025 MG tablet Take 1 tablet by mouth 4 (four) times daily as needed for diarrhea or loose stools. 08/22/23   [provider]  fesoterodine  (TOVIAZ ) 4 MG TB24 tablet Take 4 mg by mouth daily. Patient not taking: Reported on 11/08/2023 06/28/23   [provider]  gabapentin  (NEURONTIN ) 400 MG capsule Take 400 mg by mouth See admin instructions. Take 1 capsule (400 mg) by mouth in the morning & take 2 capsules (800 mg) by mouth in the  evening. Patient not taking: Reported on 11/08/2023    [provider]  metFORMIN  (GLUCOPHAGE ) 1000 MG tablet Take 1 tablet (1,000 mg total) by mouth 2 (two) times daily with a meal. Patient not taking: Reported on 11/08/2023 12/16/14   Dennise Lavada POUR, MD  mirtazapine  (REMERON ) 7.5 MG tablet Take 7.5 mg by mouth daily. Patient not taking: Reported on 11/08/2023    [provider]  naproxen (EC NAPROSYN) 500 MG EC tablet Take 500 mg by mouth 2 (two) times daily with a meal. Patient not taking: Reported on 11/08/2023    [provider]  nebivolol  (BYSTOLIC ) 5 MG tablet Take 5 mg by mouth daily. Patient not taking: Reported on 11/08/2023    [provider]  omeprazole  (PRILOSEC) 20 MG capsule Take 20 mg by mouth daily. 11/07/23   [provider]  pantoprazole  (PROTONIX ) 40 MG tablet Take 1 tablet (40 mg total) by mouth daily. Patient not taking: Reported on 11/08/2023 08/30/23 11/28/23  Willette Adriana LABOR, MD  Potassium Chloride  ER 20 MEQ TBCR Take 20 mEq by mouth in the morning. Patient not taking: Reported on 11/08/2023 06/28/23   [provider]  pravastatin  (PRAVACHOL ) 40 MG tablet Take 40 mg by mouth daily in the afternoon. Patient not taking: Reported on 11/08/2023    [provider]    Allergies: Patient has no known allergies.  Review of Systems  Neurological:  Positive for dizziness and weakness.  All other systems reviewed and are negative.   Updated Vital Signs BP 108/77 (BP Location: Right Arm)   Pulse 72   Temp 98.2 F (36.8 C) (Oral)   Resp 18   Ht 5' 4 (1.626 m)   Wt 74.4 kg   SpO2 100%   BMI 28.15 kg/m   Physical Exam Vitals and nursing note reviewed.  Constitutional:      Appearance: Normal appearance.  HENT:     Head: Normocephalic.     Nose: Nose normal.     Mouth/Throat:     Mouth: Mucous membranes are moist.  Eyes:     Extraocular Movements: Extraocular movements intact.     Pupils: Pupils are  equal, round, and reactive to light.  Cardiovascular:     Rate and Rhythm: Regular rhythm. Tachycardia present.     Pulses: Normal pulses.     Heart sounds: Normal heart sounds.  Pulmonary:     Effort: Pulmonary effort is normal.     Breath sounds: Normal breath sounds.  Abdominal:     General: Abdomen is flat.     Palpations: Abdomen is soft.  Musculoskeletal:        General: Normal range of motion.     Cervical back: Normal range of motion and neck supple.  Skin:    General: Skin is warm.     Capillary Refill: Capillary refill takes less than 2 seconds.  Neurological:     General: No focal deficit present.     Mental Status: She is alert and oriented to person, place, and time.  Psychiatric:        Mood and Affect: Mood normal.        Behavior: Behavior normal.     (all labs ordered are listed, but only abnormal results are displayed) Labs Reviewed  COMPREHENSIVE METABOLIC PANEL WITH GFR - Abnormal; Notable for the following components:      Result Value   Potassium 3.3 (*)    CO2 15 (*)    Creatinine, Ser 1.26 (*)    Calcium  6.9 (*)    Total Protein 6.0 (*)    Albumin 2.9 (*)    Total Bilirubin 2.1 (*)    GFR, Estimated 46 (*)    Anion gap 22 (*)    All other components within normal limits  CBC - Abnormal; Notable for the following components:   RBC 3.56 (*)    Hemoglobin 11.5 (*)    HCT 34.0 (*)    RDW 17.4 (*)    All other components within normal limits  MAGNESIUM  - Abnormal; Notable for the following components:   Magnesium  0.5 (*)    All other components within normal limits  I-STAT CHEM 8, ED - Abnormal; Notable for the following components:   Potassium 2.4 (*)    Calcium , Ion 0.93 (*)    TCO2 19 (*)    Hemoglobin 11.9 (*)    HCT 35.0 (*)    All other components within normal limits  RESP PANEL BY RT-PCR (RSV, FLU A&B, COVID)  [id number]  URINALYSIS, ROUTINE W REFLEX MICROSCOPIC  CBG MONITORING, ED  TROPONIN I (HIGH SENSITIVITY)  TROPONIN I (HIGH  SENSITIVITY)    EKG: EKG Interpretation Date/Time:  Wednesday November 13 2023 16:36:53 EST Ventricular Rate:  96 PR Interval:  156 QRS Duration:  74 QT Interval:  380 QTC Calculation: 480 R Axis:   4  Text Interpretation: Sinus  rhythm with occasional Premature ventricular complexes and Premature atrial complexes Low voltage QRS Cannot rule out Anterior infarct , age undetermined Abnormal ECG When compared with ECG of 15-Sep-2023 18:38, PREVIOUS ECG IS PRESENT Confirmed by Patt Angie DEL ([id number]) on 11/13/2023 4:58:01 PM  Radiology: CT Angio Chest PE W and/or Wo Contrast Result Date: 11/13/2023 CLINICAL DATA:  High probability for PE.  Acute abdominal pain. EXAM: CT ANGIOGRAPHY CHEST CT ABDOMEN AND PELVIS WITH CONTRAST TECHNIQUE: Multidetector CT imaging of the chest was performed using the standard protocol during bolus administration of intravenous contrast. Multiplanar CT image reconstructions and MIPs were obtained to evaluate the vascular anatomy. Multidetector CT imaging of the abdomen and pelvis was performed using the standard protocol during bolus administration of intravenous contrast. RADIATION DOSE REDUCTION: This exam was performed according to the departmental dose-optimization program which includes automated exposure control, adjustment of the mA and/or kV according to patient size and/or use of iterative reconstruction technique. CONTRAST:  75mL OMNIPAQUE  IOHEXOL  350 MG/ML SOLN COMPARISON:  CT abdomen and pelvis 09/15/2014. CT angiogram chest 08/28/2023. FINDINGS: CTA CHEST FINDINGS Cardiovascular: The ascending aorta is dilated measuring 4.1 cm. There are atherosclerotic calcifications of the aorta and coronary arteries. The heart is mildly enlarged. There is no pericardial effusion. There is adequate opacification of the pulmonary arteries to the segmental level. The main pulmonary artery is dilated, unchanged. No pulmonary embolism identified. Mediastinum/Nodes: No enlarged  mediastinal, hilar, or axillary lymph nodes. Thyroid gland, trachea, and esophagus demonstrate no significant findings. Lungs/Pleura: Mild emphysema present. The lungs are otherwise clear. No pleural effusion or pneumothorax. Musculoskeletal: No chest wall abnormality. No acute or significant osseous findings. Review of the MIP images confirms the above findings. CT ABDOMEN and PELVIS FINDINGS Hepatobiliary: There is fatty infiltration of the liver. The gallbladder surgically absent. There is no biliary ductal dilatation. Pancreas: Unremarkable. No pancreatic ductal dilatation or surrounding inflammatory changes. Spleen: Normal in size without focal abnormality. Adrenals/Urinary Tract: The bladder is grossly within normal limits, but evaluation is limited secondary to streak artifact in the pelvis. The bilateral kidneys adrenal glands appear normal. Stomach/Bowel: Stomach is within normal limits. No evidence of bowel wall thickening, distention, or inflammatory changes. There are scattered colonic diverticula. The appendix is not visualized. Vascular/Lymphatic: Aortic atherosclerosis. No enlarged abdominal or pelvic lymph nodes. Reproductive: Status post hysterectomy. No adnexal masses. Other: Of the level of the umbilicus there is a fat containing ventral hernia which is small in size. In the upper abdomen there is a small midline ventral hernia containing nondilated bowel loops. Appearance is similar to prior. There is no ascites. Musculoskeletal: Left hip arthroplasty is present. There severe degenerative changes of the right hip. Review of the MIP images confirms the above findings. IMPRESSION: 1. No evidence for pulmonary embolism. 2. Stable dilatation of the main pulmonary artery compatible with pulmonary arterial hypertension. 3. 4.1 cm ascending aortic aneurysm. Recommend annual imaging followup by CTA or MRA. This recommendation follows 2010 ACCF/AHA/AATS/ACR/ASA/SCA/SCAI/SIR/STS/SVM Guidelines for the  Diagnosis and Management of Patients with Thoracic Aortic Disease. Circulation. 2010; 121: Z733-z630. Aortic aneurysm NOS (ICD10-I71.9) 4. No acute localizing process in the abdomen or pelvis. 5. Colonic diverticulosis. 6. Fatty infiltration of the liver. 7. Stable ventral hernias. Aortic Atherosclerosis (ICD10-I70.0). Electronically Signed   By: Greig Pique M.D.   On: 11/13/2023 19:09   CT ABDOMEN PELVIS W CONTRAST Result Date: 11/13/2023 CLINICAL DATA:  High probability for PE.  Acute abdominal pain. EXAM: CT ANGIOGRAPHY CHEST CT ABDOMEN AND PELVIS WITH CONTRAST TECHNIQUE:  Multidetector CT imaging of the chest was performed using the standard protocol during bolus administration of intravenous contrast. Multiplanar CT image reconstructions and MIPs were obtained to evaluate the vascular anatomy. Multidetector CT imaging of the abdomen and pelvis was performed using the standard protocol during bolus administration of intravenous contrast. RADIATION DOSE REDUCTION: This exam was performed according to the departmental dose-optimization program which includes automated exposure control, adjustment of the mA and/or kV according to patient size and/or use of iterative reconstruction technique. CONTRAST:  75mL OMNIPAQUE  IOHEXOL  350 MG/ML SOLN COMPARISON:  CT abdomen and pelvis 09/15/2014. CT angiogram chest 08/28/2023. FINDINGS: CTA CHEST FINDINGS Cardiovascular: The ascending aorta is dilated measuring 4.1 cm. There are atherosclerotic calcifications of the aorta and coronary arteries. The heart is mildly enlarged. There is no pericardial effusion. There is adequate opacification of the pulmonary arteries to the segmental level. The main pulmonary artery is dilated, unchanged. No pulmonary embolism identified. Mediastinum/Nodes: No enlarged mediastinal, hilar, or axillary lymph nodes. Thyroid gland, trachea, and esophagus demonstrate no significant findings. Lungs/Pleura: Mild emphysema present. The lungs are  otherwise clear. No pleural effusion or pneumothorax. Musculoskeletal: No chest wall abnormality. No acute or significant osseous findings. Review of the MIP images confirms the above findings. CT ABDOMEN and PELVIS FINDINGS Hepatobiliary: There is fatty infiltration of the liver. The gallbladder surgically absent. There is no biliary ductal dilatation. Pancreas: Unremarkable. No pancreatic ductal dilatation or surrounding inflammatory changes. Spleen: Normal in size without focal abnormality. Adrenals/Urinary Tract: The bladder is grossly within normal limits, but evaluation is limited secondary to streak artifact in the pelvis. The bilateral kidneys adrenal glands appear normal. Stomach/Bowel: Stomach is within normal limits. No evidence of bowel wall thickening, distention, or inflammatory changes. There are scattered colonic diverticula. The appendix is not visualized. Vascular/Lymphatic: Aortic atherosclerosis. No enlarged abdominal or pelvic lymph nodes. Reproductive: Status post hysterectomy. No adnexal masses. Other: Of the level of the umbilicus there is a fat containing ventral hernia which is small in size. In the upper abdomen there is a small midline ventral hernia containing nondilated bowel loops. Appearance is similar to prior. There is no ascites. Musculoskeletal: Left hip arthroplasty is present. There severe degenerative changes of the right hip. Review of the MIP images confirms the above findings. IMPRESSION: 1. No evidence for pulmonary embolism. 2. Stable dilatation of the main pulmonary artery compatible with pulmonary arterial hypertension. 3. 4.1 cm ascending aortic aneurysm. Recommend annual imaging followup by CTA or MRA. This recommendation follows 2010 ACCF/AHA/AATS/ACR/ASA/SCA/SCAI/SIR/STS/SVM Guidelines for the Diagnosis and Management of Patients with Thoracic Aortic Disease. Circulation. 2010; 121: Z733-z630. Aortic aneurysm NOS (ICD10-I71.9) 4. No acute localizing process in the  abdomen or pelvis. 5. Colonic diverticulosis. 6. Fatty infiltration of the liver. 7. Stable ventral hernias. Aortic Atherosclerosis (ICD10-I70.0). Electronically Signed   By: Greig Pique M.D.   On: 11/13/2023 19:09     Procedures   Angiocath insertion Performed by: Angie Carr  Consent: Verbal consent obtained. Risks and benefits: risks, benefits and alternatives were discussed Time out: Immediately prior to procedure a time out was called to verify the correct patient, procedure, equipment, support staff and site/side marked as required.  Preparation: Patient was prepped and draped in the usual sterile fashion.  Vein Location: R antecube   Ultrasound Guided  Gauge: 20 long   Normal blood return and flush without difficulty Patient tolerance: Patient tolerated the procedure well with no immediate complications.  CRITICAL CARE Performed by: Angie Carr   Total critical care  time: 39 minutes  Critical care time was exclusive of separately billable procedures and treating other patients.  Critical care was necessary to treat or prevent imminent or life-threatening deterioration.  Critical care was time spent personally by me on the following activities: development of treatment plan with patient and/or surrogate as well as nursing, discussions with consultants, evaluation of patient's response to treatment, examination of patient, obtaining history from patient or surrogate, ordering and performing treatments and interventions, ordering and review of laboratory studies, ordering and review of radiographic studies, pulse oximetry and re-evaluation of patient's condition.   Medications Ordered in the ED  potassium chloride  10 mEq in 100 mL IVPB (10 mEq Intravenous New Bag/Given 11/13/23 1828)  magnesium  sulfate IVPB 2 g 50 mL (2 g Intravenous New Bag/Given 11/13/23 1919)  sodium chloride  0.9 % bolus 1,000 mL (0 mLs Intravenous Stopped 11/13/23 1914)  potassium chloride  SA  (KLOR-CON  M) CR tablet 40 mEq (40 mEq Oral Given 11/13/23 1828)  iohexol  (OMNIPAQUE ) 350 MG/ML injection 75 mL (75 mLs Intravenous Contrast Given 11/13/23 1900)                                    Medical Decision Making KAAVYA PUSKARICH is a 68 y.o. female who presented with dizziness and shortness of breath and rapid heart rate.  Consider rapid A-fib versus electrolyte abnormality such as hypokalemia and hypomagnesemia.  Patient has poor p.o. intake as of her potassium and magnesium  supplements.  Also consider PE or intra-abdominal process.  Plan to get CBC CMP and magnesium  and CTA chest and CT abdomen pelvis  7:25 PM Reviewed patient's labs and potassium is low at 2.4.  CTA chest showed stable aneurysm but no PE.  Patient's CT abdomen pelvis is unremarkable.  Ordered multiple rounds of potassium and magnesium .  Hospitalist to admit for hypokalemia and hypomagnesemia.   Problems Addressed: Hypokalemia: acute illness or injury Hypomagnesemia: acute illness or injury  Amount and/or Complexity of Data Reviewed Labs: ordered. Decision-making details documented in ED Course. Radiology: ordered and independent interpretation performed. Decision-making details documented in ED Course.  Risk Prescription drug management. Decision regarding hospitalization.    Final diagnoses:  None    ED Discharge Orders     None          Patt Angie Macho, MD 11/13/23 [phone]

## 2023-11-13 NOTE — ED Provider Triage Note (Signed)
 Emergency Medicine Provider Triage Evaluation Note  Angie Carr , a 68 y.o. female  was evaluated in triage.  Pt complains of dehydration, poor PO intake. Recently admitted for PE and is on eliquis . Has poor PO intake and went to GI who sent patient for evaluation. Per EMS, she has tachycardia intermittently up to 190s.   Review of Systems  Positive: Poor PO intake  Negative: Chest pain   Physical Exam  BP 108/77 (BP Location: Right Arm)   Pulse 91   Temp 98.2 F (36.8 C) (Oral)   Resp 16   Ht 5' 4 (1.626 m)   Wt 74.4 kg   SpO2 100%   BMI 28.15 kg/m  Gen:   Awake, no distress, dehydrated Resp:  Normal effort  MSK:   Moves extremities without difficulty  Other:    Medical Decision Making  Medically screening exam initiated at 5:02 PM.  Appropriate orders placed.  Angie Carr was informed that the remainder of the evaluation will be completed by another provider, this initial triage assessment does not replace that evaluation, and the importance of remaining in the ED until their evaluation is complete.  Angie Carr is a 68 y.o. female here with tachycardia, dehydration. Appears slightly dehydrated, ordered labs. Given intermittent tachycardia to 190s, will need a room for cardiac monitoring     Patt Alm Macho, MD 11/13/23 1704

## 2023-11-14 ENCOUNTER — Encounter (HOSPITAL_COMMUNITY): Payer: Self-pay | Admitting: Student

## 2023-11-14 DIAGNOSIS — R1310 Dysphagia, unspecified: Secondary | ICD-10-CM | POA: Diagnosis not present

## 2023-11-14 DIAGNOSIS — R42 Dizziness and giddiness: Secondary | ICD-10-CM | POA: Diagnosis present

## 2023-11-14 DIAGNOSIS — Z8249 Family history of ischemic heart disease and other diseases of the circulatory system: Secondary | ICD-10-CM | POA: Diagnosis not present

## 2023-11-14 DIAGNOSIS — Z86711 Personal history of pulmonary embolism: Secondary | ICD-10-CM

## 2023-11-14 DIAGNOSIS — K222 Esophageal obstruction: Secondary | ICD-10-CM | POA: Diagnosis not present

## 2023-11-14 DIAGNOSIS — E876 Hypokalemia: Secondary | ICD-10-CM | POA: Diagnosis present

## 2023-11-14 DIAGNOSIS — B3781 Candidal esophagitis: Secondary | ICD-10-CM | POA: Diagnosis not present

## 2023-11-14 DIAGNOSIS — I7121 Aneurysm of the ascending aorta, without rupture: Secondary | ICD-10-CM | POA: Diagnosis present

## 2023-11-14 DIAGNOSIS — Z7902 Long term (current) use of antithrombotics/antiplatelets: Secondary | ICD-10-CM | POA: Diagnosis not present

## 2023-11-14 DIAGNOSIS — Z1152 Encounter for screening for COVID-19: Secondary | ICD-10-CM | POA: Diagnosis not present

## 2023-11-14 DIAGNOSIS — Z7901 Long term (current) use of anticoagulants: Secondary | ICD-10-CM | POA: Diagnosis not present

## 2023-11-14 DIAGNOSIS — F17210 Nicotine dependence, cigarettes, uncomplicated: Secondary | ICD-10-CM | POA: Diagnosis present

## 2023-11-14 DIAGNOSIS — K219 Gastro-esophageal reflux disease without esophagitis: Secondary | ICD-10-CM | POA: Diagnosis not present

## 2023-11-14 DIAGNOSIS — K224 Dyskinesia of esophagus: Secondary | ICD-10-CM | POA: Diagnosis present

## 2023-11-14 DIAGNOSIS — R933 Abnormal findings on diagnostic imaging of other parts of digestive tract: Secondary | ICD-10-CM | POA: Diagnosis not present

## 2023-11-14 DIAGNOSIS — L89152 Pressure ulcer of sacral region, stage 2: Secondary | ICD-10-CM | POA: Diagnosis present

## 2023-11-14 DIAGNOSIS — R1314 Dysphagia, pharyngoesophageal phase: Secondary | ICD-10-CM | POA: Diagnosis present

## 2023-11-14 DIAGNOSIS — R1319 Other dysphagia: Secondary | ICD-10-CM | POA: Diagnosis not present

## 2023-11-14 DIAGNOSIS — K2100 Gastro-esophageal reflux disease with esophagitis, without bleeding: Secondary | ICD-10-CM | POA: Diagnosis present

## 2023-11-14 DIAGNOSIS — K449 Diaphragmatic hernia without obstruction or gangrene: Secondary | ICD-10-CM | POA: Diagnosis not present

## 2023-11-14 DIAGNOSIS — E1151 Type 2 diabetes mellitus with diabetic peripheral angiopathy without gangrene: Secondary | ICD-10-CM | POA: Diagnosis present

## 2023-11-14 DIAGNOSIS — R000 Tachycardia, unspecified: Secondary | ICD-10-CM | POA: Diagnosis present

## 2023-11-14 DIAGNOSIS — I714 Abdominal aortic aneurysm, without rupture, unspecified: Secondary | ICD-10-CM | POA: Diagnosis present

## 2023-11-14 DIAGNOSIS — E860 Dehydration: Secondary | ICD-10-CM | POA: Diagnosis present

## 2023-11-14 DIAGNOSIS — D649 Anemia, unspecified: Secondary | ICD-10-CM | POA: Diagnosis present

## 2023-11-14 DIAGNOSIS — E43 Unspecified severe protein-calorie malnutrition: Secondary | ICD-10-CM | POA: Diagnosis present

## 2023-11-14 DIAGNOSIS — I10 Essential (primary) hypertension: Secondary | ICD-10-CM | POA: Diagnosis present

## 2023-11-14 DIAGNOSIS — K5900 Constipation, unspecified: Secondary | ICD-10-CM | POA: Diagnosis present

## 2023-11-14 DIAGNOSIS — M170 Bilateral primary osteoarthritis of knee: Secondary | ICD-10-CM | POA: Diagnosis present

## 2023-11-14 DIAGNOSIS — R531 Weakness: Secondary | ICD-10-CM

## 2023-11-14 DIAGNOSIS — F419 Anxiety disorder, unspecified: Secondary | ICD-10-CM | POA: Diagnosis present

## 2023-11-14 LAB — COMPREHENSIVE METABOLIC PANEL WITH GFR
ALT: 10 U/L (ref 0–44)
AST: 13 U/L — ABNORMAL LOW (ref 15–41)
Albumin: 2.1 g/dL — ABNORMAL LOW (ref 3.5–5.0)
Alkaline Phosphatase: 50 U/L (ref 38–126)
Anion gap: 14 (ref 5–15)
BUN: 13 mg/dL (ref 8–23)
CO2: 14 mmol/L — ABNORMAL LOW (ref 22–32)
Calcium: 6.4 mg/dL — CL (ref 8.9–10.3)
Chloride: 114 mmol/L — ABNORMAL HIGH (ref 98–111)
Creatinine, Ser: 0.99 mg/dL (ref 0.44–1.00)
GFR, Estimated: >60 mL/min (ref >60)
Glucose, Bld: 70 mg/dL (ref 70–99)
Potassium: 4.2 mmol/L (ref 3.5–5.1)
Sodium: 142 mmol/L (ref 135–145)
Total Bilirubin: 1.4 mg/dL — ABNORMAL HIGH (ref 0.0–1.2)
Total Protein: 4.7 g/dL — ABNORMAL LOW (ref 6.5–8.1)

## 2023-11-14 LAB — CBC
HCT: 26.4 % — ABNORMAL LOW (ref 36.0–46.0)
Hemoglobin: 9.3 g/dL — ABNORMAL LOW (ref 12.0–15.0)
MCH: 32.5 pg (ref 26.0–34.0)
MCHC: 35.2 g/dL (ref 30.0–36.0)
MCV: 92.3 fL (ref 80.0–100.0)
Platelets: 133 K/uL — ABNORMAL LOW (ref 150–400)
RBC: 2.86 MIL/uL — ABNORMAL LOW (ref 3.87–5.11)
RDW: 17.1 % — ABNORMAL HIGH (ref 11.5–15.5)
WBC: 4.6 K/uL (ref 4.0–10.5)
nRBC: 0 % (ref 0.0–0.2)

## 2023-11-14 LAB — GLUCOSE, CAPILLARY
Glucose-Capillary: 63 mg/dL — ABNORMAL LOW (ref 70–99)
Glucose-Capillary: 93 mg/dL (ref 70–99)
Glucose-Capillary: 81 mg/dL (ref 70–99)
Glucose-Capillary: 103 mg/dL — ABNORMAL HIGH (ref 70–99)
Glucose-Capillary: 75 mg/dL (ref 70–99)

## 2023-11-14 LAB — MAGNESIUM: Magnesium: 1.2 mg/dL — ABNORMAL LOW (ref 1.7–2.4)

## 2023-11-14 LAB — PHOSPHORUS: Phosphorus: 1.3 mg/dL — ABNORMAL LOW (ref 2.5–4.6)

## 2023-11-14 MED ORDER — CALCIUM GLUCONATE-NACL 2-0.675 GM/100ML-% IV SOLN
2.0000 g | Freq: Once | INTRAVENOUS | Status: DC
  Filled 2023-11-14: qty 100

## 2023-11-14 MED ORDER — SENNOSIDES-DOCUSATE SODIUM 8.6-50 MG PO TABS
1.0000 | ORAL_TABLET | Freq: Two times a day (BID) | ORAL | Status: DC
  Administered 2023-11-14 – 2023-11-20 (×9): 1 via ORAL
  Filled 2023-11-14 (×13): qty 1

## 2023-11-14 MED ORDER — INSULIN ASPART 100 UNIT/ML IJ SOLN: 0.0000 [IU] | Freq: Three times a day (TID) | INTRAMUSCULAR | Status: DC

## 2023-11-14 MED ORDER — POTASSIUM PHOSPHATES 15 MMOLE/5ML IV SOLN
30.0000 mmol | Freq: Once | INTRAVENOUS | Status: AC
  Administered 2023-11-14: 30 mmol via INTRAVENOUS
  Filled 2023-11-14: qty 10

## 2023-11-14 MED ORDER — ORAL CARE MOUTH RINSE: 15.0000 mL | OROMUCOSAL | Status: DC | PRN

## 2023-11-14 MED ORDER — CALCIUM GLUCONATE-NACL 2-0.675 GM/100ML-% IV SOLN
2.0000 g | Freq: Once | INTRAVENOUS | Status: AC
  Administered 2023-11-14: 2000 mg via INTRAVENOUS
  Filled 2023-11-14: qty 100

## 2023-11-14 MED ORDER — POLYETHYLENE GLYCOL 3350 17 G PO PACK
17.0000 g | PACK | Freq: Every day | ORAL | Status: DC
  Administered 2023-11-14 – 2023-11-18 (×4): 17 g via ORAL
  Filled 2023-11-14 (×7): qty 1

## 2023-11-14 MED ORDER — SODIUM CHLORIDE 0.9 % IV SOLN: INTRAVENOUS | Status: AC

## 2023-11-14 MED ORDER — MIRTAZAPINE 15 MG PO TABS
7.5000 mg | ORAL_TABLET | Freq: Every day | ORAL | Status: DC
  Administered 2023-11-14 – 2023-11-21 (×8): 7.5 mg via ORAL
  Filled 2023-11-14 (×8): qty 1

## 2023-11-14 MED ORDER — POTASSIUM CHLORIDE CRYS ER 20 MEQ PO TBCR
20.0000 meq | EXTENDED_RELEASE_TABLET | Freq: Every day | ORAL | Status: DC
  Administered 2023-11-14 – 2023-11-16 (×3): 20 meq via ORAL
  Filled 2023-11-14 (×3): qty 1

## 2023-11-14 MED ORDER — APIXABAN 5 MG PO TABS
5.0000 mg | ORAL_TABLET | Freq: Two times a day (BID) | ORAL | Status: DC
Start: 2023-11-14 — End: 2023-11-18
  Administered 2023-11-14 – 2023-11-18 (×9): 5 mg via ORAL
  Filled 2023-11-14 (×9): qty 1

## 2023-11-14 MED ORDER — MAGNESIUM SULFATE 2 GM/50ML IV SOLN
2.0000 g | Freq: Once | INTRAVENOUS | Status: AC
  Administered 2023-11-14: 2 g via INTRAVENOUS
  Filled 2023-11-14: qty 50

## 2023-11-14 MED ORDER — MECLIZINE HCL 25 MG PO TABS
25.0000 mg | ORAL_TABLET | Freq: Three times a day (TID) | ORAL | Status: AC | PRN
Start: 2023-11-14 — End: ?

## 2023-11-14 MED ORDER — CILOSTAZOL 50 MG PO TABS
50.0000 mg | ORAL_TABLET | Freq: Two times a day (BID) | ORAL | Status: DC
  Administered 2023-11-14 – 2023-11-21 (×16): 50 mg via ORAL
  Filled 2023-11-14 (×17): qty 1

## 2023-11-14 MED ORDER — PANTOPRAZOLE SODIUM 40 MG PO TBEC
40.0000 mg | DELAYED_RELEASE_TABLET | Freq: Every day | ORAL | Status: DC
  Administered 2023-11-14 – 2023-11-20 (×7): 40 mg via ORAL
  Filled 2023-11-14 (×7): qty 1

## 2023-11-14 NOTE — Progress Notes (Signed)
 New Admission Note:   Arrival Method: Stretcher Mental Orientation: alert x 4 Telemetry: box 14 Assessment: Completed Skin: see flowsheet IV: NSL Pain: none Tubes: none Safety Measures: Safety Fall Prevention Plan has been discussed Admission: Completed [facility] Orientation: Patient has been orientated to the room, unit and staff.  Family: none at bedside  Orders have been reviewed and implemented. Will continue to monitor the patient. Call light has been placed within reach and bed alarm has been activated.   Channing Na BSN, RN Phone number: [phone]

## 2023-11-14 NOTE — Progress Notes (Cosign Needed Addendum)
 RE: Angie Carr Date of Birth:  10-07-1955 Date: 11/14/2023   To Whom It May Concern:  Please be advised that the above-named patient will require a short-term nursing home stay - anticipated 30 days or less for rehabilitation and strengthening.  The plan is for return home.                 MD signature

## 2023-11-14 NOTE — Progress Notes (Signed)
 PO4 came back at 1.3 this AM. Ok to replace with x1 per Dr. Jillian. Will avoid giving Ca since pt got 2g this AM.  Sergio Batch, PharmD, BCIDP, AAHIVP, CPP Infectious Disease Pharmacist 11/14/2023 7:49 AM

## 2023-11-14 NOTE — Discharge Instructions (Addendum)
 Information on my medicine - ELIQUIS  (apixaban )  This medication education was reviewed with me or my healthcare representative as part of my discharge preparation.    Why was Eliquis  prescribed for you? Eliquis  was prescribed to treat blood clots that may have been found in the veins of your legs (deep vein thrombosis) or in your lungs (pulmonary embolism) and to reduce the risk of them occurring again.  What do You need to know about Eliquis  ? Continue Eliquis  5 mg tablet taken TWICE daily.  Eliquis  may be taken with or without food.   Try to take the dose about the same time in the morning and in the evening. If you have difficulty swallowing the tablet whole please discuss with your pharmacist how to take the medication safely.  Take Eliquis  exactly as prescribed and DO NOT stop taking Eliquis  without talking to the doctor who prescribed the medication.  Stopping may increase your risk of developing a new blood clot.  Refill your prescription before you run out.  After discharge, you should have regular check-up appointments with your healthcare provider that is prescribing your Eliquis .    What do you do if you miss a dose? If a dose of ELIQUIS  is not taken at the scheduled time, take it as soon as possible on the same day and twice-daily administration should be resumed. The dose should not be doubled to make up for a missed dose.  Important Safety Information A possible side effect of Eliquis  is bleeding. You should call your healthcare provider right away if you experience any of the following: Bleeding from an injury or your nose that does not stop. Unusual colored urine (red or dark brown) or unusual colored stools (red or black). Unusual bruising for unknown reasons. A serious fall or if you hit your head (even if there is no bleeding).  Some medicines may interact with Eliquis  and might increase your risk of bleeding or clotting while on Eliquis . To help avoid this,  consult your healthcare provider or pharmacist prior to using any new prescription or non-prescription medications, including herbals, vitamins, non-steroidal anti-inflammatory drugs (NSAIDs) and supplements.  This website has more information on Eliquis  (apixaban ): [email or web address]   Follow with Benjamine Aland, MD in 5-7 days  Please get a complete blood count and chemistry panel checked by your Primary MD at your next visit, and again as instructed by your Primary MD. Please get your medications reviewed and adjusted by your Primary MD.  Please request your Primary MD to go over all Hospital Tests and Procedure/Radiological results at the follow up, please get all Hospital records sent to your Prim MD by signing hospital release before you go home.  In some cases, there will be blood work, cultures and biopsy results pending at the time of your discharge. Please request that your primary care M.D. goes through all the records of your hospital data and follows up on these results.  If you had Pneumonia of Lung problems at the Hospital: Please get a 2 view Chest X ray done in 6-8 weeks after hospital discharge or sooner if instructed by your Primary MD.  If you have Congestive Heart Failure: Please call your Cardiologist or Primary MD anytime you have any of the following symptoms:  1) 3 pound weight gain in 24 hours or 5 pounds in 1 week  2) shortness of breath, with or without a dry hacking cough  3) swelling in the hands, feet or stomach  4) if you have  to sleep on extra pillows at night in order to breathe  Follow cardiac low salt diet and 1.5 lit/day fluid restriction.  If you have diabetes Accuchecks 4 times/day, Once in AM empty stomach and then before each meal. Log in all results and show them to your primary doctor at your next visit. If any glucose reading is under 80 or above 300 call your primary MD immediately.  If you have  Seizure/Convulsions/Epilepsy: Please do not drive, operate heavy machinery, participate in activities at heights or participate in high speed sports until you have seen by Primary MD or a Neurologist and advised to do so again. Per [place] statutes, patients with seizures are not allowed to drive until they have been seizure-free for six months.  Use caution when using heavy equipment or power tools. Avoid working on ladders or at heights. Take showers instead of baths. Ensure the water temperature is not too high on the home water heater. Do not go swimming alone. Do not lock yourself in a room alone (i.e. bathroom). When caring for infants or small children, sit down when holding, feeding, or changing them to minimize risk of injury to the child in the event you have a seizure. Maintain good sleep hygiene. Avoid alcohol .   If you had Gastrointestinal Bleeding: Please ask your Primary MD to check a complete blood count within one week of discharge or at your next visit. Your endoscopic/colonoscopic biopsies that are pending at the time of discharge, will also need to followed by your Primary MD.  Get Medicines reviewed and adjusted. Please take all your medications with you for your next visit with your Primary MD  Please request your Primary MD to go over all hospital tests and procedure/radiological results at the follow up, please ask your Primary MD to get all Hospital records sent to his/her office.  If you experience worsening of your admission symptoms, develop shortness of breath, life threatening emergency, suicidal or homicidal thoughts you must seek medical attention immediately by calling 911 or calling your MD immediately  if symptoms less severe.  You must read complete instructions/literature along with all the possible adverse reactions/side effects for all the Medicines you take and that have been prescribed to you. Take any new Medicines after you have completely  understood and accpet all the possible adverse reactions/side effects.   Do not drive or operate heavy machinery when taking Pain medications.   Do not take more than prescribed Pain, Sleep and Anxiety Medications  Special Instructions: If you have smoked or chewed Tobacco  in the last 2 yrs please stop smoking, stop any regular Alcohol   and or any Recreational drug use.  Wear Seat belts while driving.  Please note You were cared for by a hospitalist during your hospital stay. If you have any questions about your discharge medications or the care you received while you were in the hospital after you are discharged, you can call the unit and asked to speak with the hospitalist on call if the hospitalist that took care of you is not available. Once you are discharged, your primary care physician will handle any further medical issues. Please note that NO REFILLS for any discharge medications will be authorized once you are discharged, as it is imperative that you return to your primary care physician (or establish a relationship with a primary care physician if you do not have one) for your aftercare needs so that they can reassess your need for medications and monitor  your lab values.  You can reach the hospitalist office at phone [phone] or fax [phone]   If you do not have a primary care physician, you can call [phone] for a physician referral.  Activity: As tolerated with Full fall precautions use walker/cane & assistance as needed    Diet: regular  Disposition Home

## 2023-11-14 NOTE — NC FL2 (Signed)
 [place]  MEDICAID FL2 LEVEL OF CARE FORM     IDENTIFICATION  Patient Name: Angie Carr Birthdate: August 22, 1955 Sex: female Admission Date (Current Location): 11/13/2023  [facility] and [place] Number:  Producer, Television/film/video and Address:  [facility], [address], [place], [place] [address]      Provider Number: [id number]  Attending Physician Name and Address:  Jillian Buttery, MD  Relative Name and Phone Number:  Fleet Shuck (Sister)  [phone]    Current Level of Care: Hospital Recommended Level of Care: Skilled Nursing Facility Prior Approval Number:    Date Approved/Denied:   PASRR Number: pending  Discharge Plan: SNF    Current Diagnoses: Patient Active Problem List   Diagnosis Date Noted   Abdominal aortic aneurysm (AAA) >39 mm diameter 11/14/2023   Gastroesophageal reflux disease 11/14/2023   Generalized weakness 11/14/2023   Dizziness 11/14/2023   History of pulmonary embolism 11/14/2023   Hypokalemia 11/13/2023   Dysgeusia 09/18/2023   Nausea and vomiting 09/18/2023   Dehydration 09/15/2023   COVID-19 09/15/2023   Hypomagnesemia 09/15/2023   Protein calorie malnutrition 09/15/2023   Uncontrolled type 2 diabetes mellitus with hypoglycemia, without long-term current use of insulin  (HCC) 09/15/2023   Peripheral arterial disease 08/29/2023   Diarrhea 08/29/2023   Pulmonary emboli (HCC) 08/29/2023   Pulmonary embolism (HCC) 08/28/2023   Febrile illness 12/11/2014   Severe sepsis (HCC) 12/11/2014   Hypotension 12/11/2014   Acute kidney injury 12/11/2014   Trichomonal vaginitis    Absolute anemia    Tobacco abuse 11/04/2012   Diabetes mellitus, type 2 (HCC) 03/18/2012   Morbid obesity (HCC) 03/18/2012   Hypertension associated with diabetes (HCC) 03/18/2012   Hyperlipidemia LDL goal <70 03/18/2012   Arthritis 03/18/2012    Orientation RESPIRATION BLADDER Height & Weight     Self, Time, Situation, Place  Normal  Incontinent Weight: 171 lb 8.3 oz (77.8 kg) Height:  5' 4 (162.6 cm)  BEHAVIORAL SYMPTOMS/MOOD NEUROLOGICAL BOWEL NUTRITION STATUS      Continent Diet (see dc summary)  AMBULATORY STATUS COMMUNICATION OF NEEDS Skin   Limited Assist Verbally Skin abrasions [place])                       Personal Care Assistance Level of Assistance  Bathing, Feeding, Dressing Bathing Assistance: Maximum assistance Feeding assistance: Independent Dressing Assistance: Maximum assistance     Functional Limitations Info  Sight, Hearing, Speech Sight Info: Adequate Hearing Info: Adequate Speech Info: Adequate    SPECIAL CARE FACTORS FREQUENCY  PT (By licensed PT), OT (By licensed OT)     PT Frequency: 5x/wk OT Frequency: 5x/wk            Contractures Contractures Info: Not present    Additional Factors Info  Code Status, Allergies Code Status Info: FULL Allergies Info: NKA           Current Medications (11/14/2023):  This is the current hospital active medication list Current Facility-Administered Medications  Medication Dose Route Frequency Provider Last Rate Last Admin   0.9 %  sodium chloride  infusion   Intravenous Continuous Amponsah, Prosper M, MD 100 mL/hr at 11/14/23 1410 New Bag at 11/14/23 1410   acetaminophen  (TYLENOL ) tablet 650 mg  650 mg Oral Q6H PRN Amponsah, Prosper M, MD       Or   acetaminophen  (TYLENOL ) suppository 650 mg  650 mg Rectal Q6H PRN Lou Claretta CHRISTELLA, MD       apixaban  (ELIQUIS ) tablet  5 mg  5 mg Oral BID Amponsah, Prosper M, MD   5 mg at 11/14/23 0847   bisacodyl (DULCOLAX) EC tablet 5 mg  5 mg Oral Daily PRN Amponsah, Prosper M, MD       cilostazol  (PLETAL ) tablet 50 mg  50 mg Oral BID Amponsah, Prosper M, MD   50 mg at 11/14/23 0848   insulin  aspart (novoLOG ) injection 0-9 Units  0-9 Units Subcutaneous TID WC Amponsah, Prosper M, MD       meclizine (ANTIVERT) tablet 25 mg  25 mg Oral TID PRN Jillian Buttery, MD       mirtazapine  (REMERON )  tablet 7.5 mg  7.5 mg Oral Daily Lou Claretta HERO, MD   7.5 mg at 11/14/23 9085   ondansetron  (ZOFRAN ) tablet 4 mg  4 mg Oral Q6H PRN Amponsah, Prosper M, MD       Or   ondansetron  (ZOFRAN ) injection 4 mg  4 mg Intravenous Q6H PRN Amponsah, Prosper M, MD   4 mg at 11/13/23 2057   Oral care mouth rinse  15 mL Mouth Rinse PRN Jillian Buttery, MD       pantoprazole  (PROTONIX ) EC tablet 40 mg  40 mg Oral Daily Amponsah, Prosper M, MD   40 mg at 11/14/23 0847   polyethylene glycol (MIRALAX  / GLYCOLAX ) packet 17 g  17 g Oral Daily Jillian Buttery, MD   17 g at 11/14/23 1411   potassium chloride  SA (KLOR-CON  M) CR tablet 20 mEq  20 mEq Oral Daily Amponsah, Prosper M, MD   20 mEq at 11/14/23 0846   potassium PHOSPHATE  30 mmol in dextrose  5 % 500 mL infusion  30 mmol Intravenous Once Pham, Minh Q, RPH-CPP 85 mL/hr at 11/14/23 0921 30 mmol at 11/14/23 9078   senna-docusate (Senokot-S) tablet 1 tablet  1 tablet Oral BID Jillian Buttery, MD   1 tablet at 11/14/23 1411     Discharge Medications: Please see discharge summary for a list of discharge medications.  Relevant Imaging Results:  Relevant Lab Results:   Additional Information [id number]  [place], LCSWA

## 2023-11-14 NOTE — Evaluation (Signed)
 Occupational Therapy Evaluation Patient Details Name: JAHLIYAH TRICE MRN: [id number] DOB: 1955-08-11 Today's Date: 11/14/2023   History of Present Illness   Pt is a 68 y.o. female who presented 11/10/23 due to dizziness and weakness. Pt was admitted due to hypokalemia. PMHx: DM2 , HTN, peripheral vascular disease, PE , anxiety asthma,  hypertension, neuromuscular disorder, COVID, DJD     Clinical Impressions Pt reported at PLOF they were ambulating only around the home with 4WW but very limited. She reported living in a level entrance home (per chart ramp entrance) with their son and grandson but they can not physically assist at this time (per chart son WC level). At this time pt only agreed to come to EOB with mod-max assist and completed self feeding with min assist to CGA while sitting at EOB. She required then max assist to go sitting to supine and total x2 for reposition in bed and was left in chair position. Patient will benefit from continued inpatient follow up therapy, <3 hours/day.    BP: Supine: 110/64 (79) 69 Sitting: 147/89 (108) 112    If plan is discharge home, recommend the following:   A lot of help with walking and/or transfers;A lot of help with bathing/dressing/bathroom;Assistance with cooking/housework;Assist for transportation     Functional Status Assessment   Patient has had a recent decline in their functional status and demonstrates the ability to make significant improvements in function in a reasonable and predictable amount of time.     Equipment Recommendations   BSC/3in1 (TBD)     Recommendations for Other Services         Precautions/Restrictions   Precautions Precautions: Fall Recall of Precautions/Restrictions: Impaired Restrictions Weight Bearing Restrictions Per Provider Order: No     Mobility Bed Mobility Overal bed mobility: Needs Assistance Bed Mobility: Supine to Sit, Sit to Supine     Supine to sit: Mod assist,  Max assist Sit to supine: Max assist   General bed mobility comments: total x2 to reposition in bed    Transfers Overall transfer level: Needs assistance Equipment used: Rolling walker (2 wheels) Transfers: Sit to/from Stand Sit to Stand: Max assist, Via lift equipment           General transfer comment: with nursing had bed very elevated and then side step with min x2 to side step, HHA x2      Balance Overall balance assessment: Needs assistance Sitting-balance support: Feet supported Sitting balance-Leahy Scale: Fair Sitting balance - Comments: needed BUE support   Standing balance support: Bilateral upper extremity supported Standing balance-Leahy Scale: Poor Standing balance comment: reliant on BUE support                           ADL either performed or assessed with clinical judgement   ADL Overall ADL's : Needs assistance/impaired Eating/Feeding: Set up;Sitting Eating/Feeding Details (indicate cue type and reason): intially pt having therapist hold cup but then encouraged to kep drinking soda per nursing due to low sugar levels Grooming: Wash/dry hands;Contact guard assist;Sitting   Upper Body Bathing: Contact guard assist;Minimal assistance;Sitting   Lower Body Bathing: Maximal assistance;Total assistance;Sit to/from stand   Upper Body Dressing : Contact guard assist;Minimal assistance;Sitting   Lower Body Dressing: Maximal assistance;Total assistance;Sit to/from stand   Toilet Transfer: Maximal assistance;Total assistance   Toileting- Clothing Manipulation and Hygiene: Maximal assistance;Total assistance;Bed level               Vision  Perception         Praxis         Pertinent Vitals/Pain Pain Assessment Pain Assessment: Faces Faces Pain Scale: Hurts little more Pain Location: BLE Pain Descriptors / Indicators: Grimacing, Guarding Pain Intervention(s): Limited activity within patient's tolerance, Monitored during  session, Repositioned     Extremity/Trunk Assessment Upper Extremity Assessment Upper Extremity Assessment: Generalized weakness (did not fully assess due to fatigue)   Lower Extremity Assessment Lower Extremity Assessment: Defer to PT evaluation       Communication     Cognition Arousal: Alert Behavior During Therapy: WFL for tasks assessed/performed               OT - Cognition Comments: did not note any cognition impairments at this time but did not want to speak to therapist much in session as reporting very tired                 Following commands: Intact       Cueing  General Comments   Cueing Techniques: Verbal cues      Exercises     Shoulder Instructions      Home Living Family/patient expects to be discharged to:: Private residence Living Arrangements: Children;Other relatives Available Help at Discharge: Family;Available PRN/intermittently;Available 24 hours/day Type of Home: House Home Access: Ramped entrance     Home Layout: One level     Bathroom Shower/Tub: Producer, Television/film/video: Standard     Home Equipment: Cane - single point;Rollator (4 wheels);Toilet riser   Additional Comments: son is indep at [facility] level, he cannot provide physical assist      Prior Functioning/Environment Prior Level of Function : Independent/Modified Independent             Mobility Comments: Ambulates with using rollator and mainly remains in the home ADLs Comments: last time in chart SCAT for transportation or family/friends.    OT Problem List: Decreased strength;Decreased activity tolerance;Impaired balance (sitting and/or standing);Decreased safety awareness;Decreased knowledge of use of DME or AE;Pain   OT Treatment/Interventions: Self-care/ADL training;Therapeutic exercise;DME and/or AE instruction;Therapeutic activities;Patient/family education;Balance training      OT Goals(Current goals can be found in the care plan section)    Acute Rehab OT Goals Patient Stated Goal: to get better OT Goal Formulation: With patient Time For Goal Achievement: 11/28/23 Potential to Achieve Goals: Fair   OT Frequency:  Min 2X/week    Co-evaluation              AM-PAC OT 6 Clicks Daily Activity     Outcome Measure Help from another person eating meals?: A Little Help from another person taking care of personal grooming?: A Little Help from another person toileting, which includes using toliet, bedpan, or urinal?: Total Help from another person bathing (including washing, rinsing, drying)?: Total Help from another person to put on and taking off regular upper body clothing?: A Lot Help from another person to put on and taking off regular lower body clothing?: Total 6 Click Score: 11   End of Session Equipment Utilized During Treatment: Gait belt  Activity Tolerance: No increased pain;Patient limited by fatigue Patient left:    OT Visit Diagnosis: Unsteadiness on feet (R26.81);Other abnormalities of gait and mobility (R26.89);Repeated falls (R29.6);Muscle weakness (generalized) (M62.81);Pain Pain - Right/Left:  (bilateral) Pain - part of body: Leg                Time: 9259-9179 OT Time Calculation (min): 40 min Charges:  OT General Charges $OT Visit: 1 Visit OT Evaluation $OT Eval Low Complexity: 1 Low OT Treatments $Self Care/Home Management : 23-37 mins  Warrick POUR OTR/L  Acute Rehab Services  [phone] office number   [name] 11/14/2023, 8:36 AM

## 2023-11-14 NOTE — Progress Notes (Signed)
 PROGRESS NOTE  Angie Carr  FMW:[id number] DOB: [birth year] DOA: 11/13/2023 PCP: Benjamine Aland, MD   Brief Narrative: Patient is a 68 year old female with history of hypertension, diabetes type 2, PE on Eliquis , peripheral artery disease, GERD, arthritis, anemia who presented with complaints of dizziness, weakness.  She was recently hospitalized in September when she was found to have hypokalemia/hypomagnesemia.  She was taking supplementation at home but recently discontinued by her PCP.  On presentation, she was hemodynamically stable.  Lab work showed potassium of 2.4, magnesium  of 0.5.  EKG showed normal sinus rhythm with occasional PVCs/PACs.  CTA chest study negative for PE but showed 4.1 cm ascending aortic aneurysm.  Patient admitted for the management of severe deficiency of electrolytes.  Currently being monitored and supplemented as needed.  PT recommended SNF on discharge.  Assessment & Plan:  Principal Problem:   Hypokalemia Active Problems:   Abdominal aortic aneurysm (AAA) >39 mm diameter   Gastroesophageal reflux disease   Generalized weakness   Dizziness   History of pulmonary embolism   Hypokalemia/hypomagnesemia: Presented with weakness, dizziness.  Severely deficient in electrolytes.  Continue monitoring and supplementation.  Denies any nausea, vomiting or diarrhea currently.  Abdomen benign on examination  Vertigo: Complains of positional vertigo with room spinning around her.  Noticed to have nystagmus as well.  Likely BPPV. status post Epley's maneuver .PT recommending SNF on discharge.  Started on meclizine.  No focal deficits .so MRI deferred  Type 2 diabetes: Well-controlled.  Recent A1c of 4.8.  Recently taken off from metformin  by her PCP.  Continue sliding scale  Generalized weakness/dizziness/deconditioning: PT requested.  Orthostatic vitals negative.  Recommended SNF on discharge.  TOC consulted.  With family.  Ambulates with a walker  History of PE: On  Eliquis   Ascending aortic aneurysm: CTA chest showed 4.1 cm ascending aortic aneurysm.  Needs annual follow-up imaging by CTA or MRA.  She needs to follow-up with  vascular surgery as an outpatient on discharge.  History of peripheral artery disease: Continue cilostazol   GERD: Continue PPI  Anxiety: On mirtazipine        DVT prophylaxis: apixaban  (ELIQUIS ) tablet 5 mg     Code Status: Full Code  Family Communication: None at bedside  Patient status:Inpatient  Patient is from :Home  Anticipated discharge to:SNF  Estimated DC date:whenever possible   Consultants: None  Procedures:None  Antimicrobials:  Anti-infectives (From admission, onward)    None       Subjective: Patient seen and examined at bedside today.  She was lying in bed.  Not in any kind of distress.  PT was evaluating her.  She denies any diarrhea or vomiting during my evaluation.  No abdominal pain.  Complains of vertigo when changing position of the head.  Objective: Vitals:   11/13/23 2104 11/14/23 0000 11/14/23 0113 11/14/23 0515  BP:  119/64 126/77 123/74  Pulse:  80 90 92  Resp:  10 16 18   Temp: 98.4 F (36.9 C)  98.8 F (37.1 C) 98.1 F (36.7 C)  TempSrc: Oral     SpO2:  100% 100% 100%  Weight:   77.8 kg   Height:       No intake or output data in the 24 hours ending 11/14/23 0814 Filed Weights   11/13/23 1627 11/14/23 0113  Weight: 74.4 kg 77.8 kg    Examination:  General exam: Overall comfortable, not in distress HEENT: PERRL Respiratory system:  no wheezes or crackles  Cardiovascular system: S1 & S2  heard, RRR.  Gastrointestinal system: Abdomen is nondistended, soft and nontender. Central nervous system: Alert and oriented Extremities: No edema, no clubbing ,no cyanosis Skin: No rashes, no ulcers,no icterus     Data Reviewed: I have personally reviewed following labs and imaging studies  CBC: Recent Labs  Lab 11/13/23 1626 11/13/23 1807 11/14/23 0407  WBC 5.1   --  4.6  HGB 11.5* 11.9* 9.3*  HCT 34.0* 35.0* 26.4*  MCV 95.5  --  92.3  PLT 164  --  133*   Basic Metabolic Panel: Recent Labs  Lab 11/13/23 1626 11/13/23 1749 11/13/23 1807 11/14/23 0407  NA 144  --  144 142  K 3.3*  --  2.4* 4.2  CL 107  --  108 114*  CO2 15*  --   --  14*  GLUCOSE 81  --  80 70  BUN 15  --  14 13  CREATININE 1.26*  --  0.80 0.99  CALCIUM  6.9*  --   --  6.4*  MG  --  0.5*  --  1.2*  PHOS  --   --   --  1.3*     Recent Results (from the past 240 hours)  Resp panel by RT-PCR (RSV, Flu A&B, Covid) Anterior Nasal Swab     Status: None   Collection Time: 11/13/23  6:05 PM   Specimen: Anterior Nasal Swab  Result Value Ref Range Status   SARS Coronavirus 2 by RT PCR NEGATIVE NEGATIVE Final   Influenza A by PCR NEGATIVE NEGATIVE Final   Influenza B by PCR NEGATIVE NEGATIVE Final    Comment: (NOTE) The Xpert Xpress SARS-CoV-2/FLU/RSV plus assay is intended as an aid in the diagnosis of influenza from Nasopharyngeal swab specimens and should not be used as a sole basis for treatment. Nasal washings and aspirates are unacceptable for Xpert Xpress SARS-CoV-2/FLU/RSV testing.  Fact Sheet for Patients: bloggercourse.com  Fact Sheet for Healthcare Providers: seriousbroker.it  This test is not yet approved or cleared by the [place]  FDA and has been authorized for detection and/or diagnosis of SARS-CoV-2 by FDA under an Emergency Use Authorization (EUA). This EUA will remain in effect (meaning this test can be used) for the duration of the COVID-19 declaration under Section 564(b)(1) of the Act, 21 U.S.C. section 360bbb-3(b)(1), unless the authorization is terminated or revoked.     Resp Syncytial Virus by PCR NEGATIVE NEGATIVE Final    Comment: (NOTE) Fact Sheet for Patients: bloggercourse.com  Fact Sheet for Healthcare  Providers: seriousbroker.it  This test is not yet approved or cleared by the [place]  FDA and has been authorized for detection and/or diagnosis of SARS-CoV-2 by FDA under an Emergency Use Authorization (EUA). This EUA will remain in effect (meaning this test can be used) for the duration of the COVID-19 declaration under Section 564(b)(1) of the Act, 21 U.S.C. section 360bbb-3(b)(1), unless the authorization is terminated or revoked.  Performed at [facility], [address], [place], [place] [address]      Radiology Studies: CT Angio Chest PE W and/or Wo Contrast Result Date: 11/13/2023 CLINICAL DATA:  High probability for PE.  Acute abdominal pain. EXAM: CT ANGIOGRAPHY CHEST CT ABDOMEN AND PELVIS WITH CONTRAST TECHNIQUE: Multidetector CT imaging of the chest was performed using the standard protocol during bolus administration of intravenous contrast. Multiplanar CT image reconstructions and MIPs were obtained to evaluate the vascular anatomy. Multidetector CT imaging of the abdomen and pelvis was performed using the standard protocol during  bolus administration of intravenous contrast. RADIATION DOSE REDUCTION: This exam was performed according to the departmental dose-optimization program which includes automated exposure control, adjustment of the mA and/or kV according to patient size and/or use of iterative reconstruction technique. CONTRAST:  75mL OMNIPAQUE  IOHEXOL  350 MG/ML SOLN COMPARISON:  CT abdomen and pelvis 09/15/2014. CT angiogram chest 08/28/2023. FINDINGS: CTA CHEST FINDINGS Cardiovascular: The ascending aorta is dilated measuring 4.1 cm. There are atherosclerotic calcifications of the aorta and coronary arteries. The heart is mildly enlarged. There is no pericardial effusion. There is adequate opacification of the pulmonary arteries to the segmental level. The main pulmonary artery is dilated, unchanged. No pulmonary embolism identified.  Mediastinum/Nodes: No enlarged mediastinal, hilar, or axillary lymph nodes. Thyroid gland, trachea, and esophagus demonstrate no significant findings. Lungs/Pleura: Mild emphysema present. The lungs are otherwise clear. No pleural effusion or pneumothorax. Musculoskeletal: No chest wall abnormality. No acute or significant osseous findings. Review of the MIP images confirms the above findings. CT ABDOMEN and PELVIS FINDINGS Hepatobiliary: There is fatty infiltration of the liver. The gallbladder surgically absent. There is no biliary ductal dilatation. Pancreas: Unremarkable. No pancreatic ductal dilatation or surrounding inflammatory changes. Spleen: Normal in size without focal abnormality. Adrenals/Urinary Tract: The bladder is grossly within normal limits, but evaluation is limited secondary to streak artifact in the pelvis. The bilateral kidneys adrenal glands appear normal. Stomach/Bowel: Stomach is within normal limits. No evidence of bowel wall thickening, distention, or inflammatory changes. There are scattered colonic diverticula. The appendix is not visualized. Vascular/Lymphatic: Aortic atherosclerosis. No enlarged abdominal or pelvic lymph nodes. Reproductive: Status post hysterectomy. No adnexal masses. Other: Of the level of the umbilicus there is a fat containing ventral hernia which is small in size. In the upper abdomen there is a small midline ventral hernia containing nondilated bowel loops. Appearance is similar to prior. There is no ascites. Musculoskeletal: Left hip arthroplasty is present. There severe degenerative changes of the right hip. Review of the MIP images confirms the above findings. IMPRESSION: 1. No evidence for pulmonary embolism. 2. Stable dilatation of the main pulmonary artery compatible with pulmonary arterial hypertension. 3. 4.1 cm ascending aortic aneurysm. Recommend annual imaging followup by CTA or MRA. This recommendation follows 2010  ACCF/AHA/AATS/ACR/ASA/SCA/SCAI/SIR/STS/SVM Guidelines for the Diagnosis and Management of Patients with Thoracic Aortic Disease. Circulation. 2010; 121: Z733-z630. Aortic aneurysm NOS (ICD10-I71.9) 4. No acute localizing process in the abdomen or pelvis. 5. Colonic diverticulosis. 6. Fatty infiltration of the liver. 7. Stable ventral hernias. Aortic Atherosclerosis (ICD10-I70.0). Electronically Signed   By: Greig Pique M.D.   On: 11/13/2023 19:09   CT ABDOMEN PELVIS W CONTRAST Result Date: 11/13/2023 CLINICAL DATA:  High probability for PE.  Acute abdominal pain. EXAM: CT ANGIOGRAPHY CHEST CT ABDOMEN AND PELVIS WITH CONTRAST TECHNIQUE: Multidetector CT imaging of the chest was performed using the standard protocol during bolus administration of intravenous contrast. Multiplanar CT image reconstructions and MIPs were obtained to evaluate the vascular anatomy. Multidetector CT imaging of the abdomen and pelvis was performed using the standard protocol during bolus administration of intravenous contrast. RADIATION DOSE REDUCTION: This exam was performed according to the departmental dose-optimization program which includes automated exposure control, adjustment of the mA and/or kV according to patient size and/or use of iterative reconstruction technique. CONTRAST:  75mL OMNIPAQUE  IOHEXOL  350 MG/ML SOLN COMPARISON:  CT abdomen and pelvis 09/15/2014. CT angiogram chest 08/28/2023. FINDINGS: CTA CHEST FINDINGS Cardiovascular: The ascending aorta is dilated measuring 4.1 cm. There are atherosclerotic calcifications of the aorta and  coronary arteries. The heart is mildly enlarged. There is no pericardial effusion. There is adequate opacification of the pulmonary arteries to the segmental level. The main pulmonary artery is dilated, unchanged. No pulmonary embolism identified. Mediastinum/Nodes: No enlarged mediastinal, hilar, or axillary lymph nodes. Thyroid gland, trachea, and esophagus demonstrate no significant  findings. Lungs/Pleura: Mild emphysema present. The lungs are otherwise clear. No pleural effusion or pneumothorax. Musculoskeletal: No chest wall abnormality. No acute or significant osseous findings. Review of the MIP images confirms the above findings. CT ABDOMEN and PELVIS FINDINGS Hepatobiliary: There is fatty infiltration of the liver. The gallbladder surgically absent. There is no biliary ductal dilatation. Pancreas: Unremarkable. No pancreatic ductal dilatation or surrounding inflammatory changes. Spleen: Normal in size without focal abnormality. Adrenals/Urinary Tract: The bladder is grossly within normal limits, but evaluation is limited secondary to streak artifact in the pelvis. The bilateral kidneys adrenal glands appear normal. Stomach/Bowel: Stomach is within normal limits. No evidence of bowel wall thickening, distention, or inflammatory changes. There are scattered colonic diverticula. The appendix is not visualized. Vascular/Lymphatic: Aortic atherosclerosis. No enlarged abdominal or pelvic lymph nodes. Reproductive: Status post hysterectomy. No adnexal masses. Other: Of the level of the umbilicus there is a fat containing ventral hernia which is small in size. In the upper abdomen there is a small midline ventral hernia containing nondilated bowel loops. Appearance is similar to prior. There is no ascites. Musculoskeletal: Left hip arthroplasty is present. There severe degenerative changes of the right hip. Review of the MIP images confirms the above findings. IMPRESSION: 1. No evidence for pulmonary embolism. 2. Stable dilatation of the main pulmonary artery compatible with pulmonary arterial hypertension. 3. 4.1 cm ascending aortic aneurysm. Recommend annual imaging followup by CTA or MRA. This recommendation follows 2010 ACCF/AHA/AATS/ACR/ASA/SCA/SCAI/SIR/STS/SVM Guidelines for the Diagnosis and Management of Patients with Thoracic Aortic Disease. Circulation. 2010; 121: Z733-z630. Aortic  aneurysm NOS (ICD10-I71.9) 4. No acute localizing process in the abdomen or pelvis. 5. Colonic diverticulosis. 6. Fatty infiltration of the liver. 7. Stable ventral hernias. Aortic Atherosclerosis (ICD10-I70.0). Electronically Signed   By: Greig Pique M.D.   On: 11/13/2023 19:09    Scheduled Meds:  apixaban   5 mg Oral BID   cilostazol   50 mg Oral BID   insulin  aspart  0-9 Units Subcutaneous TID WC   mirtazapine   7.5 mg Oral Daily   pantoprazole   40 mg Oral Daily   potassium chloride  SA  20 mEq Oral Daily   Continuous Infusions:  sodium chloride  100 mL/hr at 11/14/23 0228   magnesium  sulfate bolus IVPB 2 g (11/14/23 0802)   potassium PHOSPHATE  IVPB (in mmol)       LOS: 0 days   Ivonne Mustache, MD [facility] P11/13/2025, 8:14 AM

## 2023-11-14 NOTE — Progress Notes (Signed)
   11/14/23 0502  Provider Notification  Provider Name/Title Opyd,MD  Date Provider Notified 11/14/23  Time Provider Notified 0503  Method of Notification Page  Notification Reason Critical Result (Calcium  level= 6.4)  Provider response See new orders  Date of Provider Response 11/14/23  Time of Provider Response [phone]

## 2023-11-14 NOTE — TOC Initial Note (Signed)
 Transition of Care [facility]) - Initial/Assessment Note    Patient Details  Name: Angie Carr MRN: [id number] Date of Birth: Mar 19, 1955  [facility] CM/SW Contact:    Lendia Dais, LCSWA Phone Number: 11/14/2023, 4:34 PM  Clinical Narrative: Pt is from home with son and grandson and has DME of a cane and a walker. Pt states the her Sister Ronal is her preferred contact and HCPOA. Pt states she does not remember if she filled out the [facility] paperwork, she stated she would like to check first before doing any other paperwork.   Pt is able to afford basic needs and has no SDOH concerns. Pt's source of income is disability and uses [employer] as a means transportation as well as family.  Pt takes meds as prescribed, has seen PCP Kennieth Leech in the last year and has a hx [facility] in October 2025.    CSW spoke to pt about PT recs of SNF and pt was agreeable. CSW gave pt medicare.gov list and explained the SNF process.  CSW sent out referrals in the HUB and offers are pending as well as the PASRR. CSW will upload necessary documents for PASRR tomorrow.  CSW will continue to monitor.           Expected Discharge Plan: Skilled Nursing Facility Barriers to Discharge: Continued Medical Work up   Patient Goals and CMS Choice Patient states their goals for this hospitalization and ongoing recovery are:: Walking and get back to their normal state CMS Medicare.gov Compare Post Acute Care list provided to:: Patient Choice offered to / list presented to : Patient      Expected Discharge Plan and Services In-house Referral: Clinical Social Work   Post Acute Care Choice: Skilled Nursing Facility Living arrangements for the past 2 months: Single Family Home                                      Prior Living Arrangements/Services Living arrangements for the past 2 months: Single Family Home Lives with:: Adult Children, Relatives Patient language and need for interpreter  reviewed:: Yes        Need for Family Participation in Patient Care: Yes (Comment) Care giver support system in place?: Yes (comment) Current home services: DME (Cane, walker) Criminal Activity/Legal Involvement Pertinent to Current Situation/Hospitalization: No - Comment as needed  Activities of Daily Living   ADL Screening (condition at time of admission) Independently performs ADLs?: Yes (appropriate for developmental age) Is the patient deaf or have difficulty hearing?: No Does the patient have difficulty seeing, even when wearing glasses/contacts?: No Does the patient have difficulty concentrating, remembering, or making decisions?: No  Permission Sought/Granted      Share Information with NAME: Ronal     Permission granted to share info w Relationship: Sister  Permission granted to share info w Contact Information: [phone]  Emotional Assessment Appearance:: Appears stated age Attitude/Demeanor/Rapport: Engaged Affect (typically observed): Appropriate, Pleasant Orientation: : Oriented to Situation, Oriented to Self, Oriented to Place, Oriented to  Time Alcohol  / Substance Use: Not Applicable Psych Involvement: No (comment)  Admission diagnosis:  Hypokalemia [E87.6] Hypomagnesemia [E83.42] Patient Active Problem List   Diagnosis Date Noted   Abdominal aortic aneurysm (AAA) >39 mm diameter 11/14/2023   Gastroesophageal reflux disease 11/14/2023   Generalized weakness 11/14/2023   Dizziness 11/14/2023   History of pulmonary embolism 11/14/2023   Hypokalemia 11/13/2023  Dysgeusia 09/18/2023   Nausea and vomiting 09/18/2023   Dehydration 09/15/2023   COVID-19 09/15/2023   Hypomagnesemia 09/15/2023   Protein calorie malnutrition 09/15/2023   Uncontrolled type 2 diabetes mellitus with hypoglycemia, without long-term current use of insulin  (HCC) 09/15/2023   Peripheral arterial disease 08/29/2023   Diarrhea 08/29/2023   Pulmonary emboli (HCC) 08/29/2023    Pulmonary embolism (HCC) 08/28/2023   Febrile illness 12/11/2014   Severe sepsis (HCC) 12/11/2014   Hypotension 12/11/2014   Acute kidney injury 12/11/2014   Trichomonal vaginitis    Absolute anemia    Tobacco abuse 11/04/2012   Diabetes mellitus, type 2 (HCC) 03/18/2012   Morbid obesity (HCC) 03/18/2012   Hypertension associated with diabetes (HCC) 03/18/2012   Hyperlipidemia LDL goal <70 03/18/2012   Arthritis 03/18/2012   PCP:  Benjamine Aland, MD Pharmacy:   [facility] [address] ([place]), [place] - [address] [place]) [place] [address] Phone: [phone] Fax: [phone]  [facility] - [place], [place] - [address] [place] [address] Phone: [phone] Fax: [phone]  [facility] [address] [place] [address] Phone: [phone] Fax: [phone]     Social Drivers of Health (SDOH) Social History: SDOH Screenings   Food Insecurity: No Food Insecurity (11/14/2023)  Housing: Low Risk  (11/14/2023)  Transportation Needs: No Transportation Needs (11/14/2023)  Utilities: Not At Risk (11/14/2023)  Depression (PHQ2-9): Low Risk  (10/17/2023)  Financial Resource Strain: Low Risk  (11/04/2023)  Physical Activity: Inactive (11/04/2023)  Social Connections: Moderately Integrated (11/14/2023)  Stress: No Stress Concern Present (11/04/2023)  Tobacco Use: High Risk (11/14/2023)  Health Literacy: Adequate Health Literacy (11/04/2023)   SDOH Interventions:     Readmission Risk Interventions    09/17/2023    3:57 PM 08/29/2023    2:12 PM  Readmission Risk Prevention Plan  Transportation Screening Complete Complete  PCP or Specialist Appt within 5-7 Days  Complete  Home Care Screening Complete Complete  Medication Review (RN CM) Complete Referral to Pharmacy

## 2023-11-14 NOTE — Evaluation (Signed)
 Physical Therapy Evaluation Patient Details Name: Angie Carr MRN: [id number] DOB: January 08, 1955 Today's Date: 11/14/2023  History of Present Illness  Pt is a 68 y.o. female who presented 11/10/23 due to dizziness and weakness. Pt was admitted due to hypokalemia. PMHx: DM2 , HTN, peripheral vascular disease, PE , anxiety asthma,  hypertension, neuromuscular disorder, COVID, DJD   Clinical Impression  Pt presents with condition above and deficits mentioned below, see PT Problem List. PTA, she was mod I using a rollator for functional mobility, living with her son and grandson in a 1-level house with a ramped entry. Her son is w/c bound and cannot physically assist and her grandson works. She reports her son's RN can assist her PRN though. Currently, she is limited by fatigue and vertigo. See Vestibular Assessment below. She is also displaying deficits in generalized strength, balance, power, and activity tolerance and is at risk for falls. She is currently needing up to [facility] for bed mobility, minA for transfers, and CGA to take steps along EOB with a RW. She deferred ambulating further due to her fatigue at this time. Since the pt has had a functional decline and appears to only have PRN assistance available to her at d/c, she could benefit from short-term inpatient rehab, < 3 hours/day. Pt reports she will be able to safely d/c home though. If she does decide to d/c home instead then she could benefit from HHPT with Vestibular PT included. Will continue to follow acutely.    Vestibular Assessment - 11/14/23 0001       Vestibular Assessment   General Observation The pt had direction changing nystagmus with smooth pursuits bil, which could indicate a central origin of her vertigo. Notified MD who also assessed pt. The pt also had oppositive nystagmus with each head impulse test. However, her vertigo was only strongly reproduced with L Trenda Craze testing. Thus, as it could be L posterior canal  BPPV, treated pt with x1 L Epley maneuver. Notified MD of findings.      Symptom Behavior   Subjective history of current problem Pt reports she spontaneously began to have vertigo in August 2025 without any known fall, head injury, or cause. She reports it has stayed about the same, occuring 2-3x/day and lasting ~5-6 min at a time, normally when she turns her head to the L. She denies any hx of vertigo or use of meclizine.    Type of Dizziness  Vertigo    Frequency of Dizziness 2-3x/day    Duration of Dizziness 5-6 min per pt; yet lasted <1 min at a time when reproduced this session    Symptom Nature Motion provoked    Aggravating Factors Sitting with head tilted back;Turning head sideways;Comment   turning head to L   Relieving Factors Head stationary;Lying supine    Progression of Symptoms No change since onset    History of similar episodes none      Oculomotor Exam   Oculomotor Alignment Normal    Ocular ROM WFL    Spontaneous Absent    Smooth Pursuits Comment   direction changing nystagmus >4 beats noted at end range bil   Comment Noted >4 beat direction changing nystagmus with end range tracking bil. Notified MD who also assessed pt      Oculomotor Exam-Fixation Suppressed    Ocular Alignment Normal    Ocular ROM WFL    Left Head Impulse R nystagmus noted    Right Head Impulse L nystagmus noted  Positional Testing   Dix-Hallpike Dix-Hallpike Right;Dix-Hallpike Left    Horizontal Canal Testing Horizontal Canal Right;Horizontal Canal Left      Dix-Hallpike Right   Dix-Hallpike Right Duration mild dizziness, no nystagmus noted    Dix-Hallpike Right Symptoms No nystagmus   reported dizziness mildly     Dix-Hallpike Left   Dix-Hallpike Left Duration <60 seconds    Dix-Hallpike Left Symptoms Upbeat, left rotatory nystagmus   from what was able to be observed, pt often closed eyes or looked away, making it difficult to assess direction of nystagmus     Horizontal Canal Right    Horizontal Canal Right Duration negative    Horizontal Canal Right Symptoms Normal      Horizontal Canal Left   Horizontal Canal Left Duration negative    Horizontal Canal Left Symptoms Normal      Orthostatics   BP supine (x 5 minutes) 122/79    HR supine (x 5 minutes) 86    BP sitting 128/71    HR sitting 84    BP standing (after 1 minute) 141/91    HR standing (after 1 minute) 118    Orthostatics Comment negative, no dizziness reported either; BP 135/72 with HR 86 bpm sitting after taking steps                 If plan is discharge home, recommend the following: A little help with walking and/or transfers;A little help with bathing/dressing/bathroom;Assistance with cooking/housework;Assist for transportation;Help with stairs or ramp for entrance   Can travel by private vehicle   Yes    Equipment Recommendations BSC/3in1  Recommendations for Other Services       Functional Status Assessment Patient has had a recent decline in their functional status and demonstrates the ability to make significant improvements in function in a reasonable and predictable amount of time.     Precautions / Restrictions Precautions Precautions: Fall Recall of Precautions/Restrictions: Impaired Precaution/Restrictions Comments: L posterior canal BPPV Restrictions Weight Bearing Restrictions Per Provider Order: No      Mobility  Bed Mobility Overal bed mobility: Needs Assistance Bed Mobility: Supine to Sit, Sit to Supine, Rolling Rolling: Min assist, Used rails   Supine to sit: Mod assist, HOB elevated, Used rails Sit to supine: Mod assist   General bed mobility comments: Cues needed to bring each leg off L EOB and grab therapist's hand to ascend trunk, modA at legs and trunk to sit up. ModA to lift bil legs back onto bed with return to supine. Cues provided to pt to use bil rails to shift shoulders to get midline in bed    Transfers Overall transfer level: Needs  assistance Equipment used: Rolling walker (2 wheels) Transfers: Sit to/from Stand Sit to Stand: Min assist           General transfer comment: Cues needed to place feet posteriorly under her and push up with one hand on the bed rather than pull up on RW with bil hands, minA needed to initiate boost up to stand from low EOB height.    Ambulation/Gait Ambulation/Gait assistance: Contact guard assist Gait Distance (Feet): 3 Feet Assistive device: Rolling walker (2 wheels) Gait Pattern/deviations: Decreased step length - right, Decreased step length - left, Decreased stride length Gait velocity: reduced Gait velocity interpretation: <1.31 ft/sec, indicative of household ambulator   General Gait Details: Pt takes slow, small lateral steps along EOB, deferring futher gait due to fatigue and wanting to nap. No LOB, CGA for safety  Stairs            Wheelchair Mobility     Tilt Bed    Modified Rankin (Stroke Patients Only)       Balance Overall balance assessment: Needs assistance Sitting-balance support: Feet supported Sitting balance-Leahy Scale: Fair Sitting balance - Comments: needed BUE support   Standing balance support: Bilateral upper extremity supported Standing balance-Leahy Scale: Poor Standing balance comment: reliant on BUE support                             Pertinent Vitals/Pain Pain Assessment Pain Assessment: Faces Faces Pain Scale: Hurts little more Pain Location: BLE Pain Descriptors / Indicators: Grimacing, Guarding, Discomfort, Sore Pain Intervention(s): Limited activity within patient's tolerance, Monitored during session, Repositioned    Home Living Family/patient expects to be discharged to:: Private residence Living Arrangements: Children;Other relatives (son and grandson) Available Help at Discharge: Family;Available PRN/intermittently (son is w/c bound with [facility] RN who can assist pt as well intermittently, grandson works) Type  of Home: House Home Access: Ramped entrance       Home Layout: One level Home Equipment: Cane - single point;Rollator (4 wheels);Toilet riser Additional Comments: son is indep at [facility] level, he cannot provide physical assist    Prior Function Prior Level of Function : Independent/Modified Independent             Mobility Comments: Ambulates with using rollator and mainly remains in the home ADLs Comments: last time in chart SCAT for transportation or family/friends.     Extremity/Trunk Assessment   Upper Extremity Assessment Upper Extremity Assessment: Defer to OT evaluation    Lower Extremity Assessment Lower Extremity Assessment: Generalized weakness;LLE deficits/detail;RLE deficits/detail RLE Deficits / Details: strength testing limited by generalized pain/soreness with palpation, MMT score 4- quads; denied numbness/tingling bil legs LLE Deficits / Details: strength testing limited by generalized pain/soreness with palpation (L worse than R), MMT score 3+ quads; denied numbness/tingling bil legs       Communication   Communication Communication: No apparent difficulties    Cognition Arousal: Alert Behavior During Therapy: WFL for tasks assessed/performed, Anxious   PT - Cognitive impairments: No apparent impairments                       PT - Cognition Comments: Anxious with L posterior canal BPPV testing and treatment Following commands: Intact       Cueing Cueing Techniques: Verbal cues     General Comments General comments (skin integrity, edema, etc.): BP 135/72 (91) & HR 86 bpm sitting after stepping along EOB    Exercises Other Exercises Other Exercises: x1 L Epley maneuver at EOB   Assessment/Plan    PT Assessment Patient needs continued PT services  PT Problem List Decreased strength;Decreased activity tolerance;Decreased balance;Decreased mobility       PT Treatment Interventions DME instruction;Gait training;Functional mobility  training;Therapeutic activities;Therapeutic exercise;Balance training;Neuromuscular re-education;Patient/family education    PT Goals (Current goals can be found in the Care Plan section)  Acute Rehab PT Goals Patient Stated Goal: to get better PT Goal Formulation: With patient Time For Goal Achievement: 11/28/23 Potential to Achieve Goals: Good    Frequency Min 3X/week     Co-evaluation               AM-PAC PT 6 Clicks Mobility  Outcome Measure Help needed turning from your back to your side while in a flat bed without using  bedrails?: A Little Help needed moving from lying on your back to sitting on the side of a flat bed without using bedrails?: A Lot Help needed moving to and from a bed to a chair (including a wheelchair)?: A Little Help needed standing up from a chair using your arms (e.g., wheelchair or bedside chair)?: A Little Help needed to walk in hospital room?: Total (<20 ft) Help needed climbing 3-5 steps with a railing? : Total 6 Click Score: 13    End of Session Equipment Utilized During Treatment: Gait belt Activity Tolerance: Patient tolerated treatment well;Patient limited by fatigue Patient left: in bed;with call bell/phone within reach;with bed alarm set Nurse Communication: Mobility status;Other (comment) (vestibular assessment results; notified MD of direction changing nystagmus as well) PT Visit Diagnosis: Unsteadiness on feet (R26.81);Other abnormalities of gait and mobility (R26.89);Muscle weakness (generalized) (M62.81);Difficulty in walking, not elsewhere classified (R26.2);BPPV;Dizziness and giddiness (R42) BPPV - Right/Left : Left    Time: 8997-8950 PT Time Calculation (min) (ACUTE ONLY): 47 min   Charges:   PT Evaluation $PT Eval Moderate Complexity: 1 Mod PT Treatments $Therapeutic Activity: 8-22 mins $Canalith Rep Proc: 8-22 mins PT General Charges $$ ACUTE PT VISIT: 1 Visit         Theo Ferretti, PT, DPT Acute Rehabilitation  Services  Office: [phone]   Theo CHRISTELLA Ferretti 11/14/2023, 11:03 AM

## 2023-11-14 NOTE — Care Management Obs Status (Signed)
 MEDICARE OBSERVATION STATUS NOTIFICATION   Patient Details  Name: Angie Carr MRN: [id number] Date of Birth: December 14, 1955   Medicare Observation Status Notification Given:  Yes  Obs notice signed and copy given.  Aranda Bihm 11/14/2023, 10:07 AM

## 2023-11-15 DIAGNOSIS — E876 Hypokalemia: Secondary | ICD-10-CM | POA: Diagnosis not present

## 2023-11-15 LAB — BASIC METABOLIC PANEL WITH GFR
Anion gap: 9 (ref 5–15)
BUN: 9 mg/dL (ref 8–23)
CO2: 18 mmol/L — ABNORMAL LOW (ref 22–32)
Calcium: 6.6 mg/dL — ABNORMAL LOW (ref 8.9–10.3)
Chloride: 111 mmol/L (ref 98–111)
Creatinine, Ser: 0.73 mg/dL (ref 0.44–1.00)
GFR, Estimated: >60 mL/min (ref >60)
Glucose, Bld: 77 mg/dL (ref 70–99)
Potassium: 3.8 mmol/L (ref 3.5–5.1)
Sodium: 138 mmol/L (ref 135–145)

## 2023-11-15 LAB — GLUCOSE, CAPILLARY
Glucose-Capillary: 86 mg/dL (ref 70–99)
Glucose-Capillary: 94 mg/dL (ref 70–99)
Glucose-Capillary: 81 mg/dL (ref 70–99)
Glucose-Capillary: 85 mg/dL (ref 70–99)

## 2023-11-15 LAB — PHOSPHORUS: Phosphorus: 2.6 mg/dL (ref 2.5–4.6)

## 2023-11-15 LAB — MAGNESIUM: Magnesium: 1.3 mg/dL — ABNORMAL LOW (ref 1.7–2.4)

## 2023-11-15 MED ORDER — MAGNESIUM SULFATE 4 GM/100ML IV SOLN
4.0000 g | Freq: Once | INTRAVENOUS | Status: AC
  Administered 2023-11-15: 4 g via INTRAVENOUS
  Filled 2023-11-15: qty 100

## 2023-11-15 NOTE — Progress Notes (Signed)
 PROGRESS NOTE  Angie Carr  FMW:[id number] DOB: 03-16-1955 DOA: 11/13/2023 PCP: Benjamine Aland, MD   Brief Narrative: Patient is a 68 year old female with history of hypertension, diabetes type 2, PE on Eliquis , peripheral artery disease, GERD, arthritis, anemia who presented with complaints of dizziness, weakness.  She was recently hospitalized in September when she was found to have hypokalemia/hypomagnesemia.  She was taking supplementation at home but recently discontinued by her PCP.  On presentation, she was hemodynamically stable.  Lab work showed potassium of 2.4, magnesium  of 0.5.  EKG showed normal sinus rhythm with occasional PVCs/PACs.  CTA chest study negative for PE but showed 4.1 cm ascending aortic aneurysm.  Patient admitted for the management of severe deficiency of electrolytes.  Currently being monitored and supplemented as needed.  PT recommended SNF on discharge.  Medically stable for discharge whenever possible.  Assessment & Plan:  Principal Problem:   Hypokalemia Active Problems:   Abdominal aortic aneurysm (AAA) >39 mm diameter   Gastroesophageal reflux disease   Generalized weakness   Dizziness   History of pulmonary embolism   Hypokalemia/hypomagnesemia: Presented with weakness, dizziness.  Severely deficient in electrolytes.  Continue monitoring and supplementation.  Denies any nausea, vomiting or diarrhea currently.  Abdomen benign on examination  Vertigo: Complains of positional vertigo with room spinning around her.  Noticed to have nystagmus as well.  Likely BPPV. status post Epley's maneuver .PT recommending SNF on discharge.  Started on meclizine.  No focal deficits .so MRI deferred  Type 2 diabetes: Well-controlled.  Recent A1c of 4.8.  Recently taken off from metformin  by her PCP.  Continue sliding scale  Generalized weakness/dizziness/deconditioning: PT requested.  Orthostatic vitals negative.  Recommended SNF on discharge.  TOC consulted.  With  family.  Ambulates with a walker  History of PE: On Eliquis   Ascending aortic aneurysm: CTA chest showed 4.1 cm ascending aortic aneurysm.  Needs annual follow-up imaging by CTA or MRA.  She needs to follow-up with  vascular surgery as an outpatient on discharge.  History of peripheral artery disease: Continue cilostazol   GERD: Continue PPI  Anxiety: On mirtazipine     Wound 11/13/23 2300 Pressure Injury Coccyx Stage 2 -  Partial thickness loss of dermis presenting as a shallow open injury with a red, pink wound bed without slough. (Active)    DVT prophylaxis: apixaban  (ELIQUIS ) tablet 5 mg     Code Status: Full Code  Family Communication: None at bedside  Patient status:Inpatient  Patient is from :Home  Anticipated discharge to:SNF  Estimated DC date:whenever possible   Consultants: None  Procedures:None  Antimicrobials:  Anti-infectives (From admission, onward)    None       Subjective: Patient seen and examined at bedside today.  Hemodynamically stable.  Lying in bed.  Appears comfortable today.  Feels much better today.  No dizziness this morning.  Agreeable for SNF.  Waiting for bed.  Objective: Vitals:   11/14/23 1629 11/14/23 2044 11/15/23 0438 11/15/23 0812  BP: 124/73 121/65 126/66 (!) 149/81  Pulse: 88 87 88 100  Resp: 18 18 18 18   Temp: 97.9 F (36.6 C) 97.6 F (36.4 C) 98.2 F (36.8 C) 98.6 F (37 C)  TempSrc:      SpO2: 100% 100% 98% 98%  Weight:      Height:        Intake/Output Summary (Last 24 hours) at 11/15/2023 1107 Last data filed at 11/15/2023 0454 Gross per 24 hour  Intake 120 ml  Output 650  ml  Net -530 ml   Filed Weights   11/13/23 1627 11/14/23 0113  Weight: 74.4 kg 77.8 kg    Examination:   General exam: Overall comfortable, not in distress HEENT: PERRL Respiratory system:  no wheezes or crackles  Cardiovascular system: S1 & S2 heard, RRR.  Gastrointestinal system: Abdomen is nondistended, soft and  nontender. Central nervous system: Alert and oriented Extremities: No edema, no clubbing ,no cyanosis Skin: No rashes, no ulcers,no icterus     Data Reviewed: I have personally reviewed following labs and imaging studies  CBC: Recent Labs  Lab 11/13/23 1626 11/13/23 1807 11/14/23 0407  WBC 5.1  --  4.6  HGB 11.5* 11.9* 9.3*  HCT 34.0* 35.0* 26.4*  MCV 95.5  --  92.3  PLT 164  --  133*   Basic Metabolic Panel: Recent Labs  Lab 11/13/23 1626 11/13/23 1749 11/13/23 1807 11/14/23 0407 11/15/23 0414  NA 144  --  144 142 138  K 3.3*  --  2.4* 4.2 3.8  CL 107  --  108 114* 111  CO2 15*  --   --  14* 18*  GLUCOSE 81  --  80 70 77  BUN 15  --  14 13 9   CREATININE 1.26*  --  0.80 0.99 0.73  CALCIUM  6.9*  --   --  6.4* 6.6*  MG  --  0.5*  --  1.2* 1.3*  PHOS  --   --   --  1.3* 2.6     Recent Results (from the past 240 hours)  Resp panel by RT-PCR (RSV, Flu A&B, Covid) Anterior Nasal Swab     Status: None   Collection Time: 11/13/23  6:05 PM   Specimen: Anterior Nasal Swab  Result Value Ref Range Status   SARS Coronavirus 2 by RT PCR NEGATIVE NEGATIVE Final   Influenza A by PCR NEGATIVE NEGATIVE Final   Influenza B by PCR NEGATIVE NEGATIVE Final    Comment: (NOTE) The Xpert Xpress SARS-CoV-2/FLU/RSV plus assay is intended as an aid in the diagnosis of influenza from Nasopharyngeal swab specimens and should not be used as a sole basis for treatment. Nasal washings and aspirates are unacceptable for Xpert Xpress SARS-CoV-2/FLU/RSV testing.  Fact Sheet for Patients: bloggercourse.com  Fact Sheet for Healthcare Providers: seriousbroker.it  This test is not yet approved or cleared by the [place]  FDA and has been authorized for detection and/or diagnosis of SARS-CoV-2 by FDA under an Emergency Use Authorization (EUA). This EUA will remain in effect (meaning this test can be used) for the duration of the COVID-19  declaration under Section 564(b)(1) of the Act, 21 U.S.C. section 360bbb-3(b)(1), unless the authorization is terminated or revoked.     Resp Syncytial Virus by PCR NEGATIVE NEGATIVE Final    Comment: (NOTE) Fact Sheet for Patients: bloggercourse.com  Fact Sheet for Healthcare Providers: seriousbroker.it  This test is not yet approved or cleared by the [place]  FDA and has been authorized for detection and/or diagnosis of SARS-CoV-2 by FDA under an Emergency Use Authorization (EUA). This EUA will remain in effect (meaning this test can be used) for the duration of the COVID-19 declaration under Section 564(b)(1) of the Act, 21 U.S.C. section 360bbb-3(b)(1), unless the authorization is terminated or revoked.  Performed at [facility], [address], [place], [place] [address]      Radiology Studies: CT Angio Chest PE W and/or Wo Contrast Result Date: 11/13/2023 CLINICAL DATA:  High probability for PE.  Acute abdominal pain. EXAM: CT ANGIOGRAPHY CHEST CT ABDOMEN AND PELVIS WITH CONTRAST TECHNIQUE: Multidetector CT imaging of the chest was performed using the standard protocol during bolus administration of intravenous contrast. Multiplanar CT image reconstructions and MIPs were obtained to evaluate the vascular anatomy. Multidetector CT imaging of the abdomen and pelvis was performed using the standard protocol during bolus administration of intravenous contrast. RADIATION DOSE REDUCTION: This exam was performed according to the departmental dose-optimization program which includes automated exposure control, adjustment of the mA and/or kV according to patient size and/or use of iterative reconstruction technique. CONTRAST:  75mL OMNIPAQUE  IOHEXOL  350 MG/ML SOLN COMPARISON:  CT abdomen and pelvis 09/15/2014. CT angiogram chest 08/28/2023. FINDINGS: CTA CHEST FINDINGS Cardiovascular: The ascending aorta is dilated measuring 4.1  cm. There are atherosclerotic calcifications of the aorta and coronary arteries. The heart is mildly enlarged. There is no pericardial effusion. There is adequate opacification of the pulmonary arteries to the segmental level. The main pulmonary artery is dilated, unchanged. No pulmonary embolism identified. Mediastinum/Nodes: No enlarged mediastinal, hilar, or axillary lymph nodes. Thyroid gland, trachea, and esophagus demonstrate no significant findings. Lungs/Pleura: Mild emphysema present. The lungs are otherwise clear. No pleural effusion or pneumothorax. Musculoskeletal: No chest wall abnormality. No acute or significant osseous findings. Review of the MIP images confirms the above findings. CT ABDOMEN and PELVIS FINDINGS Hepatobiliary: There is fatty infiltration of the liver. The gallbladder surgically absent. There is no biliary ductal dilatation. Pancreas: Unremarkable. No pancreatic ductal dilatation or surrounding inflammatory changes. Spleen: Normal in size without focal abnormality. Adrenals/Urinary Tract: The bladder is grossly within normal limits, but evaluation is limited secondary to streak artifact in the pelvis. The bilateral kidneys adrenal glands appear normal. Stomach/Bowel: Stomach is within normal limits. No evidence of bowel wall thickening, distention, or inflammatory changes. There are scattered colonic diverticula. The appendix is not visualized. Vascular/Lymphatic: Aortic atherosclerosis. No enlarged abdominal or pelvic lymph nodes. Reproductive: Status post hysterectomy. No adnexal masses. Other: Of the level of the umbilicus there is a fat containing ventral hernia which is small in size. In the upper abdomen there is a small midline ventral hernia containing nondilated bowel loops. Appearance is similar to prior. There is no ascites. Musculoskeletal: Left hip arthroplasty is present. There severe degenerative changes of the right hip. Review of the MIP images confirms the above  findings. IMPRESSION: 1. No evidence for pulmonary embolism. 2. Stable dilatation of the main pulmonary artery compatible with pulmonary arterial hypertension. 3. 4.1 cm ascending aortic aneurysm. Recommend annual imaging followup by CTA or MRA. This recommendation follows 2010 ACCF/AHA/AATS/ACR/ASA/SCA/SCAI/SIR/STS/SVM Guidelines for the Diagnosis and Management of Patients with Thoracic Aortic Disease. Circulation. 2010; 121: Z733-z630. Aortic aneurysm NOS (ICD10-I71.9) 4. No acute localizing process in the abdomen or pelvis. 5. Colonic diverticulosis. 6. Fatty infiltration of the liver. 7. Stable ventral hernias. Aortic Atherosclerosis (ICD10-I70.0). Electronically Signed   By: Greig Pique M.D.   On: 11/13/2023 19:09   CT ABDOMEN PELVIS W CONTRAST Result Date: 11/13/2023 CLINICAL DATA:  High probability for PE.  Acute abdominal pain. EXAM: CT ANGIOGRAPHY CHEST CT ABDOMEN AND PELVIS WITH CONTRAST TECHNIQUE: Multidetector CT imaging of the chest was performed using the standard protocol during bolus administration of intravenous contrast. Multiplanar CT image reconstructions and MIPs were obtained to evaluate the vascular anatomy. Multidetector CT imaging of the abdomen and pelvis was performed using the standard protocol during bolus administration of intravenous contrast. RADIATION DOSE REDUCTION: This exam was performed according to the departmental dose-optimization program which  includes automated exposure control, adjustment of the mA and/or kV according to patient size and/or use of iterative reconstruction technique. CONTRAST:  75mL OMNIPAQUE  IOHEXOL  350 MG/ML SOLN COMPARISON:  CT abdomen and pelvis 09/15/2014. CT angiogram chest 08/28/2023. FINDINGS: CTA CHEST FINDINGS Cardiovascular: The ascending aorta is dilated measuring 4.1 cm. There are atherosclerotic calcifications of the aorta and coronary arteries. The heart is mildly enlarged. There is no pericardial effusion. There is adequate  opacification of the pulmonary arteries to the segmental level. The main pulmonary artery is dilated, unchanged. No pulmonary embolism identified. Mediastinum/Nodes: No enlarged mediastinal, hilar, or axillary lymph nodes. Thyroid gland, trachea, and esophagus demonstrate no significant findings. Lungs/Pleura: Mild emphysema present. The lungs are otherwise clear. No pleural effusion or pneumothorax. Musculoskeletal: No chest wall abnormality. No acute or significant osseous findings. Review of the MIP images confirms the above findings. CT ABDOMEN and PELVIS FINDINGS Hepatobiliary: There is fatty infiltration of the liver. The gallbladder surgically absent. There is no biliary ductal dilatation. Pancreas: Unremarkable. No pancreatic ductal dilatation or surrounding inflammatory changes. Spleen: Normal in size without focal abnormality. Adrenals/Urinary Tract: The bladder is grossly within normal limits, but evaluation is limited secondary to streak artifact in the pelvis. The bilateral kidneys adrenal glands appear normal. Stomach/Bowel: Stomach is within normal limits. No evidence of bowel wall thickening, distention, or inflammatory changes. There are scattered colonic diverticula. The appendix is not visualized. Vascular/Lymphatic: Aortic atherosclerosis. No enlarged abdominal or pelvic lymph nodes. Reproductive: Status post hysterectomy. No adnexal masses. Other: Of the level of the umbilicus there is a fat containing ventral hernia which is small in size. In the upper abdomen there is a small midline ventral hernia containing nondilated bowel loops. Appearance is similar to prior. There is no ascites. Musculoskeletal: Left hip arthroplasty is present. There severe degenerative changes of the right hip. Review of the MIP images confirms the above findings. IMPRESSION: 1. No evidence for pulmonary embolism. 2. Stable dilatation of the main pulmonary artery compatible with pulmonary arterial hypertension. 3. 4.1  cm ascending aortic aneurysm. Recommend annual imaging followup by CTA or MRA. This recommendation follows 2010 ACCF/AHA/AATS/ACR/ASA/SCA/SCAI/SIR/STS/SVM Guidelines for the Diagnosis and Management of Patients with Thoracic Aortic Disease. Circulation. 2010; 121: Z733-z630. Aortic aneurysm NOS (ICD10-I71.9) 4. No acute localizing process in the abdomen or pelvis. 5. Colonic diverticulosis. 6. Fatty infiltration of the liver. 7. Stable ventral hernias. Aortic Atherosclerosis (ICD10-I70.0). Electronically Signed   By: Greig Pique M.D.   On: 11/13/2023 19:09    Scheduled Meds:  apixaban   5 mg Oral BID   cilostazol   50 mg Oral BID   insulin  aspart  0-9 Units Subcutaneous TID WC   mirtazapine   7.5 mg Oral Daily   pantoprazole   40 mg Oral Daily   polyethylene glycol  17 g Oral Daily   potassium chloride  SA  20 mEq Oral Daily   senna-docusate  1 tablet Oral BID   Continuous Infusions:     LOS: 1 day   Ivonne Mustache, MD [facility] P11/14/2025, 11:07 AM

## 2023-11-15 NOTE — Plan of Care (Signed)
   Problem: Nutritional: Goal: Maintenance of adequate nutrition will improve Outcome: Progressing

## 2023-11-15 NOTE — TOC Progression Note (Signed)
 Transition of Care [facility]) - Progression Note    Patient Details  Name: Angie Carr MRN: [id number] Date of Birth: [birth year]  Transition of [facility]) CM/SW Contact  Lendia Dais, [place] Phone Number: 11/15/2023, 12:02 PM  Clinical Narrative:  CSW spoke to pt at bedside about bed offers and stated that they wanted to talk to family about the offers first before choosing. CSW asked for permission to call and update sister Ronal and the pt was agreeable.  CSW spoke to [facility] via phone and explained the SNF process and the pt wishing to have family assistance. Ronal stated understanding and said she was on her way to see the pt today as well as her niece.  CSW will continue to monitor.    Expected Discharge Plan: Skilled Nursing Facility Barriers to Discharge: Continued Medical Work up               Expected Discharge Plan and Services In-house Referral: Clinical Social Work   Post Acute Care Choice: Skilled Nursing Facility Living arrangements for the past 2 months: Single Family Home                                       Social Drivers of Health (SDOH) Interventions SDOH Screenings   Food Insecurity: No Food Insecurity (11/14/2023)  Housing: Low Risk  (11/14/2023)  Transportation Needs: No Transportation Needs (11/14/2023)  Utilities: Not At Risk (11/14/2023)  Depression (PHQ2-9): Low Risk  (10/17/2023)  Financial Resource Strain: Low Risk  (11/04/2023)  Physical Activity: Inactive (11/04/2023)  Social Connections: Moderately Integrated (11/14/2023)  Stress: No Stress Concern Present (11/04/2023)  Tobacco Use: High Risk (11/14/2023)  Health Literacy: Adequate Health Literacy (11/04/2023)    Readmission Risk Interventions    09/17/2023    3:57 PM 08/29/2023    2:12 PM  Readmission Risk Prevention Plan  Transportation Screening Complete Complete  PCP or Specialist Appt within 5-7 Days  Complete  Home Care Screening Complete Complete  Medication  Review (RN CM) Complete Referral to Pharmacy

## 2023-11-15 NOTE — Progress Notes (Signed)
 Physical Therapy Treatment Patient Details Name: Angie Carr MRN: [id number] DOB: 02-06-1955 Today's Date: 11/15/2023   History of Present Illness Pt is a 68 y.o. female who presented 11/10/23 due to dizziness and weakness. Pt was admitted due to hypokalemia. PMHx: DM2 , HTN, peripheral vascular disease, PE , anxiety asthma,  hypertension, neuromuscular disorder, COVID, DJD    PT Comments  Plan for session was to progress gait and continue to treat her L BPPV. However, pt reporting being too tired and nauseated to mobilize or attempt an Epley maneuver, only agreeing to bed level lower extremity exercises this date. Provided pt with L BPPV HEP & education handout with Access Code: [email or web address]. Educated pt on the importance of mobilizing and treating her BPPV to reduce her dizziness and to prevent muscle atrophy, deconditioning, and pressure ulcers. Educated her to roll every 2 hours. RN aware of rolling schedule as well per discussion with RN yesterday. The pt needed several rest breaks between sets and reps to complete all lower extremity exercises. She also displayed particular stiffness and weakness at her bil hips. Her R quads was weaker than her L with SAQ reps today. Will continue to follow acutely.     If plan is discharge home, recommend the following: A little help with walking and/or transfers;A little help with bathing/dressing/bathroom;Assistance with cooking/housework;Assist for transportation;Help with stairs or ramp for entrance   Can travel by private vehicle     Yes  Equipment Recommendations  BSC/3in1    Recommendations for Other Services       Precautions / Restrictions Precautions Precautions: Fall Recall of Precautions/Restrictions: Impaired Precaution/Restrictions Comments: L posterior canal BPPV Restrictions Weight Bearing Restrictions Per Provider Order: No     Mobility  Bed Mobility               General bed mobility comments: Pt declined any  attempts at mobility other than bed level lower extremity exercises this date, reporting being too nauseated this date    Transfers                   General transfer comment: Pt declined any attempts at mobility other than bed level lower extremity exercises this date, reporting being too nauseated this date    Ambulation/Gait               General Gait Details: Pt declined any attempts at mobility other than bed level lower extremity exercises this date, reporting being too nauseated this date   Stairs             Wheelchair Mobility     Tilt Bed    Modified Rankin (Stroke Patients Only)       Balance                                            Communication Communication Communication: No apparent difficulties  Cognition Arousal: Alert Behavior During Therapy: WFL for tasks assessed/performed, Anxious   PT - Cognitive impairments: No apparent impairments                       PT - Cognition Comments: Pt fearful of making her nausea worse Following commands: Intact      Cueing Cueing Techniques: Verbal cues  Exercises General Exercises - Lower Extremity Ankle Circles/Pumps: AROM, Both, 10 reps, Supine Short Arc Quad: AROM, Strengthening,  Both, 10 reps, Supine Heel Slides: AAROM, Both, 10 reps, Supine Hip ABduction/ADduction: AAROM, Strengthening, Both, 10 reps, Supine (blanket under leg to assist her)    General Comments General comments (skin integrity, edema, etc.): Provided pt with L BPPV HEP & education handout  with Access Code: [email or web address]. Educated pt on the importance of mobilizing and treating her BPPV to reduce her dizziness and to prevent muscle atrophy, deconditioning, and pressure ulcers. Educated her to roll every 2 hours. RN aware of rolling schedule as well per discussion with RN yesterday.      Pertinent Vitals/Pain Pain Assessment Pain Assessment: Faces Faces Pain Scale: Hurts little more Pain  Location: BLE Pain Descriptors / Indicators: Grimacing, Guarding, Discomfort, Sore Pain Intervention(s): Limited activity within patient's tolerance, Monitored during session, Repositioned    Home Living                          Prior Function            PT Goals (current goals can now be found in the care plan section) Acute Rehab PT Goals Patient Stated Goal: to get better PT Goal Formulation: With patient Time For Goal Achievement: 11/28/23 Potential to Achieve Goals: Good Progress towards PT goals: Progressing toward goals    Frequency    Min 3X/week      PT Plan      Co-evaluation              AM-PAC PT 6 Clicks Mobility   Outcome Measure  Help needed turning from your back to your side while in a flat bed without using bedrails?: A Little Help needed moving from lying on your back to sitting on the side of a flat bed without using bedrails?: A Lot Help needed moving to and from a bed to a chair (including a wheelchair)?: A Little Help needed standing up from a chair using your arms (e.g., wheelchair or bedside chair)?: A Little Help needed to walk in hospital room?: Total (<20 ft) Help needed climbing 3-5 steps with a railing? : Total 6 Click Score: 13    End of Session   Activity Tolerance: Patient limited by fatigue;Other (comment) (limited by nausea) Patient left: in bed;with call bell/phone within reach;with bed alarm set   PT Visit Diagnosis: Unsteadiness on feet (R26.81);Other abnormalities of gait and mobility (R26.89);Muscle weakness (generalized) (M62.81);Difficulty in walking, not elsewhere classified (R26.2);BPPV;Dizziness and giddiness (R42) BPPV - Right/Left : Left     Time: 8964-8895 PT Time Calculation (min) (ACUTE ONLY): 29 min  Charges:    $Therapeutic Exercise: 23-37 mins PT General Charges $$ ACUTE PT VISIT: 1 Visit                     Theo Ferretti, PT, DPT Acute Rehabilitation Services  Office:  [phone]    Theo CHRISTELLA Ferretti 11/15/2023, 11:13 AM

## 2023-11-16 DIAGNOSIS — E876 Hypokalemia: Secondary | ICD-10-CM | POA: Diagnosis not present

## 2023-11-16 LAB — GLUCOSE, CAPILLARY
Glucose-Capillary: 77 mg/dL (ref 70–99)
Glucose-Capillary: 77 mg/dL (ref 70–99)
Glucose-Capillary: 79 mg/dL (ref 70–99)
Glucose-Capillary: 79 mg/dL (ref 70–99)

## 2023-11-16 LAB — BASIC METABOLIC PANEL WITH GFR
Anion gap: 11 (ref 5–15)
BUN: 7 mg/dL — ABNORMAL LOW (ref 8–23)
CO2: 18 mmol/L — ABNORMAL LOW (ref 22–32)
Calcium: 6.9 mg/dL — ABNORMAL LOW (ref 8.9–10.3)
Chloride: 111 mmol/L (ref 98–111)
Creatinine, Ser: 0.73 mg/dL (ref 0.44–1.00)
GFR, Estimated: >60 mL/min (ref >60)
Glucose, Bld: 78 mg/dL (ref 70–99)
Potassium: 3.8 mmol/L (ref 3.5–5.1)
Sodium: 140 mmol/L (ref 135–145)

## 2023-11-16 LAB — MAGNESIUM: Magnesium: 1.9 mg/dL (ref 1.7–2.4)

## 2023-11-16 LAB — PHOSPHORUS: Phosphorus: 2.5 mg/dL (ref 2.5–4.6)

## 2023-11-16 MED ORDER — POTASSIUM CHLORIDE CRYS ER 20 MEQ PO TBCR
40.0000 meq | EXTENDED_RELEASE_TABLET | Freq: Once | ORAL | Status: AC
  Administered 2023-11-16: 40 meq via ORAL
  Filled 2023-11-16: qty 2

## 2023-11-16 MED ORDER — BISACODYL 10 MG RE SUPP
10.0000 mg | Freq: Once | RECTAL | Status: DC
  Filled 2023-11-16: qty 1

## 2023-11-16 MED ORDER — MAGNESIUM SULFATE 2 GM/50ML IV SOLN
2.0000 g | Freq: Once | INTRAVENOUS | Status: AC
  Administered 2023-11-16: 2 g via INTRAVENOUS
  Filled 2023-11-16: qty 50

## 2023-11-16 NOTE — Progress Notes (Signed)
 PROGRESS NOTE  Angie Carr  FMW:[id number] DOB: 12/27/1955 DOA: 11/13/2023 PCP: Benjamine Aland, MD   Brief Narrative: Patient is a 68 year old female with history of hypertension, diabetes type 2, PE on Eliquis , peripheral artery disease, GERD, arthritis, anemia who presented with complaints of dizziness, weakness.  She was recently hospitalized in September when she was found to have hypokalemia/hypomagnesemia.  She was taking supplementation at home but recently discontinued by her PCP.  On presentation, she was hemodynamically stable.  Lab work showed potassium of 2.4, magnesium  of 0.5.  EKG showed normal sinus rhythm with occasional PVCs/PACs.  CTA chest study negative for PE but showed 4.1 cm ascending aortic aneurysm.  Patient admitted for the management of severe deficiency of electrolytes.  Currently being monitored and supplemented as needed.  PT recommended SNF on discharge.  GI/speech therapy consulted for persistent dysphagia, poor oral intake.  Assessment & Plan:  Principal Problem:   Hypokalemia Active Problems:   Abdominal aortic aneurysm (AAA) >39 mm diameter   Gastroesophageal reflux disease   Generalized weakness   Dizziness   History of pulmonary embolism   Hypokalemia/hypomagnesemia: Presented with weakness, dizziness.  Severely deficient in electrolytes.  Continue monitoring and supplementation.  Denies any nausea, vomiting or diarrhea currently.  Abdomen benign on examination  Poor oral intake/dysphagia: Patient has poor appetite, choking history with food.  Has not have significant oral intake since last 3 to 4 months.  She seen by [facility] on September for the same.SABRA  No plan for invasive workup.  She has been following with [facility] and the plan is for colonoscopy/EGD next week.  As per patient and family request, we have consulted GI here.  Also consulted speech therapy, dietitian  Vertigo: Complains of positional vertigo with room spinning  around her.  Noticed to have nystagmus as well.  Likely BPPV. status post Epley's maneuver .PT recommending SNF on discharge.  Started on meclizine.  No focal deficits .so MRI deferred. vertigo has resolved  Type 2 diabetes: Well-controlled.  Recent A1c of 4.8.  Recently taken off from metformin  by her PCP.  Continue sliding scale  Generalized weakness/dizziness/deconditioning: PT requested.  Orthostatic vitals negative.  Recommended SNF on discharge.  TOC consulted.  With family.  Ambulates with a walker  History of PE: On Eliquis   Ascending aortic aneurysm: CTA chest showed 4.1 cm ascending aortic aneurysm.  Needs annual follow-up imaging by CTA or MRA.  She needs to follow-up with  vascular surgery as an outpatient on discharge.  History of peripheral artery disease: Continue cilostazol   GERD: Continue PPI  Anxiety: On mirtazipine  Constipation: Continue bowel regimen     Wound 11/13/23 2300 Pressure Injury Coccyx Stage 2 -  Partial thickness loss of dermis presenting as a shallow open injury with a red, pink wound bed without slough. (Active)    DVT prophylaxis: apixaban  (ELIQUIS ) tablet 5 mg     Code Status: Full Code  Family Communication: None at bedside  Patient status:Inpatient  Patient is from :Home  Anticipated discharge to:SNF  Estimated DC date:whenever possible   Consultants: None  Procedures:None  Antimicrobials:  Anti-infectives (From admission, onward)    None       Subjective: Patient seen and examined at bedside today.  Lying on bed.  Sister at bedside.  She reported long history of poor oral intake, difficulty of swallowing.  We discussed about consulting GI, speech therapy.  She denies abdomen pain, nausea or vomiting today.  No bowel movement for last  several days.  Objective: Vitals:   11/15/23 1700 11/15/23 2010 11/16/23 0511 11/16/23 0743  BP: 126/75 122/74 123/70 119/84  Pulse: 85 85 92 85  Resp: 18 18 18 19   Temp: 98.2 F (36.8  C) 98 F (36.7 C) 98 F (36.7 C) 98.3 F (36.8 C)  TempSrc:   Oral   SpO2: 100% 100% 98% 98%  Weight:      Height:        Intake/Output Summary (Last 24 hours) at 11/16/2023 1107 Last data filed at 11/15/2023 1300 Gross per 24 hour  Intake 120 ml  Output --  Net 120 ml   Filed Weights   11/13/23 1627 11/14/23 0113  Weight: 74.4 kg 77.8 kg    Examination:   General exam: Overall comfortable, not in distress, pleasant lady, deconditioned HEENT: PERRL Respiratory system:  no wheezes or crackles  Cardiovascular system: S1 & S2 heard, RRR.  Gastrointestinal system: Abdomen is nondistended, soft and nontender. Central nervous system: Alert and oriented Extremities: No edema, no clubbing ,no cyanosis Skin: No rashes, no ulcers,no icterus     Data Reviewed: I have personally reviewed following labs and imaging studies  CBC: Recent Labs  Lab 11/13/23 1626 11/13/23 1807 11/14/23 0407  WBC 5.1  --  4.6  HGB 11.5* 11.9* 9.3*  HCT 34.0* 35.0* 26.4*  MCV 95.5  --  92.3  PLT 164  --  133*   Basic Metabolic Panel: Recent Labs  Lab 11/13/23 1626 11/13/23 1749 11/13/23 1807 11/14/23 0407 11/15/23 0414 11/16/23 0450  NA 144  --  144 142 138 140  K 3.3*  --  2.4* 4.2 3.8 3.8  CL 107  --  108 114* 111 111  CO2 15*  --   --  14* 18* 18*  GLUCOSE 81  --  80 70 77 78  BUN 15  --  14 13 9  7*  CREATININE 1.26*  --  0.80 0.99 0.73 0.73  CALCIUM  6.9*  --   --  6.4* 6.6* 6.9*  MG  --  0.5*  --  1.2* 1.3* 1.9  PHOS  --   --   --  1.3* 2.6 2.5     Recent Results (from the past 240 hours)  Resp panel by RT-PCR (RSV, Flu A&B, Covid) Anterior Nasal Swab     Status: None   Collection Time: 11/13/23  6:05 PM   Specimen: Anterior Nasal Swab  Result Value Ref Range Status   SARS Coronavirus 2 by RT PCR NEGATIVE NEGATIVE Final   Influenza A by PCR NEGATIVE NEGATIVE Final   Influenza B by PCR NEGATIVE NEGATIVE Final    Comment: (NOTE) The Xpert Xpress SARS-CoV-2/FLU/RSV plus  assay is intended as an aid in the diagnosis of influenza from Nasopharyngeal swab specimens and should not be used as a sole basis for treatment. Nasal washings and aspirates are unacceptable for Xpert Xpress SARS-CoV-2/FLU/RSV testing.  Fact Sheet for Patients: bloggercourse.com  Fact Sheet for Healthcare Providers: seriousbroker.it  This test is not yet approved or cleared by the [place]  FDA and has been authorized for detection and/or diagnosis of SARS-CoV-2 by FDA under an Emergency Use Authorization (EUA). This EUA will remain in effect (meaning this test can be used) for the duration of the COVID-19 declaration under Section 564(b)(1) of the Act, 21 U.S.C. section 360bbb-3(b)(1), unless the authorization is terminated or revoked.     Resp Syncytial Virus by PCR NEGATIVE NEGATIVE Final    Comment: (NOTE) Fact Sheet  for Patients: bloggercourse.com  Fact Sheet for Healthcare Providers: seriousbroker.it  This test is not yet approved or cleared by the [place]  FDA and has been authorized for detection and/or diagnosis of SARS-CoV-2 by FDA under an Emergency Use Authorization (EUA). This EUA will remain in effect (meaning this test can be used) for the duration of the COVID-19 declaration under Section 564(b)(1) of the Act, 21 U.S.C. section 360bbb-3(b)(1), unless the authorization is terminated or revoked.  Performed at [facility], [address], [place], [place] [address]      Radiology Studies: No results found.   Scheduled Meds:  apixaban   5 mg Oral BID   cilostazol   50 mg Oral BID   insulin  aspart  0-9 Units Subcutaneous TID WC   mirtazapine   7.5 mg Oral Daily   pantoprazole   40 mg Oral Daily   polyethylene glycol  17 g Oral Daily   potassium chloride  SA  20 mEq Oral Daily   senna-docusate  1 tablet Oral BID   Continuous  Infusions:     LOS: 2 days   Ivonne Mustache, MD [facility] P11/15/2025, 11:07 AM

## 2023-11-16 NOTE — Consult Note (Addendum)
 CROSS COVER [facility] Patient's primary GI is now @ [facility]. Reason for Consult: Dehydration with difficulty swallowing. Referring Physician: [facility].  Angie Carr is an 68 y.o. female.  HPI: Angie Carr is a 68 year old black female with multiple medical problems listed below was sent to [facility] by her gastroenterologist at [facility] for further evaluation treatment of dehydration and electrolyte abnormalities. She claims since she had multiple dental extractions, 4 months ago, she has had problems swallowing. She was also noted to have a nonspecific esophageal dysmotility disorder on a barium swallow done on 09/18/2023 with disruption of the primary peristaltic waves in the distal esophagus on multiple swallows and no active reflux was noted 13 mm barium tablet passed normally without hesitancy. She saw a gastroenterologist at [facility] a couple of days ago and is scheduled to have a EGD and a colonoscopy in December 2025.  She denies having abdominal pain nausea or vomiting. She is tolerating soft foods well. She has lost some weight but cannot quantify how much weight she has lost.  Past Medical History:  Diagnosis Date   Anemia    Anxiety    Asthma    pt denies   Diabetes mellitus without complication (HCC)    DJD (degenerative joint disease)    lower back   GERD (gastroesophageal reflux disease)    Headache    miagraines, none in years   Hypertension    LBP (low back pain)    Morbid obesity (HCC)    Neuromuscular disorder (HCC)    feet tingle and her balance is sometimes a problem   Primary osteoarthritis of both knees    Smoker    Past Surgical History:  Procedure Laterality Date   ABDOMINAL HYSTERECTOMY     complete   CHOLECYSTECTOMY     laparascopic   COLONOSCOPY N/A 02/11/2014   Procedure: COLONOSCOPY;  Surgeon: Lamar Donnald GAILS, MD;  Location: [facility];  Service: Endoscopy;  Laterality: N/A;   HERNIA  REPAIR     ventral on left side.   TOTAL HIP ARTHROPLASTY Left 07/24/2023   Procedure: ARTHROPLASTY, HIP, TOTAL,POSTERIOR APPROACH;  Surgeon: Edna Toribio LABOR, MD;  Location: [facility];  Service: Orthopedics;  Laterality: Left;   TUBAL LIGATION     Family History  Problem Relation Age of Onset   Hypertension Other    Stroke Other    Social History:  reports that she has been smoking cigarettes. She has never used smokeless tobacco. She reports current drug use. Drug: Marijuana. She reports that she does not drink alcohol .  Allergies: No Known Allergies  Medications: I have reviewed the patient's current medications. Prior to Admission:  Medications Prior to Admission  Medication Sig Dispense Refill Last Dose/Taking   apixaban  (ELIQUIS ) 5 MG TABS tablet Take 2 tablets (10 mg total) by mouth 2 (two) times daily for 7 days, THEN 1 tablet (5 mg total) 2 (two) times daily. (Patient taking differently: Take 1 tablet (5 mg total) 2 (two) times daily. ) 208 tablet 0 11/13/2023 at  9:15 AM   cilostazol  (PLETAL ) 50 MG tablet Take 50 mg by mouth 2 (two) times daily.   11/13/2023 Morning   mirtazapine  (REMERON ) 7.5 MG tablet Take 7.5 mg by mouth daily.   11/12/2023   omeprazole  (PRILOSEC) 20 MG capsule Take 20 mg by mouth daily.   11/13/2023 Morning   metFORMIN  (GLUCOPHAGE ) 1000 MG tablet Take 1 tablet (1,000 mg total) by mouth 2 (two) times daily with  a meal. (Patient not taking: Reported on 11/13/2023) 180 tablet 0 Not Taking   Potassium Chloride  ER 20 MEQ TBCR Take 20 mEq by mouth in the morning. (Patient not taking: Reported on 11/13/2023)   Not Taking   pravastatin  (PRAVACHOL ) 40 MG tablet Take 40 mg by mouth daily in the afternoon. (Patient not taking: Reported on 11/13/2023)   Not Taking   Scheduled:  apixaban   5 mg Oral BID   bisacodyl  10 mg Rectal Once   cilostazol   50 mg Oral BID   insulin  aspart  0-9 Units Subcutaneous TID WC   mirtazapine   7.5 mg Oral Daily   pantoprazole   40 mg  Oral Daily   polyethylene glycol  17 g Oral Daily   potassium chloride  SA  20 mEq Oral Daily   senna-docusate  1 tablet Oral BID   Continuous: PRN:acetaminophen  **OR** acetaminophen , meclizine, ondansetron  **OR** ondansetron  (ZOFRAN ) IV, mouth rinse  Results for orders placed or performed during the hospital encounter of 11/13/23 (from the past 48 hours)  Glucose, capillary     Status: Abnormal   Collection Time: 11/14/23  4:31 PM  Result Value Ref Range   Glucose-Capillary 103 (H) 70 - 99 mg/dL    Comment: Glucose reference range applies only to samples taken after fasting for at least 8 hours.  Glucose, capillary     Status: None   Collection Time: 11/14/23  8:50 PM  Result Value Ref Range   Glucose-Capillary 75 70 - 99 mg/dL    Comment: Glucose reference range applies only to samples taken after fasting for at least 8 hours.   Comment 1 Document in Chart   Basic metabolic panel with GFR     Status: Abnormal   Collection Time: 11/15/23  4:14 AM  Result Value Ref Range   Sodium 138 135 - 145 mmol/L   Potassium 3.8 3.5 - 5.1 mmol/L   Chloride 111 98 - 111 mmol/L   CO2 18 (L) 22 - 32 mmol/L   Glucose, Bld 77 70 - 99 mg/dL    Comment: Glucose reference range applies only to samples taken after fasting for at least 8 hours.   BUN 9 8 - 23 mg/dL   Creatinine, Ser 9.26 0.44 - 1.00 mg/dL   Calcium  6.6 (L) 8.9 - 10.3 mg/dL   GFR, Estimated >39 >39 mL/min    Comment: (NOTE) Calculated using the CKD-EPI Creatinine Equation (2021)    Anion gap 9 5 - 15    Comment: Performed at [facility], [address] [place], [place] [address]  Phosphorus     Status: None   Collection Time: 11/15/23  4:14 AM  Result Value Ref Range   Phosphorus 2.6 2.5 - 4.6 mg/dL    Comment: Performed at [facility], [address] [place], [place] [address]  Magnesium      Status: Abnormal   Collection Time: 11/15/23  4:14 AM  Result Value Ref Range   Magnesium  1.3 (L) 1.7 - 2.4 mg/dL     Comment: Performed at [facility], [address], [place], [place] [address]  Glucose, capillary     Status: None   Collection Time: 11/15/23  8:07 AM  Result Value Ref Range   Glucose-Capillary 86 70 - 99 mg/dL    Comment: Glucose reference range applies only to samples taken after fasting for at least 8 hours.  Glucose, capillary     Status: None   Collection Time: 11/15/23 11:28 AM  Result Value Ref Range   Glucose-Capillary 94 70 - 99 mg/dL    Comment: Glucose reference range applies only to samples taken after fasting for at least 8 hours.  Glucose, capillary     Status: None   Collection Time: 11/15/23  4:59 PM  Result Value Ref Range   Glucose-Capillary 81 70 - 99 mg/dL    Comment: Glucose reference range applies only to samples taken after fasting for at least 8 hours.  Glucose, capillary     Status: None   Collection Time: 11/15/23  8:12 PM  Result Value Ref Range   Glucose-Capillary 85 70 - 99 mg/dL    Comment: Glucose reference range applies only to samples taken after fasting for at least 8 hours.  Magnesium      Status: None   Collection Time: 11/16/23  4:50 AM  Result Value Ref Range   Magnesium  1.9 1.7 - 2.4 mg/dL    Comment: Performed at [facility], [address], [place], [place] [address]  Phosphorus     Status: None   Collection Time: 11/16/23  4:50 AM  Result Value Ref Range   Phosphorus 2.5 2.5 - 4.6 mg/dL    Comment: Performed at [facility], [address], [place], [place] [address]  Basic metabolic panel with GFR     Status: Abnormal   Collection Time: 11/16/23  4:50 AM  Result Value Ref Range   Sodium 140 135 - 145 mmol/L   Potassium 3.8 3.5 - 5.1 mmol/L   Chloride 111 98 - 111 mmol/L   CO2 18 (L) 22 - 32 mmol/L   Glucose, Bld 78 70 - 99 mg/dL    Comment: Glucose reference range applies only to samples taken after fasting for at least 8 hours.   BUN 7 (L) 8 - 23 mg/dL   Creatinine, Ser 9.26 0.44 - 1.00 mg/dL   Calcium  6.9 (L)  8.9 - 10.3 mg/dL   GFR, Estimated >39 >39 mL/min    Comment: (NOTE) Calculated using the CKD-EPI Creatinine Equation (2021)    Anion gap 11 5 - 15    Comment: Performed at [facility], [address], [place], [place] [address]  Glucose, capillary     Status: None   Collection Time: 11/16/23  8:37 AM  Result Value Ref Range   Glucose-Capillary 77 70 - 99 mg/dL    Comment: Glucose reference range applies only to samples taken after fasting for at least 8 hours.  Glucose, capillary     Status: None   Collection Time: 11/16/23 11:10 AM  Result Value Ref Range   Glucose-Capillary 77 70 - 99 mg/dL    Comment: Glucose reference range applies only to samples taken after fasting for at least 8 hours.     No results found. Review of Systems  Constitutional:  Positive for fatigue and unexpected weight change. Negative for activity change, appetite change, chills, diaphoresis and fever.  HENT:  Positive for trouble swallowing.   Eyes: Negative.   Respiratory: Negative.    Cardiovascular: Negative.   Gastrointestinal:  Negative for abdominal distention, abdominal pain, anal bleeding, blood in stool, constipation, diarrhea, nausea, rectal pain and vomiting.  Endocrine: Negative.   Genitourinary: Negative.   Musculoskeletal:  Positive for arthralgias and myalgias.  Allergic/Immunologic: Negative.   Neurological:  Positive for dizziness and weakness.  Psychiatric/Behavioral: Negative.     Blood pressure 119/84, pulse 85, temperature 98.3 F (36.8 C), resp. rate 19, height 5' 4 (  1.626 m), weight 77.8 kg, SpO2 98%. Physical Exam Constitutional:      General: She is not in acute distress.    Appearance: She is not ill-appearing.  HENT:     Head: Normocephalic and atraumatic.     Comments: Patient has poor dental hygiene with multiple missing teeth both in the upper and lower jaw    Mouth/Throat:     Mouth: Mucous membranes are moist.  Eyes:     General: Lids are normal.   Cardiovascular:     Rate and Rhythm: Normal rate and regular rhythm.  Pulmonary:     Effort: Pulmonary effort is normal.     Breath sounds: Normal breath sounds and air entry.  Abdominal:     General: There is no distension.     Palpations: Abdomen is soft.     Tenderness: There is no abdominal tenderness.  Musculoskeletal:     Cervical back: Normal range of motion and neck supple.  Skin:    General: Skin is warm and dry.  Neurological:     General: No focal deficit present.     Mental Status: She is alert.  Psychiatric:        Attention and Perception: Attention normal.        Mood and Affect: Mood and affect normal.        Speech: Speech normal.        Behavior: Behavior normal.        Cognition and Memory: Cognition and memory normal.   Assessment/Plan: 1) GERD/Dysphagia for the last 4 months with poor dentition and esophageal dysmotility noted on barium swallow; patient is tolerating soft foods well and is scheduled to have a complete workup done by her gastroenterologist at [facility] in December. Continue PPI's and a soft diet for now.  I think patient would benefit from having her teeth fixed with partials or dentures what ever she may require to help with mastication and swallowing as well. I do not see any need for acute intervention from a GI standpoint at this point. 2) Hypokalemia and hypomagnesemia week weakness dizziness-being replenished  3) AODM. 4) Ascending aortic aneurysm. 5) Peripheral arterial disease. 6) History of PE on Eliquis . 7) Vertigo with dizziness.   Renaye Sous 11/16/2023, 3:17 PM

## 2023-11-16 NOTE — Evaluation (Signed)
 Clinical/Bedside Swallow Evaluation Patient Details  Name: Angie Carr MRN: [id number] Date of Birth: 07/31/1955  Today's Date: 11/16/2023 Time: SLP Start Time (ACUTE ONLY): 1400 SLP Stop Time (ACUTE ONLY): 1418 SLP Time Calculation (min) (ACUTE ONLY): 18 min  Past Medical History:  Past Medical History:  Diagnosis Date   Anemia    Anxiety    Asthma    pt denies   Diabetes mellitus without complication (HCC)    DJD (degenerative joint disease)    lower back   GERD (gastroesophageal reflux disease)    Headache    miagraines, none in years   Hypertension    LBP (low back pain)    Morbid obesity (HCC)    Neuromuscular disorder (HCC)    feet tingle and her balance is sometimes a problem   Primary osteoarthritis of both knees    Smoker    Past Surgical History:  Past Surgical History:  Procedure Laterality Date   ABDOMINAL HYSTERECTOMY     complete   CHOLECYSTECTOMY     laparascopic   COLONOSCOPY N/A 02/11/2014   Procedure: COLONOSCOPY;  Surgeon: Lamar Donnald GAILS, MD;  Location: [facility];  Service: Endoscopy;  Laterality: N/A;   HERNIA REPAIR     ventral on left side.   TOTAL HIP ARTHROPLASTY Left 07/24/2023   Procedure: ARTHROPLASTY, HIP, TOTAL,POSTERIOR APPROACH;  Surgeon: Edna Toribio LABOR, MD;  Location: [facility];  Service: Orthopedics;  Laterality: Left;   TUBAL LIGATION     HPI:  Patient is a 68 y.o. female with PMH: HTN, DM-2, PE, PAD, GERD, arthritis, anemia. She had a barium esophagram 09/18/23 which indicated esophageal dysmotility. She presented to the hospital on 11/13/23 with c/o dizziness and weakness. She was recently hospitalized in September and found to have hypokalemia/hypomagnesmia. CTA chest study negative for PE but showed 4.1 cm ascending aortic aneurysm. She was admitted for severe deficiency of electrolytes. GI and SLP consulted for persistent dysphagia, poor oral intake.   Patient reported to SLP that she has been drinking only liquids for  the past 3-4 months and she has lost approximately 60 lbs during this time. She indicated that she will sometimes see intact pills such as Potassium in her stool. She also indicates globus sensation of liquids, sensation of liquids or solids coming back up.    Assessment / Plan / Recommendation  Clinical Impression  As per chart review, patient report and SLP clinical observations with her PO intake of thin liquids, patient is exhibing a primary esophageal dysphagia but with questionable pharyngeal component. She did report that she has difficulty with solid texture POs in her throat/pharynx but she denies any immediate coughing or throat clearing when eating or drinking. She endorses reflux with both liquids and solids. SLP discussed potential benefit from a modified barium swallow study to assess her pharyngeal phase of swallow but will defer this until after GI has seen her as she does have a documented esophageal dysphagia as per esophagram form 09/18/23 which reported esophageal dysmotility. SLP Visit Diagnosis: Dysphagia, unspecified (R13.10)    Aspiration Risk  Mild aspiration risk    Diet Recommendation Regular;Thin liquid    Liquid Administration via: Cup;Straw Medication Administration: Other (Comment) (as tolerated) Supervision: Patient able to self feed Compensations: Small sips/bites;Slow rate Postural Changes: Seated upright at 90 degrees;Remain upright for at least 30 minutes after po intake    Other  Recommendations Oral Care Recommendations: Oral care BID     Assistance Recommended at Discharge    Functional  Status Assessment Patient has had a recent decline in their functional status and demonstrates the ability to make significant improvements in function in a reasonable and predictable amount of time.  Frequency and Duration min 1 x/week  1 week       Prognosis Prognosis for improved oropharyngeal function: Good      Swallow Study   General Date of Onset:  11/16/23 HPI: Patient is a 68 y.o. female with PMH: HTN, DM-2, PE, PAD, GERD, arthritis, anemia. She had a barium esophagram 09/18/23 which indicated esophageal dysmotility. She presented to the hospital on 11/13/23 with c/o dizziness and weakness. She was recently hospitalized in September and found to have hypokalemia/hypomagnesmia. CTA chest study negative for PE but showed 4.1 cm ascending aortic aneurysm. She was admitted for severe deficiency of electrolytes. GI and SLP consulted for persistent dysphagia, poor oral intake.   Patient reported to SLP that she has been drinking only liquids for the past 3-4 months and she has lost approximately 60 lbs during this time. She indicated that she will sometimes see intact pills such as Potassium in her stool. She also indicates globus sensation of liquids, sensation of liquids or solids coming back up. Type of Study: Bedside Swallow Evaluation Previous Swallow Assessment: none found Diet Prior to this Study: Regular;Thin liquids (Level 0) Temperature Spikes Noted: No Respiratory Status: Room air History of Recent Intubation: No Behavior/Cognition: Alert;Cooperative;Pleasant mood Oral Cavity Assessment: Within Functional Limits Oral Care Completed by SLP: No Oral Cavity - Dentition: Missing dentition Vision: Functional for self-feeding Self-Feeding Abilities: Able to feed self Patient Positioning: Upright in bed Baseline Vocal Quality: Normal Volitional Cough: Strong Volitional Swallow: Able to elicit    Oral/Motor/Sensory Function Overall Oral Motor/Sensory Function: Within functional limits   Ice Chips     Thin Liquid Thin Liquid: Within functional limits Presentation: Straw;Self Fed    Nectar Thick     Honey Thick     Puree Puree: Not tested   Solid     Solid: Not tested     Norleen IVAR Blase, MA, CCC-SLP Speech Therapy

## 2023-11-17 DIAGNOSIS — E876 Hypokalemia: Secondary | ICD-10-CM | POA: Diagnosis not present

## 2023-11-17 LAB — GLUCOSE, CAPILLARY
Glucose-Capillary: 74 mg/dL (ref 70–99)
Glucose-Capillary: 71 mg/dL (ref 70–99)
Glucose-Capillary: 73 mg/dL (ref 70–99)
Glucose-Capillary: 75 mg/dL (ref 70–99)

## 2023-11-17 NOTE — Progress Notes (Signed)
 PROGRESS NOTE  Angie Carr  FMW:[id number] DOB: 26-Dec-1955 DOA: 11/13/2023 PCP: Benjamine Aland, MD   Brief Narrative: Patient is a 68 year old female with history of hypertension, diabetes type 2, PE on Eliquis , peripheral artery disease, GERD, arthritis, anemia who presented with complaints of dizziness, weakness.  She was recently hospitalized in September when she was found to have hypokalemia/hypomagnesemia.  She was taking supplementation at home but recently discontinued by her PCP.  On presentation, she was hemodynamically stable.  Lab work showed potassium of 2.4, magnesium  of 0.5.  EKG showed normal sinus rhythm with occasional PVCs/PACs.  CTA chest study negative for PE but showed 4.1 cm ascending aortic aneurysm.  Patient admitted for the management of severe deficiency of electrolytes.  Currently being monitored and supplemented as needed.  PT recommended SNF on discharge.  GI/speech therapy consulted for persistent dysphagia, poor oral intake.  No plan for inpatient workup.  Assessment & Plan:  Principal Problem:   Hypokalemia Active Problems:   Abdominal aortic aneurysm (AAA) >39 mm diameter   Gastroesophageal reflux disease   Generalized weakness   Dizziness   History of pulmonary embolism   Hypokalemia/hypomagnesemia: Presented with weakness, dizziness.  Severely deficient in electrolytes.  Continue monitoring and supplementation.  Denies any nausea, vomiting or diarrhea currently.  Abdomen benign on examination  Poor oral intake/dysphagia: Patient has poor appetite, choking history with food.  Has not have significant oral intake since last 3 to 4 months.  She seen by [facility] on September for the same.  She has been following with [facility] and the plan is for colonoscopy/EGD next week.  As per patient and family request, we consulted GI here.  Also consulted speech therapy, dietitian.  She has poor dentition, esophageal dysmotility noted on barium swallow  but she is tolerating solid foods well.  GI recommend to continue PPI, soft diet for now.  GI did not plan for any acute intervention.  Started on dysphagia 3 diet.  Vertigo: Complains of positional vertigo with room spinning around her.  Noticed to have nystagmus as well.  Likely BPPV. status post Epley's maneuver .PT recommending SNF on discharge.  Started on meclizine.  No focal deficits .so MRI deferred. vertigo has resolved  Type 2 diabetes: Well-controlled.  Recent A1c of 4.8.  Recently taken off from metformin  by her PCP.  Continue sliding scale  Generalized weakness/dizziness/deconditioning: PT requested.  Orthostatic vitals negative.  Recommended SNF on discharge.  TOC consulted.  Lives with family.  Ambulates with a walker  History of PE: On Eliquis   Ascending aortic aneurysm: CTA chest showed 4.1 cm ascending aortic aneurysm.  Needs annual follow-up imaging by CTA or MRA.  She needs to follow-up with  vascular surgery as an outpatient on discharge.  History of peripheral artery disease: Continue cilostazol   GERD: Continue PPI  Anxiety: On mirtazipine  Constipation: Continue bowel regimen     Wound 11/13/23 2300 Pressure Injury Coccyx Stage 2 -  Partial thickness loss of dermis presenting as a shallow open injury with a red, pink wound bed without slough. (Active)    DVT prophylaxis: apixaban  (ELIQUIS ) tablet 5 mg     Code Status: Full Code  Family Communication: Sister at bedside on 11/15  Patient status:Inpatient  Patient is from :Home  Anticipated discharge to:SNF  Estimated DC date:whenever possible   Consultants: None  Procedures:None  Antimicrobials:  Anti-infectives (From admission, onward)    None       Subjective: Patient seen and examined at  bedside today.  Hemodynamically stable.  Feels better today.  She states she vomited once this morning.  Otherwise she denies any dizziness, abdominal pain reevaluation.  She says she is trying to eat  more.  Objective: Vitals:   11/16/23 1951 11/16/23 1953 11/17/23 0423 11/17/23 0741  BP: 134/74 134/74 (!) 140/82 (!) 156/100  Pulse: 92 86 81 (!) 106  Resp:   18   Temp:  98.4 F (36.9 C) 98.7 F (37.1 C)   TempSrc:  Oral Oral Oral  SpO2: 100% 100% 100% 99%  Weight:      Height:        Intake/Output Summary (Last 24 hours) at 11/17/2023 1101 Last data filed at 11/17/2023 1037 Gross per 24 hour  Intake 667.74 ml  Output 680 ml  Net -12.26 ml   Filed Weights   11/13/23 1627 11/14/23 0113  Weight: 74.4 kg 77.8 kg    Examination:   General exam: Overall comfortable, not in distress HEENT: PERRL, poor dentition Respiratory system:  no wheezes or crackles  Cardiovascular system: S1 & S2 heard, RRR.  Gastrointestinal system: Abdomen is nondistended, soft and nontender. Central nervous system: Alert and oriented Extremities: No edema, no clubbing ,no cyanosis Skin: No rashes, no ulcers,no icterus     Data Reviewed: I have personally reviewed following labs and imaging studies  CBC: Recent Labs  Lab 11/13/23 1626 11/13/23 1807 11/14/23 0407  WBC 5.1  --  4.6  HGB 11.5* 11.9* 9.3*  HCT 34.0* 35.0* 26.4*  MCV 95.5  --  92.3  PLT 164  --  133*   Basic Metabolic Panel: Recent Labs  Lab 11/13/23 1626 11/13/23 1749 11/13/23 1807 11/14/23 0407 11/15/23 0414 11/16/23 0450  NA 144  --  144 142 138 140  K 3.3*  --  2.4* 4.2 3.8 3.8  CL 107  --  108 114* 111 111  CO2 15*  --   --  14* 18* 18*  GLUCOSE 81  --  80 70 77 78  BUN 15  --  14 13 9  7*  CREATININE 1.26*  --  0.80 0.99 0.73 0.73  CALCIUM  6.9*  --   --  6.4* 6.6* 6.9*  MG  --  0.5*  --  1.2* 1.3* 1.9  PHOS  --   --   --  1.3* 2.6 2.5     Recent Results (from the past 240 hours)  Resp panel by RT-PCR (RSV, Flu A&B, Covid) Anterior Nasal Swab     Status: None   Collection Time: 11/13/23  6:05 PM   Specimen: Anterior Nasal Swab  Result Value Ref Range Status   SARS Coronavirus 2 by RT PCR NEGATIVE  NEGATIVE Final   Influenza A by PCR NEGATIVE NEGATIVE Final   Influenza B by PCR NEGATIVE NEGATIVE Final    Comment: (NOTE) The Xpert Xpress SARS-CoV-2/FLU/RSV plus assay is intended as an aid in the diagnosis of influenza from Nasopharyngeal swab specimens and should not be used as a sole basis for treatment. Nasal washings and aspirates are unacceptable for Xpert Xpress SARS-CoV-2/FLU/RSV testing.  Fact Sheet for Patients: bloggercourse.com  Fact Sheet for Healthcare Providers: seriousbroker.it  This test is not yet approved or cleared by the [place]  FDA and has been authorized for detection and/or diagnosis of SARS-CoV-2 by FDA under an Emergency Use Authorization (EUA). This EUA will remain in effect (meaning this test can be used) for the duration of the COVID-19 declaration under Section 564(b)(1) of the Act,  21 U.S.C. section 360bbb-3(b)(1), unless the authorization is terminated or revoked.     Resp Syncytial Virus by PCR NEGATIVE NEGATIVE Final    Comment: (NOTE) Fact Sheet for Patients: bloggercourse.com  Fact Sheet for Healthcare Providers: seriousbroker.it  This test is not yet approved or cleared by the [place]  FDA and has been authorized for detection and/or diagnosis of SARS-CoV-2 by FDA under an Emergency Use Authorization (EUA). This EUA will remain in effect (meaning this test can be used) for the duration of the COVID-19 declaration under Section 564(b)(1) of the Act, 21 U.S.C. section 360bbb-3(b)(1), unless the authorization is terminated or revoked.  Performed at [facility], [address], [place], [place] [address]      Radiology Studies: No results found.   Scheduled Meds:  apixaban   5 mg Oral BID   bisacodyl  10 mg Rectal Once   cilostazol   50 mg Oral BID   insulin  aspart  0-9 Units Subcutaneous TID WC   mirtazapine   7.5  mg Oral Daily   pantoprazole   40 mg Oral Daily   polyethylene glycol  17 g Oral Daily   senna-docusate  1 tablet Oral BID   Continuous Infusions:     LOS: 3 days   Ivonne Mustache, MD [facility] P11/16/2025, 11:01 AM

## 2023-11-17 NOTE — Plan of Care (Signed)
 N/V dizziness limiting her activity. Minimal PO intake thus far today.   Problem: Education: Goal: Ability to describe self-care measures that may prevent or decrease complications (Diabetes Survival Skills Education) will improve Outcome: Progressing Goal: Individualized Educational Video(s) Outcome: Progressing   Problem: Coping: Goal: Ability to adjust to condition or change in health will improve Outcome: Progressing   Problem: Fluid Volume: Goal: Ability to maintain a balanced intake and output will improve Outcome: Not Progressing   Problem: Metabolic: Goal: Ability to maintain appropriate glucose levels will improve Outcome: Progressing   Problem: Nutritional: Goal: Maintenance of adequate nutrition will improve Outcome: Not Progressing Goal: Progress toward achieving an optimal weight will improve Outcome: Progressing   Problem: Tissue Perfusion: Goal: Adequacy of tissue perfusion will improve Outcome: Progressing   Problem: Education: Goal: Knowledge of General Education information will improve Description: Including pain rating scale, medication(s)/side effects and non-pharmacologic comfort measures Outcome: Progressing   Problem: Health Behavior/Discharge Planning: Goal: Ability to manage health-related needs will improve Outcome: Progressing   Problem: Nutrition: Goal: Adequate nutrition will be maintained Outcome: Not Progressing   Problem: Activity: Goal: Risk for activity intolerance will decrease Outcome: Not Progressing   Problem: Clinical Measurements: Goal: Ability to maintain clinical measurements within normal limits will improve Outcome: Progressing Goal: Will remain free from infection Outcome: Progressing Goal: Diagnostic test results will improve Outcome: Progressing Goal: Respiratory complications will improve Outcome: Progressing Goal: Cardiovascular complication will be avoided Outcome: Progressing   Problem: Pain  Managment: Goal: General experience of comfort will improve and/or be controlled Outcome: Progressing   Problem: Safety: Goal: Ability to remain free from injury will improve Outcome: Progressing

## 2023-11-17 NOTE — Plan of Care (Signed)
  Problem: Fluid Volume: Goal: Ability to maintain a balanced intake and output will improve Outcome: Progressing   Problem: Metabolic: Goal: Ability to maintain appropriate glucose levels will improve Outcome: Progressing   

## 2023-11-18 ENCOUNTER — Inpatient Hospital Stay (HOSPITAL_COMMUNITY)

## 2023-11-18 DIAGNOSIS — E43 Unspecified severe protein-calorie malnutrition: Secondary | ICD-10-CM | POA: Insufficient documentation

## 2023-11-18 DIAGNOSIS — R933 Abnormal findings on diagnostic imaging of other parts of digestive tract: Secondary | ICD-10-CM | POA: Diagnosis not present

## 2023-11-18 DIAGNOSIS — E876 Hypokalemia: Secondary | ICD-10-CM | POA: Diagnosis not present

## 2023-11-18 DIAGNOSIS — R1319 Other dysphagia: Secondary | ICD-10-CM

## 2023-11-18 DIAGNOSIS — K219 Gastro-esophageal reflux disease without esophagitis: Secondary | ICD-10-CM | POA: Diagnosis not present

## 2023-11-18 LAB — BASIC METABOLIC PANEL WITH GFR
Anion gap: 11 (ref 5–15)
BUN: 13 mg/dL (ref 8–23)
CO2: 20 mmol/L — ABNORMAL LOW (ref 22–32)
Calcium: 7.5 mg/dL — ABNORMAL LOW (ref 8.9–10.3)
Chloride: 114 mmol/L — ABNORMAL HIGH (ref 98–111)
Creatinine, Ser: 0.78 mg/dL (ref 0.44–1.00)
GFR, Estimated: >60 mL/min (ref >60)
Glucose, Bld: 76 mg/dL (ref 70–99)
Potassium: 4.5 mmol/L (ref 3.5–5.1)
Sodium: 145 mmol/L (ref 135–145)

## 2023-11-18 LAB — GLUCOSE, CAPILLARY
Glucose-Capillary: 79 mg/dL (ref 70–99)
Glucose-Capillary: 81 mg/dL (ref 70–99)
Glucose-Capillary: 71 mg/dL (ref 70–99)

## 2023-11-18 LAB — PHOSPHORUS: Phosphorus: 2.4 mg/dL — ABNORMAL LOW (ref 2.5–4.6)

## 2023-11-18 LAB — MAGNESIUM: Magnesium: 1.7 mg/dL (ref 1.7–2.4)

## 2023-11-18 MED ORDER — ADULT MULTIVITAMIN W/MINERALS CH
1.0000 | ORAL_TABLET | Freq: Every day | ORAL | Status: DC
  Administered 2023-11-18 – 2023-11-21 (×4): 1 via ORAL
  Filled 2023-11-18 (×4): qty 1

## 2023-11-18 MED ORDER — THIAMINE MONONITRATE 100 MG PO TABS
100.0000 mg | ORAL_TABLET | Freq: Every day | ORAL | Status: DC
  Administered 2023-11-18 – 2023-11-21 (×4): 100 mg via ORAL
  Filled 2023-11-18 (×4): qty 1

## 2023-11-18 MED ORDER — SODIUM CHLORIDE 0.9 % IV SOLN
12.5000 mg | Freq: Four times a day (QID) | INTRAVENOUS | Status: DC | PRN
  Administered 2023-11-18: 12.5 mg via INTRAVENOUS
  Filled 2023-11-18 (×2): qty 0.5
  Filled 2023-11-18: qty 12.5

## 2023-11-18 MED ORDER — K PHOS MONO-SOD PHOS DI & MONO 155-852-130 MG PO TABS
500.0000 mg | ORAL_TABLET | Freq: Three times a day (TID) | ORAL | Status: DC
  Administered 2023-11-18 – 2023-11-19 (×6): 500 mg via ORAL
  Filled 2023-11-18 (×6): qty 2

## 2023-11-18 MED ORDER — JUVEN PO PACK
1.0000 | PACK | Freq: Two times a day (BID) | ORAL | Status: DC
  Administered 2023-11-18: 1 via ORAL
  Filled 2023-11-18 (×4): qty 1

## 2023-11-18 MED ORDER — MAGNESIUM OXIDE -MG SUPPLEMENT 400 (240 MG) MG PO TABS
800.0000 mg | ORAL_TABLET | Freq: Every day | ORAL | Status: DC
  Administered 2023-11-18: 800 mg via ORAL
  Filled 2023-11-18: qty 2

## 2023-11-18 NOTE — Progress Notes (Addendum)
 Initial Nutrition Assessment  DOCUMENTATION CODES:   Severe malnutrition in context of chronic illness  INTERVENTION:  1 packet Juven BID to support wound healing. Each packet provides 95 calories, 2.5 grams of protein (collagen), and 9.8 grams of carbohydrate (3 grams sugar); also contains 7 grams of L-arginine and L-glutamine, 300 mg vitamin C, 15 mg vitamin E, 1.2 mcg vitamin B-12, 9.5 mg zinc , 200 mg calcium , and 1.5 g Calcium  Beta-hydroxy-Beta-methylbutyrate. Patient refusing any other ONS. The patient is at risk for refeeding syndrome. Provide 100 mg Thiamine  PO daily for 7 days. Provide Multivitamin PO daily. Monitor magnesium , potassium, and phosphorus daily for at least 3 days, with replacement per protocol. Labs ordered. Continue mirtazapine . Continue Dysphagia 3 diet with thin liquids per SLP recs.   NUTRITION DIAGNOSIS:   Severe Malnutrition related to chronic illness as evidenced by energy intake < or equal to 75% for > or equal to 1 month, percent weight loss, moderate muscle depletion.  GOAL:   Patient will meet greater than or equal to 90% of their needs   MONITOR:   PO intake, Supplement acceptance, Labs, Weight trends  REASON FOR ASSESSMENT:   Consult Assessment of nutrition requirement/status  ASSESSMENT:   Patient presented with weakness/dizziness, dysphagia with poor appetite X 3-4 months and choking on food, and was found to have hypokalemia and abdominal aortic aneurysm. PMH significant for multiple dental extractions 4 months ago, esophageal dysmotility s/p barium swallow 09/18/23, HTN, DM2, PE, PAD, GERD, and arthritis.  SLP recommends Dysphagia 3/thin diet. Visited the patient who states that she has not been eating much for the past few months. When she does eat, she would just have chicken broth and maybe bites of shrimp with broccoli Chinese food. She tells me she just does not feel like eating and denies any physical symptoms preventing her from  eating. She dislikes ONS because they are too sweet and declined any ONS I listed and refuses any suggestions to dilute them. D/W RN at bedside who questions need for psych eval which was deferred by provider. The patient tells me the Remeron  has not helped her appetite. She c/o nausea and dry heaving and tells me she last had a small emesis last night.  Scheduled Meds:  apixaban   5 mg Oral BID   bisacodyl  10 mg Rectal Once   cilostazol   50 mg Oral BID   insulin  aspart  0-9 Units Subcutaneous TID WC   mirtazapine   7.5 mg Oral Daily   pantoprazole   40 mg Oral Daily   phosphorus  500 mg Oral TID   polyethylene glycol  17 g Oral Daily   senna-docusate  1 tablet Oral BID  K Phos  500 mg TID Potassium chloride  40 mEq last given 11/15 1752 Magnesium  sulfate 4 g IV last given 11/14 0855  Continuous Infusions:  promethazine (PHENERGAN) injection (IM or IVPB) 12.5 mg (11/18/23 1123)    Diet Order             DIET DYS 3 Room service appropriate? Yes; Fluid consistency: Thin  Diet effective now                  Meal Intake: 0%  Labs:     Latest Ref Rng & Units 11/18/2023    4:34 AM 11/16/2023    4:50 AM 11/15/2023    4:14 AM  CMP  Glucose 70 - 99 mg/dL 76  78  77   BUN 8 - 23 mg/dL 13  7  9  Creatinine 0.44 - 1.00 mg/dL 9.21  9.26  9.26   Sodium 135 - 145 mmol/L 145  140  138   Potassium 3.5 - 5.1 mmol/L 4.5  3.8  3.8   Chloride 98 - 111 mmol/L 114  111  111   CO2 22 - 32 mmol/L 20  18  18    Calcium  8.9 - 10.3 mg/dL 7.5  6.9  6.6       I/O: +600 mL since admit  NUTRITION - FOCUSED PHYSICAL EXAM:  Flowsheet Row Most Recent Value  Orbital Region No depletion  Upper Arm Region No depletion  Thoracic and Lumbar Region No depletion  Buccal Region Moderate depletion  Temple Region Moderate depletion  Clavicle Bone Region Mild depletion  Clavicle and Acromion Bone Region Mild depletion  Scapular Bone Region Mild depletion  Dorsal Hand Moderate depletion  Patellar  Region No depletion  Anterior Thigh Region No depletion  Posterior Calf Region Unable to assess  [patient refused]  Edema (RD Assessment) Mild  Hair Reviewed  Eyes Reviewed  Mouth Other (Comment)  [only 7 teeth left per patient]  Skin Reviewed  Nails Reviewed    EDUCATION NEEDS:   Education needs have been addressed  Skin:  Skin Assessment: Skin Integrity Issues: Skin Integrity Issues:: Stage II Stage II: coccyx  Last BM:  11/16 type 7  Height:   Ht Readings from Last 1 Encounters:  11/13/23 5' 4 (1.626 m)    Weight:    Weight Change: 27 Kg (25%) loss in 3 months = severe  Usual Body Weight: 238 lbs 07/2023  Edema: +1 BLE  Ideal Body Weight:  55 kg   BMI:  Body mass index is 29.44 kg/m.  Estimated Nutritional Needs:  Kcal:  1800-2000 Protein:  95-115 Fluid:  >1800    Leverne Ruth, MS, RDN, LDN [place]. [facility] See AMION for contact information

## 2023-11-18 NOTE — Progress Notes (Addendum)
 Speech Language Pathology Treatment: Dysphagia  Angie Carr Details Name: Angie Carr MRN: [id number] DOB: 1955/08/22 Today's Date: 11/18/2023 Time: 8846-8785 SLP Time Calculation (min) (ACUTE ONLY): 21 min  Assessment / Plan / Recommendation Clinical Impression  Angie Carr consumed minimal PO trials including sips of thin liquids only. She self-limits due to fear of nausea. Oropharyngeal swallow appears to be functional with the few sips she takes, but then she starts to exhibit eructation, gagging, and attempts to cough/dry heave because she feels like the liquid wants to come back up. Note that oropharyngeal swallow was normal on esophagram in September, which was completed after she reports onset of difficulty swallowing (she noticed symptoms as early as August). Note that MRI was negative for acute findings. Angie Carr's symptoms are more concerning for an esophageal/GI component. Discussed options for further diet modifications - she believes that she can masticate her food thoroughly. She believes her biggest barrier is her nausea, and that she would prefer to have a wider range of options to see what does not make her feel sick.   PLAN: Angie Carr presents with what appears to be primary esophageal symptoms with known dysmotility. Education was given about strategies to use as well as options for softer diets. Softer diets would also lead to more restriction though, so per Angie Carr preference, will leave on mechanical soft diet and thin liquids. Would defer additional f/u to GI when they feel like she is appropriate for more testing.    HPI HPI: Angie Carr is a 68 y.o. female with PMH: HTN, DM-2, PE, PAD, GERD, arthritis, anemia. She had a barium esophagram 09/18/23 which indicated esophageal dysmotility. She presented to the hospital on 11/13/23 with c/o dizziness and weakness. She was recently hospitalized in September and found to have hypokalemia/hypomagnesmia. CTA chest study negative for PE but showed 4.1 cm ascending  aortic aneurysm. She was admitted for severe deficiency of electrolytes. GI and SLP consulted for persistent dysphagia, poor oral intake.   Angie Carr reported to SLP that she has been drinking only liquids for the past 3-4 months and she has lost approximately 60 lbs during this time. She indicated that she will sometimes see intact pills such as Potassium in her stool. She also indicates globus sensation of liquids, sensation of liquids or solids coming back up.      SLP Plan  All goals met          Recommendations  Diet recommendations: Dysphagia 3 (mechanical soft);Thin liquid Liquids provided via: Cup;Straw Medication Administration: Other (Comment) (as tolerated) Supervision: Angie Carr able to self feed;Intermittent supervision to cue for compensatory strategies Compensations: Small sips/bites;Slow rate;Follow solids with liquid Postural Changes and/or Swallow Maneuvers: Seated upright 90 degrees;Upright 30-60 min after meal                  Oral care BID     Dysphagia, unspecified (R13.10)     All goals met     Leita SAILOR., M.A. CCC-SLP Acute Rehabilitation Services Office: [phone]  Secure chat preferred   11/18/2023, 12:43 PM

## 2023-11-18 NOTE — Care Management Important Message (Signed)
 Important Message  Patient Details  Name: Angie Carr MRN: [id number] Date of Birth: January 28, 1955   Important Message Given:  Yes - Medicare IM     Claretta Deed 11/18/2023, 1:50 PM

## 2023-11-18 NOTE — Plan of Care (Signed)
   Problem: Coping: Goal: Ability to adjust to condition or change in health will improve Outcome: Progressing   Problem: Fluid Volume: Goal: Ability to maintain a balanced intake and output will improve Outcome: Progressing   Problem: Health Behavior/Discharge Planning: Goal: Ability to identify and utilize available resources and services will improve Outcome: Progressing

## 2023-11-18 NOTE — Progress Notes (Addendum)
 Patient ID: Angie Carr, female   DOB: April 09, 1955, 68 y.o.   MRN: [id number]    Progress Note   Subjective   Day # 4 CC; weakness, dysphagia, poor oral intake, weight loss  Asked by hospitalist to follow-up again today, patient had been seen by GI/Dr. Kristie over the weekend.  Patient says she had rather acute onset of dysphagia in August 2025 around the time that she had surgery for a hip replacement.  Prior to that had not had any issues.  She is currently having significant difficulty with liquids and solids and a sensation of food sitting in her esophagus for prolonged periods of time frequently requiring regurgitation.  She feels she has lost about 60 pounds since onset of symptoms.  MRI brain without contrast-no acute abnormality, mild chronic small vessel ischemic disease, right mastoid effusion CT abdomen and pelvis with contrast-esophagus appears normal fatty changes of the liver no ductal dilation, stable dilation of the main pulmonary artery compatible with pulmonary artery hypertension, 4.1 cm ascending aortic aneurysm recommend annual follow-up. Barium swallow 09/18/2023-disruption of primary peristaltic waves in the distal esophagus on multiple swallows favoring nonspecific esophageal dysmotility some resulting stasis of contrast in the distal esophagus no hiatal hernia    Objective   Vital signs in last 24 hours: Temp:  [98.4 F (36.9 C)-98.8 F (37.1 C)] 98.7 F (37.1 C) (11/17 1534) Pulse Rate:  [85-94] 94 (11/17 1534) Resp:  [18] 18 (11/17 1534) BP: (134-149)/(75-89) 143/82 (11/17 1534) SpO2:  [99 %-100 %] 100 % (11/17 1534) Last BM Date : 11/17/23 General:    Older African-American female in NAD Heart:  Regular rate and rhythm; no murmurs Lungs: Respirations even and unlabored, lungs CTA bilaterally Abdomen:  Soft, nontender and nondistended. Normal bowel sounds. Extremities:  Without edema. Neurologic:  Alert and oriented,  grossly normal  neurologically. Psych:  Cooperative. Normal mood and affect.  Intake/Output from previous day: 11/16 0701 - 11/17 0700 In: 400 [P.O.:400] Out: 180 [Urine:150; Emesis/NG output:30] Intake/Output this shift: Total I/O In: 50 [IV Piggyback:50] Out: -   Lab Results: No results for input(s): WBC, HGB, HCT, PLT in the last 72 hours. BMET Recent Labs    11/16/23 0450 11/18/23 0434  NA 140 145  K 3.8 4.5  CL 111 114*  CO2 18* 20*  GLUCOSE 78 76  BUN 7* 13  CREATININE 0.73 0.78  CALCIUM  6.9* 7.5*   LFT No results for input(s): PROT, ALBUMIN, AST, ALT, ALKPHOS, BILITOT, BILIDIR, IBILI in the last 72 hours. PT/INR No results for input(s): LABPROT, INR in the last 72 hours.  Studies/Results: MR BRAIN WO CONTRAST Result Date: 11/18/2023 EXAM: MRI BRAIN WITHOUT CONTRAST 11/18/2023 11:04:21 AM TECHNIQUE: Multiplanar multisequence MRI of the head/brain was performed without the administration of intravenous contrast. COMPARISON: None available. CLINICAL HISTORY: Syncope/presyncope, cerebrovascular cause suspected. FINDINGS: BRAIN AND VENTRICLES: There is no evidence of an acute infarct, intracranial hemorrhage, mass, midline shift, hydrocephalus, or extra-axial fluid collection. T2 hyperintensities in the cerebral white matter bilaterally are nonspecific but compatible with mild chronic small vessel ischemic disease. 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 There is moderately advanced cerebral and cerebellar atrophy. Major intracranial vascular flow voids are preserved. ORBITS: No acute abnormality. SINUSES AND MASTOIDS: Large right and trace left mastoid effusions. Clear paranasal sinuses. BONES AND SOFT TISSUES: Normal marrow signal. No acute soft tissue abnormality. IMPRESSION: 1. No acute intracranial abnormality. 2. Moderately advanced atrophy. 3. Mild chronic small vessel ischemic disease. 4. Large  right mastoid effusion.  Electronically signed by: Dasie Hamburg MD 11/18/2023 11:15 AM EST RP Workstation: [id number]       Assessment / Plan:    #61 68 year old female with 3-1/22-month history of dysphagia symptomatic with liquids and solids with sensation of food sitting in her esophagus for prolonged periods of time and episodes of regurgitation on a daily basis.  Associated weight loss/significant  Patient was admitted with weakness, and electrolyte derangements possibly secondary to poor oral intake.  Recent barium swallow does show what appears to be nonspecific dysmotility.  Rule out presbyesophagus versus other motility disorder, rule out subtle stricture not appreciated on barium swallow, rule out achalasia  # 2 anemia normocytic #3 electrolyte derangement corrected #4 diabetes mellitus #5.  History of PE postoperative hip replacement August 2025-on Eliquis  #6 arterial disease-on Pletal  #7 history of GERD-PPI  Plan; will plan for EGD with possible esophageal dilation with Dr. San for Wednesday, 11/20/2023.  Procedure was discussed in detail with the patient including indications risks and benefits and she is agreeable to proceed. Will need to hold Eliquis -have stopped for now Okay to continue Pletal   Patient apparently had also been planned for colonoscopy as an outpatient with [facility].  Given her significant dysphagia at present not a good candidate for bowel prep, and that can be completed as an outpatient  Soft diet, upright 90 degrees for meals and 1 hour postprandially  GI will follow along with you       Principal Problem:   Hypokalemia Active Problems:   Abdominal aortic aneurysm (AAA) >39 mm diameter   Gastroesophageal reflux disease   Generalized weakness   Dizziness   History of pulmonary embolism   Protein-calorie malnutrition, severe     LOS: 4 days   Amy Esterwood PA-C 11/18/2023, 3:35 PM    Attending physician's note   I have taken a history, reviewed the  chart, and examined the patient. I performed a substantive portion of this encounter, including complete performance of at least one of the key components, in conjunction with the APP. I agree with the APP's note, impression, and recommendations with my edits.   GI service asked to reconsult on this patient.  Essentially had new onset dysphagia in 08/2023 at started around the time of her hip surgery.  She does have a longstanding history of GERD, which was generally well-controlled with omeprazole , but no prior history of dysphagia.  Does not think she has had an EGD in the past.  Endorses trouble with solids and liquids along with pills.  Had barium swallow on 9/17 that showed nonspecific esophageal dysmotility with esophageal stasis.  - Plan for EGD on this admission for diagnostic and potentially therapeutic intent with esophageal dilation and/or biopsies as appropriate - Hold Eliquis  - Plan for EGD on Wednesday, 11/19 after Eliquis  washout - Will intentionally continue Pletal  through perioperative period - Resume PPI - Additional recommendations pending endoscopic findings  A total of 50 minutes of time was spent on this encounter, including in depth chart review, independent review of results as outlined above, communicating results with the patient and family members at bedside directly, face-to-face time with the patient, coordinating care, and ordering studies and medications as appropriate, and documentation.   [address] DO, FACG [phone] office

## 2023-11-18 NOTE — Progress Notes (Signed)
 PROGRESS NOTE  NIKIESHA MILFORD  FMW:[id number] DOB: 06/04/1955 DOA: 11/13/2023 PCP: Benjamine Aland, MD   Brief Narrative: Patient is a 68 year old female with history of hypertension, diabetes type 2, PE on Eliquis , peripheral artery disease, GERD, arthritis, anemia who presented with complaints of dizziness, weakness.  She was recently hospitalized in September when she was found to have hypokalemia/hypomagnesemia.  She was taking supplementation at home but recently discontinued by her PCP.  On presentation, she was hemodynamically stable.  Lab work showed potassium of 2.4, magnesium  of 0.5.  EKG showed normal sinus rhythm with occasional PVCs/PACs.  CTA chest study negative for PE but showed 4.1 cm ascending aortic aneurysm.  Patient admitted for the management of severe deficiency of electrolytes.  Currently being monitored and supplemented as needed.  PT recommended SNF on discharge.  GI/speech therapy consulted for persistent dysphagia, poor oral intake.  TOC following for SNF bed  Assessment & Plan:  Principal Problem:   Hypokalemia Active Problems:   Abdominal aortic aneurysm (AAA) >39 mm diameter   Gastroesophageal reflux disease   Generalized weakness   Dizziness   History of pulmonary embolism   Hypokalemia/hypomagnesemia/hypophosphatemia: Presented with weakness, dizziness.  Severely deficient in electrolytes.  This is from poor oral intake that has been going on for several months.  Continue monitoring and supplementation.  Denies any nausea, vomiting or diarrhea currently.  Abdomen benign on examination  Poor oral intake/dysphagia: Patient has poor appetite, choking history with food.  Has not have significant oral intake since last 3 to 4 months.  She seen by [facility] on September for the same.  She has been following with [facility] and the plan is for colonoscopy/EGD next week.  As per patient and family request, we consulted GI here.  Also consulted speech  therapy, dietitian.  She has poor dentition, esophageal dysmotility noted on barium swallow but she is tolerating solid foods well.  GI recommend to continue PPI, soft diet for now.  GI did not plan for any acute intervention.  Speech therapy following.  Dietitian following.  Started on dysphagia 3 diet.  I messaged [facility] again today if there is any consideration for EGD here.  Vertigo: Complains of positional vertigo with room spinning around her.  Noticed to have nystagmus as well.  Likely BPPV. status post Epley's maneuver .PT recommending SNF on discharge.  Started on meclizine.  No focal deficits .MRI did not show any acute findings.  Type 2 diabetes: Well-controlled.  Recent A1c of 4.8.  Recently taken off from metformin  by her PCP.  Continue sliding scale  Generalized weakness/dizziness/deconditioning: PT requested.  Orthostatic vitals negative.  Recommended SNF on discharge.  TOC consulted.  Lives with family.  Ambulates with a walker  History of PE: On Eliquis   Ascending aortic aneurysm: CTA chest showed 4.1 cm ascending aortic aneurysm.  Needs annual follow-up imaging by CTA or MRA.  She needs to follow-up with  vascular surgery as an outpatient on discharge.  History of peripheral artery disease: Continue cilostazol   GERD: Continue PPI  Anxiety: On mirtazipine  Constipation: Continue bowel regimen   Nutrition Problem: Severe Malnutrition Etiology: chronic illness Wound 11/13/23 2300 Pressure Injury Coccyx Stage 2 -  Partial thickness loss of dermis presenting as a shallow open injury with a red, pink wound bed without slough. (Active)    DVT prophylaxis: apixaban  (ELIQUIS ) tablet 5 mg     Code Status: Full Code  Family Communication: Sister at bedside on 11/15  Patient status:Inpatient  Patient is from :Home  Anticipated discharge to:SNF  Estimated DC date:whenever possible   Consultants: None  Procedures:None  Antimicrobials:  Anti-infectives (From  admission, onward)    None       Subjective: Patient seen and examined at bedside today.  Denied dizziness or abdominal pain today.  Had an episode of vomiting this morning , had nausea.  Continues to have poor oral intake.  Electrolytes have been more stable since last few days.  Objective: Vitals:   11/17/23 1644 11/17/23 2102 11/18/23 0547 11/18/23 0929  BP: (!) 141/80 134/80 (!) 149/75 139/89  Pulse: 89 85 85 94  Resp: 18 18 18 18   Temp: 98.8 F (37.1 C) 98.4 F (36.9 C) 98.5 F (36.9 C) 98.5 F (36.9 C)  TempSrc: Oral Oral    SpO2: 100% 100% 100% 99%  Weight:      Height:       No intake or output data in the 24 hours ending 11/18/23 1323  Filed Weights   11/13/23 1627 11/14/23 0113  Weight: 74.4 kg 77.8 kg    Examination:   General exam: Overall comfortable, not in distress HEENT: PERRL,poor dentition Respiratory system:  no wheezes or crackles  Cardiovascular system: S1 & S2 heard, RRR.  Gastrointestinal system: Abdomen is nondistended, soft and nontender. Central nervous system: Alert and oriented Extremities: No edema, no clubbing ,no cyanosis Skin: No rashes, no ulcers,no icterus       Data Reviewed: I have personally reviewed following labs and imaging studies  CBC: Recent Labs  Lab 11/13/23 1626 11/13/23 1807 11/14/23 0407  WBC 5.1  --  4.6  HGB 11.5* 11.9* 9.3*  HCT 34.0* 35.0* 26.4*  MCV 95.5  --  92.3  PLT 164  --  133*   Basic Metabolic Panel: Recent Labs  Lab 11/13/23 1626 11/13/23 1749 11/13/23 1807 11/14/23 0407 11/15/23 0414 11/16/23 0450 11/18/23 0434  NA 144  --  144 142 138 140 145  K 3.3*  --  2.4* 4.2 3.8 3.8 4.5  CL 107  --  108 114* 111 111 114*  CO2 15*  --   --  14* 18* 18* 20*  GLUCOSE 81  --  80 70 77 78 76  BUN 15  --  14 13 9  7* 13  CREATININE 1.26*  --  0.80 0.99 0.73 0.73 0.78  CALCIUM  6.9*  --   --  6.4* 6.6* 6.9* 7.5*  MG  --  0.5*  --  1.2* 1.3* 1.9 1.7  PHOS  --   --   --  1.3* 2.6 2.5 2.4*      Recent Results (from the past 240 hours)  Resp panel by RT-PCR (RSV, Flu A&B, Covid) Anterior Nasal Swab     Status: None   Collection Time: 11/13/23  6:05 PM   Specimen: Anterior Nasal Swab  Result Value Ref Range Status   SARS Coronavirus 2 by RT PCR NEGATIVE NEGATIVE Final   Influenza A by PCR NEGATIVE NEGATIVE Final   Influenza B by PCR NEGATIVE NEGATIVE Final    Comment: (NOTE) The Xpert Xpress SARS-CoV-2/FLU/RSV plus assay is intended as an aid in the diagnosis of influenza from Nasopharyngeal swab specimens and should not be used as a sole basis for treatment. Nasal washings and aspirates are unacceptable for Xpert Xpress SARS-CoV-2/FLU/RSV testing.  Fact Sheet for Patients: bloggercourse.com  Fact Sheet for Healthcare Providers: seriousbroker.it  This test is not yet approved or cleared by the [place]  FDA and  has been authorized for detection and/or diagnosis of SARS-CoV-2 by FDA under an Emergency Use Authorization (EUA). This EUA will remain in effect (meaning this test can be used) for the duration of the COVID-19 declaration under Section 564(b)(1) of the Act, 21 U.S.C. section 360bbb-3(b)(1), unless the authorization is terminated or revoked.     Resp Syncytial Virus by PCR NEGATIVE NEGATIVE Final    Comment: (NOTE) Fact Sheet for Patients: bloggercourse.com  Fact Sheet for Healthcare Providers: seriousbroker.it  This test is not yet approved or cleared by the [place]  FDA and has been authorized for detection and/or diagnosis of SARS-CoV-2 by FDA under an Emergency Use Authorization (EUA). This EUA will remain in effect (meaning this test can be used) for the duration of the COVID-19 declaration under Section 564(b)(1) of the Act, 21 U.S.C. section 360bbb-3(b)(1), unless the authorization is terminated or revoked.  Performed at [facility], [address], [place], [place] [address]      Radiology Studies: MR BRAIN WO CONTRAST Result Date: 11/18/2023 EXAM: MRI BRAIN WITHOUT CONTRAST 11/18/2023 11:04:21 AM TECHNIQUE: Multiplanar multisequence MRI of the head/brain was performed without the administration of intravenous contrast. COMPARISON: None available. CLINICAL HISTORY: Syncope/presyncope, cerebrovascular cause suspected. FINDINGS: BRAIN AND VENTRICLES: There is no evidence of an acute infarct, intracranial hemorrhage, mass, midline shift, hydrocephalus, or extra-axial fluid collection. T2 hyperintensities in the cerebral white matter bilaterally are nonspecific but compatible with mild chronic small vessel ischemic disease. 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 There is moderately advanced cerebral and cerebellar atrophy. Major intracranial vascular flow voids are preserved. ORBITS: No acute abnormality. SINUSES AND MASTOIDS: Large right and trace left mastoid effusions. Clear paranasal sinuses. BONES AND SOFT TISSUES: Normal marrow signal. No acute soft tissue abnormality. IMPRESSION: 1. No acute intracranial abnormality. 2. Moderately advanced atrophy. 3. Mild chronic small vessel ischemic disease. 4. Large right mastoid effusion. Electronically signed by: Dasie Hamburg MD 11/18/2023 11:15 AM EST RP Workstation: [id number]     Scheduled Meds:  apixaban   5 mg Oral BID   bisacodyl  10 mg Rectal Once   cilostazol   50 mg Oral BID   insulin  aspart  0-9 Units Subcutaneous TID WC   mirtazapine   7.5 mg Oral Daily   multivitamin with minerals  1 tablet Oral Daily   nutrition supplement (JUVEN)  1 packet Oral BID BM   pantoprazole   40 mg Oral Daily   phosphorus  500 mg Oral TID   polyethylene glycol  17 g Oral Daily   senna-docusate  1 tablet Oral BID   thiamine   100 mg Oral Daily   Continuous Infusions:  promethazine (PHENERGAN) injection (IM or IVPB) 12.5 mg (11/18/23 1123)      LOS: 4  days   Ivonne Mustache, MD [facility] P11/17/2025, 1:23 PM

## 2023-11-18 NOTE — TOC Progression Note (Addendum)
 Transition of Care [facility]) - Progression Note    Patient Details  Name: Angie Carr MRN: [id number] Date of Birth: 1955/10/13  [facility]) CM/SW Contact  Luise JAYSON Pan, [place] Phone Number: 11/18/2023, 2:02 PM  Clinical Narrative:   Per MD, patient is medically stable for discharge.   CSW followed up with patients sister about bed choice. Ronal stated she has not spoken to patient today and does not know what pt decisded for SNF. CSW to follow up with pt.   4:14 PM Patient stating she would be willing to go to [facility] SNF. CSW informed patient that [facility] denied patient referral at this time. CSW reviewed current bed offers for SNF. Patient stating she'd rather go home. CSW to follow up.   Expected Discharge Plan: Skilled Nursing Facility Barriers to Discharge: Continued Medical Work up               Expected Discharge Plan and Services In-house Referral: Clinical Social Work   Post Acute Care Choice: Skilled Nursing Facility Living arrangements for the past 2 months: Single Family Home                                       Social Drivers of Health (SDOH) Interventions SDOH Screenings   Food Insecurity: No Food Insecurity (11/14/2023)  Housing: Low Risk  (11/14/2023)  Transportation Needs: No Transportation Needs (11/14/2023)  Utilities: Not At Risk (11/14/2023)  Depression (PHQ2-9): Low Risk  (10/17/2023)  Financial Resource Strain: Low Risk  (11/04/2023)  Physical Activity: Inactive (11/04/2023)  Social Connections: Moderately Integrated (11/14/2023)  Stress: No Stress Concern Present (11/04/2023)  Tobacco Use: High Risk (11/14/2023)  Health Literacy: Adequate Health Literacy (11/04/2023)    Readmission Risk Interventions    09/17/2023    3:57 PM 08/29/2023    2:12 PM  Readmission Risk Prevention Plan  Transportation Screening Complete Complete  PCP or Specialist Appt within 5-7 Days  Complete  Home Care Screening Complete  Complete  Medication Review (RN CM) Complete Referral to Pharmacy

## 2023-11-18 NOTE — H&P (View-Only) (Signed)
 Patient ID: Angie Carr, female   DOB: April 09, 1955, 68 y.o.   MRN: [id number]    Progress Note   Subjective   Day # 4 CC; weakness, dysphagia, poor oral intake, weight loss  Asked by hospitalist to follow-up again today, patient had been seen by GI/Dr. Kristie over the weekend.  Patient says she had rather acute onset of dysphagia in August 2025 around the time that she had surgery for a hip replacement.  Prior to that had not had any issues.  She is currently having significant difficulty with liquids and solids and a sensation of food sitting in her esophagus for prolonged periods of time frequently requiring regurgitation.  She feels she has lost about 60 pounds since onset of symptoms.  MRI brain without contrast-no acute abnormality, mild chronic small vessel ischemic disease, right mastoid effusion CT abdomen and pelvis with contrast-esophagus appears normal fatty changes of the liver no ductal dilation, stable dilation of the main pulmonary artery compatible with pulmonary artery hypertension, 4.1 cm ascending aortic aneurysm recommend annual follow-up. Barium swallow 09/18/2023-disruption of primary peristaltic waves in the distal esophagus on multiple swallows favoring nonspecific esophageal dysmotility some resulting stasis of contrast in the distal esophagus no hiatal hernia    Objective   Vital signs in last 24 hours: Temp:  [98.4 F (36.9 C)-98.8 F (37.1 C)] 98.7 F (37.1 C) (11/17 1534) Pulse Rate:  [85-94] 94 (11/17 1534) Resp:  [18] 18 (11/17 1534) BP: (134-149)/(75-89) 143/82 (11/17 1534) SpO2:  [99 %-100 %] 100 % (11/17 1534) Last BM Date : 11/17/23 General:    Older African-American female in NAD Heart:  Regular rate and rhythm; no murmurs Lungs: Respirations even and unlabored, lungs CTA bilaterally Abdomen:  Soft, nontender and nondistended. Normal bowel sounds. Extremities:  Without edema. Neurologic:  Alert and oriented,  grossly normal  neurologically. Psych:  Cooperative. Normal mood and affect.  Intake/Output from previous day: 11/16 0701 - 11/17 0700 In: 400 [P.O.:400] Out: 180 [Urine:150; Emesis/NG output:30] Intake/Output this shift: Total I/O In: 50 [IV Piggyback:50] Out: -   Lab Results: No results for input(s): WBC, HGB, HCT, PLT in the last 72 hours. BMET Recent Labs    11/16/23 0450 11/18/23 0434  NA 140 145  K 3.8 4.5  CL 111 114*  CO2 18* 20*  GLUCOSE 78 76  BUN 7* 13  CREATININE 0.73 0.78  CALCIUM  6.9* 7.5*   LFT No results for input(s): PROT, ALBUMIN, AST, ALT, ALKPHOS, BILITOT, BILIDIR, IBILI in the last 72 hours. PT/INR No results for input(s): LABPROT, INR in the last 72 hours.  Studies/Results: MR BRAIN WO CONTRAST Result Date: 11/18/2023 EXAM: MRI BRAIN WITHOUT CONTRAST 11/18/2023 11:04:21 AM TECHNIQUE: Multiplanar multisequence MRI of the head/brain was performed without the administration of intravenous contrast. COMPARISON: None available. CLINICAL HISTORY: Syncope/presyncope, cerebrovascular cause suspected. FINDINGS: BRAIN AND VENTRICLES: There is no evidence of an acute infarct, intracranial hemorrhage, mass, midline shift, hydrocephalus, or extra-axial fluid collection. T2 hyperintensities in the cerebral white matter bilaterally are nonspecific but compatible with mild chronic small vessel ischemic disease. 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 There is moderately advanced cerebral and cerebellar atrophy. Major intracranial vascular flow voids are preserved. ORBITS: No acute abnormality. SINUSES AND MASTOIDS: Large right and trace left mastoid effusions. Clear paranasal sinuses. BONES AND SOFT TISSUES: Normal marrow signal. No acute soft tissue abnormality. IMPRESSION: 1. No acute intracranial abnormality. 2. Moderately advanced atrophy. 3. Mild chronic small vessel ischemic disease. 4. Large  right mastoid effusion.  Electronically signed by: Dasie Hamburg MD 11/18/2023 11:15 AM EST RP Workstation: [id number]       Assessment / Plan:    #61 68 year old female with 3-1/22-month history of dysphagia symptomatic with liquids and solids with sensation of food sitting in her esophagus for prolonged periods of time and episodes of regurgitation on a daily basis.  Associated weight loss/significant  Patient was admitted with weakness, and electrolyte derangements possibly secondary to poor oral intake.  Recent barium swallow does show what appears to be nonspecific dysmotility.  Rule out presbyesophagus versus other motility disorder, rule out subtle stricture not appreciated on barium swallow, rule out achalasia  # 2 anemia normocytic #3 electrolyte derangement corrected #4 diabetes mellitus #5.  History of PE postoperative hip replacement August 2025-on Eliquis  #6 arterial disease-on Pletal  #7 history of GERD-PPI  Plan; will plan for EGD with possible esophageal dilation with Dr. San for Wednesday, 11/20/2023.  Procedure was discussed in detail with the patient including indications risks and benefits and she is agreeable to proceed. Will need to hold Eliquis -have stopped for now Okay to continue Pletal   Patient apparently had also been planned for colonoscopy as an outpatient with [facility].  Given her significant dysphagia at present not a good candidate for bowel prep, and that can be completed as an outpatient  Soft diet, upright 90 degrees for meals and 1 hour postprandially  GI will follow along with you       Principal Problem:   Hypokalemia Active Problems:   Abdominal aortic aneurysm (AAA) >39 mm diameter   Gastroesophageal reflux disease   Generalized weakness   Dizziness   History of pulmonary embolism   Protein-calorie malnutrition, severe     LOS: 4 days   Amy Esterwood PA-C 11/18/2023, 3:35 PM    Attending physician's note   I have taken a history, reviewed the  chart, and examined the patient. I performed a substantive portion of this encounter, including complete performance of at least one of the key components, in conjunction with the APP. I agree with the APP's note, impression, and recommendations with my edits.   GI service asked to reconsult on this patient.  Essentially had new onset dysphagia in 08/2023 at started around the time of her hip surgery.  She does have a longstanding history of GERD, which was generally well-controlled with omeprazole , but no prior history of dysphagia.  Does not think she has had an EGD in the past.  Endorses trouble with solids and liquids along with pills.  Had barium swallow on 9/17 that showed nonspecific esophageal dysmotility with esophageal stasis.  - Plan for EGD on this admission for diagnostic and potentially therapeutic intent with esophageal dilation and/or biopsies as appropriate - Hold Eliquis  - Plan for EGD on Wednesday, 11/19 after Eliquis  washout - Will intentionally continue Pletal  through perioperative period - Resume PPI - Additional recommendations pending endoscopic findings  A total of 50 minutes of time was spent on this encounter, including in depth chart review, independent review of results as outlined above, communicating results with the patient and family members at bedside directly, face-to-face time with the patient, coordinating care, and ordering studies and medications as appropriate, and documentation.   [address] DO, FACG [phone] office

## 2023-11-18 NOTE — Care Management Important Message (Signed)
 Important Message  Patient Details  Name: Angie Carr MRN: [id number] Date of Birth: 12-19-1955   Important Message Given:  Yes - Medicare IM     Claretta Deed 11/18/2023, 1:49 PM

## 2023-11-18 NOTE — Progress Notes (Signed)
 Physical Therapy Treatment Patient Details Name: Angie Carr MRN: [id number] DOB: 08-30-1955 Today's Date: 11/18/2023   History of Present Illness Pt is a 68 y.o. female who presented 11/10/23 due to dizziness and weakness. Pt was admitted due to hypokalemia. PMHx: DM2 , HTN, peripheral vascular disease, PE , anxiety asthma,  hypertension, neuromuscular disorder, COVID, DJD    PT Comments  Pt continues with constant nausea and inability to eat due to n/v. Spoke extensively regarding what the vestibular system does as well as benefits/needs of inpatient rehab program as pt unable to get up OOB without assist like she was PTA. During extensive discussion with patient, HOB at 50 deg elevation, PT observed pt with L horizontal beating nystagmus when turning head to the L and R horizontal beating nystagmus with turning head to the R. Nystagmus did not lessen or resolve s/p 1 min. Pt reports worsening onset of nausea. Trialed gaze stabilization however no improvement on nystagmus or worsening nausea. Pt reports a mixture of feeling queasy, room spinning, upset stomach, and feeling off. Pt denies loss of hearing in either ear but reports R ear feeling stopped up in which PT observed pt several times trying to plug her nose and blow to try to pop it. Pt also is light sensitive. Pt very anxious regarding moving and eating as it constantly results in worsening nausea and/or emesis. Suspect possible central vestibular cause to patients symptoms. Spoke with Dr. Jillian regarding ordering a brain MRI. Acute PT to cont to follow to progress mobility.   If plan is discharge home, recommend the following: A lot of help with walking and/or transfers;A lot of help with bathing/dressing/bathroom;Assist for transportation;Help with stairs or ramp for entrance   Can travel by private vehicle     No  Equipment Recommendations       Recommendations for Other Services       Precautions / Restrictions  Precautions Precautions: Fall Recall of Precautions/Restrictions: Impaired Precaution/Restrictions Comments: horizontal beating nystagmus with head turns L/R at 50 deg, constant nausea Restrictions Weight Bearing Restrictions Per Provider Order: No     Mobility  Bed Mobility Overal bed mobility: Needs Assistance             General bed mobility comments: head of bed raised to 50 deg, pt with nausea and kept requesting can we just sit her and talk a few minutes    Transfers                   General transfer comment: pt declined    Ambulation/Gait               General Gait Details: pt declined   Stairs             Wheelchair Mobility     Tilt Bed    Modified Rankin (Stroke Patients Only)       Balance Overall balance assessment:  (not tested)                                          Communication Communication Communication: No apparent difficulties  Cognition Arousal: Alert Behavior During Therapy: Anxious   PT - Cognitive impairments: No family/caregiver present to determine baseline                       PT - Cognition Comments: pt anxious regarding moving  and making her nausea worse. Pt also anxious regarding why she is so nauseated and what she needs rehab for stating That's what I'm trying to figure out. Why do I need rehab? Spoke extensively regarding role of rehab and how she is unable to mobilize or care for herself like she was PTA. Discussed vestibular system and what it does and how treatments can help Following commands: Intact      Cueing Cueing Techniques: Verbal cues  Exercises      General Comments General comments (skin integrity, edema, etc.): vestibular assessment      Pertinent Vitals/Pain Pain Assessment Pain Assessment: No/denies pain    Home Living                          Prior Function            PT Goals (current goals can now be found in the care plan  section) Acute Rehab PT Goals Patient Stated Goal: to feel better PT Goal Formulation: With patient Time For Goal Achievement: 11/28/23 Potential to Achieve Goals: Good Progress towards PT goals: Progressing toward goals    Frequency    Min 3X/week      PT Plan      Co-evaluation              AM-PAC PT 6 Clicks Mobility   Outcome Measure  Help needed turning from your back to your side while in a flat bed without using bedrails?: A Lot Help needed moving from lying on your back to sitting on the side of a flat bed without using bedrails?: A Lot Help needed moving to and from a bed to a chair (including a wheelchair)?: A Lot Help needed standing up from a chair using your arms (e.g., wheelchair or bedside chair)?: A Lot Help needed to walk in hospital room?: Total Help needed climbing 3-5 steps with a railing? : Total 6 Click Score: 10    End of Session   Activity Tolerance: Other (comment) (limited by nausea) Patient left: in bed;with call bell/phone within reach;with bed alarm set Nurse Communication: Mobility status;Other (comment) (vestibular assessment results; notified MD of direction changing nystagmus as well requesting brain imaging) PT Visit Diagnosis: Unsteadiness on feet (R26.81);Other abnormalities of gait and mobility (R26.89);Muscle weakness (generalized) (M62.81);Difficulty in walking, not elsewhere classified (R26.2);BPPV;Dizziness and giddiness (R42)     Time: 9245-9165 PT Time Calculation (min) (ACUTE ONLY): 40 min  Charges:    $Therapeutic Activity: 23-37 mins $Physical Performance Test: 8-22 mins PT General Charges $$ ACUTE PT VISIT: 1 Visit                     Norene Ames, PT, DPT Acute Rehabilitation Services Secure chat preferred Office #: [phone]    Norene CHRISTELLA Ames 11/18/2023, 9:05 AM

## 2023-11-19 DIAGNOSIS — E876 Hypokalemia: Secondary | ICD-10-CM | POA: Diagnosis not present

## 2023-11-19 LAB — GLUCOSE, CAPILLARY
Glucose-Capillary: 100 mg/dL — ABNORMAL HIGH (ref 70–99)
Glucose-Capillary: 74 mg/dL (ref 70–99)
Glucose-Capillary: 77 mg/dL (ref 70–99)
Glucose-Capillary: 81 mg/dL (ref 70–99)
Glucose-Capillary: 83 mg/dL (ref 70–99)

## 2023-11-19 LAB — MAGNESIUM: Magnesium: 1.5 mg/dL — ABNORMAL LOW (ref 1.7–2.4)

## 2023-11-19 LAB — PHOSPHORUS: Phosphorus: 3.4 mg/dL (ref 2.5–4.6)

## 2023-11-19 LAB — POTASSIUM: Potassium: 3.1 mmol/L — ABNORMAL LOW (ref 3.5–5.1)

## 2023-11-19 MED ORDER — MAGNESIUM SULFATE 2 GM/50ML IV SOLN
2.0000 g | Freq: Once | INTRAVENOUS | Status: AC
  Administered 2023-11-19: 2 g via INTRAVENOUS
  Filled 2023-11-19: qty 50

## 2023-11-19 MED ORDER — POTASSIUM CHLORIDE CRYS ER 20 MEQ PO TBCR
40.0000 meq | EXTENDED_RELEASE_TABLET | ORAL | Status: AC
  Administered 2023-11-19 (×2): 40 meq via ORAL
  Filled 2023-11-19 (×2): qty 2

## 2023-11-19 MED ORDER — MAGNESIUM OXIDE -MG SUPPLEMENT 400 (240 MG) MG PO TABS
800.0000 mg | ORAL_TABLET | Freq: Every day | ORAL | Status: DC
  Administered 2023-11-20 – 2023-11-21 (×2): 800 mg via ORAL
  Filled 2023-11-19 (×2): qty 2

## 2023-11-19 NOTE — Progress Notes (Signed)
 OT Cancellation Note  Patient Details Name: Angie Carr MRN: [id number] DOB: [birth year]   Cancelled Treatment:    Reason Eval/Treat Not Completed: Other (comment) (Pt reported they just got back to bed after having BM and been awake since 5am and needs to rest. Will follow up as time permits.)  Dniyah Grant K OTR/L  Acute Rehab Services  [phone] office number  [name] 11/19/2023, 9:05 AM

## 2023-11-19 NOTE — TOC Progression Note (Signed)
 Transition of Care [facility]) - Progression Note    Patient Details  Name: Angie Carr MRN: [id number] Date of Birth: 29-Mar-1955  [facility] CM/SW Contact  Lendia Dais, [place] Phone Number: 11/19/2023, 2:21 PM  Clinical Narrative: CSW spoke to the pt at bedside to inquire about decision of SNF bed. Pt stated that she does not understand why she needs STR, CSW encouraged the pt to ask PT when the she her today and that the CSW will follow up.   CSW will continue to follow.      Expected Discharge Plan: Skilled Nursing Facility Barriers to Discharge: Continued Medical Work up               Expected Discharge Plan and Services In-house Referral: Clinical Social Work   Post Acute Care Choice: Skilled Nursing Facility Living arrangements for the past 2 months: Single Family Home                                       Social Drivers of Health (SDOH) Interventions SDOH Screenings   Food Insecurity: No Food Insecurity (11/14/2023)  Housing: Low Risk  (11/14/2023)  Transportation Needs: No Transportation Needs (11/14/2023)  Utilities: Not At Risk (11/14/2023)  Depression (PHQ2-9): Low Risk  (10/17/2023)  Financial Resource Strain: Low Risk  (11/04/2023)  Physical Activity: Inactive (11/04/2023)  Social Connections: Moderately Integrated (11/14/2023)  Stress: No Stress Concern Present (11/04/2023)  Tobacco Use: High Risk (11/14/2023)  Health Literacy: Adequate Health Literacy (11/04/2023)    Readmission Risk Interventions    09/17/2023    3:57 PM 08/29/2023    2:12 PM  Readmission Risk Prevention Plan  Transportation Screening Complete Complete  PCP or Specialist Appt within 5-7 Days  Complete  Home Care Screening Complete Complete  Medication Review (RN CM) Complete Referral to Pharmacy

## 2023-11-19 NOTE — Plan of Care (Signed)
   Problem: Coping: Goal: Ability to adjust to condition or change in health will improve Outcome: Progressing   Problem: Fluid Volume: Goal: Ability to maintain a balanced intake and output will improve Outcome: Progressing

## 2023-11-19 NOTE — Plan of Care (Signed)
   Problem: Skin Integrity: Goal: Risk for impaired skin integrity will decrease Outcome: Progressing

## 2023-11-19 NOTE — Progress Notes (Signed)
 Physical Therapy Treatment Patient Details Name: Angie Carr MRN: [id number] DOB: 1955/11/16 Today's Date: 11/19/2023   History of Present Illness Pt is a 68 y.o. female who presented 11/10/23 due to dizziness and weakness. Pt was admitted due to hypokalemia. PMHx: DM2 , HTN, peripheral vascular disease, PE , anxiety asthma,  hypertension, neuromuscular disorder, COVID, DJD    PT Comments  Pt admitted with above diagnosis. Pt was able to tolerate Epley maneuver for right anterior canal BPPV. Tested negative for horizontal canal BPPV today and left posterior canal appears clear. Pt reports that dizziness improved after yesterday therefore hopeful that treatment today will be effective as well.  Pt still needing +2 min assist for minimal EOB activity.  Continue to recommend post acute rehab < 3 hours day.   Pt currently with functional limitations due to the deficits listed below (see PT Problem List). Pt will benefit from acute skilled PT to increase their independence and safety with mobility to allow discharge.       If plan is discharge home, recommend the following: A lot of help with walking and/or transfers;A lot of help with bathing/dressing/bathroom;Assist for transportation;Help with stairs or ramp for entrance   Can travel by private vehicle     No  Equipment Recommendations  BSC/3in1    Recommendations for Other Services       Precautions / Restrictions Precautions Precautions: Fall Recall of Precautions/Restrictions: Impaired Restrictions Weight Bearing Restrictions Per Provider Order: No     Mobility  Bed Mobility Overal bed mobility: Needs Assistance Bed Mobility: Supine to Sit, Sit to Supine, Rolling Rolling: Min assist, Used rails   Supine to sit: Mod assist, HOB elevated, Used rails Sit to supine: Mod assist   General bed mobility comments: Pt positive for right anterior canal BPPV.  Pt treated via Epley maneuver with difficulty at times as pt very anxious  and doesnt move easily.  Was able to complete Epley. Needs incr assist and cues as pt is very anxious to move and for PT to assist pt in moving.    Transfers Overall transfer level: Needs assistance Equipment used: Rolling walker (2 wheels) Transfers: Sit to/from Stand Sit to Stand: Min assist, +2 safety/equipment           General transfer comment: Pt stood to RW and was able to take side steps to [facility] with cues.  Leans posteriorly at times.    Ambulation/Gait                   Stairs             Wheelchair Mobility     Tilt Bed    Modified Rankin (Stroke Patients Only)       Balance Overall balance assessment: Needs assistance Sitting-balance support: Feet supported, Bilateral upper extremity supported, No upper extremity supported Sitting balance-Leahy Scale: Poor Sitting balance - Comments: needed BUE support Postural control: Posterior lean Standing balance support: Bilateral upper extremity supported Standing balance-Leahy Scale: Poor Standing balance comment: reliant on BUE support                            Communication Communication Communication: No apparent difficulties  Cognition Arousal: Alert Behavior During Therapy: Anxious   PT - Cognitive impairments: No family/caregiver present to determine baseline                         Following commands: Intact  Cueing Cueing Techniques: Verbal cues  Exercises      General Comments        Pertinent Vitals/Pain Pain Assessment Pain Assessment: Faces Faces Pain Scale: Hurts even more Pain Location: BLE Pain Descriptors / Indicators: Grimacing, Guarding, Discomfort, Sore Pain Intervention(s): Limited activity within patient's tolerance, Monitored during session, Repositioned    Home Living                          Prior Function            PT Goals (current goals can now be found in the care plan section) Acute Rehab PT Goals Patient  Stated Goal: to feel better Progress towards PT goals: Progressing toward goals    Frequency    Min 2X/week      PT Plan      Co-evaluation              AM-PAC PT 6 Clicks Mobility   Outcome Measure  Help needed turning from your back to your side while in a flat bed without using bedrails?: A Lot Help needed moving from lying on your back to sitting on the side of a flat bed without using bedrails?: A Lot Help needed moving to and from a bed to a chair (including a wheelchair)?: Total Help needed standing up from a chair using your arms (e.g., wheelchair or bedside chair)?: A Lot Help needed to walk in hospital room?: Total Help needed climbing 3-5 steps with a railing? : Total 6 Click Score: 9    End of Session Equipment Utilized During Treatment: Gait belt Activity Tolerance: Patient limited by fatigue;Patient limited by pain Patient left: in bed;with call bell/phone within reach;with bed alarm set Nurse Communication: Mobility status;Other (comment) (Note to keep HOB > 15 degrees after Epley for next 24 hours) PT Visit Diagnosis: Unsteadiness on feet (R26.81);Other abnormalities of gait and mobility (R26.89);Muscle weakness (generalized) (M62.81);Difficulty in walking, not elsewhere classified (R26.2);BPPV;Dizziness and giddiness (R42) BPPV - Right/Left : Left     Time: 8747-8662 PT Time Calculation (min) (ACUTE ONLY): 45 min  Charges:    $Therapeutic Activity: 23-37 mins $Canalith Rep Proc: 8-22 mins PT General Charges $$ ACUTE PT VISIT: 1 Visit                     Arlin Savona M,PT Acute Rehab Services [phone]    Angie Carr 11/19/2023, 3:53 PM

## 2023-11-19 NOTE — Progress Notes (Signed)
 PROGRESS NOTE  QUIN MCPHERSON  FMW:[id number] DOB: [birth year] DOA: 11/13/2023 PCP: Benjamine Aland, MD   Brief Narrative: Patient is a 68 year old female with history of hypertension, diabetes type 2, PE on Eliquis , peripheral artery disease, GERD, arthritis, anemia who presented with complaints of dizziness, weakness.  She was recently hospitalized in September when she was found to have hypokalemia/hypomagnesemia.  She was taking supplementation at home but recently discontinued by her PCP.  On presentation, she was hemodynamically stable.  Lab work showed potassium of 2.4, magnesium  of 0.5.  EKG showed normal sinus rhythm with occasional PVCs/PACs.  CTA chest study negative for PE but showed 4.1 cm ascending aortic aneurysm.  Patient admitted for the management of severe deficiency of electrolytes.  Currently being monitored and supplemented as needed.  PT recommended SNF on discharge.  GI/speech therapy consulted for persistent dysphagia, poor oral intake.  GI planning for EGD tomorrow.TOC following for SNF bed  Assessment & Plan:  Principal Problem:   Hypokalemia Active Problems:   Abdominal aortic aneurysm (AAA) >39 mm diameter   Gastroesophageal reflux disease   Generalized weakness   Dizziness   History of pulmonary embolism   Protein-calorie malnutrition, severe   Esophageal dysphagia   Abnormal finding on GI tract imaging   Hypokalemia/hypomagnesemia/hypophosphatemia: Presented with weakness, dizziness.  Severely deficient in electrolytes.  This is from poor oral intake that has been going on for several months.  Continue monitoring and supplementation.  Denies any nausea, vomiting or diarrhea currently.  Abdomen benign on examination  Poor oral intake/dysphagia: Patient has poor appetite, choking history with food.  Has not have significant oral intake since last 3 to 4 months.  She seen by [facility] on September for the same.  She has been following with [facility]  and the plan is for colonoscopy/EGD next week.  As per patient and family request, we consulted GI here.  Also consulted speech therapy, dietitian.  She has poor dentition, esophageal dysmotility noted on barium swallow but she is tolerating solid foods well.   Speech therapy following.  Dietitian following.  Started on dysphagia 3 diet.Plan for EGD tomorrow  Vertigo: Complains of positional vertigo with room spinning around her.  Noticed to have nystagmus as well.  Likely BPPV. status post Epley's maneuver .PT recommending SNF on discharge.  Started on meclizine.  No focal deficits .MRI did not show any acute findings.  Type 2 diabetes: Well-controlled.  Recent A1c of 4.8.  Recently taken off from metformin  by her PCP.  Continue sliding scale  Generalized weakness/dizziness/deconditioning: PT requested.  Orthostatic vitals negative.  Recommended SNF on discharge.  TOC consulted.  Lives with family.  Ambulates with a walker  History of PE: On Eliquis   Ascending aortic aneurysm: CTA chest showed 4.1 cm ascending aortic aneurysm.  Needs annual follow-up imaging by CTA or MRA.  She needs to follow-up with  vascular surgery as an outpatient on discharge.  History of peripheral artery disease: Continue cilostazol   GERD: Continue PPI  Anxiety: On mirtazipine  Constipation: Continue bowel regimen   Nutrition Problem: Severe Malnutrition Etiology: chronic illness Wound 11/13/23 2300 Pressure Injury Coccyx Stage 2 -  Partial thickness loss of dermis presenting as a shallow open injury with a red, pink wound bed without slough. (Active)    DVT prophylaxis:     Code Status: Full Code  Family Communication: Sister at bedside on 11/15  Patient status:Inpatient  Patient is from :Home  Anticipated discharge to:SNF  Estimated DC date:after GI  work up   Consultants: None  Procedures:None  Antimicrobials:  Anti-infectives (From admission, onward)    None       Subjective: Patient  seen and examined at bedside today.  Hemodynamically stable.  Overall comfortable.  Lying on bed.  Denies nausea, vomiting and abdominal pain today.  Plan for EGD tomorrow.   Objective: Vitals:   11/18/23 2000 11/19/23 0011 11/19/23 0551 11/19/23 0812  BP: 128/66 136/78 138/78 130/84  Pulse: 79 85 82 (!) 102  Resp:    19  Temp: 98.5 F (36.9 C) 98.7 F (37.1 C) 98.3 F (36.8 C) 98.4 F (36.9 C)  TempSrc: Oral  Oral   SpO2: 98% 99% 99% 100%  Weight:      Height:        Intake/Output Summary (Last 24 hours) at 11/19/2023 1106 Last data filed at 11/19/2023 0900 Gross per 24 hour  Intake 50 ml  Output 270 ml  Net -220 ml    Filed Weights   11/13/23 1627 11/14/23 0113  Weight: 74.4 kg 77.8 kg    Examination:  General exam: Overall comfortable, not in distress HEENT: PERRL,poor dentition Respiratory system:  no wheezes or crackles  Cardiovascular system: S1 & S2 heard, RRR.  Gastrointestinal system: Abdomen is nondistended, soft and nontender. Central nervous system: Alert and oriented Extremities: No edema, no clubbing ,no cyanosis Skin: No rashes, no ulcers,no icterus      Data Reviewed: I have personally reviewed following labs and imaging studies  CBC: Recent Labs  Lab 11/13/23 1626 11/13/23 1807 11/14/23 0407  WBC 5.1  --  4.6  HGB 11.5* 11.9* 9.3*  HCT 34.0* 35.0* 26.4*  MCV 95.5  --  92.3  PLT 164  --  133*   Basic Metabolic Panel: Recent Labs  Lab 11/13/23 1626 11/13/23 1749 11/13/23 1807 11/14/23 0407 11/15/23 0414 11/16/23 0450 11/18/23 0434 11/19/23 0406  NA 144  --  144 142 138 140 145  --   K 3.3*  --  2.4* 4.2 3.8 3.8 4.5 3.1*  CL 107  --  108 114* 111 111 114*  --   CO2 15*  --   --  14* 18* 18* 20*  --   GLUCOSE 81  --  80 70 77 78 76  --   BUN 15  --  14 13 9  7* 13  --   CREATININE 1.26*  --  0.80 0.99 0.73 0.73 0.78  --   CALCIUM  6.9*  --   --  6.4* 6.6* 6.9* 7.5*  --   MG  --    < >  --  1.2* 1.3* 1.9 1.7 1.5*  PHOS  --   --    --  1.3* 2.6 2.5 2.4* 3.4   < > = values in this interval not displayed.     Recent Results (from the past 240 hours)  Resp panel by RT-PCR (RSV, Flu A&B, Covid) Anterior Nasal Swab     Status: None   Collection Time: 11/13/23  6:05 PM   Specimen: Anterior Nasal Swab  Result Value Ref Range Status   SARS Coronavirus 2 by RT PCR NEGATIVE NEGATIVE Final   Influenza A by PCR NEGATIVE NEGATIVE Final   Influenza B by PCR NEGATIVE NEGATIVE Final    Comment: (NOTE) The Xpert Xpress SARS-CoV-2/FLU/RSV plus assay is intended as an aid in the diagnosis of influenza from Nasopharyngeal swab specimens and should not be used as a sole basis for treatment. Nasal washings and  aspirates are unacceptable for Xpert Xpress SARS-CoV-2/FLU/RSV testing.  Fact Sheet for Patients: bloggercourse.com  Fact Sheet for Healthcare Providers: seriousbroker.it  This test is not yet approved or cleared by the [place]  FDA and has been authorized for detection and/or diagnosis of SARS-CoV-2 by FDA under an Emergency Use Authorization (EUA). This EUA will remain in effect (meaning this test can be used) for the duration of the COVID-19 declaration under Section 564(b)(1) of the Act, 21 U.S.C. section 360bbb-3(b)(1), unless the authorization is terminated or revoked.     Resp Syncytial Virus by PCR NEGATIVE NEGATIVE Final    Comment: (NOTE) Fact Sheet for Patients: bloggercourse.com  Fact Sheet for Healthcare Providers: seriousbroker.it  This test is not yet approved or cleared by the [place]  FDA and has been authorized for detection and/or diagnosis of SARS-CoV-2 by FDA under an Emergency Use Authorization (EUA). This EUA will remain in effect (meaning this test can be used) for the duration of the COVID-19 declaration under Section 564(b)(1) of the Act, 21 U.S.C. section 360bbb-3(b)(1), unless  the authorization is terminated or revoked.  Performed at [facility], [address], [place], [place] [address]      Radiology Studies: MR BRAIN WO CONTRAST Result Date: 11/18/2023 EXAM: MRI BRAIN WITHOUT CONTRAST 11/18/2023 11:04:21 AM TECHNIQUE: Multiplanar multisequence MRI of the head/brain was performed without the administration of intravenous contrast. COMPARISON: None available. CLINICAL HISTORY: Syncope/presyncope, cerebrovascular cause suspected. FINDINGS: BRAIN AND VENTRICLES: There is no evidence of an acute infarct, intracranial hemorrhage, mass, midline shift, hydrocephalus, or extra-axial fluid collection. T2 hyperintensities in the cerebral white matter bilaterally are nonspecific but compatible with mild chronic small vessel ischemic disease. 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 There is moderately advanced cerebral and cerebellar atrophy. Major intracranial vascular flow voids are preserved. ORBITS: No acute abnormality. SINUSES AND MASTOIDS: Large right and trace left mastoid effusions. Clear paranasal sinuses. BONES AND SOFT TISSUES: Normal marrow signal. No acute soft tissue abnormality. IMPRESSION: 1. No acute intracranial abnormality. 2. Moderately advanced atrophy. 3. Mild chronic small vessel ischemic disease. 4. Large right mastoid effusion. Electronically signed by: Dasie Hamburg MD 11/18/2023 11:15 AM EST RP Workstation: [id number]     Scheduled Meds:  bisacodyl  10 mg Rectal Once   cilostazol   50 mg Oral BID   insulin  aspart  0-9 Units Subcutaneous TID WC   [START ON 11/20/2023] magnesium  oxide  800 mg Oral Daily   mirtazapine   7.5 mg Oral Daily   multivitamin with minerals  1 tablet Oral Daily   nutrition supplement (JUVEN)  1 packet Oral BID BM   pantoprazole   40 mg Oral Daily   phosphorus  500 mg Oral TID   polyethylene glycol  17 g Oral Daily   potassium chloride   40 mEq Oral Q2H   senna-docusate  1 tablet Oral BID    thiamine   100 mg Oral Daily   Continuous Infusions:  promethazine (PHENERGAN) injection (IM or IVPB) Stopped (11/18/23 1152)      LOS: 5 days   Ivonne Mustache, MD [facility] P11/18/2025, 11:06 AM

## 2023-11-20 ENCOUNTER — Inpatient Hospital Stay (HOSPITAL_COMMUNITY): Admitting: Anesthesiology

## 2023-11-20 ENCOUNTER — Encounter (HOSPITAL_COMMUNITY)
Admission: EM | Disposition: A | Discharge status: Home Health | Payer: Self-pay | Source: Home / Self Care | Attending: Internal Medicine

## 2023-11-20 ENCOUNTER — Encounter (HOSPITAL_COMMUNITY): Payer: Self-pay | Admitting: Student

## 2023-11-20 DIAGNOSIS — K449 Diaphragmatic hernia without obstruction or gangrene: Secondary | ICD-10-CM

## 2023-11-20 DIAGNOSIS — F17210 Nicotine dependence, cigarettes, uncomplicated: Secondary | ICD-10-CM | POA: Diagnosis not present

## 2023-11-20 DIAGNOSIS — I10 Essential (primary) hypertension: Secondary | ICD-10-CM

## 2023-11-20 DIAGNOSIS — E876 Hypokalemia: Secondary | ICD-10-CM | POA: Diagnosis not present

## 2023-11-20 DIAGNOSIS — B3781 Candidal esophagitis: Secondary | ICD-10-CM

## 2023-11-20 DIAGNOSIS — K2100 Gastro-esophageal reflux disease with esophagitis, without bleeding: Secondary | ICD-10-CM

## 2023-11-20 DIAGNOSIS — K222 Esophageal obstruction: Secondary | ICD-10-CM

## 2023-11-20 DIAGNOSIS — R1310 Dysphagia, unspecified: Secondary | ICD-10-CM

## 2023-11-20 HISTORY — PX: ESOPHAGOGASTRODUODENOSCOPY: SHX5428

## 2023-11-20 LAB — POTASSIUM: Potassium: 3.4 mmol/L — ABNORMAL LOW (ref 3.5–5.1)

## 2023-11-20 LAB — GLUCOSE, CAPILLARY
Glucose-Capillary: 73 mg/dL (ref 70–99)
Glucose-Capillary: 105 mg/dL — ABNORMAL HIGH (ref 70–99)
Glucose-Capillary: 68 mg/dL — ABNORMAL LOW (ref 70–99)
Glucose-Capillary: 79 mg/dL (ref 70–99)
Glucose-Capillary: 84 mg/dL (ref 70–99)
Glucose-Capillary: 73 mg/dL (ref 70–99)

## 2023-11-20 LAB — PHOSPHORUS: Phosphorus: 4.8 mg/dL — ABNORMAL HIGH (ref 2.5–4.6)

## 2023-11-20 LAB — POCT I-STAT, CHEM 8
BUN: 16 mg/dL (ref 8–23)
Calcium, Ion: 1.08 mmol/L — ABNORMAL LOW (ref 1.15–1.40)
Chloride: 109 mmol/L (ref 98–111)
Creatinine, Ser: 0.7 mg/dL (ref 0.44–1.00)
Glucose, Bld: 87 mg/dL (ref 70–99)
HCT: 32 % — ABNORMAL LOW (ref 36.0–46.0)
Hemoglobin: 10.9 g/dL — ABNORMAL LOW (ref 12.0–15.0)
Potassium: 3.2 mmol/L — ABNORMAL LOW (ref 3.5–5.1)
Sodium: 145 mmol/L (ref 135–145)
TCO2: 24 mmol/L (ref 22–32)

## 2023-11-20 LAB — MAGNESIUM: Magnesium: 1.6 mg/dL — ABNORMAL LOW (ref 1.7–2.4)

## 2023-11-20 SURGERY — EGD (ESOPHAGOGASTRODUODENOSCOPY)
Anesthesia: Monitor Anesthesia Care

## 2023-11-20 MED ORDER — PANTOPRAZOLE SODIUM 40 MG PO TBEC
40.0000 mg | DELAYED_RELEASE_TABLET | Freq: Two times a day (BID) | ORAL | Status: DC
  Administered 2023-11-20 – 2023-11-21 (×2): 40 mg via ORAL
  Filled 2023-11-20 (×2): qty 1

## 2023-11-20 MED ORDER — MAGNESIUM SULFATE 2 GM/50ML IV SOLN
2.0000 g | Freq: Once | INTRAVENOUS | Status: DC
  Filled 2023-11-20: qty 50

## 2023-11-20 MED ORDER — POTASSIUM CHLORIDE 20 MEQ PO PACK
40.0000 meq | PACK | Freq: Once | ORAL | Status: AC
  Administered 2023-11-20: 40 meq via ORAL
  Filled 2023-11-20: qty 2

## 2023-11-20 MED ORDER — PROPOFOL 10 MG/ML IV BOLUS
INTRAVENOUS | Status: DC | PRN
  Administered 2023-11-20: 50 mg via INTRAVENOUS

## 2023-11-20 MED ORDER — LIDOCAINE 2% (20 MG/ML) 5 ML SYRINGE
INTRAMUSCULAR | Status: DC | PRN
  Administered 2023-11-20: 80 mg via INTRAVENOUS

## 2023-11-20 MED ORDER — DEXTROSE 50 % IV SOLN
INTRAVENOUS | Status: AC
  Filled 2023-11-20: qty 50

## 2023-11-20 MED ORDER — PROPOFOL 500 MG/50ML IV EMUL
INTRAVENOUS | Status: DC | PRN
  Administered 2023-11-20: 100 ug/kg/min via INTRAVENOUS

## 2023-11-20 MED ORDER — OXYCODONE HCL 5 MG PO TABS: 5.0000 mg | ORAL_TABLET | ORAL | Status: DC | PRN

## 2023-11-20 MED ORDER — DEXTROSE 50 % IV SOLN
12.5000 g | INTRAVENOUS | Status: AC
  Administered 2023-11-20: 12.5 g via INTRAVENOUS

## 2023-11-20 MED ORDER — SODIUM CHLORIDE 0.9 % IV SOLN
INTRAVENOUS | Status: AC | PRN
  Administered 2023-11-20: 500 mL via INTRAMUSCULAR

## 2023-11-20 NOTE — Progress Notes (Signed)
 Hypoglycemic Event  CBG: 68  Treatment: D50 25 mL (12.5 gm)  Symptoms: None  Follow-up CBG: Time:1005 CBG Result:105  Possible Reasons for Event: Inadequate meal intake  Comments/MD notified:Dr Stoltzfus aware.    Angie Carr

## 2023-11-20 NOTE — Op Note (Signed)
 [facility] Patient Name: Angie Carr Procedure Date : 11/20/2023 MRN: [id number] Attending MD: Sandor Flatter , MD, [id number] Date of Birth: Feb 10, 1955 CSN: [id number] Age: 68 Admit Type: Inpatient Procedure:                Upper GI endoscopy Indications:              Dysphagia, Suspected esophageal reflux Providers:                Sandor Flatter, MD, Jacquelyn Jaci Pierce, RN,                            Curtistine Bishop, Technician Referring MD:              Medicines:                Monitored Anesthesia Care Complications:            No immediate complications. Estimated Blood Loss:     Estimated blood loss was minimal. Procedure:                Pre-Anesthesia Assessment:                           - Prior to the procedure, a History and Physical                            was performed, and patient medications and                            allergies were reviewed. The patient's tolerance of                            previous anesthesia was also reviewed. The risks                            and benefits of the procedure and the sedation                            options and risks were discussed with the patient.                            All questions were answered, and informed consent                            was obtained. Prior Anticoagulants: The patient has                            taken Eliquis  (apixaban ), last dose was 2 days                            prior to procedure. ASA Grade Assessment: III - A                            patient with severe systemic disease. After  reviewing the risks and benefits, the patient was                            deemed in satisfactory condition to undergo the                            procedure.                           After obtaining informed consent, the endoscope was                            passed under direct vision. Throughout the                            procedure,  the patient's blood pressure, pulse, and                            oxygen saturations were monitored continuously. The                            [id number]-H190 ([id number]) Olympus endoscope was introduced                            through the mouth, and advanced to the third part                            of duodenum. The upper GI endoscopy was                            accomplished without difficulty. The patient                            tolerated the procedure well. Scope In: Scope Out: Findings:      One benign-appearing, intrinsic mild stenosis was found 38 cm from the       incisors. This stenosis measured 1 cm (in length). The stenosis was       traversed. A TTS dilator was passed through the scope. Dilation with an       18-19-20 mm balloon dilator was performed to 20 mm. The dilation site       was examined and showed mild mucosal disruption and moderate improvement       in luminal narrowing. The balloon was then reinflated to 20 mm and       tracked proximally through the esophagus for discovery and treatment of       any additional proximal subtle strictures. No additional strictures       found. Biopsies were then taken from the stricture for dual purposes of       further fracturing of the ring and histology. Estimated blood loss was       minimal.      The upper third of the esophagus and middle third of the esophagus were       normal. Biopsies were taken with a cold forceps for histology. Estimated       blood loss was minimal.  LA Grade A (one or more mucosal breaks less than 5 mm, not extending       between tops of 2 mucosal folds) esophagitis with no bleeding was found       40 cm from the incisors.      A 2 cm sliding type hiatal hernia was present.      The gastroesophageal flap valve was visualized endoscopically and       classified as Hill Grade III (minimal fold, loose to endoscope, hiatal       hernia likely).      The entire examined stomach was normal.       The examined duodenum was normal. Impression:               - Benign-appearing esophageal stenosis. Dilated                            with a 20 mm TTS balloon then fractured with                            forceps.                           - Normal upper third of esophagus and middle third                            of esophagus. Biopsied.                           - LA Grade A reflux esophagitis with no bleeding.                           - 2 cm hiatal hernia.                           - Gastroesophageal flap valve classified as Hill                            Grade III (minimal fold, loose to endoscope, hiatal                            hernia likely).                           - Normal stomach.                           - Normal examined duodenum. Recommendation:           - Return patient to hospital ward for ongoing care.                           - Soft diet today then advance as tolerated.                           - Use Protonix  (pantoprazole ) 40 mg PO BID for 4  weeks to promote mucosal healing of erosive                            esophagitis, then reduce to Protonix  40 mg daily                            for ongoing control of reflux..                           - Await pathology results.                           - Can resume Eliquis  tomorrow.                           - Inpatient GI service will sign off at this time.                            Please do not hesitate to contact us  with                            additional questions or concerns.                           - Follow-up with your primary Gastroenterologist at                            [facility] as an outpatient at                            appointment as scheduled. Procedure Code(s):        --- Professional ---                           [phone], Esophagogastroduodenoscopy, flexible,                            transoral; with transendoscopic balloon dilation of                             esophagus (less than 30 mm diameter)                           43239, 59, Esophagogastroduodenoscopy, flexible,                            transoral; with biopsy, single or multiple Diagnosis Code(s):        --- Professional ---                           K22.2, Esophageal obstruction                           K21.00, Gastro-esophageal reflux disease with  esophagitis, without bleeding                           K44.9, Diaphragmatic hernia without obstruction or                            gangrene                           R13.10, Dysphagia, unspecified CPT copyright 2022 American Medical Association. All rights reserved. The codes documented in this report are preliminary and upon coder review may  be revised to meet current compliance requirements. Sandor Flatter, MD 11/20/2023 9:43:21 AM Number of Addenda: 0

## 2023-11-20 NOTE — Anesthesia Postprocedure Evaluation (Signed)
 Anesthesia Post Note  Patient: Angie Carr  Procedure(s) Performed: EGD (ESOPHAGOGASTRODUODENOSCOPY)     Patient location during evaluation: PACU Anesthesia Type: MAC Level of consciousness: awake and alert Pain management: pain level controlled Vital Signs Assessment: post-procedure vital signs reviewed and stable Respiratory status: spontaneous breathing, nonlabored ventilation, respiratory function stable and patient connected to nasal cannula oxygen Cardiovascular status: stable and blood pressure returned to baseline Postop Assessment: no apparent nausea or vomiting Anesthetic complications: no   No notable events documented.  Last Vitals:  Vitals:   11/20/23 0950 11/20/23 1027  BP: 128/77 107/80  Pulse: 98 (!) 108  Resp: 18   Temp:    SpO2: 96% 100%    Last Pain:  Vitals:   11/20/23 0950  TempSrc:   PainSc: 0-No pain                 Cordella SQUIBB Delois Tolbert

## 2023-11-20 NOTE — Progress Notes (Signed)
 PT Cancellation Note  Patient Details Name: Angie Carr MRN: [id number] DOB: 10-Jan-1955   Cancelled Treatment:    Reason Eval/Treat Not Completed: Patient at procedure or test/unavailable (Pt in EGD. Will check back as time allows.)   [name] [name] 11/20/2023, 8:42 AM [name] M,PT Acute Rehab Services [phone]

## 2023-11-20 NOTE — Anesthesia Preprocedure Evaluation (Addendum)
 Anesthesia Evaluation  Patient identified by MRN, date of birth, ID band Patient awake    Reviewed: Allergy & Precautions, NPO status , Patient's Chart, lab work & pertinent test results  Airway Mallampati: I  TM Distance: >3 FB Neck ROM: Full    Dental no notable dental hx. (+) Missing,    Pulmonary asthma , Current Smoker and Patient abstained from smoking.   Pulmonary exam normal        Cardiovascular hypertension, + Peripheral Vascular Disease   Rhythm:Regular Rate:Normal     Neuro/Psych  Headaches  Anxiety        GI/Hepatic Neg liver ROS,GERD  Medicated,,Dysphagia    Endo/Other  diabetes, Type 2, Oral Hypoglycemic Agents    Renal/GU   negative genitourinary   Musculoskeletal  (+) Arthritis ,    Abdominal Normal abdominal exam  (+)   Peds  Hematology  (+) Blood dyscrasia, anemia Lab Results      Component                Value               Date                      WBC                      4.6                 11/14/2023                HGB                      9.3 (L)             11/14/2023                HCT                      26.4 (L)            11/14/2023                MCV                      92.3                11/14/2023                PLT                      133 (L)             11/14/2023             Lab Results      Component                Value               Date                      NA                       145                 11/18/2023                K  3.4 (L)             11/20/2023                CO2                      20 (L)              11/18/2023                GLUCOSE                  76                  11/18/2023                BUN                      13                  11/18/2023                CREATININE               0.78                11/18/2023                CALCIUM                   7.5 (L)             11/18/2023                GFRNONAA                  >60                 11/18/2023              Anesthesia Other Findings   Reproductive/Obstetrics                              Anesthesia Physical Anesthesia Plan  ASA: 3  Anesthesia Plan: MAC   Post-op Pain Management:    Induction: Intravenous  PONV Risk Score and Plan: 1 and Propofol  infusion and Treatment may vary due to age or medical condition  Airway Management Planned: Simple Face Mask and Nasal Cannula  Additional Equipment: None  Intra-op Plan:   Post-operative Plan:   Informed Consent:   Plan Discussed with:   Anesthesia Plan Comments:          Anesthesia Quick Evaluation

## 2023-11-20 NOTE — Transfer of Care (Signed)
 Immediate Anesthesia Transfer of Care Note  Patient: Angie Carr  Procedure(s) Performed: EGD (ESOPHAGOGASTRODUODENOSCOPY)  Patient Location: PACU  Anesthesia Type:MAC  Level of Consciousness: drowsy  Airway & Oxygen Therapy: Patient Spontanous Breathing and Patient connected to nasal cannula oxygen  Post-op Assessment: Report given to RN and Post -op Vital signs reviewed and stable  Post vital signs: Reviewed and stable  Last Vitals:  Vitals Value Taken Time  BP 93/63 11/20/23 09:30  Temp    Pulse 84 11/20/23 09:30  Resp 16 11/20/23 09:30  SpO2 99 % 11/20/23 09:30  Vitals shown include unfiled device data.  Last Pain:  Vitals:   11/20/23 0837  TempSrc: Temporal  PainSc: 0-No pain         Complications: No notable events documented.

## 2023-11-20 NOTE — TOC Progression Note (Signed)
 Transition of Care [facility]) - Progression Note    Patient Details  Name: Angie Carr MRN: [id number] Date of Birth: Nov 19, 1955  [facility] of [facility] CM/SW Contact  Lendia Dais, [place] Phone Number: 11/20/2023, 4:11 PM  Clinical Narrative: RN informed CSW that the pt was to go home with home health.   CSW spoke to the pt at bedside and the pt stated she doesn't feel like she needs SNF and wants to go home w/ HH. CSW explained that HH would come out 1-3 a week and the sessions would be 30 mins to an hour long and that family would have to be able to provide assistance the days  [facility] isn't there. Pt stated that her grandson Angie Carr would be able to help. CSW asked for permission to reach out to Angie Carr and pt was agreeable and gave the number to contact.  CSW spoke to Angie Carr via phone [phone]). CSW introduced self and role and explained the information above. Angie Carr stated it would be better for the pt to go to rehab because he works from 8:00-4:00 and would not be able to provide care for her. Angie Carr mentioned he is on the way to see the pt in the hospital now and will talk to the pt.    CSW will continue to monitor.    Expected Discharge Plan: Skilled Nursing Facility Barriers to Discharge: Continued Medical Work up               Expected Discharge Plan and Services In-house Referral: Clinical Social Work   Post Acute Care Choice: Skilled Nursing Facility Living arrangements for the past 2 months: Single Family Home                                       Social Drivers of Health (SDOH) Interventions SDOH Screenings   Food Insecurity: No Food Insecurity (11/14/2023)  Housing: Low Risk  (11/14/2023)  Transportation Needs: No Transportation Needs (11/14/2023)  Utilities: Not At Risk (11/14/2023)  Depression (PHQ2-9): Low Risk  (10/17/2023)  Financial Resource Strain: Low Risk  (11/04/2023)  Physical Activity: Inactive (11/04/2023)  Social Connections:  Moderately Integrated (11/14/2023)  Stress: No Stress Concern Present (11/04/2023)  Tobacco Use: High Risk (11/20/2023)  Health Literacy: Adequate Health Literacy (11/04/2023)    Readmission Risk Interventions    09/17/2023    3:57 PM 08/29/2023    2:12 PM  Readmission Risk Prevention Plan  Transportation Screening Complete Complete  PCP or Specialist Appt within 5-7 Days  Complete  Home Care Screening Complete Complete  Medication Review (RN CM) Complete Referral to Pharmacy

## 2023-11-20 NOTE — Interval H&P Note (Signed)
 History and Physical Interval Note:  11/20/2023 8:36 AM  Angie Carr  has presented today for surgery, with the diagnosis of dysphagia.  The various methods of treatment have been discussed with the patient and family. After consideration of risks, benefits and other options for treatment, the patient has consented to  Procedure(s) with comments: EGD (ESOPHAGOGASTRODUODENOSCOPY) (N/A) - with possible dilation as a surgical intervention.  The patient's history has been reviewed, patient examined, no change in status, stable for surgery.  I have reviewed the patient's chart and labs.  Questions were answered to the patient's satisfaction.     Sandor GAILS [name]

## 2023-11-20 NOTE — Progress Notes (Signed)
 PROGRESS NOTE  Angie Carr FMW:[id number] DOB: June 29, 1955 DOA: 11/13/2023 PCP: Benjamine Aland, MD   LOS: 6 days   Brief Narrative / Interim history: 68 year old female with history of hypertension, diabetes type 2, PE on Eliquis , peripheral artery disease, GERD, arthritis, anemia who presented with complaints of dizziness, weakness. She was recently hospitalized in September when she was found to have hypokalemia/hypomagnesemia. She was taking supplementation at home but recently discontinued by her PCP. On presentation, she was hemodynamically stable. Lab work showed potassium of 2.4, magnesium  of 0.5. EKG showed normal sinus rhythm with occasional PVCs/PACs. CTA chest study negative for PE but showed 4.1 cm ascending aortic aneurysm. Patient admitted for the management of severe deficiency of electrolytes. Currently being monitored and supplemented as needed. PT recommended SNF on discharge. GI/speech therapy consulted for persistent dysphagia, poor oral intake.   Subjective / 24h Interval events: She is doing well this morning, awaiting endoscopy.  Denies any nausea or vomiting.  No chest pain.  She tells me she has lost about 70 pounds due to poor p.o. intake  Assesement and Plan: Principal problem Hypokalemia/hypomagnesemia/hypophosphatemia - Presented with weakness, dizziness.  Was found to have significant electrolyte abnormalities due to poor oral intake - Replaced, values better, continue to replace and monitor daily   Active problems Poor oral intake/dysphagia - Patient has poor appetite, choking history with food.  Has not have significant oral intake since last 3 to 4 months.  Seen by GI as an outpatient, she has had an associated close to 70 pound weight loss.  She underwent an EGD 11/19 which showed a benign-appearing esophageal stenosis status post dilation - Advance diet  Vertigo - Complains of positional vertigo with room spinning around her.  Noticed to have nystagmus as  well.  Likely BPPV. status post Epley's maneuver. PT recommending SNF on discharge.  Started on meclizine.  No focal deficits. MRI did not show any acute findings.   Type 2 diabetes - Well-controlled.  Recent A1c of 4.8.  Recently taken off from metformin  by her PCP.  Continue sliding scale   Generalized weakness/dizziness/deconditioning - PT recommends SNF    History of PE -continue Eliquis    Ascending aortic aneurysm - CTA chest showed 4.1 cm ascending aortic aneurysm.  Needs annual follow-up imaging by CTA or MRA.  She needs to follow-up with  vascular surgery as an outpatient on discharge.   History of peripheral artery disease - Continue cilostazol    GERD - Continue PPI   Anxiety - On mirtazipine   Constipation - Continue bowel regimen  Scheduled Meds:  bisacodyl  10 mg Rectal Once   cilostazol   50 mg Oral BID   insulin  aspart  0-9 Units Subcutaneous TID WC   magnesium  oxide  800 mg Oral Daily   mirtazapine   7.5 mg Oral Daily   multivitamin with minerals  1 tablet Oral Daily   nutrition supplement (JUVEN)  1 packet Oral BID BM   pantoprazole   40 mg Oral Daily   polyethylene glycol  17 g Oral Daily   senna-docusate  1 tablet Oral BID   thiamine   100 mg Oral Daily   Continuous Infusions:  magnesium  sulfate bolus IVPB Stopped (11/20/23 0828)   promethazine (PHENERGAN) injection (IM or IVPB) Stopped (11/18/23 1152)   PRN Meds:.acetaminophen  **OR** acetaminophen , meclizine, mouth rinse, promethazine (PHENERGAN) injection (IM or IVPB)  Current Outpatient Medications  Medication Instructions   apixaban  (ELIQUIS ) 5 MG TABS tablet Take 2 tablets (10 mg total) by mouth 2 (two)  times daily for 7 days, THEN 1 tablet (5 mg total) 2 (two) times daily.   cilostazol  (PLETAL ) 50 mg, 2 times daily   metFORMIN  (GLUCOPHAGE ) 1,000 mg, Oral, 2 times daily with meals   mirtazapine  (REMERON ) 7.5 mg, Oral, Daily   omeprazole  (PRILOSEC) 20 mg, Oral, Daily   Potassium Chloride  ER 20 MEQ TBCR 20  mEq, Every morning   pravastatin  (PRAVACHOL ) 40 mg, Daily    DVT prophylaxis:    Lab Results  Component Value Date   PLT 133 (L) 11/14/2023      Code Status: Full Code  Family Communication: No family at bedside  Status is: Inpatient Remains inpatient appropriate because: Severity of illness  Level of care: Telemetry  Consultants:  GI  Objective: Vitals:   11/20/23 0930 11/20/23 0932 11/20/23 0950 11/20/23 1027  BP: 93/63 93/63 128/77 107/80  Pulse: 86 88 98 (!) 108  Resp: 15 15 18    Temp: (!) 96.6 F (35.9 C)     TempSrc: Temporal     SpO2: 99% 98% 96% 100%  Weight:      Height:        Intake/Output Summary (Last 24 hours) at 11/20/2023 1106 Last data filed at 11/19/2023 2030 Gross per 24 hour  Intake 480 ml  Output 240 ml  Net 240 ml   Wt Readings from Last 3 Encounters:  11/20/23 77.8 kg  09/27/23 88.9 kg  09/20/23 104.8 kg    Examination:  Constitutional: NAD Eyes: no scleral icterus ENMT: Mucous membranes are moist.  Neck: normal, supple Respiratory: clear to auscultation bilaterally, no wheezing, no crackles.  Cardiovascular: Regular rate and rhythm, no murmurs / rubs / gallops.  Abdomen: non distended, no tenderness. Bowel sounds positive.  Musculoskeletal: no clubbing / cyanosis.   Data Reviewed: I have independently reviewed following labs and imaging studies   CBC Recent Labs  Lab 11/13/23 1626 11/13/23 1807 11/14/23 0407 11/20/23 0859  WBC 5.1  --  4.6  --   HGB 11.5* 11.9* 9.3* 10.9*  HCT 34.0* 35.0* 26.4* 32.0*  PLT 164  --  133*  --   MCV 95.5  --  92.3  --   MCH 32.3  --  32.5  --   MCHC 33.8  --  35.2  --   RDW 17.4*  --  17.1*  --     Recent Labs  Lab 11/13/23 1626 11/13/23 1749 11/14/23 0407 11/15/23 0414 11/16/23 0450 11/18/23 0434 11/19/23 0406 11/20/23 0402 11/20/23 0859  NA 144   < > 142 138 140 145  --   --  145  K 3.3*   < > 4.2 3.8 3.8 4.5 3.1* 3.4* 3.2*  CL 107   < > 114* 111 111 114*  --   --  109   CO2 15*  --  14* 18* 18* 20*  --   --   --   GLUCOSE 81   < > 70 77 78 76  --   --  87  BUN 15   < > 13 9 7* 13  --   --  16  CREATININE 1.26*   < > 0.99 0.73 0.73 0.78  --   --  0.70  CALCIUM  6.9*  --  6.4* 6.6* 6.9* 7.5*  --   --   --   AST 23  --  13*  --   --   --   --   --   --   ALT 16  --  10  --   --   --   --   --   --   ALKPHOS 65  --  50  --   --   --   --   --   --   BILITOT 2.1*  --  1.4*  --   --   --   --   --   --   ALBUMIN 2.9*  --  2.1*  --   --   --   --   --   --   MG  --    < > 1.2* 1.3* 1.9 1.7 1.5* 1.6*  --    < > = values in this interval not displayed.    ------------------------------------------------------------------------------------------------------------------ No results for input(s): CHOL, HDL, LDLCALC, TRIG, CHOLHDL, LDLDIRECT in the last 72 hours.  Lab Results  Component Value Date   HGBA1C 4.8 07/19/2023   ------------------------------------------------------------------------------------------------------------------ No results for input(s): TSH, T4TOTAL, T3FREE, THYROIDAB in the last 72 hours.  Invalid input(s): FREET3  Cardiac Enzymes No results for input(s): CKMB, TROPONINI, MYOGLOBIN in the last 168 hours.  Invalid input(s): CK ------------------------------------------------------------------------------------------------------------------    Component Value Date/Time   BNP 80.0 12/11/2014 2344    CBG: Recent Labs  Lab 11/19/23 2027 11/20/23 0730 11/20/23 0942 11/20/23 1004 11/20/23 1026  GLUCAP 100* 73 68* 105* 79    Recent Results (from the past 240 hours)  Resp panel by RT-PCR (RSV, Flu A&B, Covid) Anterior Nasal Swab     Status: None   Collection Time: 11/13/23  6:05 PM   Specimen: Anterior Nasal Swab  Result Value Ref Range Status   SARS Coronavirus 2 by RT PCR NEGATIVE NEGATIVE Final   Influenza A by PCR NEGATIVE NEGATIVE Final   Influenza B by PCR NEGATIVE NEGATIVE Final     Comment: (NOTE) The Xpert Xpress SARS-CoV-2/FLU/RSV plus assay is intended as an aid in the diagnosis of influenza from Nasopharyngeal swab specimens and should not be used as a sole basis for treatment. Nasal washings and aspirates are unacceptable for Xpert Xpress SARS-CoV-2/FLU/RSV testing.  Fact Sheet for Patients: bloggercourse.com  Fact Sheet for Healthcare Providers: seriousbroker.it  This test is not yet approved or cleared by the [place]  FDA and has been authorized for detection and/or diagnosis of SARS-CoV-2 by FDA under an Emergency Use Authorization (EUA). This EUA will remain in effect (meaning this test can be used) for the duration of the COVID-19 declaration under Section 564(b)(1) of the Act, 21 U.S.C. section 360bbb-3(b)(1), unless the authorization is terminated or revoked.     Resp Syncytial Virus by PCR NEGATIVE NEGATIVE Final    Comment: (NOTE) Fact Sheet for Patients: bloggercourse.com  Fact Sheet for Healthcare Providers: seriousbroker.it  This test is not yet approved or cleared by the [place]  FDA and has been authorized for detection and/or diagnosis of SARS-CoV-2 by FDA under an Emergency Use Authorization (EUA). This EUA will remain in effect (meaning this test can be used) for the duration of the COVID-19 declaration under Section 564(b)(1) of the Act, 21 U.S.C. section 360bbb-3(b)(1), unless the authorization is terminated or revoked.  Performed at [facility], [address], [place], [place] [address]      Radiology Studies: No results found.   Nilda Fendt, MD, PhD [facility]  Between 7 am - 7 pm I am available, please contact me via Amion (for emergencies) or Securechat (non urgent messages)  Between 7 pm - 7 am I am not available, please  contact night coverage MD/APP via Amion

## 2023-11-21 ENCOUNTER — Other Ambulatory Visit (HOSPITAL_COMMUNITY): Payer: Self-pay

## 2023-11-21 ENCOUNTER — Encounter (HOSPITAL_COMMUNITY): Payer: Self-pay | Admitting: Gastroenterology

## 2023-11-21 DIAGNOSIS — E876 Hypokalemia: Secondary | ICD-10-CM | POA: Diagnosis not present

## 2023-11-21 LAB — GASTROINTESTINAL PANEL BY PCR, STOOL (REPLACES STOOL CULTURE)

## 2023-11-21 LAB — GLUCOSE, CAPILLARY
Glucose-Capillary: 71 mg/dL (ref 70–99)
Glucose-Capillary: 75 mg/dL (ref 70–99)

## 2023-11-21 LAB — PHOSPHORUS: Phosphorus: 4.1 mg/dL (ref 2.5–4.6)

## 2023-11-21 LAB — POTASSIUM: Potassium: 3.2 mmol/L — ABNORMAL LOW (ref 3.5–5.1)

## 2023-11-21 LAB — MAGNESIUM: Magnesium: 1.8 mg/dL (ref 1.7–2.4)

## 2023-11-21 LAB — SURGICAL PATHOLOGY

## 2023-11-21 MED ORDER — MAGNESIUM OXIDE -MG SUPPLEMENT 400 (240 MG) MG PO TABS
800.0000 mg | ORAL_TABLET | Freq: Every day | ORAL | 0 refills | Status: DC
  Filled 2023-11-21: qty 90, 45d supply, fill #0

## 2023-11-21 MED ORDER — POTASSIUM CHLORIDE CRYS ER 20 MEQ PO TBCR
20.0000 meq | EXTENDED_RELEASE_TABLET | Freq: Every morning | ORAL | 0 refills | Status: DC
  Filled 2023-11-21: qty 30, 30d supply, fill #0

## 2023-11-21 MED ORDER — POTASSIUM CHLORIDE CRYS ER 20 MEQ PO TBCR
40.0000 meq | EXTENDED_RELEASE_TABLET | ORAL | Status: AC
  Administered 2023-11-21 (×2): 40 meq via ORAL
  Filled 2023-11-21 (×2): qty 2

## 2023-11-21 NOTE — Progress Notes (Signed)
    Durable Medical Equipment  (From admission, onward)           Start     Ordered   11/21/23 1045  For home use only DME Bedside commode  Once       Comments: Patient is confined to one room, has Generalized Weakness and Decreased Activity Tolerance which necessitate recommendation for bedside commode.  Question:  Patient needs a bedside commode to treat with the following condition  Answer:  Weakness   11/21/23 1045

## 2023-11-21 NOTE — Progress Notes (Signed)
 Physical Therapy Treatment Patient Details Name: Angie Carr MRN: [id number] DOB: 07-Oct-1955 Today's Date: 11/21/2023   History of Present Illness Pt is a 68 y.o. female who presented 11/10/23 due to dizziness and weakness. Pt was admitted due to hypokalemia. PMHx: DM2 , HTN, peripheral vascular disease, PE , anxiety asthma,  hypertension, neuromuscular disorder, COVID, DJD    PT Comments  Patient progressing to ambulation in the room though in process of discharging home.  Patient reported dizziness was better though noted HR in 140's and pt with some dyspnea with mobility.  She needed upt o mod A of 2 for sit to stand from a low surface, though reports grandson and grandaughter will assist her at home.  Patient will benefit from follow up HHPT as she declined inpatient rehab.    If plan is discharge home, recommend the following: A lot of help with walking and/or transfers;A lot of help with bathing/dressing/bathroom;Assist for transportation;Help with stairs or ramp for entrance   Can travel by private vehicle        Equipment Recommendations  BSC/3in1    Recommendations for Other Services       Precautions / Restrictions Precautions Precautions: Fall Recall of Precautions/Restrictions: Intact     Mobility  Bed Mobility Overal bed mobility: Needs Assistance Bed Mobility: Sit to Supine     Supine to sit: Contact guard, Used rails, Min assist Sit to supine: Mod assist   General bed mobility comments: NT in the room helping pt get dressed for d/c.  Pt laying diagonally across bed and NT helped legs further over, cues for pt to use railings and the push up with arms; CGA for balance; lifted her legs onto stretcher when transport arrived to take pt home    Transfers Overall transfer level: Needs assistance Equipment used: Rolling walker (2 wheels) Transfers: Sit to/from Stand Sit to Stand: Min assist, From elevated surface, Mod assist, +2 physical assistance            General transfer comment: from elevated bed with cues for hand placement, increased time to bend knees pt able to stand with min A from low couch pt needing +2 mod A due to fatigue, HR to 140 and need to ambulate    Ambulation/Gait Ambulation/Gait assistance: Contact guard assist Gait Distance (Feet): 12 Feet Assistive device: Rolling walker (2 wheels) Gait Pattern/deviations: Step-through pattern, Decreased stride length, Wide base of support       General Gait Details: increased BOS, CGA for balance and safety with HR to 140; transport had arrived to take pt home so walked to stretcher   Stairs             Wheelchair Mobility     Tilt Bed    Modified Rankin (Stroke Patients Only)       Balance Overall balance assessment: Needs assistance   Sitting balance-Leahy Scale: Poor Sitting balance - Comments: leans back seated on EOB and holds rail for support, on couch pt leaning back and pulling forward on walker Postural control: Posterior lean Standing balance support: Bilateral upper extremity supported Standing balance-Leahy Scale: Poor Standing balance comment: reliant on BUE support                            Communication Communication Communication: No apparent difficulties  Cognition Arousal: Alert Behavior During Therapy: [facility] for tasks assessed/performed  Following commands: Intact      Cueing Cueing Techniques: Verbal cues  Exercises      General Comments General comments (skin integrity, edema, etc.): reports improved dizziness and denied any dizziness during session though did report SOB; SpO2 on RA 100%; HR to 130-140 with mobility; BP seated 148/133; standing 131/90      Pertinent Vitals/Pain Pain Assessment Pain Assessment: No/denies pain    Home Living                          Prior Function            PT Goals (current goals can now be found in the care plan section)  Progress towards PT goals: Progressing toward goals    Frequency    Min 2X/week      PT Plan      Co-evaluation              AM-PAC PT 6 Clicks Mobility   Outcome Measure  Help needed turning from your back to your side while in a flat bed without using bedrails?: A Lot Help needed moving from lying on your back to sitting on the side of a flat bed without using bedrails?: A Lot Help needed moving to and from a bed to a chair (including a wheelchair)?: A Little Help needed standing up from a chair using your arms (e.g., wheelchair or bedside chair)?: A Lot Help needed to walk in hospital room?: A Little Help needed climbing 3-5 steps with a railing? : Total 6 Click Score: 13    End of Session Equipment Utilized During Treatment: Gait belt Activity Tolerance: Patient limited by fatigue Patient left: Other (comment) (on stretcher for EMS transport)   PT Visit Diagnosis: Unsteadiness on feet (R26.81);Other abnormalities of gait and mobility (R26.89);Muscle weakness (generalized) (M62.81);Difficulty in walking, not elsewhere classified (R26.2);BPPV;Dizziness and giddiness (R42)     Time: 8796-8766 PT Time Calculation (min) (ACUTE ONLY): 30 min  Charges:    $Gait Training: 8-22 mins $Therapeutic Activity: 8-22 mins PT General Charges $$ ACUTE PT VISIT: 1 Visit                     Micheline Portal, PT Acute Rehabilitation Services Office:[phone] 11/21/2023    Montie Portal 11/21/2023, 2:06 PM

## 2023-11-21 NOTE — TOC Transition Note (Addendum)
 Transition of Care [facility]) - Discharge Note   Patient Details  Name: Angie Carr MRN: [id number] Date of Birth: [birth year]  Transition of [facility]) CM/SW Contact:  Tom-Johnson, Harvest Muskrat, RN Phone Number: 11/21/2023, 10:35 AM   Clinical Narrative:     Patient is scheduled for discharge today.  Readmission Risk Assessment done. Home health info,hospital f/u and discharge instructions on AVS. BSC ordered from [employer] and will deliver to patient's home. Prescriptions sent to [facility] pharmacy and patient will receive meds prior discharge. PTAR scheduled to transport at discharge.  No further ICM needs noted.        Final next level of care: Home w Home Health Services Barriers to Discharge: Barriers Resolved   Patient Goals and CMS Choice Patient states their goals for this hospitalization and ongoing recovery are:: To return home CMS Medicare.gov Compare Post Acute Care list provided to:: Patient Choice offered to / list presented to : Patient      Discharge Placement                Patient to be transferred to facility by: PTAR      Discharge Plan and Services Additional resources added to the After Visit Summary for   In-house Referral: Clinical Social Work   Post Acute Care Choice: Skilled Nursing Facility                    HH Arranged: PT, OT, RN, Disease Management HH Agency: [facility] Date [facility] Agency Contacted: 11/21/23 Time HH Agency Contacted: 1030 Representative spoke with at [facility] Agency: Amy  Social Drivers of Health (SDOH) Interventions SDOH Screenings   Food Insecurity: No Food Insecurity (11/14/2023)  Housing: Low Risk  (11/14/2023)  Transportation Needs: No Transportation Needs (11/14/2023)  Utilities: Not At Risk (11/14/2023)  Depression (PHQ2-9): Low Risk  (10/17/2023)  Financial Resource Strain: Low Risk  (11/04/2023)  Physical Activity: Inactive (11/04/2023)  Social Connections: Moderately Integrated (11/14/2023)   Stress: No Stress Concern Present (11/04/2023)  Tobacco Use: High Risk (11/20/2023)  Health Literacy: Adequate Health Literacy (11/04/2023)     Readmission Risk Interventions    11/21/2023   10:33 AM 09/17/2023    3:57 PM 08/29/2023    2:12 PM  Readmission Risk Prevention Plan  Transportation Screening Complete Complete Complete  PCP or Specialist Appt within 5-7 Days Complete  Complete  Home Care Screening Complete Complete Complete  Medication Review (RN CM) Referral to Pharmacy Complete Referral to Pharmacy

## 2023-11-21 NOTE — TOC Progression Note (Signed)
 Transition of Care [facility]) - Progression Note    Patient Details  Name: Angie Carr MRN: [id number] Date of Birth: [birth year]  [facility]) CM/SW Contact  Gwenn Frieze [place], [place] Phone Number: 11/21/2023, 10:17 AM  Clinical Narrative: Spoke with pt and pt's sister via 3-way call re PT rec for SNF. Pt continues to refuse SNF and plans to dc home with [facility] and family support. Pt's sister Angie Carr is in agreement with pt returning home. Pt requesting PTAR home as she is not able to arrange [facility] transport today. Pt states she has no DME needs. CM aware of HH needs. SW signing off but available if pt becomes agreeable to SNF.   Frieze Gwenn, MSW, LCSW [phone] (coverage)      Expected Discharge Plan: Skilled Nursing Facility Barriers to Discharge: Continued Medical Work up               Expected Discharge Plan and Services In-house Referral: Clinical Social Work   Post Acute Care Choice: Skilled Nursing Facility Living arrangements for the past 2 months: Single Family Home Expected Discharge Date: 11/21/23                                     Social Drivers of Health (SDOH) Interventions SDOH Screenings   Food Insecurity: No Food Insecurity (11/14/2023)  Housing: Low Risk  (11/14/2023)  Transportation Needs: No Transportation Needs (11/14/2023)  Utilities: Not At Risk (11/14/2023)  Depression (PHQ2-9): Low Risk  (10/17/2023)  Financial Resource Strain: Low Risk  (11/04/2023)  Physical Activity: Inactive (11/04/2023)  Social Connections: Moderately Integrated (11/14/2023)  Stress: No Stress Concern Present (11/04/2023)  Tobacco Use: High Risk (11/20/2023)  Health Literacy: Adequate Health Literacy (11/04/2023)    Readmission Risk Interventions    09/17/2023    3:57 PM 08/29/2023    2:12 PM  Readmission Risk Prevention Plan  Transportation Screening Complete Complete  PCP or Specialist Appt within 5-7 Days  Complete  Home Care Screening  Complete Complete  Medication Review (RN CM) Complete Referral to Pharmacy

## 2023-11-21 NOTE — Discharge Summary (Signed)
 Physician Discharge Summary  Angie Carr FMW:[id number] DOB: [birth year] DOA: 11/13/2023  PCP: Benjamine Aland, MD  Admit date: 11/13/2023 Discharge date: 11/21/2023  Admitted From: home Disposition:  home (refusing SNF)  Recommendations for Outpatient Follow-up:  Follow up with PCP in 1-2 weeks Please obtain BMP/CBC in one week  Home Health: PT Equipment/Devices: none  Discharge Condition: stable CODE STATUS: Full code Diet Orders (From admission, onward)     Start     Ordered   11/20/23 1213  DIET SOFT Room service appropriate? Yes; Fluid consistency: Thin  Diet effective now       Question Answer Comment  Room service appropriate? Yes   Fluid consistency: Thin      11/20/23 1212            Brief Narrative / Interim history: 68 year old female with history of hypertension, diabetes type 2, PE on Eliquis , peripheral artery disease, GERD, arthritis, anemia who presented with complaints of dizziness, weakness. She was recently hospitalized in September when she was found to have hypokalemia/hypomagnesemia. She was taking supplementation at home but recently discontinued by her PCP. On presentation, she was hemodynamically stable. Lab work showed potassium of 2.4, magnesium  of 0.5. EKG showed normal sinus rhythm with occasional PVCs/PACs. CTA chest study negative for PE but showed 4.1 cm ascending aortic aneurysm. Patient admitted for the management of severe deficiency of electrolytes. Currently being monitored and supplemented as needed. PT recommended SNF on discharge. GI/speech therapy consulted for persistent dysphagia, poor oral intake.   Hospital Course / Discharge diagnoses: Principal problem Hypokalemia/hypomagnesemia/hypophosphatemia - Presented with weakness, dizziness.  Was found to have significant electrolyte abnormalities due to poor oral intake.  Lites were replaced, improved, she will be placed back again on potassium as well as magnesium  upon discharge.   She was advised to follow-up with PCP within a week for repeat blood work   Active problems Poor oral intake/dysphagia - Patient has poor appetite, choking history with food.  Has not have significant oral intake since last 3 to 4 months.  Seen by GI as an outpatient, she has had an associated close to 70 pound weight loss.  She underwent an EGD 11/19 which showed a benign-appearing esophageal stenosis status post dilation.  She is tolerating a regular diet much better, no further issues at this point.  Vertigo - Complains of positional vertigo with room spinning around her.  Noticed to have nystagmus as well.  Likely BPPV. status post Epley's maneuver. PT recommending SNF on discharge, however she is refusing.  She will go home with home health.  MRI without acute findings  Type 2 diabetes - Well-controlled.  Recent A1c of 4.8.  Recently taken off from metformin  by her PCP. Generalized weakness/dizziness/deconditioning - PT recommends SNF  History of PE -continue Eliquis  Ascending aortic aneurysm - CTA chest showed 4.1 cm ascending aortic aneurysm.  Needs annual follow-up imaging by CTA or MRA.  She needs to follow-up with  vascular surgery as an outpatient on discharge. History of peripheral artery disease - Continue cilostazol  GERD - Continue PPI Anxiety - On mirtazipine Constipation - Continue bowel regimen  Sepsis ruled out   Discharge Instructions   Allergies as of 11/21/2023   No Known Allergies      Medication List     STOP taking these medications    metFORMIN  1000 MG tablet Commonly known as: GLUCOPHAGE        TAKE these medications    cilostazol  50 MG tablet Commonly known as: PLETAL   Take 50 mg by mouth 2 (two) times daily.   Eliquis  5 MG Tabs tablet Generic drug: apixaban  Take 2 tablets (10 mg total) by mouth 2 (two) times daily for 7 days, THEN 1 tablet (5 mg total) 2 (two) times daily. Start taking on: August 30, 2023 What changed: See the new  instructions.   magnesium  oxide 400 (240 Mg) MG tablet Commonly known as: MAG-OX Take 2 tablets (800 mg total) by mouth daily. Start taking on: November 22, 2023   mirtazapine  7.5 MG tablet Commonly known as: REMERON  Take 7.5 mg by mouth daily.   omeprazole  20 MG capsule Commonly known as: PRILOSEC Take 20 mg by mouth daily.   Potassium Chloride  ER 20 MEQ Tbcr Take 1 tablet (20 mEq total) by mouth in the morning.   pravastatin  40 MG tablet Commonly known as: PRAVACHOL  Take 40 mg by mouth daily in the afternoon.        Follow-up Information     Benjamine Aland, MD Follow up in 1 week(s).   Specialty: Family Medicine Contact information: [address] [place], [address] [place] [address] [phone]                Consultations: Gastroenterology  Procedures/Studies:  MR BRAIN WO CONTRAST Result Date: 11/18/2023 EXAM: MRI BRAIN WITHOUT CONTRAST 11/18/2023 11:04:21 AM TECHNIQUE: Multiplanar multisequence MRI of the head/brain was performed without the administration of intravenous contrast. COMPARISON: None available. CLINICAL HISTORY: Syncope/presyncope, cerebrovascular cause suspected. FINDINGS: BRAIN AND VENTRICLES: There is no evidence of an acute infarct, intracranial hemorrhage, mass, midline shift, hydrocephalus, or extra-axial fluid collection. T2 hyperintensities in the cerebral white matter bilaterally are nonspecific but compatible with mild chronic small vessel ischemic disease. 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 There is moderately advanced cerebral and cerebellar atrophy. Major intracranial vascular flow voids are preserved. ORBITS: No acute abnormality. SINUSES AND MASTOIDS: Large right and trace left mastoid effusions. Clear paranasal sinuses. BONES AND SOFT TISSUES: Normal marrow signal. No acute soft tissue abnormality. IMPRESSION: 1. No acute intracranial abnormality. 2. Moderately advanced atrophy. 3. Mild chronic small vessel  ischemic disease. 4. Large right mastoid effusion. Electronically signed by: Dasie Hamburg MD 11/18/2023 11:15 AM EST RP Workstation: [id number]   CT Angio Chest PE W and/or Wo Contrast Result Date: 11/13/2023 CLINICAL DATA:  High probability for PE.  Acute abdominal pain. EXAM: CT ANGIOGRAPHY CHEST CT ABDOMEN AND PELVIS WITH CONTRAST TECHNIQUE: Multidetector CT imaging of the chest was performed using the standard protocol during bolus administration of intravenous contrast. Multiplanar CT image reconstructions and MIPs were obtained to evaluate the vascular anatomy. Multidetector CT imaging of the abdomen and pelvis was performed using the standard protocol during bolus administration of intravenous contrast. RADIATION DOSE REDUCTION: This exam was performed according to the departmental dose-optimization program which includes automated exposure control, adjustment of the mA and/or kV according to patient size and/or use of iterative reconstruction technique. CONTRAST:  75mL OMNIPAQUE  IOHEXOL  350 MG/ML SOLN COMPARISON:  CT abdomen and pelvis 09/15/2014. CT angiogram chest 08/28/2023. FINDINGS: CTA CHEST FINDINGS Cardiovascular: The ascending aorta is dilated measuring 4.1 cm. There are atherosclerotic calcifications of the aorta and coronary arteries. The heart is mildly enlarged. There is no pericardial effusion. There is adequate opacification of the pulmonary arteries to the segmental level. The main pulmonary artery is dilated, unchanged. No pulmonary embolism identified. Mediastinum/Nodes: No enlarged mediastinal, hilar, or axillary lymph nodes. Thyroid gland, trachea, and esophagus demonstrate no significant findings. Lungs/Pleura: Mild emphysema present. The  lungs are otherwise clear. No pleural effusion or pneumothorax. Musculoskeletal: No chest wall abnormality. No acute or significant osseous findings. Review of the MIP images confirms the above findings. CT ABDOMEN and PELVIS FINDINGS Hepatobiliary:  There is fatty infiltration of the liver. The gallbladder surgically absent. There is no biliary ductal dilatation. Pancreas: Unremarkable. No pancreatic ductal dilatation or surrounding inflammatory changes. Spleen: Normal in size without focal abnormality. Adrenals/Urinary Tract: The bladder is grossly within normal limits, but evaluation is limited secondary to streak artifact in the pelvis. The bilateral kidneys adrenal glands appear normal. Stomach/Bowel: Stomach is within normal limits. No evidence of bowel wall thickening, distention, or inflammatory changes. There are scattered colonic diverticula. The appendix is not visualized. Vascular/Lymphatic: Aortic atherosclerosis. No enlarged abdominal or pelvic lymph nodes. Reproductive: Status post hysterectomy. No adnexal masses. Other: Of the level of the umbilicus there is a fat containing ventral hernia which is small in size. In the upper abdomen there is a small midline ventral hernia containing nondilated bowel loops. Appearance is similar to prior. There is no ascites. Musculoskeletal: Left hip arthroplasty is present. There severe degenerative changes of the right hip. Review of the MIP images confirms the above findings. IMPRESSION: 1. No evidence for pulmonary embolism. 2. Stable dilatation of the main pulmonary artery compatible with pulmonary arterial hypertension. 3. 4.1 cm ascending aortic aneurysm. Recommend annual imaging followup by CTA or MRA. This recommendation follows 2010 ACCF/AHA/AATS/ACR/ASA/SCA/SCAI/SIR/STS/SVM Guidelines for the Diagnosis and Management of Patients with Thoracic Aortic Disease. Circulation. 2010; 121: Z733-z630. Aortic aneurysm NOS (ICD10-I71.9) 4. No acute localizing process in the abdomen or pelvis. 5. Colonic diverticulosis. 6. Fatty infiltration of the liver. 7. Stable ventral hernias. Aortic Atherosclerosis (ICD10-I70.0). Electronically Signed   By: Greig Pique M.D.   On: 11/13/2023 19:09   CT ABDOMEN PELVIS W  CONTRAST Result Date: 11/13/2023 CLINICAL DATA:  High probability for PE.  Acute abdominal pain. EXAM: CT ANGIOGRAPHY CHEST CT ABDOMEN AND PELVIS WITH CONTRAST TECHNIQUE: Multidetector CT imaging of the chest was performed using the standard protocol during bolus administration of intravenous contrast. Multiplanar CT image reconstructions and MIPs were obtained to evaluate the vascular anatomy. Multidetector CT imaging of the abdomen and pelvis was performed using the standard protocol during bolus administration of intravenous contrast. RADIATION DOSE REDUCTION: This exam was performed according to the departmental dose-optimization program which includes automated exposure control, adjustment of the mA and/or kV according to patient size and/or use of iterative reconstruction technique. CONTRAST:  75mL OMNIPAQUE  IOHEXOL  350 MG/ML SOLN COMPARISON:  CT abdomen and pelvis 09/15/2014. CT angiogram chest 08/28/2023. FINDINGS: CTA CHEST FINDINGS Cardiovascular: The ascending aorta is dilated measuring 4.1 cm. There are atherosclerotic calcifications of the aorta and coronary arteries. The heart is mildly enlarged. There is no pericardial effusion. There is adequate opacification of the pulmonary arteries to the segmental level. The main pulmonary artery is dilated, unchanged. No pulmonary embolism identified. Mediastinum/Nodes: No enlarged mediastinal, hilar, or axillary lymph nodes. Thyroid gland, trachea, and esophagus demonstrate no significant findings. Lungs/Pleura: Mild emphysema present. The lungs are otherwise clear. No pleural effusion or pneumothorax. Musculoskeletal: No chest wall abnormality. No acute or significant osseous findings. Review of the MIP images confirms the above findings. CT ABDOMEN and PELVIS FINDINGS Hepatobiliary: There is fatty infiltration of the liver. The gallbladder surgically absent. There is no biliary ductal dilatation. Pancreas: Unremarkable. No pancreatic ductal dilatation or  surrounding inflammatory changes. Spleen: Normal in size without focal abnormality. Adrenals/Urinary Tract: The bladder is grossly within normal limits,  but evaluation is limited secondary to streak artifact in the pelvis. The bilateral kidneys adrenal glands appear normal. Stomach/Bowel: Stomach is within normal limits. No evidence of bowel wall thickening, distention, or inflammatory changes. There are scattered colonic diverticula. The appendix is not visualized. Vascular/Lymphatic: Aortic atherosclerosis. No enlarged abdominal or pelvic lymph nodes. Reproductive: Status post hysterectomy. No adnexal masses. Other: Of the level of the umbilicus there is a fat containing ventral hernia which is small in size. In the upper abdomen there is a small midline ventral hernia containing nondilated bowel loops. Appearance is similar to prior. There is no ascites. Musculoskeletal: Left hip arthroplasty is present. There severe degenerative changes of the right hip. Review of the MIP images confirms the above findings. IMPRESSION: 1. No evidence for pulmonary embolism. 2. Stable dilatation of the main pulmonary artery compatible with pulmonary arterial hypertension. 3. 4.1 cm ascending aortic aneurysm. Recommend annual imaging followup by CTA or MRA. This recommendation follows 2010 ACCF/AHA/AATS/ACR/ASA/SCA/SCAI/SIR/STS/SVM Guidelines for the Diagnosis and Management of Patients with Thoracic Aortic Disease. Circulation. 2010; 121: Z733-z630. Aortic aneurysm NOS (ICD10-I71.9) 4. No acute localizing process in the abdomen or pelvis. 5. Colonic diverticulosis. 6. Fatty infiltration of the liver. 7. Stable ventral hernias. Aortic Atherosclerosis (ICD10-I70.0). Electronically Signed   By: Greig Pique M.D.   On: 11/13/2023 19:09     Subjective: - no chest pain, shortness of breath, no abdominal pain, nausea or vomiting.   Discharge Exam: BP 119/75 (BP Location: Left Arm)   Pulse 86   Temp 98.4 F (36.9 C)   Resp  17   Ht 5' 4 (1.626 m)   Wt 77.8 kg   SpO2 99%   BMI 29.44 kg/m   General: Pt is alert, awake, not in acute distress Cardiovascular: RRR, S1/S2 +, no rubs, no gallops Respiratory: CTA bilaterally, no wheezing, no rhonchi Abdominal: Soft, NT, ND, bowel sounds + Extremities: no edema, no cyanosis    The results of significant diagnostics from this hospitalization (including imaging, microbiology, ancillary and laboratory) are listed below for reference.     Microbiology: Recent Results (from the past 240 hours)  Resp panel by RT-PCR (RSV, Flu A&B, Covid)     Status: None   Collection Time: 11/13/23  6:05 PM   Specimen: Nasal Swab  Result Value Ref Range Status   SARS Coronavirus 2 by RT PCR NEGATIVE NEGATIVE Final   Influenza A by PCR NEGATIVE NEGATIVE Final   Influenza B by PCR NEGATIVE NEGATIVE Final    Comment: (NOTE) The Xpert Xpress SARS-CoV-2/FLU/RSV plus assay is intended as an aid in the diagnosis of influenza from Nasopharyngeal swab specimens and should not be used as a sole basis for treatment. Nasal washings and aspirates are unacceptable for Xpert Xpress SARS-CoV-2/FLU/RSV testing.  Fact Sheet for Patients: bloggercourse.com  Fact Sheet for Healthcare Providers: seriousbroker.it  This test is not yet approved or cleared by the [place]  FDA and has been authorized for detection and/or diagnosis of SARS-CoV-2 by FDA under an Emergency Use Authorization (EUA). This EUA will remain in effect (meaning this test can be used) for the duration of the COVID-19 declaration under Section 564(b)(1) of the Act, 21 U.S.C. section 360bbb-3(b)(1), unless the authorization is terminated or revoked.     Resp Syncytial Virus by PCR NEGATIVE NEGATIVE Final    Comment: (NOTE) Fact Sheet for Patients: bloggercourse.com  Fact Sheet for Healthcare  Providers: seriousbroker.it  This test is not yet approved or cleared by the [place]  FDA and has  been authorized for detection and/or diagnosis of SARS-CoV-2 by FDA under an Emergency Use Authorization (EUA). This EUA will remain in effect (meaning this test can be used) for the duration of the COVID-19 declaration under Section 564(b)(1) of the Act, 21 U.S.C. section 360bbb-3(b)(1), unless the authorization is terminated or revoked.  Performed at [facility], [address] [place], [place] [address]      Labs: Basic Metabolic Panel: Recent Labs  Lab 11/15/23 0414 11/16/23 0450 11/18/23 0434 11/19/23 0406 11/20/23 0402 11/20/23 0859 11/21/23 0346  NA 138 140 145  --   --  145  --   K 3.8 3.8 4.5 3.1* 3.4* 3.2* 3.2*  CL 111 111 114*  --   --  109  --   CO2 18* 18* 20*  --   --   --   --   GLUCOSE 77 78 76  --   --  87  --   BUN 9 7* 13  --   --  16  --   CREATININE 0.73 0.73 0.78  --   --  0.70  --   CALCIUM  6.6* 6.9* 7.5*  --   --   --   --   MG 1.3* 1.9 1.7 1.5* 1.6*  --  1.8  PHOS 2.6 2.5 2.4* 3.4 4.8*  --  4.1   Liver Function Tests: No results for input(s): AST, ALT, ALKPHOS, BILITOT, PROT, ALBUMIN in the last 168 hours. CBC: Recent Labs  Lab 11/20/23 0859  HGB 10.9*  HCT 32.0*   CBG: Recent Labs  Lab 11/20/23 1004 11/20/23 1026 11/20/23 1629 11/20/23 2108 11/21/23 0738  GLUCAP 105* 79 84 73 71   Hgb A1c No results for input(s): HGBA1C in the last 72 hours. Lipid Profile No results for input(s): CHOL, HDL, LDLCALC, TRIG, CHOLHDL, LDLDIRECT in the last 72 hours. Thyroid  function studies No results for input(s): TSH, T4TOTAL, T3FREE, THYROIDAB in the last 72 hours.  Invalid input(s): FREET3 Urinalysis    Component Value Date/Time   COLORURINE AMBER (A) 09/15/2023 2249   APPEARANCEUR HAZY (A) 09/15/2023 2249   LABSPEC 1.027 09/15/2023 2249   PHURINE 6.0 09/15/2023 2249    GLUCOSEU NEGATIVE 09/15/2023 2249   HGBUR MODERATE (A) 09/15/2023 2249   BILIRUBINUR SMALL (A) 09/15/2023 2249   KETONESUR 80 (A) 09/15/2023 2249   PROTEINUR 100 (A) 09/15/2023 2249   NITRITE NEGATIVE 09/15/2023 2249   LEUKOCYTESUR TRACE (A) 09/15/2023 2249    FURTHER DISCHARGE INSTRUCTIONS:   Get Medicines reviewed and adjusted: Please take all your medications with you for your next visit with your Primary MD   Laboratory/radiological data: Please request your Primary MD to go over all hospital tests and procedure/radiological results at the follow up, please ask your Primary MD to get all Hospital records sent to his/her office.   In some cases, they will be blood work, cultures and biopsy results pending at the time of your discharge. Please request that your primary care M.D. goes through all the records of your hospital data and follows up on these results.   Also Note the following: If you experience worsening of your admission symptoms, develop shortness of breath, life threatening emergency, suicidal or homicidal thoughts you must seek medical attention immediately by calling 911 or calling your MD immediately  if symptoms less severe.   You must read complete instructions/literature along with all the possible adverse reactions/side effects for all the Medicines you take and that have been prescribed to  you. Take any new Medicines after you have completely understood and accpet all the possible adverse reactions/side effects.    Do not drive when taking Pain medications or sleeping medications (Benzodaizepines)   Do not take more than prescribed Pain, Sleep and Anxiety Medications. It is not advisable to combine anxiety,sleep and pain medications without talking with your primary care practitioner   Special Instructions: If you have smoked or chewed Tobacco  in the last 2 yrs please stop smoking, stop any regular Alcohol   and or any Recreational drug use.   Wear Seat belts  while driving.   Please note: You were cared for by a hospitalist during your hospital stay. Once you are discharged, your primary care physician will handle any further medical issues. Please note that NO REFILLS for any discharge medications will be authorized once you are discharged, as it is imperative that you return to your primary care physician (or establish a relationship with a primary care physician if you do not have one) for your post hospital discharge needs so that they can reassess your need for medications and monitor your lab values.  Time coordinating discharge: 35 minutes  SIGNED:  Nilda Fendt, MD, PhD 11/21/2023, 10:11 AM

## 2023-11-22 ENCOUNTER — Telehealth: Payer: Self-pay | Admitting: *Deleted

## 2023-11-22 ENCOUNTER — Ambulatory Visit: Payer: Self-pay | Admitting: Gastroenterology

## 2023-11-22 MED ORDER — FLUCONAZOLE 100 MG PO TABS: ORAL_TABLET | ORAL | 0 refills | Status: AC

## 2023-11-22 NOTE — Transitions of Care (Post Inpatient/ED Visit) (Signed)
   11/22/2023  Name: Angie Carr MRN: [id number] DOB: October 24, 1955  Today's TOC FU Call Status: Today's TOC FU Call Status:: Unsuccessful Call (1st Attempt) Unsuccessful Call (1st Attempt) Date: 11/22/23  Attempted to reach the patient regarding the most recent Inpatient/ED visit.  Follow Up Plan: Additional outreach attempts will be made to reach the patient to complete the Transitions of Care (Post Inpatient/ED visit) call.   Andrea Dimes RN, BSN [place]  [facility] RN Care Manager [phone]

## 2023-11-26 ENCOUNTER — Telehealth: Payer: Self-pay | Admitting: *Deleted

## 2023-11-26 NOTE — Transitions of Care (Post Inpatient/ED Visit) (Signed)
 11/26/2023  Name: Angie Carr MRN: [id number] DOB: [birth year]  Today's TOC FU Call Status: Today's TOC FU Call Status:: Successful TOC FU Call Completed TOC FU Call Complete Date: 11/26/23  Patient's Name and Date of Birth confirmed. Name, DOB  Transition Care Management Follow-up Telephone Call Date of Discharge: 11/21/23 Discharge Facility: [facility]) Type of Discharge: Inpatient Admission Primary Inpatient Discharge Diagnosis:: Hypomagnesemia/ Hypokalemia How have you been since you were released from the hospital?: Better (Doing good/staying hydrated/ eating a little bit more) Any questions or concerns?: No  Items Reviewed: Did you receive and understand the discharge instructions provided?: Yes Medications obtained,verified, and reconciled?: Yes (Medications Reviewed) Any new allergies since your discharge?: No Dietary orders reviewed?: No Do you have support at home?: Yes People in Home [RPT]: alone Name of Support/Comfort Primary Source: Mary sister  Medications Reviewed Today: Medications Reviewed Today     Reviewed by Kennieth Cathlean DEL, RN (Case Manager) on 11/26/23 at 1353  Med List Status: <None>   Medication Order Taking? Sig Documenting Provider Last Dose Status Informant  apixaban  (ELIQUIS ) 5 MG TABS tablet [id number] Yes Take 2 tablets (10 mg total) by mouth 2 (two) times daily for 7 days, THEN 1 tablet (5 mg total) 2 (two) times daily.  Patient taking differently: Take 1 tablet (5 mg total) 2 (two) times daily.    Willette Adriana LABOR, MD  Active Self, Pharmacy Records  cilostazol  (PLETAL ) 50 MG tablet [id number] Yes Take 50 mg by mouth 2 (two) times daily. [provider]  Active Self, Pharmacy Records           Med Note (WHITE, DONETA GORMAN Repress Sep 15, 2023 11:13 PM)    fluconazole  (DIFLUCAN ) 100 MG tablet [id number] Yes Take 2 tablets (200 mg total) by mouth daily for 1 day, THEN 1 tablet (100 mg total) daily for 14 days. Cirigliano, Vito V,  DO  Active   magnesium  oxide (MAG-OX) 400 (240 Mg) MG tablet [id number] Yes Take 2 tablets (800 mg total) by mouth daily. Gherghe, Costin M, MD  Active   mirtazapine  (REMERON ) 7.5 MG tablet [id number] Yes Take 7.5 mg by mouth daily. [provider]  Active Self, Pharmacy Records  omeprazole  (PRILOSEC) 20 MG capsule [id number] Yes Take 20 mg by mouth daily. [provider]  Active Self, Pharmacy Records  potassium chloride  SA (KLOR-CON  M) 20 MEQ tablet [id number] Yes Take 1 tablet (20 mEq total) by mouth in the morning. Gherghe, Costin M, MD  Active   pravastatin  (PRAVACHOL ) 40 MG tablet [id number] Yes Take 40 mg by mouth daily in the afternoon. [provider]  Active Self, Pharmacy Records  Med List Note Schuyler Josette SAILOR, RN 11/08/23 1018): 11/08/23 Dr Benjamine stopped all meds except for omeprazole , pletal , and eliquis  due to persistent nausea and vomiting. Seeing gastroenterologist on 11/13/23.            Home Care and Equipment/Supplies: Were Home Health Services Ordered?: Yes Name of Home Health Agency:: [facility] Has Agency set up a time to come to your home?: No EMR reviewed for Home Health Orders: Orders present/patient has not received call (refer to CM for follow-up) (Patient received call but they have not given a definitive date they are coming due to upcoming holidays) Any new equipment or medical supplies ordered?: Yes Name of Medical supply agency?: [facility] Were you able to get the equipment/medical supplies?: Yes Do you have any questions related to the use of the equipment/supplies?: No  Functional Questionnaire: Do you need assistance with bathing/showering or dressing?: No Do you need assistance with meal preparation?: No Do you need assistance with eating?: No Do you have difficulty maintaining continence: No Do you need assistance with getting out of bed/getting out of a chair/moving?: No Do you have difficulty managing or taking your medications?:  No  Follow up appointments reviewed: PCP Follow-up appointment confirmed?: Yes Date of PCP follow-up appointment?: 12/04/23 Follow-up Provider: Kennieth Leech Specialist [facility] Follow-up appointment confirmed?: NA Do you need transportation to your follow-up appointment?: No Do you understand care options if your condition(s) worsen?: Yes-patient verbalized understanding  SDOH Interventions Today    Flowsheet Row Most Recent Value  SDOH Interventions   Food Insecurity Interventions Intervention Not Indicated  Housing Interventions Intervention Not Indicated  Transportation Interventions Inpatient TOC, [facility])  Utilities Interventions Intervention Not Indicated    Goals Addressed             This Visit's Progress    VBCI Transitions of Care (TOC) Care Plan       Problems:  Recent Hospitalization for treatment of Hypokalemia/ Knowledge Deficit Related to Hypokalemia  Goal:  Over the next 30 days, the patient will not experience hospital readmission  Interventions:  Transitions of Care: Doctor Visits  - discussed the importance of doctor visits Referral to Longitudinal Nurse Case Manager for Ongoing follow-up Patient will get labs drawn next PCP visit  Patient Self Care Activities:  Attend all scheduled provider appointments Call pharmacy for medication refills 3-7 days in advance of running out of medications Call provider office for new concerns or questions  Notify RN Care Manager of [facility] call rescheduling needs Participate in Transition of Care Program/Attend [facility] scheduled calls Perform all self care activities independently  Take medications as prescribed   Increase diet to 5 small frequent feedings   Plan:  Next PCP appointment scheduled for: 12/04/2023 Telephone follow up appointment with care management team member scheduled for: 12/05/2023 Richerd Fish        Discussed and offered 30 day TOC program.  Patient  accepted.   The patient has been provided with contact information for the care management team and has been advised to call with any health -related questions or concerns.  The patient verbalized understanding with current plan of care.  The patient is directed to their insurance card regarding availability of benefits coverage    Cathlean Headland BSN RN [facility] Care Management Coordinator [email or web address] Direct Dial: [phone]  Fax: [phone] Website: [email or web address]

## 2023-12-05 ENCOUNTER — Other Ambulatory Visit: Payer: Self-pay

## 2023-12-05 ENCOUNTER — Telehealth: Payer: Self-pay

## 2023-12-05 NOTE — Transitions of Care (Post Inpatient/ED Visit) (Signed)
 Transition of Care week 2  Visit Note  12/05/2023  Name: Angie Carr MRN: [id number]          DOB: [birth year]  Situation: Patient enrolled in [facility] 30-day program. Visit completed with patient by telephone.   Background:   Initial Transition Care Management Follow-up Telephone Call Discharge Date and Diagnosis: 11/21/23, Hypomagnesemia/ Hypokalemia   Past Medical History:  Diagnosis Date   Anemia    Anxiety    Asthma    pt denies   Diabetes mellitus without complication (HCC)    DJD (degenerative joint disease)    lower back   GERD (gastroesophageal reflux disease)    Headache    miagraines, none in years   Hypertension    LBP (low back pain)    Morbid obesity (HCC)    Neuromuscular disorder (HCC)    feet tingle and her balance is sometimes a problem   Primary osteoarthritis of both knees    Smoker     Assessment: Patient Reported Symptoms: Cognitive Cognitive Status: Able to follow simple commands, Alert and oriented to person, place, and time, Normal speech and language skills      Neurological Neurological Review of Symptoms: No symptoms reported Neurological Self-Management Outcome: 3 (uncertain)  HEENT HEENT Symptoms Reported: No symptoms reported HEENT Management Strategies: Medication therapy, Routine screening HEENT Self-Management Outcome: 4 (good)    Cardiovascular Cardiovascular Symptoms Reported: No symptoms reported (12/04/23 Left foot was swollen, MD check feet) Does patient have uncontrolled Hypertension?: Yes Is patient checking Blood Pressure at home?: Yes Patient's Recent BP reading at home: 90/70 Cardiovascular Management Strategies: Medication therapy, Routine screening  Respiratory Respiratory Symptoms Reported: No symptoms reported, Shortness of breath (on occasion no problem today) Other Respiratory Symptoms: some phelgm in the morning Respiratory Management Strategies: Routine screening (Still smoking some and unable to tolerate some  of the treatment for smoking cessation) Respiratory Self-Management Outcome: 4 (good)  Endocrine Endocrine Symptoms Reported: No symptoms reported (Was taken off of Metformin  from Dr. Benjamine) Is patient diabetic?: No Is patient checking blood sugars at home?: No    Gastrointestinal Gastrointestinal Symptoms Reported: No symptoms reported Additional Gastrointestinal Details: Had colonscopy yesterday and polyps removed - hemmohoids Gastrointestinal Management Strategies: Activity, Adequate rest, Medication therapy    Genitourinary Genitourinary Symptoms Reported: No symptoms reported    Integumentary Integumentary Symptoms Reported: No symptoms reported    Musculoskeletal Musculoskelatal Symptoms Reviewed: Weakness, Unsteady gait        Psychosocial Psychosocial Symptoms Reported: No symptoms reported Behavioral Management Strategies:  (spiritual support with prayer) Major Change/Loss/Stressor/Fears (CP): Death of a loved one (Brother has passed away and memorial services is 12/08/23, she denies any issues encouraged to express grief and for support. Denies needs.)     Today's Vitals   12/05/23 1303  BP: 90/70   Pain Scale: 0-10 Pain Score: 0-No pain  Medications Reviewed Today     Reviewed by Eilleen Richerd GRADE, RN (Registered Nurse) on 12/05/23 at 1306  Med List Status: <None>   Medication Order Taking? Sig Documenting Provider Last Dose Status Informant  apixaban  (ELIQUIS ) 5 MG TABS tablet [id number]  Take 2 tablets (10 mg total) by mouth 2 (two) times daily for 7 days, THEN 1 tablet (5 mg total) 2 (two) times daily.  Patient not taking: No sig reported   Willette Adriana LABOR, MD  Active Self, Pharmacy Records  cilostazol  (PLETAL ) 50 MG tablet [id number] Yes Take 50 mg by mouth 2 (two) times daily. [provider]  Active  Self, Pharmacy Records           Med Note (WHITE, DONETA GORMAN Repress Sep 15, 2023 11:13 PM)    fluconazole  (DIFLUCAN ) 100 MG tablet [id number] Yes Take 2  tablets (200 mg total) by mouth daily for 1 day, THEN 1 tablet (100 mg total) daily for 14 days. Cirigliano, Vito V, DO  Active   magnesium  oxide (MAG-OX) 400 (240 Mg) MG tablet [id number] Yes Take 2 tablets (800 mg total) by mouth daily. Gherghe, Costin M, MD  Active   mirtazapine  (REMERON ) 7.5 MG tablet [id number] Yes Take 7.5 mg by mouth daily. [provider]  Active Self, Pharmacy Records  omeprazole  [facility]) 20 MG capsule [id number] Yes Take 20 mg by mouth daily. [provider]  Active Self, Pharmacy Records  potassium chloride  SA (KLOR-CON  M) 20 MEQ tablet [id number] Yes Take 1 tablet (20 mEq total) by mouth in the morning. Gherghe, Costin M, MD  Active   pravastatin  (PRAVACHOL ) 40 MG tablet [id number] Yes Take 40 mg by mouth daily in the afternoon. [provider]  Active Self, Pharmacy Records  Med List Note Schuyler Josette SAILOR, RN 11/08/23 1018): 11/08/23 Dr Benjamine stopped all meds except for omeprazole , pletal , and eliquis  due to persistent nausea and vomiting. Seeing gastroenterologist on 11/13/23.            Recommendation:   Continue Current Plan of Care  Follow Up Plan:   Telephone follow-up in 1 week 12/10/23 at 11:00 AM scheduled  Richerd Fish, RN, BSN, CCM [facility] Management Coordinator Direct Dial: [phone]

## 2023-12-05 NOTE — Patient Instructions (Addendum)
 Visit Information  Thank you for taking time to visit with me today. Please don't hesitate to contact me if I can be of assistance to you before our next scheduled telephone appointment.  Our next appointment is by telephone on 12/10/23 at 11 AM  Following is a copy of your care plan:   Goals Addressed             This Visit's Progress    VBCI Transitions of Care (TOC) Care Plan       Problems:  Recent Hospitalization for treatment of Hypokalemia/ Knowledge Deficit Related to Hypokalemia  Goal:  Over the next 30 days, the patient will not experience hospital readmission  Interventions:  Transitions of Care: Doctor Visits  - discussed the importance of doctor visits Referral to Longitudinal Nurse Case Manager for Ongoing follow-up Patient will get labs drawn next PCP visit  Patient Self Care Activities:  Attend all scheduled provider appointments Call pharmacy for medication refills 3-7 days in advance of running out of medications Call provider office for new concerns or questions  Notify RN Care Manager of [facility] call rescheduling needs Participate in Transition of Care Program/Attend TOC scheduled calls Perform all self care activities independently  Take medications as prescribed   Increase diet to 5 small frequent feedings -  12/05/23 still ongoing attempting 5 small meals, gotten in at least 3 small meals, was able to tolerate small portion of Thanksgiving dinner with family.   Plan:  Next PCP appointment scheduled for: 12/04/2023 (completed) Telephone follow up appointment with care management team member scheduled for: 12/05/2023 Richerd Fish  12/05/23 Discussed and offered 30 day TOC program offered and accepted last week.  Patient agrees ongoing program    .  The patient has been provided with contact information for the care management team and has been advised to call with any health -related questions or concerns.  The patient verbalized understanding with current  plan of care.  The patient is directed to their insurance card regarding availability of benefits coverage.  Patient states her benefits maybe changing, not sure she has Medicaid. She does use Medicaid transportation with [employer].        Patient verbalizes understanding of instructions and care plan provided today and agrees to view in MyChart. Active MyChart status and patient understanding of how to access instructions and care plan via MyChart confirmed with patient.   Patient states she uses it for notifications on her phone for appointments. Verbalized understanding of instructions and care plan updates.  Telephone follow up appointment with care management team member scheduled for: The patient has been provided with contact information for the care management team and has been advised to call with any health related questions or concerns.  The care management team will reach out to the patient again over the next 4-10 business days.   Please call the care guide team at [phone] if you need to cancel or reschedule your appointment.   Please call the Suicide and Crisis Lifeline: 988 call the [place]  National Suicide Prevention Lifeline: 914-275-7228 or TTY: 318-468-4056 TTY 602-081-9855) to talk to a trained counselor call 1-800-273-TALK (toll free, 24 hour hotline) go to [facility] [address], [place] [phone]) call 911 if you are experiencing a Mental Health or Behavioral Health Crisis or need someone to talk to.  Richerd Fish, RN, BSN, CCM [facility], [facility] Management Coordinator Direct Dial: [phone]

## 2023-12-10 ENCOUNTER — Telehealth: Payer: Self-pay

## 2023-12-10 ENCOUNTER — Other Ambulatory Visit: Payer: Self-pay

## 2023-12-10 NOTE — Transitions of Care (Post Inpatient/ED Visit) (Signed)
 Transition of Care week 3  Visit Note  12/10/2023  Name: Angie Carr MRN: [id number]          DOB: 01-Jan-1956  Situation: Patient enrolled in [facility] 30-day program. Visit completed with patient by telephone.   Background:   Initial Transition Care Management Follow-up Telephone Call Discharge Date and Diagnosis: 11/21/23, Hypomagnesemia/ Hypokalemia   Past Medical History:  Diagnosis Date   Anemia    Anxiety    Asthma    pt denies   Diabetes mellitus without complication (HCC)    DJD (degenerative joint disease)    lower back   GERD (gastroesophageal reflux disease)    Headache    miagraines, none in years   Hypertension    LBP (low back pain)    Morbid obesity (HCC)    Neuromuscular disorder (HCC)    feet tingle and her balance is sometimes a problem   Primary osteoarthritis of both knees    Smoker     Assessment: Patient Reported Symptoms: Cognitive Cognitive Status: Able to follow simple commands, Alert and oriented to person, place, and time, Normal speech and language skills      Neurological Neurological Review of Symptoms: No symptoms reported    HEENT HEENT Symptoms Reported: No symptoms reported      Cardiovascular Cardiovascular Symptoms Reported: No symptoms reported Does patient have uncontrolled Hypertension?: Yes Is patient checking Blood Pressure at home?: Yes Patient's Recent BP reading at home: 121/84 Cardiovascular Management Strategies: Medication therapy, Routine screening  Respiratory Respiratory Symptoms Reported: Shortness of breath Other Respiratory Symptoms: no more phlegm Respiratory Management Strategies: Medication therapy, Routine screening  Endocrine Endocrine Symptoms Reported: No symptoms reported Is patient diabetic?: No Endocrine Self-Management Outcome: 4 (good)  Gastrointestinal Gastrointestinal Symptoms Reported: No symptoms reported Additional Gastrointestinal Details: Had colonscopy yesterday and polyps removed  -hemorrhoids Gastrointestinal Management Strategies: Diet modification, Fluid modification, Medication therapy (appetite has improved) Gastrointestinal Self-Management Outcome: 4 (good)    Genitourinary Genitourinary Symptoms Reported: No symptoms reported    Integumentary Integumentary Symptoms Reported: No symptoms reported    Musculoskeletal Musculoskelatal Symptoms Reviewed: Weakness, Unsteady gait Additional Musculoskeletal Details: Continues to have HHPT following Musculoskeletal Management Strategies: Activity, Medical device, Routine screening Musculoskeletal Self-Management Outcome: 4 (good)      Psychosocial Psychosocial Symptoms Reported: No symptoms reported         Today's Vitals   12/10/23 1101  BP: 121/84   Pain Scale: 0-10 Pain Score: 0-No pain  Medications Reviewed Today     Reviewed by Eilleen Richerd GRADE, RN (Registered Nurse) on 12/10/23 at 1105  Med List Status: <None>   Medication Order Taking? Sig Documenting Provider Last Dose Status Informant  apixaban  (ELIQUIS ) 5 MG TABS tablet [id number] Yes Take 2 tablets (10 mg total) by mouth 2 (two) times daily for 7 days, THEN 1 tablet (5 mg total) 2 (two) times daily. Willette Adriana LABOR, MD  Active Self, Pharmacy Records  cilostazol  (PLETAL ) 50 MG tablet [id number] Yes Take 50 mg by mouth 2 (two) times daily. [provider]  Active Self, Pharmacy Records           Med Note [facility], DONETA GORMAN Repress Sep 15, 2023 11:13 PM)    magnesium  oxide (MAG-OX) 400 (240 Mg) MG tablet [id number] Yes Take 2 tablets (800 mg total) by mouth daily. Gherghe, Costin M, MD  Active   mirtazapine  (REMERON ) 7.5 MG tablet [id number] Yes Take 7.5 mg by mouth daily. [provider]  Active Self, Pharmacy Records  omeprazole  (PRILOSEC) 20 MG capsule [id number] Yes Take 20 mg by mouth daily. [provider]  Active Self, Pharmacy Records  potassium chloride  SA (KLOR-CON  M) 20 MEQ tablet [id number] Yes Take 1 tablet (20 mEq  total) by mouth in the morning. Gherghe, Costin M, MD  Active   pravastatin  (PRAVACHOL ) 40 MG tablet [id number] Yes Take 40 mg by mouth daily in the afternoon. [provider]  Active Self, Pharmacy Records  Med List Note Schuyler Josette SAILOR, RN 11/08/23 1018): 11/08/23 Dr Benjamine stopped all meds except for omeprazole , pletal , and eliquis  due to persistent nausea and vomiting. Seeing gastroenterologist on 11/13/23.            Recommendation:   PCP Follow-up Continue Current Plan of Care  Follow Up Plan:   Telephone follow-up in 1 week  Richerd Fish, RN, BSN, CCM [facility], [facility] Management Coordinator Direct Dial: [phone]

## 2023-12-10 NOTE — Patient Instructions (Addendum)
 Visit Information  Thank you for taking time to visit with me today. Please don't hesitate to contact me if I can be of assistance to you before our next scheduled telephone appointment.  Our next appointment is by telephone on 12/20/23 at 11:00 AM  Following is a copy of your care plan:   Goals Addressed             This Visit's Progress    VBCI Transitions of Care (TOC) Care Plan   Improving    Problems:  Recent Hospitalization for treatment of Hypokalemia/ Knowledge Deficit Related to Hypokalemia 12/10/23 Having some shortness of breath with activity and sats around 97% since she has had COVID. Cancelled appointment with [facility] states had her colonoscopy there last week 12/04/23 had polyps and hemorrhoids. Patient states feet swelling since colonoscopy and PCP saw feet and checked feet on 12/04/23. Patient states HHPT encouraged her to put feet up during the day. Also, this writer encouraged her to take rest breaks with feet up on pillows if possible and monitor exercise and walking with walker. To stand first and get her bearings. Denies dizziness. BP check at home and with [facility] she states. 02 sats remains above 95% with HH visits monitoring and home monitoring.  Goal:  Over the next 30 days, the patient will not experience hospital readmission  Interventions:  Transitions of Care: Doctor Visits  - discussed the importance of doctor visits Referral to Longitudinal Nurse Case Manager for Ongoing follow-up after 30 day program Patient will get labs drawn next PCP visit  Patient Self Care Activities:  Attend all scheduled provider appointments Call pharmacy for medication refills 3-7 days in advance of running out of medications Call provider office for new concerns or questions  Notify RN Care Manager of [facility] call rescheduling needs Participate in Transition of Care Program/Attend TOC scheduled calls Perform all self care activities independently  Take medications as  prescribed   Increase diet to 5 small frequent feedings -  12/05/23 still ongoing attempting 5 small meals, gotten in at least 3 small meals, was able to tolerate small portion of Thanksgiving dinner with family. 12/10/23 Patient to follow up with PCP about lab results from post hospital follow up 12/04/23. Patient states she knows who to contact for follow up if PCP can't make appointment.  Plan:  Next PCP appointment scheduled for: 12/04/2023 (completed) Telephone follow up appointment with care management team member scheduled for: 12/05/2023 Richerd Fish  12/05/23 Discussed and offered 30 day TOC program offered and accepted last week.  Patient agrees ongoing program    .  The patient has been provided with contact information for the care management team and has been advised to call with any health -related questions or concerns.  The patient verbalized understanding with current plan of care.  The patient is directed to their insurance card regarding availability of benefits coverage.  Patient states her benefits maybe changing, not sure she has Medicaid. She does use transportation with [employer] and paying out of pocket $2.50 ($5.00 roundtrip)        Patient verbalizes understanding of instructions and care plan provided today and agrees to view in MyChart. Active MyChart status and patient understanding of how to access instructions and care plan via MyChart confirmed with patient.     The patient has been provided with contact information for the care management team and has been advised to call with any health related questions or concerns.  Follow up with provider  re: worsening shortness of breath and feet swelling  Please call the care guide team at [phone] if you need to cancel or reschedule your appointment.   Please call the [place]  National Suicide Prevention Lifeline: 6085075990 or TTY: 210-056-8375 TTY 305-540-9971) to talk to a trained counselor if you are experiencing a  Mental Health or Behavioral Health Crisis or need someone to talk to.  Richerd Fish, RN, BSN, CCM [facility], [facility] Management Coordinator Direct Dial: [phone]

## 2023-12-18 NOTE — Transitions of Care (Post Inpatient/ED Visit) (Signed)
° °  12/18/2023  Name: Angie Carr MRN: [id number] DOB: 10-May-1955  Erroneous entry

## 2023-12-18 NOTE — Progress Notes (Signed)
 This encounter was created in error - please disregard.

## 2023-12-19 ENCOUNTER — Other Ambulatory Visit: Payer: Self-pay

## 2023-12-19 ENCOUNTER — Telehealth

## 2023-12-19 NOTE — Transitions of Care (Post Inpatient/ED Visit) (Signed)
 Transition of Care week 4  Visit Note  12/19/2023  Name: Angie Carr MRN: [id number]          DOB: [birth year]  Situation: Patient enrolled in [facility] 30-day program. Visit completed with patient by telephone.   Background:   Initial Transition Care Management Follow-up Telephone Call Discharge Date and Diagnosis: 11/21/23, Hypomagnesemia/ Hypokalemia   Past Medical History:  Diagnosis Date   Anemia    Anxiety    Asthma    pt denies   Diabetes mellitus without complication (HCC)    DJD (degenerative joint disease)    lower back   GERD (gastroesophageal reflux disease)    Headache    miagraines, none in years   Hypertension    LBP (low back pain)    Morbid obesity (HCC)    Neuromuscular disorder (HCC)    feet tingle and her balance is sometimes a problem   Primary osteoarthritis of both knees    Smoker     Assessment: Patient Reported Symptoms: Cognitive Cognitive Status: Able to follow simple commands, Alert and oriented to person, place, and time, Normal speech and language skills      Neurological Neurological Review of Symptoms: No symptoms reported Neurological Management Strategies: Routine screening  HEENT HEENT Symptoms Reported: No symptoms reported      Cardiovascular Cardiovascular Symptoms Reported: No symptoms reported Does patient have uncontrolled Hypertension?:  (blood pressures are running low even off of the blood pressure medications, rehab called Dr. Lanae about it yesterday but no signs of symptoms I can tell) Patient's Recent BP reading at home: 75/50 Cardiovascular Management Strategies: Medication therapy, Routine screening Cardiovascular Self-Management Outcome: 3 (uncertain)  Respiratory Respiratory Symptoms Reported: Shortness of breath (only on exertion) Other Respiratory Symptoms: occasional phlegm Respiratory Management Strategies: Medication therapy, Routine screening  Endocrine Endocrine Symptoms Reported: No symptoms  reported    Gastrointestinal Gastrointestinal Symptoms Reported: No symptoms reported Additional Gastrointestinal Details: loose Gastrointestinal Management Strategies: Diet modification, Fluid modification, Medication therapy Gastrointestinal Self-Management Outcome: 4 (good)    Genitourinary Genitourinary Symptoms Reported: No symptoms reported Genitourinary Management Strategies: Incontinence garment/pad Genitourinary Self-Management Outcome: 4 (good)  Integumentary Integumentary Symptoms Reported: No symptoms reported Skin Self-Management Outcome: 4 (good) Skin Comment: Dr. Benjamine checks feet at appointments  Musculoskeletal Musculoskelatal Symptoms Reviewed: Weakness, Unsteady gait Additional Musculoskeletal Details: Still working with PT Musculoskeletal Management Strategies: Activity, Medical device, Routine screening Musculoskeletal Self-Management Outcome: 4 (good)      Psychosocial Psychosocial Symptoms Reported: No symptoms reported         Today's Vitals   12/19/23 1105  BP: (!) 75/50   Pain Scale: 0-10 Pain Score: 0-No pain  Medications Reviewed Today     Reviewed by Eilleen Richerd GRADE, RN (Registered Nurse) on 12/19/23 at 1104  Med List Status: <None>   Medication Order Taking? Sig Documenting Provider Last Dose Status Informant  apixaban  (ELIQUIS ) 5 MG TABS tablet [id number] Yes Take 2 tablets (10 mg total) by mouth 2 (two) times daily for 7 days, THEN 1 tablet (5 mg total) 2 (two) times daily. Willette Adriana LABOR, MD  Active Self, Pharmacy Records  cilostazol  (PLETAL ) 50 MG tablet [id number] Yes Take 50 mg by mouth 2 (two) times daily. [provider]  Active Self, Pharmacy Records           Med Note [facility], DONETA GORMAN Repress Sep 15, 2023 11:13 PM)    magnesium  oxide (MAG-OX) 400 (240 Mg) MG tablet [id number] Yes Take 2 tablets (800 mg total)  by mouth daily. Gherghe, Costin M, MD  Active   mirtazapine  (REMERON ) 7.5 MG tablet [id number] Yes Take 7.5 mg by mouth  daily. [provider]  Active Self, Pharmacy Records  omeprazole  (PRILOSEC) 20 MG capsule [id number] Yes Take 20 mg by mouth daily. [provider]  Active Self, Pharmacy Records  potassium chloride  SA (KLOR-CON  M) 20 MEQ tablet [id number] Yes Take 1 tablet (20 mEq total) by mouth in the morning. Gherghe, Costin M, MD  Active   pravastatin  (PRAVACHOL ) 40 MG tablet [id number] Yes Take 40 mg by mouth daily in the afternoon. [provider]  Active Self, Pharmacy Records  Med List Note Schuyler Josette SAILOR, RN 11/08/23 1018): 11/08/23 Dr Benjamine stopped all meds except for omeprazole , pletal , and eliquis  due to persistent nausea and vomiting. Seeing gastroenterologist on 11/13/23.           Care gaps were reviewed with patient today and patient verbalized understanding to follow up with primary care provider during office visits and discuss any concerns. Patient has had foot exam done 12/04/23 with PCP.   Recommendation:   PCP Follow-up Continue Current Plan of Care and patient to contact PCP for a follow up with BP  Follow Up Plan:   Telephone follow-up in 1 week December 24, 2023 at 3 PM  Richerd Fish, CHARITY FUNDRAISER, SCIENTIST, RESEARCH (PHYSICAL SCIENCES), CCM [employer], [facility] Management Coordinator Direct Dial: [phone]

## 2023-12-19 NOTE — Patient Instructions (Signed)
 Visit Information  Thank you for taking time to visit with me today. Please don't hesitate to contact me if I can be of assistance to you before our next scheduled telephone appointment.  Our next appointment is by telephone on 12/24/23 at 1500  Following is a copy of your care plan:   Goals Addressed             This Visit's Progress    VBCI Transitions of Care (TOC) Care Plan   On track    Problems:  Recent Hospitalization for treatment of Hypokalemia/ Knowledge Deficit Related to Hypokalemia 12/10/23 Having some shortness of breath with activity and sats around 97% since she has had COVID. Cancelled appointment with [facility] states had her colonoscopy there last week 12/04/23 had polyps and hemorrhoids. Patient states feet swelling since colonoscopy and PCP saw feet and checked feet on 12/04/23. Patient states HHPT encouraged her to put feet up during the day. Also, this writer encouraged her to take rest breaks with feet up on pillows if possible and monitor exercise and walking with walker. To stand first and get her bearings. Denies dizziness. BP check at home and with [facility] she states. 02 sats remains above 95% with HH visits monitoring and home monitoring. 12/19/23 Follow up Dr. Benjamine regarding blood pressure and labs, patient to call for appointment.  Goal:  Over the next 30 days, the patient will not experience hospital readmission  Interventions:  Transitions of Care: Doctor Visits  - discussed the importance of doctor visits Referral to Longitudinal Nurse Case Manager for Ongoing follow-up after 30 day program Patient will get labs drawn next PCP visit  Patient Self Care Activities:  Attend all scheduled provider appointments Call pharmacy for medication refills 3-7 days in advance of running out of medications Call provider office for new concerns or questions  Notify RN Care Manager of [facility] call rescheduling needs Participate in Transition of Care Program/Attend TOC  scheduled calls Perform all self care activities independently  Take medications as prescribed   Increase diet to 5 small frequent feedings -  12/05/23 still ongoing attempting 5 small meals, gotten in at least 3 small meals, was able to tolerate small portion of Thanksgiving dinner with family. 12/10/23 Patient to follow up with PCP about lab results from post hospital follow up 12/04/23. Patient states she knows who to contact for follow up if PCP can't make appointment.  Plan:  Next PCP appointment scheduled for: 12/04/2023 (completed) Telephone follow up appointment with care management team member scheduled for: 12/05/2023 Richerd Fish  12/05/23 Discussed and offered 30 day TOC program offered and accepted last week.  Patient agrees ongoing program    .  The patient has been provided with contact information for the care management team and has been advised to call with any health -related questions or concerns.  The patient verbalized understanding with current plan of care.  The patient is directed to their insurance card regarding availability of benefits coverage.  Patient states her benefits maybe changing, not sure she has Medicaid. She does use transportation with [employer] and paying out of pocket $2.50 ($5.00 roundtrip) 12/19/23 Continue plan of care and eating, OP rehab weekly, monitoring blood pressure, and symptoms, safety getting up slowing, keep feet elevated. Patient know who to follow up with medical needs.         The patient verbalized understanding of instructions, educational materials, and care plan provided today and DECLINED offer to receive copy of patient instructions, educational  materials, and care plan.   The patient has been provided with contact information for the care management team and has been advised to call with any health related questions or concerns.   Please call the care guide team at [phone] if you need to cancel or reschedule your appointment.    Please call the [place]  National Suicide Prevention Lifeline: 636-225-4481 or TTY: (628) 814-9033 TTY 915 777 5409) to talk to a trained counselor go to [facility] [address] [place] [phone]) call 911 if you are experiencing a Mental Health or Behavioral Health Crisis or need someone to talk to.  Richerd Fish, RN, BSN, CCM [facility], [facility] Management Coordinator Direct Dial: [phone]

## 2023-12-20 NOTE — Patient Outreach (Signed)
 Complex Care Management   Visit Note  11/08/2023  Name:  Angie Carr MRN: [id number] DOB: Feb 20, 1955  Situation: Referral received for Complex Care Management related to persistent nausea and vomiting in a patient with diabetes. I obtained verbal consent from Patient.  Visit completed with Patient  on the phone  Background:   Past Medical History:  Diagnosis Date   Anemia    Anxiety    Asthma    pt denies   Diabetes mellitus without complication (HCC)    DJD (degenerative joint disease)    lower back   GERD (gastroesophageal reflux disease)    Headache    miagraines, none in years   Hypertension    LBP (low back pain)    Morbid obesity (HCC)    Neuromuscular disorder (HCC)    feet tingle and her balance is sometimes a problem   Primary osteoarthritis of both knees    Smoker     Assessment: Patient Reported Symptoms:  Cognitive Cognitive Status: Alert and oriented to person, place, and time, Normal speech and language skills, No symptoms reported Cognitive/Intellectual Conditions Management [RPT]: None reported or documented in medical history or problem list   Health Maintenance Behaviors: Annual physical exam Healing Pattern: Unsure Health Facilitated by: Rest  Neurological Neurological Review of Symptoms: No symptoms reported    HEENT HEENT Symptoms Reported: No symptoms reported      Cardiovascular Cardiovascular Symptoms Reported: No symptoms reported    Respiratory Respiratory Symptoms Reported: No symptoms reported    Endocrine Endocrine Symptoms Reported: No symptoms reported Is patient diabetic?: Yes Is patient checking blood sugars at home?: No Endocrine Self-Management Outcome: 3 (uncertain) Endocrine Comment: Blood sugar is checked at provider visits. Medications were paused by PCP due to persistent N/V. Discussed effect of illness and dehydration on blood sugar control  Gastrointestinal Gastrointestinal Symptoms Reported: Nausea, Vomiting,  Change in appetite Gastrointestinal Management Strategies: Adequate rest, Diet modification, Fluid modification Gastrointestinal Self-Management Outcome: 3 (uncertain) Gastrointestinal Comment: Saw PCP, Dr Benjamine, yesterday. Continues to have decreased taste, nausea, vomiting, and dry heaves. Unable to keep down even a sip of water. Continues to urinate. Discussed risk for dehydration and when to return to the ED. I called Dr Vickii office and left message with nurse asking if Zofran  ODT would be appropriate. Discussed with patient as well and provided MOA for Zofran  and purpose of medication. Awaiting a call back. Patient is scheduled with gastroenterologist at [facility] on 11/13/23. Discussed that N/V could be a symptom of long COVID but that gastroenterologist will assess for cause.    Genitourinary Genitourinary Symptoms Reported: Other Other Genitourinary Symptoms: Patient states that she is still urinating although it is not as much as usual. Genitourinary Comment: discussed risk for kidney injury from dehydration associated with persistent N/V. ED return precautions given.  Integumentary Integumentary Symptoms Reported: No symptoms reported    Musculoskeletal Musculoskelatal Symptoms Reviewed: No symptoms reported   Falls in the past year?: No Number of falls in past year: 1 or less Was there an injury with Fall?: No Fall Risk Category Calculator: 0 Patient Fall Risk Level: Low Fall Risk    Psychosocial Psychosocial Symptoms Reported: No symptoms reported          12/20/2023    PHQ2-9 Depression Screening   Little interest or pleasure in doing things    Feeling down, depressed, or hopeless    PHQ-2 - Total Score    Trouble falling or staying asleep, or sleeping too much  Feeling tired or having little energy    Poor appetite or overeating     Feeling bad about yourself - or that you are a failure or have let yourself or your family down    Trouble  concentrating on things, such as reading the newspaper or watching television    Moving or speaking so slowly that other people could have noticed.  Or the opposite - being so fidgety or restless that you have been moving around a lot more than usual    Thoughts that you would be better off dead, or hurting yourself in some way    PHQ2-9 Total Score    If you checked off any problems, how difficult have these problems made it for you to do your work, take care of things at home, or get along with other people    Depression Interventions/Treatment      There were no vitals filed for this visit.    Medications Reviewed Today     Reviewed by Charlsie Josette SAILOR, RN (Registered Nurse) on 11/08/23 at 1018  Med List Status: <None>   Medication Order Taking? Sig Documenting Provider Last Dose Status Informant  apixaban  (ELIQUIS ) 5 MG TABS tablet [id number] Yes Take 2 tablets (10 mg total) by mouth 2 (two) times daily for 7 days, THEN 1 tablet (5 mg total) 2 (two) times daily. Willette Adriana LABOR, MD  Active Father  cilostazol  (PLETAL ) 50 MG tablet [id number] Yes Take 50 mg by mouth 2 (two) times daily. [provider]  Active Self, Pharmacy Records           Med Note (WHITE, DONETA GORMAN Repress Sep 15, 2023 11:13 PM)    diphenoxylate-atropine (LOMOTIL) 2.5-0.025 MG tablet [id number] Yes Take 1 tablet by mouth 4 (four) times daily as needed for diarrhea or loose stools. [provider]  Active Self, Pharmacy Records           Med Note [facility], DONETA GORMAN Repress Sep 15, 2023 11:13 PM)    fesoterodine  (TOVIAZ ) 4 MG TB24 tablet [id number]  Take 4 mg by mouth daily.  Patient not taking: Reported on 11/08/2023   [provider]  Active Self, Pharmacy Records           Med Note [facility], DONETA GORMAN Repress Sep 15, 2023 11:13 PM)    gabapentin  (NEURONTIN ) 400 MG capsule [id number]  Take 400 mg by mouth See admin instructions. Take 1 capsule (400 mg) by mouth in the morning & take 2 capsules (800 mg) by  mouth in the evening.  Patient not taking: Reported on 11/08/2023   [provider]  Active Self, Pharmacy Records           Med Note [facility], DONETA GORMAN Repress Sep 15, 2023 11:14 PM)    metFORMIN  (GLUCOPHAGE ) 1000 MG tablet [id number]  Take 1 tablet (1,000 mg total) by mouth 2 (two) times daily with a meal.  Patient not taking: Reported on 11/08/2023   Dennise Lavada POUR, MD  Active Self, Pharmacy Records           Med Note [facility], DONETA GORMAN Repress Sep 15, 2023 11:14 PM)    mirtazapine  (REMERON ) 7.5 MG tablet [id number]  Take 7.5 mg by mouth daily.  Patient not taking: Reported on 11/08/2023   [provider]  Active   naproxen (EC NAPROSYN) 500 MG EC tablet [id number]  Take 500 mg by mouth 2 (two) times daily  with a meal.  Patient not taking: Reported on 11/08/2023   [provider]  Active            Med Note LESLY, RICHERD CINDERELLA Debar Oct 08, 2023 11:32 AM) Patient states she was taking this and was prescribed by Dr. Benjamine on 04/29/23 and needs a refill due to tingling in both feet for neuropathy  nebivolol  (BYSTOLIC ) 5 MG tablet [id number]  Take 5 mg by mouth daily.  Patient not taking: Reported on 11/08/2023   [provider]  Active   omeprazole  (PRILOSEC) 20 MG capsule [id number] Yes Take 20 mg by mouth daily. [provider]  Active   pantoprazole  (PROTONIX ) 40 MG tablet [id number]  Take 1 tablet (40 mg total) by mouth daily.  Patient not taking: Reported on 11/08/2023   Willette Adriana LABOR, MD  Active   Potassium Chloride  ER 20 MEQ TBCR [id number]  Take 20 mEq by mouth in the morning.  Patient not taking: Reported on 11/08/2023   [provider]  Active Self, Pharmacy Records           Med Note [facility], DONETA GORMAN Repress Sep 15, 2023 11:15 PM)    pravastatin  (PRAVACHOL ) 40 MG tablet [id number]  Take 40 mg by mouth daily in the afternoon.  Patient not taking: Reported on 11/08/2023   [provider]  Active Self, Pharmacy Records  Med List Note  Schuyler Josette SAILOR, RN 11/08/23 1018): 11/08/23 Dr Benjamine stopped all meds except for omeprazole , pletal , and eliquis  due to persistent nausea and vomiting. Seeing gastroenterologist on 11/13/23.            Recommendation:   PCP Follow-up Specialty provider follow-up gastroenterology  Follow Up Plan:   Telephone follow-up 2 weeks  Josette Pellet, RN, BSN [place]  [facility], [facility] RN Care Manager Direct Dial: [phone]  Fax: [phone]

## 2023-12-20 NOTE — Patient Instructions (Signed)
 Visit Information  Thank you for taking time to visit with me today. Please don't hesitate to contact me if I can be of assistance to you before our next scheduled appointment.  Following is a copy of your care plan:   Goals Addressed             This Visit's Progress    [facility] RN Care Plan       Problems:  Chronic Disease Management support and education needs related to persistent nausea and vomiting in a patient with diabetes  Goal: Over the next 6 months the Patient will work with PCP and specialists to resolve nausea and vomiting as evidenced by chart review and patient report Over the next 6 months, the patient will not have any hospital admissions due to nausea and vomiting Over the next 3 months, the patient will see improvement in nausea and vomiting symptoms and increased appetite  Interventions:  Chart reviewed including hospitalization notes, referrals, lab reports, and imaging studies Medications reviewed and discussed Discussed appetite and food intake Discussed impact of nausea and vomiting on daily life Discussed altered sense of taste and smell since having covid in September Encouraged to keep all medical appointments and to seek emergency medical attention if needed Contacted Dr Vickii office and left a message requesting Zofran  ODT if they feel it is safe and appropriate Provided patient with information on Zofran  ODT and that it dissolves under the tongue, enters the blood stream through those blood vessels, and begins to work very quickly to relieve nausea. Discussed potential side effects including headache. Encouraged to stay avoid dehydration Discussed how illness, poor oral intake, and vomiting affect blood sugar. Encouraged to monitor closely. Encouraged to reach out to [facility] as needed  Patient Self-Care Activities:  Attend all scheduled provider appointments Call provider office for new concerns or questions  Take medications as prescribed     Plan:  The care management team will reach out to the patient again over the next 15 days.             Please call 911 if you are experiencing a Mental Health or Behavioral Health Crisis or need someone to talk to.  Patient verbalized understanding of Care plan and visit instructions communicated this visit  Josette Pellet, RN, BSN [place]  [facility] RN Care Manager Direct Dial: [phone]  Fax: [phone]

## 2023-12-24 ENCOUNTER — Other Ambulatory Visit: Payer: Self-pay

## 2023-12-24 ENCOUNTER — Telehealth: Payer: Self-pay

## 2023-12-24 NOTE — Transitions of Care (Post Inpatient/ED Visit) (Signed)
 " Transition of Care Week 5 final  Visit Note  12/24/2023  Name: Angie Carr MRN: [id number]          DOB: 08/19/1955  Situation: Patient enrolled in [facility] 30-day program. Visit completed with patient by telephone.   Background:   Initial Transition Care Management Follow-up Telephone Call Discharge Date and Diagnosis: 11/21/23, Hypomagnesemia/ Hypokalemia   Past Medical History:  Diagnosis Date   Anemia    Anxiety    Asthma    pt denies   Diabetes mellitus without complication (HCC)    DJD (degenerative joint disease)    lower back   GERD (gastroesophageal reflux disease)    Headache    miagraines, none in years   Hypertension    LBP (low back pain)    Morbid obesity (HCC)    Neuromuscular disorder (HCC)    feet tingle and her balance is sometimes a problem   Primary osteoarthritis of both knees    Smoker     Assessment: Patient Reported Symptoms: Cognitive Cognitive Status: Able to follow simple commands, Alert and oriented to person, place, and time, Normal speech and language skills      Neurological Neurological Review of Symptoms: No symptoms reported Neurological Management Strategies: Routine screening  HEENT HEENT Symptoms Reported: No symptoms reported (ears feel stopped up) HEENT Management Strategies: Medication therapy HEENT Self-Management Outcome: 4 (good)    Cardiovascular Cardiovascular Symptoms Reported: Fatigue Does patient have uncontrolled Hypertension?:  (blood pressures are running low even off of the blood pressure medications, rehab called Dr. Benjamine about it yesterday but no signs of symptoms I can tell) Patient's Recent BP reading at home: 86/60 (Patient states PCP is aware and she is monitoring for any symptoms, states she is currently not having issues, just a 'head cold' but no fevers) Cardiovascular Management Strategies: Routine screening  Respiratory Respiratory Symptoms Reported: Shortness of breath Other Respiratory Symptoms:  denies any phlegm this week taking OTC meds for 'head cold' with no fevers Respiratory Management Strategies: Medication therapy, Routine screening Respiratory Self-Management Outcome: 4 (good)  Endocrine Endocrine Symptoms Reported: No symptoms reported Is patient diabetic?: No Endocrine Self-Management Outcome: 4 (good)  Gastrointestinal Gastrointestinal Symptoms Reported: No symptoms reported Additional Gastrointestinal Details: occasional loose Gastrointestinal Management Strategies: Diet modification, Incontinence garment/pad, Medication therapy (pads) Gastrointestinal Self-Management Outcome: 4 (good)    Genitourinary Genitourinary Symptoms Reported: No symptoms reported Genitourinary Management Strategies: Incontinence garment/pad Genitourinary Self-Management Outcome: 4 (good)  Integumentary Integumentary Symptoms Reported: No symptoms reported    Musculoskeletal Musculoskelatal Symptoms Reviewed: Weakness, Difficulty walking Additional Musculoskeletal Details: HH PT weekly now Musculoskeletal Management Strategies: Activity, Medical device, Medication therapy Musculoskeletal Self-Management Outcome: 4 (good) Falls in the past year?: No    Psychosocial Psychosocial Symptoms Reported: No symptoms reported Behavioral Health Self-Management Outcome: 4 (good)       Today's Vitals   12/24/23 1506  BP: (!) 87/60   Pain Scale: 0-10 Pain Score: 0-No pain  Medications Reviewed Today     Reviewed by Eilleen Richerd GRADE, RN (Registered Nurse) on 12/24/23 at 1504  Med List Status: <None>   Medication Order Taking? Sig Documenting Provider Last Dose Status Informant  apixaban  (ELIQUIS ) 5 MG TABS tablet [id number] Yes Take 2 tablets (10 mg total) by mouth 2 (two) times daily for 7 days, THEN 1 tablet (5 mg total) 2 (two) times daily. Willette Adriana LABOR, MD  Active Self, Pharmacy Records  cilostazol  (PLETAL ) 50 MG tablet [id number] Yes Take 50 mg by mouth 2 (two) times  daily. [provider]  Active Self, Pharmacy Records           Med Note [facility], DONETA GORMAN Repress Sep 15, 2023 11:13 PM)    magnesium  oxide (MAG-OX) 400 (240 Mg) MG tablet [id number] Yes Take 2 tablets (800 mg total) by mouth daily. Gherghe, Costin M, MD  Active   mirtazapine  (REMERON ) 7.5 MG tablet [id number] Yes Take 7.5 mg by mouth daily. [provider]  Active Self, Pharmacy Records  omeprazole  (PRILOSEC) 20 MG capsule [id number] Yes Take 20 mg by mouth daily. [provider]  Active Self, Pharmacy Records  potassium chloride  SA (KLOR-CON  M) 20 MEQ tablet [id number] Yes Take 1 tablet (20 mEq total) by mouth in the morning. Gherghe, Costin M, MD  Active   pravastatin  (PRAVACHOL ) 40 MG tablet [id number] Yes Take 40 mg by mouth daily in the afternoon. [provider]  Active Self, Pharmacy Records  Med List Note Schuyler Josette SAILOR, RN 11/08/23 1018): 11/08/23 Dr Benjamine stopped all meds except for omeprazole , pletal , and eliquis  due to persistent nausea and vomiting. Seeing gastroenterologist on 11/13/23.            Recommendation:   PCP Follow-up Referral to: back to CCM RN for ongoing Complex Care Management needs.  Follow Up Plan:   Closing From:  Transitions of Care Program Patient has met all care management goals. Care Management case will be closed. Patient has been provided contact information should new needs arise.   Richerd Fish, RN, BSN, CCM [facility], [facility] Management Coordinator Direct Dial: [phone]           "

## 2024-01-07 ENCOUNTER — Other Ambulatory Visit: Payer: Self-pay | Admitting: *Deleted

## 2024-01-07 ENCOUNTER — Encounter: Payer: Self-pay | Admitting: *Deleted

## 2024-01-07 ENCOUNTER — Other Ambulatory Visit: Payer: Self-pay

## 2024-01-14 ENCOUNTER — Inpatient Hospital Stay (HOSPITAL_COMMUNITY)
Admission: EM | Admit: 2024-01-14 | Discharge: 2024-01-18 | DRG: 291 | Disposition: A | Discharge status: Home Health | Attending: Internal Medicine | Admitting: Internal Medicine

## 2024-01-14 ENCOUNTER — Emergency Department (HOSPITAL_COMMUNITY)

## 2024-01-14 ENCOUNTER — Other Ambulatory Visit: Payer: Self-pay

## 2024-01-14 ENCOUNTER — Encounter (HOSPITAL_COMMUNITY): Payer: Self-pay

## 2024-01-14 DIAGNOSIS — I7121 Aneurysm of the ascending aorta, without rupture: Secondary | ICD-10-CM | POA: Diagnosis present

## 2024-01-14 DIAGNOSIS — K219 Gastro-esophageal reflux disease without esophagitis: Secondary | ICD-10-CM | POA: Diagnosis not present

## 2024-01-14 DIAGNOSIS — R627 Adult failure to thrive: Secondary | ICD-10-CM | POA: Diagnosis present

## 2024-01-14 DIAGNOSIS — Z96642 Presence of left artificial hip joint: Secondary | ICD-10-CM | POA: Diagnosis present

## 2024-01-14 DIAGNOSIS — D509 Iron deficiency anemia, unspecified: Secondary | ICD-10-CM | POA: Diagnosis present

## 2024-01-14 DIAGNOSIS — R1311 Dysphagia, oral phase: Secondary | ICD-10-CM | POA: Diagnosis present

## 2024-01-14 DIAGNOSIS — I5033 Acute on chronic diastolic (congestive) heart failure: Secondary | ICD-10-CM | POA: Diagnosis present

## 2024-01-14 DIAGNOSIS — I739 Peripheral vascular disease, unspecified: Secondary | ICD-10-CM | POA: Diagnosis not present

## 2024-01-14 DIAGNOSIS — R54 Age-related physical debility: Secondary | ICD-10-CM | POA: Diagnosis present

## 2024-01-14 DIAGNOSIS — E782 Mixed hyperlipidemia: Secondary | ICD-10-CM | POA: Diagnosis present

## 2024-01-14 DIAGNOSIS — Z86711 Personal history of pulmonary embolism: Secondary | ICD-10-CM | POA: Diagnosis not present

## 2024-01-14 DIAGNOSIS — K449 Diaphragmatic hernia without obstruction or gangrene: Secondary | ICD-10-CM | POA: Diagnosis present

## 2024-01-14 DIAGNOSIS — E876 Hypokalemia: Secondary | ICD-10-CM | POA: Diagnosis present

## 2024-01-14 DIAGNOSIS — F17210 Nicotine dependence, cigarettes, uncomplicated: Secondary | ICD-10-CM | POA: Diagnosis present

## 2024-01-14 DIAGNOSIS — Z9049 Acquired absence of other specified parts of digestive tract: Secondary | ICD-10-CM

## 2024-01-14 DIAGNOSIS — I110 Hypertensive heart disease with heart failure: Principal | ICD-10-CM | POA: Diagnosis present

## 2024-01-14 DIAGNOSIS — Z79899 Other long term (current) drug therapy: Secondary | ICD-10-CM

## 2024-01-14 DIAGNOSIS — K5730 Diverticulosis of large intestine without perforation or abscess without bleeding: Secondary | ICD-10-CM | POA: Diagnosis present

## 2024-01-14 DIAGNOSIS — Z7901 Long term (current) use of anticoagulants: Secondary | ICD-10-CM

## 2024-01-14 DIAGNOSIS — F32A Depression, unspecified: Secondary | ICD-10-CM | POA: Diagnosis present

## 2024-01-14 DIAGNOSIS — Z22322 Carrier or suspected carrier of Methicillin resistant Staphylococcus aureus: Secondary | ICD-10-CM

## 2024-01-14 DIAGNOSIS — E1151 Type 2 diabetes mellitus with diabetic peripheral angiopathy without gangrene: Secondary | ICD-10-CM | POA: Diagnosis present

## 2024-01-14 DIAGNOSIS — R1310 Dysphagia, unspecified: Secondary | ICD-10-CM | POA: Diagnosis not present

## 2024-01-14 DIAGNOSIS — Z8679 Personal history of other diseases of the circulatory system: Secondary | ICD-10-CM | POA: Diagnosis not present

## 2024-01-14 DIAGNOSIS — D649 Anemia, unspecified: Secondary | ICD-10-CM | POA: Diagnosis not present

## 2024-01-14 DIAGNOSIS — K2100 Gastro-esophageal reflux disease with esophagitis, without bleeding: Secondary | ICD-10-CM | POA: Diagnosis present

## 2024-01-14 DIAGNOSIS — K224 Dyskinesia of esophagus: Secondary | ICD-10-CM | POA: Diagnosis present

## 2024-01-14 DIAGNOSIS — Z90710 Acquired absence of both cervix and uterus: Secondary | ICD-10-CM | POA: Diagnosis not present

## 2024-01-14 DIAGNOSIS — Z7902 Long term (current) use of antithrombotics/antiplatelets: Secondary | ICD-10-CM | POA: Diagnosis not present

## 2024-01-14 DIAGNOSIS — Z8249 Family history of ischemic heart disease and other diseases of the circulatory system: Secondary | ICD-10-CM

## 2024-01-14 DIAGNOSIS — R1319 Other dysphagia: Secondary | ICD-10-CM | POA: Diagnosis not present

## 2024-01-14 DIAGNOSIS — I509 Heart failure, unspecified: Principal | ICD-10-CM

## 2024-01-14 DIAGNOSIS — K625 Hemorrhage of anus and rectum: Secondary | ICD-10-CM | POA: Diagnosis not present

## 2024-01-14 DIAGNOSIS — R0609 Other forms of dyspnea: Secondary | ICD-10-CM | POA: Diagnosis not present

## 2024-01-14 LAB — CBC WITH DIFFERENTIAL/PLATELET
Abs Immature Granulocytes: 0.01 K/uL (ref 0.00–0.07)
Basophils Absolute: 0.1 K/uL (ref 0.0–0.1)
Basophils Relative: 2 %
Eosinophils Absolute: 0 K/uL (ref 0.0–0.5)
Eosinophils Relative: 0 %
HCT: 27 % — ABNORMAL LOW (ref 36.0–46.0)
Hemoglobin: 9 g/dL — ABNORMAL LOW (ref 12.0–15.0)
Immature Granulocytes: 0 %
Lymphocytes Relative: 39 %
Lymphs Abs: 1.5 K/uL (ref 0.7–4.0)
MCH: 34.2 pg — ABNORMAL HIGH (ref 26.0–34.0)
MCHC: 33.3 g/dL (ref 30.0–36.0)
MCV: 102.7 fL — ABNORMAL HIGH (ref 80.0–100.0)
Monocytes Absolute: 0.2 K/uL (ref 0.1–1.0)
Monocytes Relative: 6 %
Neutro Abs: 2 K/uL (ref 1.7–7.7)
Neutrophils Relative %: 53 %
Platelets: 247 K/uL (ref 150–400)
RBC: 2.63 MIL/uL — ABNORMAL LOW (ref 3.87–5.11)
RDW: 23.2 % — ABNORMAL HIGH (ref 11.5–15.5)
WBC: 3.8 K/uL — ABNORMAL LOW (ref 4.0–10.5)
nRBC: 1.6 % — ABNORMAL HIGH (ref 0.0–0.2)

## 2024-01-14 LAB — CBG MONITORING, ED
Glucose-Capillary: 52 mg/dL — ABNORMAL LOW (ref 70–99)
Glucose-Capillary: 76 mg/dL (ref 70–99)

## 2024-01-14 LAB — I-STAT CG4 LACTIC ACID, ED: Lactic Acid, Venous: 1.4 mmol/L (ref 0.5–1.9)

## 2024-01-14 LAB — COMPREHENSIVE METABOLIC PANEL WITH GFR
ALT: 50 U/L — ABNORMAL HIGH (ref 0–44)
AST: 45 U/L — ABNORMAL HIGH (ref 15–41)
Albumin: 2.9 g/dL — ABNORMAL LOW (ref 3.5–5.0)
Alkaline Phosphatase: 95 U/L (ref 38–126)
Anion gap: 15 (ref 5–15)
BUN: 8 mg/dL (ref 8–23)
CO2: 15 mmol/L — ABNORMAL LOW (ref 22–32)
Calcium: 8.2 mg/dL — ABNORMAL LOW (ref 8.9–10.3)
Chloride: 104 mmol/L (ref 98–111)
Creatinine, Ser: 1.03 mg/dL — ABNORMAL HIGH (ref 0.44–1.00)
GFR, Estimated: 59 mL/min — ABNORMAL LOW
Glucose, Bld: 61 mg/dL — ABNORMAL LOW (ref 70–99)
Potassium: 4.6 mmol/L (ref 3.5–5.1)
Sodium: 133 mmol/L — ABNORMAL LOW (ref 135–145)
Total Bilirubin: 1.2 mg/dL (ref 0.0–1.2)
Total Protein: 5.7 g/dL — ABNORMAL LOW (ref 6.5–8.1)

## 2024-01-14 LAB — TROPONIN T, HIGH SENSITIVITY
Troponin T High Sensitivity: 33 ng/L — ABNORMAL HIGH (ref 0–19)
Troponin T High Sensitivity: 28 ng/L — ABNORMAL HIGH (ref 0–19)

## 2024-01-14 LAB — PRO BRAIN NATRIURETIC PEPTIDE: Pro Brain Natriuretic Peptide: 1157 pg/mL — ABNORMAL HIGH

## 2024-01-14 MED ORDER — ONDANSETRON HCL 4 MG/2ML IJ SOLN: 4.0000 mg | Freq: Four times a day (QID) | INTRAMUSCULAR | Status: DC | PRN

## 2024-01-14 MED ORDER — FUROSEMIDE 10 MG/ML IJ SOLN: 40.0000 mg | Freq: Once | INTRAMUSCULAR | Status: DC

## 2024-01-14 MED ORDER — ACETAMINOPHEN 650 MG RE SUPP: 650.0000 mg | Freq: Four times a day (QID) | RECTAL | Status: DC | PRN

## 2024-01-14 MED ORDER — FUROSEMIDE 10 MG/ML IJ SOLN
20.0000 mg | Freq: Once | INTRAMUSCULAR | Status: AC
  Administered 2024-01-14: 20 mg via INTRAVENOUS
  Filled 2024-01-14: qty 2

## 2024-01-14 MED ORDER — ONDANSETRON HCL 4 MG PO TABS
4.0000 mg | ORAL_TABLET | Freq: Four times a day (QID) | ORAL | Status: DC | PRN
  Administered 2024-01-17: 4 mg via ORAL
  Filled 2024-01-14: qty 1

## 2024-01-14 MED ORDER — BISACODYL 5 MG PO TBEC: 5.0000 mg | DELAYED_RELEASE_TABLET | Freq: Every day | ORAL | Status: DC | PRN

## 2024-01-14 MED ORDER — SENNOSIDES-DOCUSATE SODIUM 8.6-50 MG PO TABS: 1.0000 | ORAL_TABLET | Freq: Every evening | ORAL | Status: DC | PRN

## 2024-01-14 MED ORDER — ACETAMINOPHEN 325 MG PO TABS: 650.0000 mg | ORAL_TABLET | Freq: Four times a day (QID) | ORAL | Status: DC | PRN

## 2024-01-14 NOTE — ED Triage Notes (Signed)
 Patient BIB [facility] from home for bilateral leg swelling since Nov. Patient went to hospital then and was told it would resolve and it has not. EMS endorses shortness of breath with standing and lower lobe crackles bilaterally, no known hx of HF.  BP 94/62 HR 120 98% RA CBG 98

## 2024-01-14 NOTE — H&P (Signed)
 " History and Physical  Angie Carr FMW:[id number] DOB: [birth year] DOA: 01/14/2024  PCP: Benjamine Aland, MD   Chief Complaint: SOB, leg swelling   HPI: Angie Carr is a 69 y.o. female with medical history significant for HTN, T2DM, PE on Eliquis , PAD, GERD, DJD, arthritis and anemia who presented to the ED for evaluation shortness of breath and leg swelling.  ED Course: Initial vitals show pt afebrile, HR 90-110s, SBP 100-110s, SpO2 98% on RA. Initial labs significant for Na 133, K+ 4.6, bicarb 15, sCr 1.03 (hemolyzed CMP), WBC 3.8, hgb 9.0, proBNP 1157, troponin 33->28,. EKG shows sinus tach. CXR shows no active disease. Pt received lasix  20 mg x1. TRH was consulted for admission.   Review of Systems: Please see HPI for pertinent positives and negatives. A complete 10 system review of systems are otherwise negative.  Past Medical History:  Diagnosis Date   Anemia    Anxiety    Asthma    pt denies   Diabetes mellitus without complication (HCC)    DJD (degenerative joint disease)    lower back   GERD (gastroesophageal reflux disease)    Headache    miagraines, none in years   Hypertension    LBP (low back pain)    Morbid obesity (HCC)    Neuromuscular disorder (HCC)    feet tingle and her balance is sometimes a problem   Primary osteoarthritis of both knees    Smoker    Past Surgical History:  Procedure Laterality Date   ABDOMINAL HYSTERECTOMY     complete   CHOLECYSTECTOMY     laparascopic   COLONOSCOPY N/A 02/11/2014   Procedure: COLONOSCOPY;  Surgeon: Lamar Donnald GAILS, MD;  Location: [facility] ENDOSCOPY;  Service: Endoscopy;  Laterality: N/A;   ESOPHAGOGASTRODUODENOSCOPY N/A 11/20/2023   Procedure: EGD (ESOPHAGOGASTRODUODENOSCOPY);  Surgeon: San Sandor GAILS, DO;  Location: [facility] ENDOSCOPY;  Service: Gastroenterology;  Laterality: N/A;  with possible dilation   HERNIA REPAIR     ventral on left side.   TOTAL HIP ARTHROPLASTY Left 07/24/2023   Procedure: ARTHROPLASTY,  HIP, TOTAL,POSTERIOR APPROACH;  Surgeon: Edna Toribio LABOR, MD;  Location: [facility] ORS;  Service: Orthopedics;  Laterality: Left;   TUBAL LIGATION     Social History:  reports that she has been smoking cigarettes. She has never used smokeless tobacco. She reports current drug use. Drug: Marijuana. She reports that she does not drink alcohol .  Allergies[1]  Family History  Problem Relation Age of Onset   Hypertension Other    Stroke Other      Prior to Admission medications  Medication Sig Start Date End Date Taking? Authorizing Provider  apixaban  (ELIQUIS ) 5 MG TABS tablet Take 2 tablets (10 mg total) by mouth 2 (two) times daily for 7 days, THEN 1 tablet (5 mg total) 2 (two) times daily. 08/30/23 12/24/23  Willette Adriana LABOR, MD  cilostazol  (PLETAL ) 50 MG tablet Take 50 mg by mouth 2 (two) times daily.    [provider]  magnesium  oxide (MAG-OX) 400 (240 Mg) MG tablet Take 2 tablets (800 mg total) by mouth daily. 11/22/23   Gherghe, Costin M, MD  mirtazapine  (REMERON ) 7.5 MG tablet Take 7.5 mg by mouth daily.    [provider]  omeprazole  (PRILOSEC) 20 MG capsule Take 20 mg by mouth daily. 11/07/23   [provider]  potassium chloride  SA (KLOR-CON  M) 20 MEQ tablet Take 1 tablet (20 mEq total) by mouth in the morning. 11/21/23   Gherghe, Costin M,  MD  pravastatin  (PRAVACHOL ) 40 MG tablet Take 40 mg by mouth daily in the afternoon.    [provider]    Physical Exam: BP 114/71   Pulse 98   Temp 97.7 F (36.5 C) (Oral)   Resp 18   Ht 5' 4 (1.626 m)   Wt 77.9 kg   SpO2 100%   BMI 29.48 kg/m  General: Pleasant, well-appearing *** laying in bed. No acute distress. HEENT: [place]/AT. Anicteric sclera CV: RRR. No murmurs, rubs, or gallops. No LE edema Pulmonary: Lungs CTAB. Normal effort. No wheezing or rales. Abdominal: Soft, nontender, nondistended. Normal bowel sounds. Extremities: Palpable radial and DP pulses. Normal ROM. Skin: Warm and dry. No  obvious rash or lesions. Neuro: A&Ox3. Moves all extremities. Normal sensation to light touch. No focal deficit. Psych: Normal mood and affect          Labs on Admission:  Basic Metabolic Panel: Recent Labs  Lab 01/14/24 1327  NA 133*  K 4.6  CL 104  CO2 15*  GLUCOSE 61*  BUN 8  CREATININE 1.03*  CALCIUM  8.2*   Liver Function Tests: Recent Labs  Lab 01/14/24 1327  AST 45*  ALT 50*  ALKPHOS 95  BILITOT 1.2  PROT 5.7*  ALBUMIN 2.9*   No results for input(s): LIPASE, AMYLASE in the last 168 hours. No results for input(s): AMMONIA in the last 168 hours. CBC: Recent Labs  Lab 01/14/24 1327  WBC 3.8*  NEUTROABS 2.0  HGB 9.0*  HCT 27.0*  MCV 102.7*  PLT 247   Cardiac Enzymes: No results for input(s): CKTOTAL, CKMB, CKMBINDEX, TROPONINI in the last 168 hours. BNP (last 3 results) No results for input(s): BNP in the last 8760 hours.  ProBNP (last 3 results) Recent Labs    01/14/24 1327  PROBNP 1,157.0*    CBG: No results for input(s): GLUCAP in the last 168 hours.  Radiological Exams on Admission: DG Chest 2 View Result Date: 01/14/2024 CLINICAL DATA:  Shortness of breath. EXAM: CHEST - 2 VIEW COMPARISON:  CTA chest dated 11/13/2023. Chest radiograph dated 09/16/2023. FINDINGS: The heart size and mediastinal contours are unchanged. Aortic atherosclerosis. No overt pulmonary edema. No focal consolidation, pleural effusion, or pneumothorax. Degenerative changes of the thoracic spine and bilateral glenohumeral joints. IMPRESSION: No acute cardiopulmonary findings. Electronically Signed   By: Harrietta Sherry M.D.   On: 01/14/2024 14:19   Assessment/Plan Angie Carr is a 70 y.o. female with medical history significant for HTN, T2DM, PE on Eliquis , PAD, GERD, DJD, arthritis and anemia who presented to the ED for evaluation shortness of breath and leg swelling and admitted for heart failure exacerbation.    # Acute on chronic heart  failure - Last TTE on 08/2023 shows EF 55-60%, Moderate LVH, and G1DD  - Pt presented with *** - Pt with *** signs of CHF exacerbation - Acute CHF likely 2/2 *** - Start IV lasix  *** - Continue *** - Follow up repeat *** - Strict I&O, daily weights - Maintain K+ > 4.0, Mag > 2.0 - Telemetry  # T2DM - Well-controlled, last A1c 4.8% 5 months ago - SSI with meals, CBG monitoring   # Hx of pulmonary embolism - Continue Eliquis    # AAA - CTA chest in Nov 2025 showed a 4.1 cm ascending aortic aneurysm - Needs annual imaging follow-up by CTA or MRA  # Esophageal dysphia   # PAD - Continue cilostazol    # GERD - Continue PPI   #  Anxiety - Continue mirtazapine   DVT prophylaxis: Eliquis     Code Status: Full Code  Consults called: None  Family Communication: ***  Severity of Illness: The appropriate patient status for this patient is INPATIENT. Inpatient status is judged to be reasonable and necessary in order to provide the required intensity of service to ensure the patient's safety. The patient's presenting symptoms, physical exam findings, and initial radiographic and laboratory data in the context of their chronic comorbidities is felt to place them at high risk for further clinical deterioration. Furthermore, it is not anticipated that the patient will be medically stable for discharge from the hospital within 2 midnights of admission.   * I certify that at the point of admission it is my clinical judgment that the patient will require inpatient hospital care spanning beyond 2 midnights from the point of admission due to high intensity of service, high risk for further deterioration and high frequency of surveillance required.*  Level of care: Telemetry    Lou Claretta HERO, MD 01/14/2024, 7:17 PM [facility] Pager: [phone] Isaiah 41:10   If 7PM-7AM, please contact night-coverage [email or web address] Password [email or web address]     [1] No Known Allergies  "

## 2024-01-14 NOTE — ED Provider Notes (Signed)
 " [facility] [place] [facility] Provider Note   CSN: [id number] Arrival date & time: 01/14/24  1238     Patient presents with: Leg Swelling   Angie Carr is a 69 y.o. female history of hypertension, diabetes, PE on Eliquis , PAD aortic aneurysm presents with complaints of shortness of breath and bilateral lower extremity edema.  Reports compliance with her Eliquis .  States that her diuretic was discontinued a few months ago.   HPI  Past Medical History:  Diagnosis Date   Anemia    Anxiety    Asthma    pt denies   Diabetes mellitus without complication (HCC)    DJD (degenerative joint disease)    lower back   GERD (gastroesophageal reflux disease)    Headache    miagraines, none in years   Hypertension    LBP (low back pain)    Morbid obesity (HCC)    Neuromuscular disorder (HCC)    feet tingle and her balance is sometimes a problem   Primary osteoarthritis of both knees    Smoker    Past Surgical History:  Procedure Laterality Date   ABDOMINAL HYSTERECTOMY     complete   CHOLECYSTECTOMY     laparascopic   COLONOSCOPY N/A 02/11/2014   Procedure: COLONOSCOPY;  Surgeon: Lamar Donnald GAILS, MD;  Location: [facility] ENDOSCOPY;  Service: Endoscopy;  Laterality: N/A;   ESOPHAGOGASTRODUODENOSCOPY N/A 11/20/2023   Procedure: EGD (ESOPHAGOGASTRODUODENOSCOPY);  Surgeon: San Sandor GAILS, DO;  Location: [facility] ENDOSCOPY;  Service: Gastroenterology;  Laterality: N/A;  with possible dilation   HERNIA REPAIR     ventral on left side.   TOTAL HIP ARTHROPLASTY Left 07/24/2023   Procedure: ARTHROPLASTY, HIP, TOTAL,POSTERIOR APPROACH;  Surgeon: Edna Toribio LABOR, MD;  Location: [facility] ORS;  Service: Orthopedics;  Laterality: Left;   TUBAL LIGATION         Prior to Admission medications  Medication Sig Start Date End Date Taking? Authorizing Provider  apixaban  (ELIQUIS ) 5 MG TABS tablet Take 2 tablets (10 mg total) by mouth 2 (two) times daily for 7 days, THEN 1  tablet (5 mg total) 2 (two) times daily. 08/30/23 12/24/23  Willette Adriana LABOR, MD  cilostazol  (PLETAL ) 50 MG tablet Take 50 mg by mouth 2 (two) times daily.    [provider]  magnesium  oxide (MAG-OX) 400 (240 Mg) MG tablet Take 2 tablets (800 mg total) by mouth daily. 11/22/23   Gherghe, Costin M, MD  mirtazapine  (REMERON ) 7.5 MG tablet Take 7.5 mg by mouth daily.    [provider]  omeprazole  (PRILOSEC) 20 MG capsule Take 20 mg by mouth daily. 11/07/23   [provider]  potassium chloride  SA (KLOR-CON  M) 20 MEQ tablet Take 1 tablet (20 mEq total) by mouth in the morning. 11/21/23   Gherghe, Costin M, MD  pravastatin  (PRAVACHOL ) 40 MG tablet Take 40 mg by mouth daily in the afternoon.    [provider]    Allergies: Patient has no known allergies.    Review of Systems  Respiratory:  Positive for shortness of breath.     Updated Vital Signs BP 114/71   Pulse 98   Temp 97.7 F (36.5 C) (Oral)   Resp 18   Ht 5' 4 (1.626 m)   Wt 77.9 kg   SpO2 100%   BMI 29.48 kg/m   Physical Exam Vitals and nursing note reviewed.  Constitutional:      General: She is not in acute distress.  Appearance: She is well-developed.  HENT:     Head: Normocephalic and atraumatic.  Eyes:     Conjunctiva/sclera: Conjunctivae normal.  Cardiovascular:     Rate and Rhythm: Normal rate and regular rhythm.     Heart sounds: No murmur heard. Pulmonary:     Effort: Pulmonary effort is normal. No respiratory distress.     Breath sounds: Normal breath sounds.  Abdominal:     Palpations: Abdomen is soft.     Tenderness: There is no abdominal tenderness.  Musculoskeletal:        General: Swelling present.     Cervical back: Neck supple.     Comments: 1+ bilateral lower extremity pitting edema  Skin:    General: Skin is warm and dry.     Capillary Refill: Capillary refill takes less than 2 seconds.  Neurological:     Mental Status: She is alert.  Psychiatric:         Mood and Affect: Mood normal.     (all labs ordered are listed, but only abnormal results are displayed) Labs Reviewed  PRO BRAIN NATRIURETIC PEPTIDE - Abnormal; Notable for the following components:      Result Value   Pro Brain Natriuretic Peptide 1,157.0 (*)    All other components within normal limits  CBC WITH DIFFERENTIAL/PLATELET - Abnormal; Notable for the following components:   WBC 3.8 (*)    RBC 2.63 (*)    Hemoglobin 9.0 (*)    HCT 27.0 (*)    MCV 102.7 (*)    MCH 34.2 (*)    RDW 23.2 (*)    nRBC 1.6 (*)    All other components within normal limits  COMPREHENSIVE METABOLIC PANEL WITH GFR - Abnormal; Notable for the following components:   Sodium 133 (*)    CO2 15 (*)    Glucose, Bld 61 (*)    Creatinine, Ser 1.03 (*)    Calcium  8.2 (*)    Total Protein 5.7 (*)    Albumin 2.9 (*)    AST 45 (*)    ALT 50 (*)    GFR, Estimated 59 (*)    All other components within normal limits  TROPONIN T, HIGH SENSITIVITY - Abnormal; Notable for the following components:   Troponin T High Sensitivity 33 (*)    All other components within normal limits  TROPONIN T, HIGH SENSITIVITY - Abnormal; Notable for the following components:   Troponin T High Sensitivity 28 (*)    All other components within normal limits  ETHANOL  I-STAT CG4 LACTIC ACID, ED    EKG: EKG Interpretation Date/Time:  Tuesday January 14 2024 13:05:14 EST Ventricular Rate:  113 PR Interval:  138 QRS Duration:  64 QT Interval:  318 QTC Calculation: 436 R Axis:   31  Text Interpretation: Sinus tachycardia Low voltage QRS Cannot rule out Anterior infarct , age undetermined ST & T wave abnormality, consider lateral ischemia Abnormal ECG When compared with ECG of 13-Nov-2023 16:36, No significant change since last tracing Confirmed by Towana Sharper [id number]) on 01/14/2024 1:17:33 PM  Radiology: [facility] Chest 2 View Result Date: 01/14/2024 CLINICAL DATA:  Shortness of breath. EXAM: CHEST - 2 VIEW COMPARISON:   CTA chest dated 11/13/2023. Chest radiograph dated 09/16/2023. FINDINGS: The heart size and mediastinal contours are unchanged. Aortic atherosclerosis. No overt pulmonary edema. No focal consolidation, pleural effusion, or pneumothorax. Degenerative changes of the thoracic spine and bilateral glenohumeral joints. IMPRESSION: No acute cardiopulmonary findings. Electronically Signed   By: Harrietta  Lateef M.D.   On: 01/14/2024 14:19     Procedures   Medications Ordered in the ED  furosemide  (LASIX ) injection 20 mg (has no administration in time range)    Clinical Course as of 01/14/24 1916  Tue Jan 14, 2024  1721 Patient with history of CHF, PE on Eliquis  evaluated for complaints of shortness of breath with bilateral lower extremity edema since discontinuing her diuretic a few months ago.  Upon arrival patient is tachycardic, blood pressures are soft, otherwise hemodynamically stable. [JT]  1722 Comprehensive metabolic panel(!) Mild transaminitis, bicarb of 15, [JT]  1725 Pro Brain natriuretic peptide(!) Elevated to 1157 [JT]  1726 Troponin T, High Sensitivity(!) Elevated to 33, no active chest pain.  Likely demand.  Will trend [JT]  1726 DG Chest 2 View No active cardiopulmonary disease [JT]  1726 ED EKG Sinus tachycardia, low voltage [JT]  1824 Workup overall consistent with acute decompensated heart failure. Pro BNP was moderately elevated, however with softer pressures will pursue admission to safely begin diuresis [JT]  1915 Discussed patient with hospitalist, agreed for admission [JT]    Clinical Course User Index [JT] Donnajean Lynwood DEL, PA-C                                 Medical Decision Making  This patient presents to the ED with chief complaint(s) of Shortness of breath .  The complaint involves an extensive differential diagnosis and also carries with it a high risk of complications and morbidity.   Pertinent past medical history as listed in HPI  The differential  diagnosis includes  Troponin flat, EKG without ischemic changes do not suspect ACS.  Is compliant with her Eliquis .  Do not suspect PE.  No leukocytosis, afebrile, no consolidation doubt pneumonia.  Additional history obtained: Records reviewed Care Everywhere/External Records  Disposition:   Patient admitted  Social Determinants of Health:   none  This note was dictated with voice recognition software.  Despite best efforts at proofreading, errors may have occurred which can change the documentation meaning.       Final diagnoses:  Acute on chronic congestive heart failure, unspecified heart failure type [facility])    ED Discharge Orders     None          Donnajean Lynwood DEL DEVONNA 01/14/24 1916    Tegeler, Lonni PARAS, MD 01/14/24 2239  "

## 2024-01-14 NOTE — ED Notes (Signed)
 Patients CBG 52, pt given juice to drink.

## 2024-01-14 NOTE — ED Provider Triage Note (Signed)
 Emergency Medicine Provider Triage Evaluation Note  Angie Carr , a 69 y.o. female  was evaluated in triage.  Pt complains of continued leg swelling going on for a few months since hospital discharge.  Now also feeling short of breath for the last few days.  No chest pain or fever.  Review of Systems  Positive: Leg swelling Negative: Chest pain  Physical Exam  BP 105/69   Pulse (!) 112   Temp 98 F (36.7 C)   Resp 16   SpO2 98%  Gen:   Awake, no distress   Resp:  Normal effort  MSK:   Moves extremities without difficulty  Other:    Medical Decision Making  Medically screening exam initiated at 1:02 PM.  Appropriate orders placed.  Angie Carr was informed that the remainder of the evaluation will be completed by another provider, this initial triage assessment does not replace that evaluation, and the importance of remaining in the ED until their evaluation is complete.     Towana Ozell BROCKS, MD 01/14/24 [phone]

## 2024-01-14 NOTE — ED Notes (Signed)
 CCMD called for continuous cardiac monitoring.

## 2024-01-15 ENCOUNTER — Telehealth: Admitting: *Deleted

## 2024-01-15 ENCOUNTER — Inpatient Hospital Stay (HOSPITAL_COMMUNITY)

## 2024-01-15 ENCOUNTER — Telehealth: Payer: Self-pay

## 2024-01-15 DIAGNOSIS — R1319 Other dysphagia: Secondary | ICD-10-CM | POA: Diagnosis not present

## 2024-01-15 DIAGNOSIS — Z7901 Long term (current) use of anticoagulants: Secondary | ICD-10-CM | POA: Diagnosis not present

## 2024-01-15 DIAGNOSIS — K449 Diaphragmatic hernia without obstruction or gangrene: Secondary | ICD-10-CM

## 2024-01-15 DIAGNOSIS — D649 Anemia, unspecified: Secondary | ICD-10-CM | POA: Diagnosis not present

## 2024-01-15 DIAGNOSIS — K219 Gastro-esophageal reflux disease without esophagitis: Secondary | ICD-10-CM

## 2024-01-15 DIAGNOSIS — Z8679 Personal history of other diseases of the circulatory system: Secondary | ICD-10-CM

## 2024-01-15 DIAGNOSIS — I5033 Acute on chronic diastolic (congestive) heart failure: Secondary | ICD-10-CM | POA: Diagnosis not present

## 2024-01-15 DIAGNOSIS — R0609 Other forms of dyspnea: Secondary | ICD-10-CM | POA: Diagnosis not present

## 2024-01-15 LAB — COMPREHENSIVE METABOLIC PANEL WITH GFR
ALT: 38 U/L (ref 0–44)
AST: 32 U/L (ref 15–41)
Albumin: 2.3 g/dL — ABNORMAL LOW (ref 3.5–5.0)
Alkaline Phosphatase: 71 U/L (ref 38–126)
Anion gap: 10 (ref 5–15)
BUN: 10 mg/dL (ref 8–23)
CO2: 19 mmol/L — ABNORMAL LOW (ref 22–32)
Calcium: 7.4 mg/dL — ABNORMAL LOW (ref 8.9–10.3)
Chloride: 106 mmol/L (ref 98–111)
Creatinine, Ser: 1 mg/dL (ref 0.44–1.00)
GFR, Estimated: >60 mL/min
Glucose, Bld: 59 mg/dL — ABNORMAL LOW (ref 70–99)
Potassium: 3.5 mmol/L (ref 3.5–5.1)
Sodium: 136 mmol/L (ref 135–145)
Total Bilirubin: 0.8 mg/dL (ref 0.0–1.2)
Total Protein: 4.2 g/dL — ABNORMAL LOW (ref 6.5–8.1)

## 2024-01-15 LAB — ECHOCARDIOGRAM COMPLETE
Area-P 1/2: 4.68 cm2
Height: 64 in
S' Lateral: 3.3 cm
Weight: 2747.81 [oz_av]

## 2024-01-15 LAB — CBC
HCT: 20.3 % — ABNORMAL LOW (ref 36.0–46.0)
Hemoglobin: 7 g/dL — ABNORMAL LOW (ref 12.0–15.0)
MCH: 34 pg (ref 26.0–34.0)
MCHC: 34.5 g/dL (ref 30.0–36.0)
MCV: 98.5 fL (ref 80.0–100.0)
Platelets: 195 K/uL (ref 150–400)
RBC: 2.06 MIL/uL — ABNORMAL LOW (ref 3.87–5.11)
RDW: 22.9 % — ABNORMAL HIGH (ref 11.5–15.5)
WBC: 4 K/uL (ref 4.0–10.5)
nRBC: 0.5 % — ABNORMAL HIGH (ref 0.0–0.2)

## 2024-01-15 LAB — RETICULOCYTES
Immature Retic Fract: 23.7 % — ABNORMAL HIGH (ref 2.3–15.9)
RBC.: 2.19 MIL/uL — ABNORMAL LOW (ref 3.87–5.11)
Retic Count, Absolute: 69.6 K/uL (ref 19.0–186.0)
Retic Ct Pct: 3.2 % — ABNORMAL HIGH (ref 0.4–3.1)

## 2024-01-15 LAB — MAGNESIUM: Magnesium: 1.6 mg/dL — ABNORMAL LOW (ref 1.7–2.4)

## 2024-01-15 LAB — FERRITIN: Ferritin: 463 ng/mL — ABNORMAL HIGH (ref 11–307)

## 2024-01-15 LAB — PREPARE RBC (CROSSMATCH)

## 2024-01-15 LAB — IRON AND TIBC: Iron: 74 ug/dL (ref 28–170)

## 2024-01-15 LAB — CBG MONITORING, ED
Glucose-Capillary: 56 mg/dL — ABNORMAL LOW (ref 70–99)
Glucose-Capillary: 62 mg/dL — ABNORMAL LOW (ref 70–99)
Glucose-Capillary: 127 mg/dL — ABNORMAL HIGH (ref 70–99)
Glucose-Capillary: 119 mg/dL — ABNORMAL HIGH (ref 70–99)

## 2024-01-15 LAB — ETHANOL: Alcohol, Ethyl (B): <15 mg/dL

## 2024-01-15 LAB — GLUCOSE, CAPILLARY: Glucose-Capillary: 70 mg/dL (ref 70–99)

## 2024-01-15 LAB — VITAMIN B12: Vitamin B-12: 1001 pg/mL — ABNORMAL HIGH (ref 180–914)

## 2024-01-15 LAB — HEMOGLOBIN AND HEMATOCRIT, BLOOD
HCT: 25.9 % — ABNORMAL LOW (ref 36.0–46.0)
Hemoglobin: 9.1 g/dL — ABNORMAL LOW (ref 12.0–15.0)

## 2024-01-15 LAB — FOLATE: Folate: 13.5 ng/mL

## 2024-01-15 MED ORDER — FUROSEMIDE 10 MG/ML IJ SOLN: 20.0000 mg | Freq: Two times a day (BID) | INTRAMUSCULAR | Status: DC

## 2024-01-15 MED ORDER — PANTOPRAZOLE SODIUM 40 MG PO TBEC
40.0000 mg | DELAYED_RELEASE_TABLET | Freq: Every day | ORAL | Status: DC
  Administered 2024-01-15 – 2024-01-18 (×4): 40 mg via ORAL
  Filled 2024-01-15 (×4): qty 1

## 2024-01-15 MED ORDER — APIXABAN 5 MG PO TABS
5.0000 mg | ORAL_TABLET | Freq: Two times a day (BID) | ORAL | Status: DC
  Administered 2024-01-15 – 2024-01-16 (×4): 5 mg via ORAL
  Filled 2024-01-15 (×4): qty 1

## 2024-01-15 MED ORDER — POTASSIUM CHLORIDE CRYS ER 20 MEQ PO TBCR
20.0000 meq | EXTENDED_RELEASE_TABLET | Freq: Every day | ORAL | Status: DC
  Administered 2024-01-15: 20 meq via ORAL
  Filled 2024-01-15: qty 1

## 2024-01-15 MED ORDER — PRAVASTATIN SODIUM 40 MG PO TABS
40.0000 mg | ORAL_TABLET | Freq: Every evening | ORAL | Status: DC
  Administered 2024-01-15 – 2024-01-17 (×3): 40 mg via ORAL
  Filled 2024-01-15 (×3): qty 1

## 2024-01-15 MED ORDER — FUROSEMIDE 10 MG/ML IJ SOLN
40.0000 mg | Freq: Two times a day (BID) | INTRAMUSCULAR | Status: DC
  Administered 2024-01-15 – 2024-01-16 (×4): 40 mg via INTRAVENOUS
  Filled 2024-01-15 (×5): qty 4

## 2024-01-15 MED ORDER — DEXTROSE 50 % IV SOLN
25.0000 mL | Freq: Once | INTRAVENOUS | Status: AC
  Administered 2024-01-15: 25 mL via INTRAVENOUS
  Filled 2024-01-15: qty 50

## 2024-01-15 MED ORDER — SODIUM CHLORIDE 0.9% IV SOLUTION: Freq: Once | INTRAVENOUS | Status: AC

## 2024-01-15 MED ORDER — CILOSTAZOL 50 MG PO TABS
50.0000 mg | ORAL_TABLET | Freq: Two times a day (BID) | ORAL | Status: DC
  Administered 2024-01-15 – 2024-01-18 (×8): 50 mg via ORAL
  Filled 2024-01-15 (×10): qty 1

## 2024-01-15 NOTE — Patient Instructions (Signed)
 Angie Carr - I am sorry I was unable to reach you today for our scheduled appointment. I work with Benjamine Aland, MD and am calling to support your healthcare needs. Please contact me at [phone] at your earliest convenience. I look forward to speaking with you soon.   Warren Quivers RN CM Population Health-Complex Care Management [facility] [phone]

## 2024-01-15 NOTE — Progress Notes (Addendum)
 " PROGRESS NOTE    Angie Carr  FMW:[id number] DOB: Jun 29, 1955 DOA: 01/14/2024 PCP: Benjamine Aland, MD  68/F w esophageal stenosis, recent dilation, history of PE on Eliquis , type 2 diabetes mellitus, hypertension, DJD, chronic anemia presented to the ED with shortness of breath and leg swelling over the last few weeks. -Patient reports having her esophagus stretched in November, felt better for a few days and then shortly thereafter started having difficulty swallowing again, reports very poor p.o. intake and failure to thrive as a result.  Few weeks ago developed leg swelling and some shortness of breath.  In the ED mildly tachycardic, labs sodium 1333, creatinine 1.03, hemoglobin 9, proBNP 1157, troponin 33, 28 chest x-ray with no active disease. - Repeat hemoglobin dropped to 7  Subjective: -Feels weak, ongoing issues with dysphagia, oral intake has been very poor  Assessment and Plan:  Acute on chronic diastolic heart failure - Last TTE on 08/2023 shows EF 55-60%, Moderate LVH, and G1DD  -Likely exacerbated by anemia -Continue IV Lasix  today, increased dose to 40 mg twice daily -Add Jardiance - BMP in a.m.  Acute on chronic anemia -Hemoglobin lower than baseline, normocytic -Check anemia panel, will transfuse 1 unit PRBC today -Trend hemoglobin - Reports having a colonoscopy in the last few months which was allegedly unremarkable, through [facility] medical - On Eliquis  with history of PE  Esophageal stenosis - Noted on endoscopy 11/20/2023, sp dilation - Biopsy done noted candidiasis, negative for malignancy - She is unclear if treated with antifungals - With worsening symptoms again, will request GI eval  T2DM - Well-controlled, last A1c 4.8% 5 months ago - SSI with meals, CBG monitoring   # Hx of pulmonary embolism - Continue Eliquis    # AAA - CTA chest in Nov 2025 showed a 4.1 cm ascending aortic aneurysm - Needs annual imaging follow-up by CTA or MRA   # PAD -  Continue cilostazol    # HLD -Continue pravastatin    # GERD - Continue PPI     DVT prophylaxis:eliquis  Code Status: Full Code Family Communication: none present Disposition Plan: Home pending above workup  Consultants: GI   Procedures:   Antimicrobials:    Objective: Vitals:   01/15/24 0700 01/15/24 0846 01/15/24 1045 01/15/24 1100  BP: 109/66 111/76 93/66 102/66  Pulse: 93 93 92 99  Resp: 13 13 18 20   Temp:  98.2 F (36.8 C) 98.1 F (36.7 C) 97.9 F (36.6 C)  TempSrc:  Oral Oral Oral  SpO2: 100% 100%  99%  Weight:      Height:       No intake or output data in the 24 hours ending 01/15/24 1111 Filed Weights   01/14/24 1312  Weight: 77.9 kg    Examination:  General exam: Frail, chronically ill-appearing, AO x 3 Respiratory system: Few basilar rales Cardiovascular system: S1 & S2 heard, RRR.  Abd: nondistended, soft and nontender.Normal bowel sounds heard. Central nervous system: Alert and oriented. No focal neurological deficits. Extremities: 2 plus  Skin: No rashes Psychiatry:  Mood & affect appropriate.     Data Reviewed:   CBC: Recent Labs  Lab 01/14/24 1327 01/15/24 0315  WBC 3.8* 4.0  NEUTROABS 2.0  --   HGB 9.0* 7.0*  HCT 27.0* 20.3*  MCV 102.7* 98.5  PLT 247 195   Basic Metabolic Panel: Recent Labs  Lab 01/14/24 1327 01/15/24 0315  NA 133* 136  K 4.6 3.5  CL 104 106  CO2 15* 19*  GLUCOSE  61* 59*  BUN 8 10  CREATININE 1.03* 1.00  CALCIUM  8.2* 7.4*  MG  --  1.6*   GFR: Estimated Creatinine Clearance: 53.6 mL/min (by C-G formula based on SCr of 1 mg/dL). Liver Function Tests: Recent Labs  Lab 01/14/24 1327 01/15/24 0315  AST 45* 32  ALT 50* 38  ALKPHOS 95 71  BILITOT 1.2 0.8  PROT 5.7* 4.2*  ALBUMIN 2.9* 2.3*   No results for input(s): LIPASE, AMYLASE in the last 168 hours. No results for input(s): AMMONIA in the last 168 hours. Coagulation Profile: No results for input(s): INR, PROTIME in the last  168 hours. Cardiac Enzymes: No results for input(s): CKTOTAL, CKMB, CKMBINDEX, TROPONINI in the last 168 hours. BNP (last 3 results) Recent Labs    01/14/24 1327  PROBNP 1,157.0*   HbA1C: No results for input(s): HGBA1C in the last 72 hours. CBG: Recent Labs  Lab 01/14/24 2021 01/14/24 2237  GLUCAP 52* 76   Lipid Profile: No results for input(s): CHOL, HDL, LDLCALC, TRIG, CHOLHDL, LDLDIRECT in the last 72 hours. Thyroid  Function Tests: No results for input(s): TSH, T4TOTAL, FREET4, T3FREE, THYROIDAB in the last 72 hours. Anemia Panel: Recent Labs    01/15/24 0752  VITAMINB12 1,001*  FOLATE 13.5  FERRITIN 463*  TIBC NOT CALCULATED  IRON 74  RETICCTPCT 3.2*   Urine analysis:    Component Value Date/Time   COLORURINE AMBER (A) 09/15/2023 2249   APPEARANCEUR HAZY (A) 09/15/2023 2249   LABSPEC 1.027 09/15/2023 2249   PHURINE 6.0 09/15/2023 2249   GLUCOSEU NEGATIVE 09/15/2023 2249   HGBUR MODERATE (A) 09/15/2023 2249   BILIRUBINUR SMALL (A) 09/15/2023 2249   KETONESUR 80 (A) 09/15/2023 2249   PROTEINUR 100 (A) 09/15/2023 2249   NITRITE NEGATIVE 09/15/2023 2249   LEUKOCYTESUR TRACE (A) 09/15/2023 2249   Sepsis Labs: @LABRCNTIP (procalcitonin:4,lacticidven:4)  )No results found for this or any previous visit (from the past 240 hours).   Radiology Studies: DG Chest 2 View Result Date: 01/14/2024 CLINICAL DATA:  Shortness of breath. EXAM: CHEST - 2 VIEW COMPARISON:  CTA chest dated 11/13/2023. Chest radiograph dated 09/16/2023. FINDINGS: The heart size and mediastinal contours are unchanged. Aortic atherosclerosis. No overt pulmonary edema. No focal consolidation, pleural effusion, or pneumothorax. Degenerative changes of the thoracic spine and bilateral glenohumeral joints. IMPRESSION: No acute cardiopulmonary findings. Electronically Signed   By: Harrietta Sherry M.D.   On: 01/14/2024 14:19     Scheduled Meds:  apixaban   5 mg Oral BID    cilostazol   50 mg Oral BID   furosemide   40 mg Intravenous BID   pantoprazole   40 mg Oral Daily   potassium chloride  SA  20 mEq Oral Daily   pravastatin   40 mg Oral QPM   Continuous Infusions:   LOS: 1 day    Time spent:    Sigurd Pac, MD [facility]   01/15/2024, 11:11 AM    "

## 2024-01-15 NOTE — Inpatient Diabetes Management (Signed)
 Inpatient Diabetes Program Recommendations  AACE/ADA: New Consensus Statement on Inpatient Glycemic Control (2015)  Target Ranges:  Prepandial:   less than 140 mg/dL      Peak postprandial:   less than 180 mg/dL (1-2 hours)      Critically ill patients:  140 - 180 mg/dL    Latest Reference Range & Units 01/14/24 20:21 01/14/24 22:37  Glucose-Capillary 70 - 99 mg/dL 52 (L) 76    Latest Reference Range & Units 01/14/24 13:27 01/15/24 03:15  Glucose 70 - 99 mg/dL 61 (L) 59 (L)    Review of Glycemic Control  Diabetes history: DM2 Outpatient Diabetes medications: None Current orders for Inpatient glycemic control: None  Inpatient Diabetes Program Recommendations:    Hypoglycemia:  Lab glucose 59 mg/dl today.  If patient is not taking in adequate PO intake, may need to add IV fluids with Dextrose .  CBGs: May want to consider ordering CBGs AC&HS.  Thanks, Earnie Gainer, RN, MSN, CDCES Diabetes Coordinator Inpatient Diabetes Program [phone] (Team Pager from 8am to 5pm)

## 2024-01-15 NOTE — ED Notes (Signed)
 Pt given juice and crackers.

## 2024-01-15 NOTE — ED Notes (Signed)
 Pt linen, gown, and peri-care provided.

## 2024-01-15 NOTE — Progress Notes (Signed)
 Echocardiogram 2D Echocardiogram has been performed.  Angie Carr 01/15/2024, 10:54 AM

## 2024-01-15 NOTE — Consult Note (Addendum)
 "  Referring Provider: Dr. Fairy, [facility] Primary Care Physician:  Benjamine Aland, MD Primary Gastroenterologist:  [facility]  Reason for Consultation: Dysphagia, anemia  HPI: Angie Carr is a 69 y.o. female with PMH of dysphagia with esophageal stenosis and dysmotility with recent dilation, history of PE on Eliquis , type 2 diabetes mellitus, hypertension, DJD, PAD on Pletal , and chronic anemia who presented to [facility] ED with shortness of breath and leg swelling over the last few weeks.  Patient reported that she had EGD with esophageal dilation in November as below, felt better for a few days and then shortly thereafter started having difficulty swallowing again, reports very poor p.o. intake and failure to thrive as a result.  Few weeks ago developed leg swelling and some shortness of breath.   GI consulted for dysphagia.  The patient says that it is not that stuff not really going down.  She says that it is more just feeling full quickly.  Says she he will eat a few spoonfuls of mashed potatoes and then she does not want anymore.  Things do not sound good or taste good to her.  Does not really feel like they are getting stuck.  Not really having regurgitation or vomiting of any material.  No abdominal pain.  Says that her stools are dark brown, but not black per se and no red blood noted.  Says that she had a colonoscopy within the past couple months at [facility].  Has anemia, which appears to be acute on chronic.  MCV is normal.  From iron studies performed a few months ago it appears that this is likely an anemia of chronic disease.  Hemoglobin is 7 g today down from 9 g yesterday and 9-1/2 to 10 g one to two months ago.    Appears to be in afib here in the ED.  ECHO earlier today looked good.  Esophagram  - Benign- appearing esophageal stenosis. Dilated with a 20 mm TTS balloon then fractured with forceps. - Normal upper third of esophagus and middle third of  esophagus. Biopsied. - LA Grade A reflux esophagitis with no bleeding. - 2 cm hiatal hernia. - Gastroesophageal flap valve classified as Hill Grade III ( minimal fold, loose to endoscope, hiatal hernia likely) . - Normal stomach. - Normal examined duodenum.  Esophagram September 2025: IMPRESSION: Limited study.   Nonspecific esophageal dysmotility disorder, with disruption of primary peristaltic waves in the distal esophagus on multiple swallows.  Past Medical History:  Diagnosis Date   Anemia    Anxiety    Asthma    pt denies   Diabetes mellitus without complication (HCC)    DJD (degenerative joint disease)    lower back   GERD (gastroesophageal reflux disease)    Headache    miagraines, none in years   Hypertension    LBP (low back pain)    Morbid obesity (HCC)    Neuromuscular disorder (HCC)    feet tingle and her balance is sometimes a problem   Primary osteoarthritis of both knees    Smoker     Past Surgical History:  Procedure Laterality Date   ABDOMINAL HYSTERECTOMY     complete   CHOLECYSTECTOMY     laparascopic   COLONOSCOPY N/A 02/11/2014   Procedure: COLONOSCOPY;  Surgeon: Lamar Donnald GAILS, MD;  Location: [facility] ENDOSCOPY;  Service: Endoscopy;  Laterality: N/A;   ESOPHAGOGASTRODUODENOSCOPY N/A 11/20/2023   Procedure: EGD (ESOPHAGOGASTRODUODENOSCOPY);  Surgeon: San Sandor GAILS, DO;  Location:  [facility] ENDOSCOPY;  Service: Gastroenterology;  Laterality: N/A;  with possible dilation   HERNIA REPAIR     ventral on left side.   TOTAL HIP ARTHROPLASTY Left 07/24/2023   Procedure: ARTHROPLASTY, HIP, TOTAL,POSTERIOR APPROACH;  Surgeon: Edna Toribio LABOR, MD;  Location: [facility] ORS;  Service: Orthopedics;  Laterality: Left;   TUBAL LIGATION      Prior to Admission medications  Medication Sig Start Date End Date Taking? Authorizing Provider  apixaban  (ELIQUIS ) 5 MG TABS tablet Take 5 mg by mouth 2 (two) times daily.   Yes [provider]  cilostazol  (PLETAL ) 50 MG  tablet Take 50 mg by mouth 2 (two) times daily.   Yes [provider]  omeprazole  (PRILOSEC) 20 MG capsule Take 20 mg by mouth daily. 11/07/23  Yes [provider]  Potassium Chloride  ER 20 MEQ TBCR Take 1 tablet by mouth daily.   Yes [provider]  pravastatin  (PRAVACHOL ) 40 MG tablet Take 40 mg by mouth daily in the afternoon.   Yes [provider]  fesoterodine  (TOVIAZ ) 4 MG TB24 tablet Take 4 mg by mouth daily.    [provider]    Current Facility-Administered Medications  Medication Dose Route Frequency Provider Last Rate Last Admin   acetaminophen  (TYLENOL ) tablet 650 mg  650 mg Oral Q6H PRN Amponsah, Prosper M, MD       Or   acetaminophen  (TYLENOL ) suppository 650 mg  650 mg Rectal Q6H PRN Lou Claretta HERO, MD       apixaban  (ELIQUIS ) tablet 5 mg  5 mg Oral BID Amponsah, Prosper M, MD   5 mg at 01/15/24 0900   bisacodyl  (DULCOLAX) EC tablet 5 mg  5 mg Oral Daily PRN Amponsah, Prosper M, MD       cilostazol  (PLETAL ) tablet 50 mg  50 mg Oral BID Amponsah, Prosper M, MD   50 mg at 01/15/24 1120   dextrose  50 % solution 25 mL  25 mL Intravenous Once Fairy Frames, MD       furosemide  (LASIX ) injection 40 mg  40 mg Intravenous BID Joseph, Preetha, MD   40 mg at 01/15/24 0850   ondansetron  (ZOFRAN ) tablet 4 mg  4 mg Oral Q6H PRN Amponsah, Prosper M, MD       Or   ondansetron  (ZOFRAN ) injection 4 mg  4 mg Intravenous Q6H PRN Amponsah, Prosper M, MD       pantoprazole  (PROTONIX ) EC tablet 40 mg  40 mg Oral Daily Amponsah, Prosper M, MD   40 mg at 01/15/24 0900   potassium chloride  SA (KLOR-CON  M) CR tablet 20 mEq  20 mEq Oral Daily Amponsah, Prosper M, MD   20 mEq at 01/15/24 0900   pravastatin  (PRAVACHOL ) tablet 40 mg  40 mg Oral QPM Lou Claretta HERO, MD       senna-docusate (Senokot-S) tablet 1 tablet  1 tablet Oral QHS PRN Lou Claretta HERO, MD       Current Outpatient Medications  Medication Sig Dispense Refill   apixaban   (ELIQUIS ) 5 MG TABS tablet Take 5 mg by mouth 2 (two) times daily.     cilostazol  (PLETAL ) 50 MG tablet Take 50 mg by mouth 2 (two) times daily.     omeprazole  (PRILOSEC) 20 MG capsule Take 20 mg by mouth daily.     Potassium Chloride  ER 20 MEQ TBCR Take 1 tablet by mouth daily.     pravastatin  (PRAVACHOL ) 40 MG tablet Take 40 mg by mouth daily in the  afternoon.     fesoterodine  (TOVIAZ ) 4 MG TB24 tablet Take 4 mg by mouth daily.      Allergies as of 01/14/2024   (No Known Allergies)    Family History  Problem Relation Age of Onset   Hypertension Other    Stroke Other     Social History   Socioeconomic History   Marital status: Divorced    Spouse name: Not on file   Number of children: Not on file   Years of education: Not on file   Highest education level: Not on file  Occupational History   Not on file  Tobacco Use   Smoking status: Some Days    Current packs/day: 0.50    Types: Cigarettes   Smokeless tobacco: Never  Vaping Use   Vaping status: Never Used  Substance and Sexual Activity   Alcohol  use: No   Drug use: Yes    Types: Marijuana   Sexual activity: Not Currently  Other Topics Concern   Not on file  Social History Narrative   Not on file   Social Drivers of Health   Tobacco Use: High Risk (01/14/2024)   Patient History    Smoking Tobacco Use: Some Days    Smokeless Tobacco Use: Never    Passive Exposure: Not on file  Financial Resource Strain: Low Risk (11/04/2023)   Overall Financial Resource Strain (CARDIA)    Difficulty of Paying Living Expenses: Not very hard  Food Insecurity: No Food Insecurity (01/15/2024)   Epic    Worried About Programme Researcher, Broadcasting/film/video in the Last Year: Never true    Ran Out of Food in the Last Year: Never true  Transportation Needs: No Transportation Needs (01/15/2024)   Epic    Lack of Transportation (Medical): No    Lack of Transportation (Non-Medical): No  Physical Activity: Inactive (11/04/2023)   Exercise Vital Sign     Days of Exercise per Week: 0 days    Minutes of Exercise per Session: 0 min  Stress: No Stress Concern Present (11/04/2023)   [employer] of Occupational Health - Occupational Stress Questionnaire    Feeling of Stress: Only a little  Social Connections: Moderately Integrated (01/15/2024)   Social Connection and Isolation Panel    Frequency of Communication with Friends and Family: More than three times a week    Frequency of Social Gatherings with Friends and Family: More than three times a week    Attends Religious Services: More than 4 times per year    Active Member of [employer] or Organizations: Yes    Attends Banker Meetings: More than 4 times per year    Marital Status: Divorced  Intimate Partner Violence: Not At Risk (01/15/2024)   Epic    Fear of Current or Ex-Partner: No    Emotionally Abused: No    Physically Abused: No    Sexually Abused: No  Depression (PHQ2-9): Low Risk (11/26/2023)   Depression (PHQ2-9)    PHQ-2 Score: 0  Alcohol  Screen: Not on file  Housing: Low Risk (01/15/2024)   Epic    Unable to Pay for Housing in the Last Year: No    Number of Times Moved in the Last Year: 0    Homeless in the Last Year: No  Utilities: Not At Risk (01/15/2024)   Epic    Threatened with loss of utilities: No  Health Literacy: Adequate Health Literacy (11/04/2023)   B1300 Health Literacy    Frequency of need for help with  medical instructions: Never    Review of Systems: ROS otherwise negative except as mentioned in HPI.  Physical Exam: Vital signs in last 24 hours: Temp:  [97.2 F (36.2 C)-98.2 F (36.8 C)] 97.9 F (36.6 C) (01/14 1100) Pulse Rate:  [87-108] 95 (01/14 1300) Resp:  [10-26] 21 (01/14 1300) BP: (93-126)/(58-84) 111/79 (01/14 1300) SpO2:  [99 %-100 %] 100 % (01/14 1300)   General:  Alert, Well-developed, well-nourished, pleasant and cooperative in NAD Head:  Normocephalic and atraumatic. Eyes:  Sclera clear, no icterus.  Conjunctiva  pink. Ears:  Normal auditory acuity. Mouth:  No deformity or lesions.  Poor dentition.  Lungs:  Clear throughout to auscultation.  No wheezes, crackles, or rhonchi.  Heart:  Atrial fibrillation Abdomen:  Soft, non-distended.  BS present.  Non-tender. Msk:  Symmetrical without gross deformities. Pulses:  Normal pulses noted. Extremities:  Edema in B/L LEs Neurologic:  Alert and oriented x 4;  grossly normal neurologically. Skin:  Intact without significant lesions or rashes. Psych:  Alert and cooperative. Normal mood and affect.  Lab Results: Recent Labs    01/14/24 1327 01/15/24 0315  WBC 3.8* 4.0  HGB 9.0* 7.0*  HCT 27.0* 20.3*  PLT 247 195   BMET Recent Labs    01/14/24 1327 01/15/24 0315  NA 133* 136  K 4.6 3.5  CL 104 106  CO2 15* 19*  GLUCOSE 61* 59*  BUN 8 10  CREATININE 1.03* 1.00  CALCIUM  8.2* 7.4*   LFT Recent Labs    01/15/24 0315  PROT 4.2*  ALBUMIN 2.3*  AST 32  ALT 38  ALKPHOS 71  BILITOT 0.8   Studies/Results: ECHOCARDIOGRAM COMPLETE Result Date: 01/15/2024    ECHOCARDIOGRAM REPORT   Patient Name:   Angie Carr Date of Exam: 01/15/2024 Medical Rec #:  [id number]          Height:       64.0 in Accession #:    [id number]         Weight:       171.7 lb Date of Birth:  1955-01-17          BSA:          1.834 m Patient Age:    68 years           BP:           111/75 mmHg Patient Gender: F                  HR:           93 bpm. Exam Location:  Inpatient Procedure: 2D Echo, Cardiac Doppler and Color Doppler (Both Spectral and Color            Flow Doppler were utilized during procedure). Indications:    Dyspnea  History:        Patient has prior history of Echocardiogram examinations, most                 recent 08/30/2023. CHF, Signs/Symptoms:Dyspnea, Hypotension and                 Dizziness/Lightheadedness; Risk Factors:Dyslipidemia, Diabetes,                 Hypertension and Current Smoker.  Sonographer:    Juliene Rucks Referring Phys: [id number] PROSPER  M AMPONSAH  Sonographer Comments: Technically difficult study due to poor echo windows, suboptimal apical window, suboptimal parasternal window, suboptimal subcostal window and patient is obese. IMPRESSIONS  1.  Left ventricular ejection fraction, by estimation, is 60 to 65%. The left ventricle has normal function. The left ventricle has no regional wall motion abnormalities. There is mild concentric left ventricular hypertrophy. Left ventricular diastolic parameters are consistent with Grade I diastolic dysfunction (impaired relaxation).  2. Right ventricular systolic function is normal. The right ventricular size is normal.  3. A small pericardial effusion is present.  4. The mitral valve is normal in structure. No evidence of mitral valve regurgitation. No evidence of mitral stenosis.  5. The aortic valve has an indeterminant number of cusps. Aortic valve regurgitation is trivial. No aortic stenosis is present.  6. The inferior vena cava is normal in size with greater than 50% respiratory variability, suggesting right atrial pressure of 3 mmHg. FINDINGS  Left Ventricle: Left ventricular ejection fraction, by estimation, is 60 to 65%. The left ventricle has normal function. The left ventricle has no regional wall motion abnormalities. The left ventricular internal cavity size was normal in size. There is  mild concentric left ventricular hypertrophy. Left ventricular diastolic parameters are consistent with Grade I diastolic dysfunction (impaired relaxation). Right Ventricle: The right ventricular size is normal. Right ventricular systolic function is normal. Left Atrium: Left atrial size was normal in size. Right Atrium: Right atrial size was normal in size. Pericardium: A small pericardial effusion is present. Mitral Valve: The mitral valve is normal in structure. Mild mitral annular calcification. No evidence of mitral valve regurgitation. No evidence of mitral valve stenosis. Tricuspid Valve: The tricuspid  valve is normal in structure. Tricuspid valve regurgitation is trivial. No evidence of tricuspid stenosis. Aortic Valve: The aortic valve has an indeterminant number of cusps. Aortic valve regurgitation is trivial. No aortic stenosis is present. Pulmonic Valve: The pulmonic valve was not well visualized. Pulmonic valve regurgitation is not visualized. No evidence of pulmonic stenosis. Aorta: The aortic root is normal in size and structure. Venous: The inferior vena cava is normal in size with greater than 50% respiratory variability, suggesting right atrial pressure of 3 mmHg. IAS/Shunts: The interatrial septum was not well visualized.  LEFT VENTRICLE PLAX 2D LVIDd:         4.60 cm   Diastology LVIDs:         3.30 cm   LV e' medial:    7.51 cm/s LV PW:         1.30 cm   LV E/e' medial:  12.0 LV IVS:        1.10 cm   LV e' lateral:   7.94 cm/s LVOT diam:     1.70 cm   LV E/e' lateral: 11.3 LV SV:         41 LV SV Index:   22 LVOT Area:     2.27 cm  RIGHT VENTRICLE TAPSE (M-mode): 1.9 cm LEFT ATRIUM         Index LA diam:    2.30 cm 1.25 cm/m  AORTIC VALVE LVOT Vmax:   121.00 cm/s LVOT Vmean:  82.300 cm/s LVOT VTI:    0.179 m  AORTA Ao Root diam: 3.60 cm MITRAL VALVE MV Area (PHT): 4.68 cm     SHUNTS MV Decel Time: 162 msec     Systemic VTI:  0.18 m MV E velocity: 90.10 cm/s   Systemic Diam: 1.70 cm MV A velocity: 137.00 cm/s MV E/A ratio:  0.66 Redell Shallow MD Electronically signed by Redell Shallow MD Signature Date/Time: 01/15/2024/1:19:43 PM    Final    DG Chest 2 View  Result Date: 01/14/2024 CLINICAL DATA:  Shortness of breath. EXAM: CHEST - 2 VIEW COMPARISON:  CTA chest dated 11/13/2023. Chest radiograph dated 09/16/2023. FINDINGS: The heart size and mediastinal contours are unchanged. Aortic atherosclerosis. No overt pulmonary edema. No focal consolidation, pleural effusion, or pneumothorax. Degenerative changes of the thoracic spine and bilateral glenohumeral joints. IMPRESSION: No acute cardiopulmonary  findings. Electronically Signed   By: Harrietta Sherry M.D.   On: 01/14/2024 14:19    IMPRESSION:  Dysphagia, esophagram performed September 2025 and showed nonspecific esophageal dysmotility disorder with disruption of primary peristaltic waves in the distal esophagus.  Then underwent EGD in November 2025 with a benign-appearing esophageal stenosis noted that was dilated to 20 mm with balloon dilation.  Minimal and very short-lived improvement.  She does not really describe to me significant dysphagia, more poor appetite and early satiety.  Anemia, appears that this is acute on chronic.  MCV is normal.  From iron studies performed a few months ago it appears that this is likely an anemia of chronic disease.  Hemoglobin is 7 g today down from 9 g yesterday and 9-1/2 to 10 g one to two months ago.  Says that she just had a colonoscopy at [facility] a couple of months ago with just a couple of polyps removed.  No overt GI bleeding.  Acute on chronic diastolic heart failure, which is the primary admitting diagnosis, but ECHO today looks good  History of PE, on Eliquis  with last dose this morning.  Looks like she is having some A-fib here in the ED.  PAD on Pletal   PLAN: - Will discuss with Dr. Charlanne, but may want to consider just repeating esophagram.  She may need some other cardiac workup with her swelling and now it appears to be new A-fib, but ECHO today looked good.  She also took her Eliquis  this morning so would need washout from that before any procedure. - Trend hemoglobin and observe for any bleeding. - Ultimately will need outpatient follow-up with [facility], which is her primary GI.   Harlene BIRCH. Zehr  01/15/2024, 1:42 PM    Attending physician's note   I have taken history, reviewed the chart and examined the patient. I performed a substantive portion of this encounter, including complete performance of at least one of the key components, in conjunction with the  APP. I agree with the Advanced Practitioner's note, impression and recommendations.   GI consulted for Eso dysphagia- much better since EGD with dil 20mm.  Patient is edentulous GERD with small HH Adm for  acute on chronic diastolic heart failure Acute on chronic anemia.  No evidence of GI bleeding.  Negative colonoscopy at [facility] 2 months ago. H/O PE on Eliquis  (last dose 1/14), PAD on Pletal   Plan: - Barium swallow with Ba tab as well in AM. - Continue Protonix  40 mg p.o. daily - Trend CBC. Keep Hb>7.  No overt bleeding. - Not keen on performing any endo procedures this adm unless overt bleeding or abn Ba swallow. - FU [facility] as outpt    Anselm Charlanne, MD [facility] [phone]    "

## 2024-01-15 NOTE — ED Notes (Signed)
 RN assisted pt on bedside to eat breakfast.

## 2024-01-15 NOTE — ED Notes (Signed)
 Dr. Sigurd notified of patients hypoglycemia.

## 2024-01-16 ENCOUNTER — Inpatient Hospital Stay (HOSPITAL_COMMUNITY)

## 2024-01-16 ENCOUNTER — Telehealth: Payer: Self-pay

## 2024-01-16 DIAGNOSIS — D509 Iron deficiency anemia, unspecified: Secondary | ICD-10-CM | POA: Diagnosis not present

## 2024-01-16 DIAGNOSIS — K625 Hemorrhage of anus and rectum: Secondary | ICD-10-CM

## 2024-01-16 DIAGNOSIS — I5033 Acute on chronic diastolic (congestive) heart failure: Secondary | ICD-10-CM | POA: Diagnosis not present

## 2024-01-16 DIAGNOSIS — Z7901 Long term (current) use of anticoagulants: Secondary | ICD-10-CM | POA: Diagnosis not present

## 2024-01-16 DIAGNOSIS — K224 Dyskinesia of esophagus: Secondary | ICD-10-CM

## 2024-01-16 LAB — TYPE AND SCREEN
ABO/RH(D): O POS
Antibody Screen: NEGATIVE
Unit division: 0

## 2024-01-16 LAB — BASIC METABOLIC PANEL WITH GFR
Anion gap: 12 (ref 5–15)
BUN: 8 mg/dL (ref 8–23)
CO2: 20 mmol/L — ABNORMAL LOW (ref 22–32)
Calcium: 7.5 mg/dL — ABNORMAL LOW (ref 8.9–10.3)
Chloride: 103 mmol/L (ref 98–111)
Creatinine, Ser: 0.99 mg/dL (ref 0.44–1.00)
GFR, Estimated: >60 mL/min
Glucose, Bld: 75 mg/dL (ref 70–99)
Potassium: 3.1 mmol/L — ABNORMAL LOW (ref 3.5–5.1)
Sodium: 135 mmol/L (ref 135–145)

## 2024-01-16 LAB — BPAM RBC
Blood Product Expiration Date: 202602022359
ISSUE DATE / TIME: 202601141035
Unit Type and Rh: 5100

## 2024-01-16 LAB — GLUCOSE, CAPILLARY
Glucose-Capillary: 83 mg/dL (ref 70–99)
Glucose-Capillary: 75 mg/dL (ref 70–99)
Glucose-Capillary: 96 mg/dL (ref 70–99)
Glucose-Capillary: 84 mg/dL (ref 70–99)

## 2024-01-16 LAB — CBC
HCT: 27.4 % — ABNORMAL LOW (ref 36.0–46.0)
Hemoglobin: 10 g/dL — ABNORMAL LOW (ref 12.0–15.0)
MCH: 33.7 pg (ref 26.0–34.0)
MCHC: 36.5 g/dL — ABNORMAL HIGH (ref 30.0–36.0)
MCV: 92.3 fL (ref 80.0–100.0)
Platelets: 216 K/uL (ref 150–400)
RBC: 2.97 MIL/uL — ABNORMAL LOW (ref 3.87–5.11)
RDW: 21.2 % — ABNORMAL HIGH (ref 11.5–15.5)
WBC: 4.8 K/uL (ref 4.0–10.5)
nRBC: 0 % (ref 0.0–0.2)

## 2024-01-16 LAB — MRSA NEXT GEN BY PCR, NASAL: MRSA by PCR Next Gen: DETECTED — AB

## 2024-01-16 MED ORDER — MUPIROCIN 2 % EX OINT
1.0000 | TOPICAL_OINTMENT | Freq: Two times a day (BID) | CUTANEOUS | Status: DC
  Administered 2024-01-16 – 2024-01-18 (×5): 1 via NASAL
  Filled 2024-01-16 (×2): qty 22

## 2024-01-16 MED ORDER — CHLORHEXIDINE GLUCONATE CLOTH 2 % EX PADS
6.0000 | MEDICATED_PAD | Freq: Every day | CUTANEOUS | Status: DC
  Administered 2024-01-17 – 2024-01-18 (×2): 6 via TOPICAL

## 2024-01-16 MED ORDER — METOPROLOL TARTRATE 12.5 MG HALF TABLET
12.5000 mg | ORAL_TABLET | Freq: Two times a day (BID) | ORAL | Status: DC
  Administered 2024-01-16 – 2024-01-18 (×5): 12.5 mg via ORAL
  Filled 2024-01-16 (×5): qty 1

## 2024-01-16 MED ORDER — POTASSIUM CHLORIDE CRYS ER 20 MEQ PO TBCR
40.0000 meq | EXTENDED_RELEASE_TABLET | Freq: Two times a day (BID) | ORAL | Status: AC
  Administered 2024-01-16 (×2): 40 meq via ORAL
  Filled 2024-01-16 (×2): qty 2

## 2024-01-16 NOTE — Evaluation (Signed)
 Occupational Therapy Evaluation Patient Details Name: Angie Carr MRN: [id number] DOB: 05-18-1955 Today's Date: 01/16/2024   History of Present Illness   Pt is a 69 y.o female admitted 1/13 for SOB and BLE edema.  New dx of CHF. PMHx: DM2 , HTN, PVD, PE , anxiety, asthma,  HTN, neuromuscular disorder, COVID, DJD, esophageal stenosis     Clinical Impressions Pt admitted based on above, and was seen based on problem list below. PTA pt was living with family and reports mod I with ADLs. Today pt is requiring set up  to min  assist for ADLs. Functional transfers are  CGA with RW. Pt limited to side steps d/t increased HR, max 148 bpm, RN and MD notified. Anticipate as rate becomes controlled, pt will continue to progress well, recommending HHOT. OT will continue to follow acutely to maximize functional independence.        If plan is discharge home, recommend the following:   A little help with walking and/or transfers;A little help with bathing/dressing/bathroom;Assistance with cooking/housework;Help with stairs or ramp for entrance     Functional Status Assessment   Patient has had a recent decline in their functional status and demonstrates the ability to make significant improvements in function in a reasonable and predictable amount of time.     Equipment Recommendations   None recommended by OT     Recommendations for Other Services         Precautions/Restrictions   Precautions Precautions: Fall Recall of Precautions/Restrictions: Intact Precaution/Restrictions Comments: Watch HR Restrictions Weight Bearing Restrictions Per Provider Order: No     Mobility Bed Mobility Overal bed mobility: Needs Assistance Bed Mobility: Supine to Sit, Sit to Supine     Supine to sit: Supervision Sit to supine: Min assist   General bed mobility comments: S for safety with lines. Min assist to return legs to bed    Transfers Overall transfer level: Needs  assistance Equipment used: Rolling walker (2 wheels) Transfers: Sit to/from Stand Sit to Stand: Contact guard assist           General transfer comment: CGA for balance. Cues for hand palcement. Rest break required after inital STS d/t c/o of SOB. After second STS, pt able to take lateral steps along EOB      Balance Overall balance assessment: Needs assistance Sitting-balance support: Bilateral upper extremity supported, Feet supported Sitting balance-Leahy Scale: Fair     Standing balance support: Bilateral upper extremity supported, During functional activity, Reliant on assistive device for balance Standing balance-Leahy Scale: Poor Standing balance comment: Reliant on RW                           ADL either performed or assessed with clinical judgement   ADL Overall ADL's : Needs assistance/impaired Eating/Feeding: Set up;Sitting   Grooming: Set up;Sitting   Upper Body Bathing: Set up;Sitting   Lower Body Bathing: Minimal assistance;Sit to/from stand   Upper Body Dressing : Set up;Sitting   Lower Body Dressing: Minimal assistance;Sit to/from stand   Toilet Transfer: Contact guard assist;Rolling walker (2 wheels) Toilet Transfer Details (indicate cue type and reason): Lateral steps along EOB         Functional mobility during ADLs: Contact guard assist;Rolling walker (2 wheels) General ADL Comments: Limited by decreased activity tolerance and HR     Vision Baseline Vision/History: 1 Wears glasses Vision Assessment?: No apparent visual deficits  Pertinent Vitals/Pain Pain Assessment Pain Assessment: No/denies pain     Extremity/Trunk Assessment Upper Extremity Assessment Upper Extremity Assessment: Generalized weakness   Lower Extremity Assessment Lower Extremity Assessment: Defer to PT evaluation   Cervical / Trunk Assessment Cervical / Trunk Assessment: Kyphotic   Communication Communication Communication: No apparent  difficulties   Cognition Arousal: Alert Behavior During Therapy: WFL for tasks assessed/performed Cognition: No apparent impairments       Following commands: Intact       Cueing  General Comments   Cueing Techniques: Verbal cues  Tachycardic with activity. Max HR for brief stand 148 bpm           Home Living Family/patient expects to be discharged to:: Private residence Living Arrangements: Children;Other relatives (son who is w/c bound and grandson) Available Help at Discharge: Family;Available 24 hours/day (Son has HHRN) Type of Home: House Home Access: Ramped entrance     Home Layout: One level     Bathroom Shower/Tub: Producer, Television/film/video: Handicapped height Bathroom Accessibility: Yes How Accessible: Accessible via wheelchair Home Equipment: Cane - single point;Rollator (4 wheels);Toilet riser;Shower seat;BSC/3in1   Additional Comments: son is ind at [facility] level, he cannot provide physical assist      Prior Functioning/Environment Prior Level of Function : Independent/Modified Independent       Mobility Comments: Mod I with rollator ADLs Comments: Ind, assist for transportation    OT Problem List: Decreased strength;Decreased activity tolerance;Impaired balance (sitting and/or standing);Decreased safety awareness;Decreased knowledge of use of DME or AE;Cardiopulmonary status limiting activity   OT Treatment/Interventions: Self-care/ADL training;Therapeutic exercise;DME and/or AE instruction;Therapeutic activities;Patient/family education;Balance training      OT Goals(Current goals can be found in the care plan section)   Acute Rehab OT Goals Patient Stated Goal: To get better OT Goal Formulation: With patient Time For Goal Achievement: 01/30/24 Potential to Achieve Goals: Good   OT Frequency:  Min 2X/week       AM-PAC OT 6 Clicks Daily Activity     Outcome Measure Help from another person eating meals?: None Help from another  person taking care of personal grooming?: A Little Help from another person toileting, which includes using toliet, bedpan, or urinal?: A Little Help from another person bathing (including washing, rinsing, drying)?: A Little Help from another person to put on and taking off regular upper body clothing?: A Little Help from another person to put on and taking off regular lower body clothing?: A Little 6 Click Score: 19   End of Session Equipment Utilized During Treatment: Gait belt;Rolling walker (2 wheels) Nurse Communication: Mobility status  Activity Tolerance: Patient limited by fatigue Patient left: in bed;with call bell/phone within reach;with bed alarm set  OT Visit Diagnosis: Unsteadiness on feet (R26.81);Other abnormalities of gait and mobility (R26.89);Muscle weakness (generalized) (M62.81)                Time: 8853-8789 OT Time Calculation (min): 24 min Charges:  OT General Charges $OT Visit: 1 Visit OT Evaluation $OT Eval Moderate Complexity: 1 Mod  Adrianne BROCKS, OT  Acute Rehabilitation Services Office [phone] Secure chat preferred   Adrianne GORMAN Savers 01/16/2024, 12:39 PM

## 2024-01-16 NOTE — Plan of Care (Signed)
   Problem: Nutrition: Goal: Adequate nutrition will be maintained Outcome: Progressing   Problem: Pain Managment: Goal: General experience of comfort will improve and/or be controlled Outcome: Progressing   Problem: Safety: Goal: Ability to remain free from injury will improve Outcome: Progressing

## 2024-01-16 NOTE — Progress Notes (Addendum)
 " PROGRESS NOTE    Angie Carr  FMW:[id number] DOB: [birth year] DOA: 01/14/2024 PCP: Benjamine Aland, MD  68/F w esophageal stenosis, recent dilation, history of PE on Eliquis , type 2 diabetes mellitus, hypertension, DJD, chronic anemia presented to the ED with shortness of breath and leg swelling over the last few weeks. -Patient reports having her esophagus stretched in November, felt better for a few days and then shortly thereafter started having difficulty swallowing again, reports very poor p.o. intake and failure to thrive as a result.  Few weeks ago developed leg swelling and some shortness of breath.  In the ED mildly tachycardic, labs sodium 1333, creatinine 1.03, hemoglobin 9, proBNP 1157, troponin 33, 28 chest x-ray with no active disease. - Repeat hemoglobin dropped to 7  Subjective: -Feels weak, ongoing issues with dysphagia, oral intake has been very poor  Assessment and Plan:  Acute on chronic diastolic heart failure - Last TTE on 08/2023 shows EF 55-60%, Moderate LVH, and G1DD  -Likely exacerbated by anemia - Improving with diuresis, 2 L negative, continue Lasix  80 mg twice daily, Jardiance -Mild sinus tachycardia add low-dose Toprol , check TSH in a.m. - BMP in a.m.  Acute on chronic anemia -Hemoglobin lower than baseline, normocytic, transfused 1 unit PRBC yesterday for hemoglobin of 7 - Anemia panel not calculated, ferritin was elevated, serum iron was normal - Reports having a colonoscopy in the last few months which was allegedly unremarkable, through [facility] medical - On Eliquis  with history of PE  Esophageal stenosis - Noted on endoscopy 11/20/2023, sp dilation - Biopsy done noted candidiasis, negative for malignancy - She is unclear if treated with antifungals - History of intermittent worsening dysphagia for over a month, GI following, plan for esophagram this admission  T2DM - Well-controlled, last A1c 4.8% 5 months ago - SSI with meals, CBG monitoring    # Hx of pulmonary embolism - Continue Eliquis    # AAA - CTA chest in Nov 2025 showed a 4.1 cm ascending aortic aneurysm - Needs annual imaging follow-up by CTA or MRA   # PAD - Continue cilostazol    # HLD -Continue pravastatin    # GERD - Continue PPI     DVT prophylaxis:eliquis  Code Status: Full Code Family Communication: none present Disposition Plan: Home pending above workup  Consultants: GI   Procedures:   Antimicrobials:    Objective: Vitals:   01/16/24 0300 01/16/24 0700 01/16/24 1106 01/16/24 1130  BP: 117/77 110/72 105/72   Pulse: 85 98    Resp: 18 20 13 18   Temp: 97.9 F (36.6 C) 98 F (36.7 C) 98 F (36.7 C)   TempSrc: Oral Oral Oral   SpO2: 99% 100% 98%   Weight: 67.6 kg     Height:        Intake/Output Summary (Last 24 hours) at 01/16/2024 1201 Last data filed at 01/16/2024 0400 Gross per 24 hour  Intake 240 ml  Output 2200 ml  Net -1960 ml   Filed Weights   01/14/24 1312 01/16/24 0300  Weight: 77.9 kg 67.6 kg    Examination:  General exam: Frail, chronically ill-appearing, AO x 3 Respiratory system: Few basilar rales Cardiovascular system: S1 & S2 heard, RRR.  Abd: nondistended, soft and nontender.Normal bowel sounds heard. Central nervous system: Alert and oriented. No focal neurological deficits. Extremities: Plus edema Skin: No rashes Psychiatry:  Mood & affect appropriate.     Data Reviewed:   CBC: Recent Labs  Lab 01/14/24 1327 01/15/24 0315 01/15/24 1539 01/16/24  0236  WBC 3.8* 4.0  --  4.8  NEUTROABS 2.0  --   --   --   HGB 9.0* 7.0* 9.1* 10.0*  HCT 27.0* 20.3* 25.9* 27.4*  MCV 102.7* 98.5  --  92.3  PLT 247 195  --  216   Basic Metabolic Panel: Recent Labs  Lab 01/14/24 1327 01/15/24 0315 01/16/24 0236  NA 133* 136 135  K 4.6 3.5 3.1*  CL 104 106 103  CO2 15* 19* 20*  GLUCOSE 61* 59* 75  BUN 8 10 8   CREATININE 1.03* 1.00 0.99  CALCIUM  8.2* 7.4* 7.5*  MG  --  1.6*  --    GFR: Estimated  Creatinine Clearance: 50.7 mL/min (by C-G formula based on SCr of 0.99 mg/dL). Liver Function Tests: Recent Labs  Lab 01/14/24 1327 01/15/24 0315  AST 45* 32  ALT 50* 38  ALKPHOS 95 71  BILITOT 1.2 0.8  PROT 5.7* 4.2*  ALBUMIN 2.9* 2.3*   No results for input(s): LIPASE, AMYLASE in the last 168 hours. No results for input(s): AMMONIA in the last 168 hours. Coagulation Profile: No results for input(s): INR, PROTIME in the last 168 hours. Cardiac Enzymes: No results for input(s): CKTOTAL, CKMB, CKMBINDEX, TROPONINI in the last 168 hours. BNP (last 3 results) Recent Labs    01/14/24 1327  PROBNP 1,157.0*   HbA1C: No results for input(s): HGBA1C in the last 72 hours. CBG: Recent Labs  Lab 01/15/24 1418 01/15/24 1636 01/15/24 2150 01/16/24 0716 01/16/24 1105  GLUCAP 127* 119* 70 83 75   Lipid Profile: No results for input(s): CHOL, HDL, LDLCALC, TRIG, CHOLHDL, LDLDIRECT in the last 72 hours. Thyroid  Function Tests: No results for input(s): TSH, T4TOTAL, FREET4, T3FREE, THYROIDAB in the last 72 hours. Anemia Panel: Recent Labs    01/15/24 0752  VITAMINB12 1,001*  FOLATE 13.5  FERRITIN 463*  TIBC NOT CALCULATED  IRON 74  RETICCTPCT 3.2*   Urine analysis:    Component Value Date/Time   COLORURINE AMBER (A) 09/15/2023 2249   APPEARANCEUR HAZY (A) 09/15/2023 2249   LABSPEC 1.027 09/15/2023 2249   PHURINE 6.0 09/15/2023 2249   GLUCOSEU NEGATIVE 09/15/2023 2249   HGBUR MODERATE (A) 09/15/2023 2249   BILIRUBINUR SMALL (A) 09/15/2023 2249   KETONESUR 80 (A) 09/15/2023 2249   PROTEINUR 100 (A) 09/15/2023 2249   NITRITE NEGATIVE 09/15/2023 2249   LEUKOCYTESUR TRACE (A) 09/15/2023 2249   Sepsis Labs: @LABRCNTIP (procalcitonin:4,lacticidven:4)  ) Recent Results (from the past 240 hours)  MRSA Next Gen by PCR, Nasal     Status: Abnormal   Collection Time: 01/15/24 11:40 PM   Specimen: Nasal Mucosa; Nasal Swab  Result  Value Ref Range Status   MRSA by PCR Next Gen DETECTED (A) NOT DETECTED Final    Comment: CRITICAL RESULT CALLED TO, READ BACK BY AND VERIFIED WITH:  MEAGAN 01/16/2024 BY DD @ [phone] (NOTE) The GeneXpert MRSA Assay (FDA approved for NASAL specimens only), is one component of a comprehensive MRSA colonization surveillance program. It is not intended to diagnose MRSA infection nor to guide or monitor treatment for MRSA infections. Test performance is not FDA approved in patients less than 48 years old. Performed at [facility], [address], [place], [place] [address]      Radiology Studies: ECHOCARDIOGRAM COMPLETE Result Date: 01/15/2024    ECHOCARDIOGRAM REPORT   Patient Name:   WILSON SAMPLE Date of Exam: 01/15/2024 Medical Rec #:  [id number]  Height:       64.0 in Accession #:    [id number]         Weight:       171.7 lb Date of Birth:  12-24-1955          BSA:          1.834 m Patient Age:    68 years           BP:           111/75 mmHg Patient Gender: F                  HR:           93 bpm. Exam Location:  Inpatient Procedure: 2D Echo, Cardiac Doppler and Color Doppler (Both Spectral and Color            Flow Doppler were utilized during procedure). Indications:    Dyspnea  History:        Patient has prior history of Echocardiogram examinations, most                 recent 08/30/2023. CHF, Signs/Symptoms:Dyspnea, Hypotension and                 Dizziness/Lightheadedness; Risk Factors:Dyslipidemia, Diabetes,                 Hypertension and Current Smoker.  Sonographer:    Juliene Rucks Referring Phys: [id number] PROSPER M AMPONSAH  Sonographer Comments: Technically difficult study due to poor echo windows, suboptimal apical window, suboptimal parasternal window, suboptimal subcostal window and patient is obese. IMPRESSIONS  1. Left ventricular ejection fraction, by estimation, is 60 to 65%. The left ventricle has normal function. The left ventricle has no regional wall motion  abnormalities. There is mild concentric left ventricular hypertrophy. Left ventricular diastolic parameters are consistent with Grade I diastolic dysfunction (impaired relaxation).  2. Right ventricular systolic function is normal. The right ventricular size is normal.  3. A small pericardial effusion is present.  4. The mitral valve is normal in structure. No evidence of mitral valve regurgitation. No evidence of mitral stenosis.  5. The aortic valve has an indeterminant number of cusps. Aortic valve regurgitation is trivial. No aortic stenosis is present.  6. The inferior vena cava is normal in size with greater than 50% respiratory variability, suggesting right atrial pressure of 3 mmHg. FINDINGS  Left Ventricle: Left ventricular ejection fraction, by estimation, is 60 to 65%. The left ventricle has normal function. The left ventricle has no regional wall motion abnormalities. The left ventricular internal cavity size was normal in size. There is  mild concentric left ventricular hypertrophy. Left ventricular diastolic parameters are consistent with Grade I diastolic dysfunction (impaired relaxation). Right Ventricle: The right ventricular size is normal. Right ventricular systolic function is normal. Left Atrium: Left atrial size was normal in size. Right Atrium: Right atrial size was normal in size. Pericardium: A small pericardial effusion is present. Mitral Valve: The mitral valve is normal in structure. Mild mitral annular calcification. No evidence of mitral valve regurgitation. No evidence of mitral valve stenosis. Tricuspid Valve: The tricuspid valve is normal in structure. Tricuspid valve regurgitation is trivial. No evidence of tricuspid stenosis. Aortic Valve: The aortic valve has an indeterminant number of cusps. Aortic valve regurgitation is trivial. No aortic stenosis is present. Pulmonic Valve: The pulmonic valve was not well visualized. Pulmonic valve regurgitation is not visualized. No evidence  of pulmonic stenosis. Aorta: The aortic root is  normal in size and structure. Venous: The inferior vena cava is normal in size with greater than 50% respiratory variability, suggesting right atrial pressure of 3 mmHg. IAS/Shunts: The interatrial septum was not well visualized.  LEFT VENTRICLE PLAX 2D LVIDd:         4.60 cm   Diastology LVIDs:         3.30 cm   LV e' medial:    7.51 cm/s LV PW:         1.30 cm   LV E/e' medial:  12.0 LV IVS:        1.10 cm   LV e' lateral:   7.94 cm/s LVOT diam:     1.70 cm   LV E/e' lateral: 11.3 LV SV:         41 LV SV Index:   22 LVOT Area:     2.27 cm  RIGHT VENTRICLE TAPSE (M-mode): 1.9 cm LEFT ATRIUM         Index LA diam:    2.30 cm 1.25 cm/m  AORTIC VALVE LVOT Vmax:   121.00 cm/s LVOT Vmean:  82.300 cm/s LVOT VTI:    0.179 m  AORTA Ao Root diam: 3.60 cm MITRAL VALVE MV Area (PHT): 4.68 cm     SHUNTS MV Decel Time: 162 msec     Systemic VTI:  0.18 m MV E velocity: 90.10 cm/s   Systemic Diam: 1.70 cm MV A velocity: 137.00 cm/s MV E/A ratio:  0.66 Redell Shallow MD Electronically signed by Redell Shallow MD Signature Date/Time: 01/15/2024/1:19:43 PM    Final    DG Chest 2 View Result Date: 01/14/2024 CLINICAL DATA:  Shortness of breath. EXAM: CHEST - 2 VIEW COMPARISON:  CTA chest dated 11/13/2023. Chest radiograph dated 09/16/2023. FINDINGS: The heart size and mediastinal contours are unchanged. Aortic atherosclerosis. No overt pulmonary edema. No focal consolidation, pleural effusion, or pneumothorax. Degenerative changes of the thoracic spine and bilateral glenohumeral joints. IMPRESSION: No acute cardiopulmonary findings. Electronically Signed   By: Harrietta Sherry M.D.   On: 01/14/2024 14:19     Scheduled Meds:  apixaban   5 mg Oral BID   Chlorhexidine  Gluconate Cloth  6 each Topical Daily   cilostazol   50 mg Oral BID   furosemide   40 mg Intravenous BID   mupirocin  ointment  1 Application Nasal BID   pantoprazole   40 mg Oral Daily   potassium chloride  SA  40  mEq Oral BID   pravastatin   40 mg Oral QPM   Continuous Infusions:   LOS: 2 days    Time spent:    Sigurd Pac, MD [facility]   01/16/2024, 12:01 PM    "

## 2024-01-16 NOTE — Progress Notes (Addendum)
 Patient ID: Angie Carr, female   DOB: 03/09/1955, 69 y.o.   MRN: [id number]    Progress Note   Subjective   Day # 3 CC; dysphagia, anemia, in setting of congestive heart failure, and chronic anticoagulation  Eliquis /Pletal   Barium swallow-moderate to severe dysmotility, poor primary peristalsis with intermittent tertiary contractions and delay in transit of barium through the esophagus and into the stomach no hiatal hernia, barium tablet passed normally  Labs today WBC 4.8/hemoglobin 10/hematocrit 27.4/MCV 92.3 K+ 3.1/ Creat 0.99  Patient says she feels okay today, no dyspnea unless she gets up to stand or tries to ambulate, no complaints of abdominal pain or discomfort, reviewed barium swallow results.  We had been notified earlier this afternoon that patient had a bowel movement this morning that contained some bright red blood.  She has no complaints of rectal pain or discomfort,  Patient reports that she had a colonoscopy within the past 2 months done at [facility] and was told that she had a couple of polyps and otherwise negative colon  She does have diverticulosis noted on very recent CT scan from November 2025    Objective   Vital signs in last 24 hours: Temp:  [97.9 F (36.6 C)-98 F (36.7 C)] 98 F (36.7 C) (01/15 1106) Pulse Rate:  [85-120] 120 (01/15 1246) Resp:  [12-22] 16 (01/15 1400) BP: (93-128)/(63-78) 106/72 (01/15 1400) SpO2:  [98 %-100 %] 98 % (01/15 1106) Weight:  [67.6 kg] 67.6 kg (01/15 0300) Last BM Date : 01/16/24 General:    Older African-American female in NAD, pleasant Heart:  Regular rate and rhythm; no murmurs Lungs: Respirations even and unlabored, lungs CTA bilaterally Abdomen:  Soft, nontender and nondistended. Normal bowel sounds. Extremities:  Without edema. Neurologic:  Alert and oriented,  grossly normal neurologically. Psych:  Cooperative. Normal mood and affect.  Intake/Output from previous day: 01/14 0701 - 01/15 0700 In:  240 [P.O.:240] Out: 2200 [Urine:2200] Intake/Output this shift: Total I/O In: -  Out: 700 [Urine:700]  Lab Results: Recent Labs    01/14/24 1327 01/15/24 0315 01/15/24 1539 01/16/24 0236  WBC 3.8* 4.0  --  4.8  HGB 9.0* 7.0* 9.1* 10.0*  HCT 27.0* 20.3* 25.9* 27.4*  PLT 247 195  --  216   BMET Recent Labs    01/14/24 1327 01/15/24 0315 01/16/24 0236  NA 133* 136 135  K 4.6 3.5 3.1*  CL 104 106 103  CO2 15* 19* 20*  GLUCOSE 61* 59* 75  BUN 8 10 8   CREATININE 1.03* 1.00 0.99  CALCIUM  8.2* 7.4* 7.5*   LFT Recent Labs    01/15/24 0315  PROT 4.2*  ALBUMIN 2.3*  AST 32  ALT 38  ALKPHOS 71  BILITOT 0.8   PT/INR No results for input(s): LABPROT, INR in the last 72 hours.  Studies/Results: DG ESOPHAGUS W SINGLE CM (SOL OR THIN BA) Result Date: 01/16/2024 CLINICAL DATA:  [id number] Dysphagia [id number] Patient with a history of dysphagia with prior esophageal dilation November 2025. EXAM: ESOPHAGUS/BARIUM SWALLOW/TABLET STUDY TECHNIQUE: Single contrast examination was performed using thin liquid barium. This exam was performed by Warren Dais, NP, and was supervised and interpreted by Dr. Hughes. FLUOROSCOPY: Radiation Exposure Index (as provided by the fluoroscopic device): 11.70 mGy Kerma COMPARISON:  DG Esophagus 09/18/23. FINDINGS: Swallowing: Appears normal. No vestibular penetration or aspiration seen. Pharynx: Unremarkable. Esophagus: Normal appearance.  Prominent aortic notch. Esophageal motility: Moderate to severe dysmotility. Poor primary peristalsis with intermittent tertiary contractions. Delay in transit  of barium through the esophagus and into the stomach Hiatal Hernia: None. Gastroesophageal reflux: None visualized. Ingested 13mm barium tablet: Passed normally Other: None. IMPRESSION: Limited study due to patient's functional/clinical status. Moderate-to-severe esophageal dysmotility with poor peristalsis. Electronically Signed   By: Thom Hall M.D.   On:  01/16/2024 14:55   ECHOCARDIOGRAM COMPLETE Result Date: 01/15/2024    ECHOCARDIOGRAM REPORT   Patient Name:   Angie Carr Date of Exam: 01/15/2024 Medical Rec #:  [id number]          Height:       64.0 in Accession #:    [id number]         Weight:       171.7 lb Date of Birth:  1955/04/23          BSA:          1.834 m Patient Age:    68 years           BP:           111/75 mmHg Patient Gender: F                  HR:           93 bpm. Exam Location:  Inpatient Procedure: 2D Echo, Cardiac Doppler and Color Doppler (Both Spectral and Color            Flow Doppler were utilized during procedure). Indications:    Dyspnea  History:        Patient has prior history of Echocardiogram examinations, most                 recent 08/30/2023. CHF, Signs/Symptoms:Dyspnea, Hypotension and                 Dizziness/Lightheadedness; Risk Factors:Dyslipidemia, Diabetes,                 Hypertension and Current Smoker.  Sonographer:    Juliene Rucks Referring Phys: [id number] PROSPER M AMPONSAH  Sonographer Comments: Technically difficult study due to poor echo windows, suboptimal apical window, suboptimal parasternal window, suboptimal subcostal window and patient is obese. IMPRESSIONS  1. Left ventricular ejection fraction, by estimation, is 60 to 65%. The left ventricle has normal function. The left ventricle has no regional wall motion abnormalities. There is mild concentric left ventricular hypertrophy. Left ventricular diastolic parameters are consistent with Grade I diastolic dysfunction (impaired relaxation).  2. Right ventricular systolic function is normal. The right ventricular size is normal.  3. A small pericardial effusion is present.  4. The mitral valve is normal in structure. No evidence of mitral valve regurgitation. No evidence of mitral stenosis.  5. The aortic valve has an indeterminant number of cusps. Aortic valve regurgitation is trivial. No aortic stenosis is present.  6. The inferior vena cava is normal in  size with greater than 50% respiratory variability, suggesting right atrial pressure of 3 mmHg. FINDINGS  Left Ventricle: Left ventricular ejection fraction, by estimation, is 60 to 65%. The left ventricle has normal function. The left ventricle has no regional wall motion abnormalities. The left ventricular internal cavity size was normal in size. There is  mild concentric left ventricular hypertrophy. Left ventricular diastolic parameters are consistent with Grade I diastolic dysfunction (impaired relaxation). Right Ventricle: The right ventricular size is normal. Right ventricular systolic function is normal. Left Atrium: Left atrial size was normal in size. Right Atrium: Right atrial size was normal in size. Pericardium: A small pericardial effusion is  present. Mitral Valve: The mitral valve is normal in structure. Mild mitral annular calcification. No evidence of mitral valve regurgitation. No evidence of mitral valve stenosis. Tricuspid Valve: The tricuspid valve is normal in structure. Tricuspid valve regurgitation is trivial. No evidence of tricuspid stenosis. Aortic Valve: The aortic valve has an indeterminant number of cusps. Aortic valve regurgitation is trivial. No aortic stenosis is present. Pulmonic Valve: The pulmonic valve was not well visualized. Pulmonic valve regurgitation is not visualized. No evidence of pulmonic stenosis. Aorta: The aortic root is normal in size and structure. Venous: The inferior vena cava is normal in size with greater than 50% respiratory variability, suggesting right atrial pressure of 3 mmHg. IAS/Shunts: The interatrial septum was not well visualized.  LEFT VENTRICLE PLAX 2D LVIDd:         4.60 cm   Diastology LVIDs:         3.30 cm   LV e' medial:    7.51 cm/s LV PW:         1.30 cm   LV E/e' medial:  12.0 LV IVS:        1.10 cm   LV e' lateral:   7.94 cm/s LVOT diam:     1.70 cm   LV E/e' lateral: 11.3 LV SV:         41 LV SV Index:   22 LVOT Area:     2.27 cm  RIGHT  VENTRICLE TAPSE (M-mode): 1.9 cm LEFT ATRIUM         Index LA diam:    2.30 cm 1.25 cm/m  AORTIC VALVE LVOT Vmax:   121.00 cm/s LVOT Vmean:  82.300 cm/s LVOT VTI:    0.179 m  AORTA Ao Root diam: 3.60 cm MITRAL VALVE MV Area (PHT): 4.68 cm     SHUNTS MV Decel Time: 162 msec     Systemic VTI:  0.18 m MV E velocity: 90.10 cm/s   Systemic Diam: 1.70 cm MV A velocity: 137.00 cm/s MV E/A ratio:  0.66 Redell Shallow MD Electronically signed by Redell Shallow MD Signature Date/Time: 01/15/2024/1:19:43 PM    Final        Assessment / Plan:    #61 69 year old female admitted with acute on chronic diastolic heart failure, who we were asked to see yesterday for complaints of dysphagia.  Barium swallow from earlier today shows esophageal dysmotility moderate to severe with poor peristalsis, no hiatal hernia, barium tablet passed normally, no evidence of stricture  #2 single episode of bright red blood with bowel movement earlier today.  In setting of Eliquis  and Pletal  No recurrence since Patient had very recent colonoscopy done through [facility] with a couple of small polyps removed, she has previously documented diverticulosis from recent CT  Etiology of the bleeding not entirely clear, this may have been from internal hemorrhoids, possible diverticular etiology though has not had any further bleeding today  #3 history of PE/on chronic Eliquis  #4.  Peripheral arterial disease-on Pletal  #5 anemia chronic-hemoglobin improved after transfusion 2 days ago thus far stable  Plan; manage esophageal dysmotility supportively, upright 90 degrees for meals and for 1 hour postprandially, elevate head of bed to sleep 45 degrees, soft foods, eat slowly and sip fluids between bites  For episode of bleeding, will trend her hemoglobin and observe for any evidence of further bleeding-no plans for endoscopic evaluation at this time as she just had very recent outpatient colonoscopy.   Principal Problem:   Acute on  chronic diastolic (congestive)  heart failure (HCC) Active Problems:   Mixed hyperlipidemia   PAD (peripheral artery disease)   History of abdominal aortic aneurysm (AAA)     LOS: 2 days   Amy Esterwood PA-C 01/16/2024, 3:56 PM    Attending physician's note   I have taken history, reviewed the chart and examined the patient. I performed a substantive portion of this encounter, including complete performance of at least one of the key components, in conjunction with the APP. I agree with the Advanced Practitioner's note, impression and recommendations.   Ba Swallow -mod- severe esophageal dysmotility. Ba tablet passed without any problems. No residual stricture.  Acute on chronic anemia with 1 episode of rectal bleeding, negative colonoscopy @ [facility] 2 months ago. H/O hoids and div. Hb dropped to 7 s/p 2U to 9.0  H/O PE on Eliquis  (last dose 1/14), PAD on Pletal    Plan: - No need for repeat EGD or repeat colonoscopy at this time - Continue Protonix  40 mg p.o. daily - Trend CBC. Keep Hb>7 - Will follow along. - Aspiration precautions   Anselm Bring, MD [facility] [phone]

## 2024-01-16 NOTE — TOC Initial Note (Addendum)
 Transition of Care [facility]) - Initial/Assessment Note    Patient Details  Name: Angie Carr MRN: [id number] Date of Birth: 1955-11-08  Transition [facility] CM/SW Contact:    Roxie KANDICE Stain, RN Phone Number: 01/16/2024, 3:59 PM  Clinical Narrative:                 Spoke to patient regarding transition needs.   Patient lives with son and grandson.  Uses [employer] for transportation. Has all needed DME. Patient is active with [facility], reached out to [place] the liaison to find out what disciplines. Address, Phone number and PCP verified.  Need resumption home health PT and OT orders.  Expected Discharge Plan: Home w Home Health Services Barriers to Discharge: Continued Medical Work up   Patient Goals and CMS Choice Patient states their goals for this hospitalization and ongoing recovery are:: Get better CMS Medicare.gov Compare Post Acute Care list provided to:: Patient Choice offered to / list presented to : Patient      Expected Discharge Plan and Services   Discharge Planning Services: CM Consult Post Acute Care Choice: Home Health Living arrangements for the past 2 months: Single Family Home                           HH Arranged: PT, OT HH Agency: [facility]        Prior Living Arrangements/Services Living arrangements for the past 2 months: Single Family Home Lives with:: Adult Children, Other (Comment) Technical Sales Engineer) Patient language and need for interpreter reviewed:: Yes Do you feel safe going back to the place where you live?: Yes      Need for Family Participation in Patient Care: Yes (Comment) Care giver support system in place?: Yes (comment) Current home services: DME ([facility] [employer] [facility]) Criminal Activity/Legal Involvement Pertinent to Current Situation/Hospitalization: No - Comment as needed  Activities of Daily Living   ADL Screening (condition at time of admission) Independently performs ADLs?: No Does the patient have a  NEW difficulty with bathing/dressing/toileting/self-feeding that is expected to last >3 days?: Yes (Initiates electronic notice to provider for possible OT consult) Does the patient have a NEW difficulty with getting in/out of bed, walking, or climbing stairs that is expected to last >3 days?: Yes (Initiates electronic notice to provider for possible PT consult) Does the patient have a NEW difficulty with communication that is expected to last >3 days?: No Is the patient deaf or have difficulty hearing?: No Does the patient have difficulty seeing, even when wearing glasses/contacts?: No Does the patient have difficulty concentrating, remembering, or making decisions?: No  Permission Sought/Granted   Permission granted to share information with : Yes, Verbal Permission Granted     Permission granted to share info w AGENCY: [facility]        Emotional Assessment Appearance:: Appears stated age Attitude/Demeanor/Rapport: Engaged Affect (typically observed): Accepting Orientation: : Oriented to Self, Oriented to Place, Oriented to  Time, Oriented to Situation Alcohol  / Substance Use: Not Applicable Psych Involvement: No (comment)  Admission diagnosis:  Acute on chronic diastolic (congestive) heart failure (HCC) [I50.33] Acute on chronic congestive heart failure, unspecified heart failure type [facility]) [I50.9] Patient Active Problem List   Diagnosis Date Noted   History of abdominal aortic aneurysm (AAA) 01/15/2024   Acute on chronic diastolic (congestive) heart failure (HCC) 01/14/2024   Benign esophageal stricture 11/20/2023   Hiatal hernia 11/20/2023   Protein-calorie malnutrition, severe 11/18/2023  Esophageal dysphagia 11/18/2023   Abnormal finding on GI tract imaging 11/18/2023   Abdominal aortic aneurysm (AAA) >39 mm diameter 11/14/2023   Gastroesophageal reflux disease 11/14/2023   Generalized weakness 11/14/2023   Dizziness 11/14/2023   History of pulmonary embolism  11/14/2023   Hypokalemia 11/13/2023   Dysgeusia 09/18/2023   Nausea and vomiting 09/18/2023   Dehydration 09/15/2023   COVID-19 09/15/2023   Hypomagnesemia 09/15/2023   Protein calorie malnutrition 09/15/2023   Uncontrolled type 2 diabetes mellitus with hypoglycemia, without long-term current use of insulin  (HCC) 09/15/2023   PAD (peripheral artery disease) 08/29/2023   Diarrhea 08/29/2023   Pulmonary emboli (HCC) 08/29/2023   Pulmonary embolism (HCC) 08/28/2023   Febrile illness 12/11/2014   Severe sepsis (HCC) 12/11/2014   Hypotension 12/11/2014   Acute kidney injury 12/11/2014   Trichomonal vaginitis    Absolute anemia    Tobacco abuse 11/04/2012   Diabetes mellitus, type 2 (HCC) 03/18/2012   Morbid obesity (HCC) 03/18/2012   Hypertension associated with diabetes (HCC) 03/18/2012   Mixed hyperlipidemia 03/18/2012   Arthritis 03/18/2012   PCP:  Benjamine Aland, MD Pharmacy:   [facility] [address] ([place]), [place] - [address] [place] ([place]) [place] [address] Phone: [phone] Fax: [phone]  [facility] - [place], [place] - [address] [place] [address] Phone: [phone] Fax: [phone]  [facility] [address] [place] [address] Phone: [phone] Fax: [phone]  [facility] - [place], [place] - [address] [place] [address] Phone: [phone] Fax: [phone]     Social Drivers of Health (SDOH) Social History: SDOH Screenings   Food Insecurity: No Food Insecurity (01/15/2024)  Housing: Low Risk (01/15/2024)  Transportation Needs: No Transportation Needs (01/15/2024)  Utilities: Not At Risk (01/15/2024)  Depression (PHQ2-9): Low Risk (11/26/2023)  Financial Resource Strain: Low Risk (11/04/2023)  Physical Activity: Inactive (11/04/2023)  Social Connections: Moderately Integrated (01/15/2024)   Stress: No Stress Concern Present (11/04/2023)  Tobacco Use: High Risk (01/14/2024)  Health Literacy: Adequate Health Literacy (11/04/2023)   SDOH Interventions:     Readmission Risk Interventions    11/21/2023   10:33 AM 09/17/2023    3:57 PM 08/29/2023    2:12 PM  Readmission Risk Prevention Plan  Transportation Screening Complete Complete Complete  PCP or Specialist Appt within 5-7 Days Complete  Complete  Home Care Screening Complete Complete Complete  Medication Review (RN CM) Referral to Pharmacy Complete Referral to Pharmacy

## 2024-01-16 NOTE — Patient Instructions (Signed)
 Lenka Zhao Cobarrubias - I am sorry I was unable to reach you today for our scheduled appointment. I work with Benjamine Aland, MD and am calling to support your healthcare needs. Please contact me at [phone] at your earliest convenience. I look forward to speaking with you soon.   Thank you,  Warren Quivers RN CM Population Health-Complex Care Management [facility] [phone]

## 2024-01-16 NOTE — Evaluation (Signed)
 Physical Therapy Evaluation Patient Details Name: Angie Carr MRN: [id number] DOB: Jun 07, 1955 Today's Date: 01/16/2024  History of Present Illness  Pt is a 69 y.o female admitted 1/13 for SOB and BLE edema.  New dx of CHF. PMHx: DM2 , HTN, PVD, PE , anxiety, asthma,  HTN, neuromuscular disorder, COVID, DJD, esophageal stenosis  Clinical Impression  PTA pt reports living with wheelchair bound son (who has Aide assistance daily) and grandson in single story home with ramped entrance. Pt limited in safe mobility by stinging pain in her feet L>R with gentle touch and weightbearing, tachycardia with standing possibly due to increased pain, in presence of generalized weakness, and decreased balance. Pt report that pt used to take gabapentin  but MD had taken her off of it and her foot pain has increased. Pt is minA for bed mobility and modA for stand pivot transfer from bed to [facility] and back due to increased pain in standing. HR increased to 110s in standing. PT recommending HHPT at discharge. PT will continue to follow acutely and will refer to Mobility Specialist.        If plan is discharge home, recommend the following: A lot of help with walking and/or transfers;A lot of help with bathing/dressing/bathroom;Assistance with cooking/housework;Assist for transportation;Help with stairs or ramp for entrance   Can travel by private vehicle   Yes    Equipment Recommendations None recommended by PT     Functional Status Assessment Patient has had a recent decline in their functional status and demonstrates the ability to make significant improvements in function in a reasonable and predictable amount of time.     Precautions / Restrictions Precautions Precautions: Fall Recall of Precautions/Restrictions: Intact Precaution/Restrictions Comments: Watch HR Restrictions Weight Bearing Restrictions Per Provider Order: No      Mobility  Bed Mobility Overal bed mobility: Needs Assistance Bed  Mobility: Supine to Sit, Sit to Supine     Supine to sit: Supervision Sit to supine: Min assist   General bed mobility comments: supervision to come to EoB, minA for returning LE to bed    Transfers Overall transfer level: Needs assistance Equipment used: 1 person hand held assist Transfers: Sit to/from Stand, Bed to chair/wheelchair/BSC Sit to Stand: Min assist   Step pivot transfers: Mod assist       General transfer comment: minA for coming to standing pt becomes incontinent of urine with power up requires mod A for pivoting to BSC, mod A for pivot back to bed    Ambulation/Gait               General Gait Details: deferred due to pain in feet         Balance Overall balance assessment: Needs assistance Sitting-balance support: Bilateral upper extremity supported, Feet supported Sitting balance-Leahy Scale: Fair     Standing balance support: Bilateral upper extremity supported, During functional activity, Reliant on assistive device for balance Standing balance-Leahy Scale: Poor Standing balance comment: Reliant on UE support                             Pertinent Vitals/Pain Pain Assessment Pain Assessment: 0-10 Pain Score: 8  Pain Location: bilateral feet to mid calf with light touch and weightbearing Pain Descriptors / Indicators: Grimacing, Guarding, Pins and needles Pain Intervention(s): Limited activity within patient's tolerance, Premedicated before session, Monitored during session, Repositioned    Home Living Family/patient expects to be discharged to:: Private residence Living Arrangements:  Children;Other relatives (son who is w/c bound and grandson) Available Help at Discharge: Family;Available 24 hours/day (Son has HHRN) Type of Home: House Home Access: Ramped entrance       Home Layout: One level Home Equipment: Cane - single point;Rollator (4 wheels);Toilet riser;Shower seat;BSC/3in1 Additional Comments: son is ind at [facility]  level, he cannot provide physical assist    Prior Function Prior Level of Function : Independent/Modified Independent             Mobility Comments: Mod I with rollator ADLs Comments: Ind, assist for transportation     Extremity/Trunk Assessment   Upper Extremity Assessment Upper Extremity Assessment: Defer to OT evaluation    Lower Extremity Assessment Lower Extremity Assessment: RLE deficits/detail;LLE deficits/detail RLE Deficits / Details: pain from mid calf down made worse by touch or weightbearing, hip and knee ROM WFL, strength grossly 3/5, RLE Sensation: history of peripheral neuropathy RLE Coordination: decreased fine motor LLE Deficits / Details: pain from mid calf down made worse by touch or weightbearing, hip and knee ROM WFL, strength grossly 3/5, LLE Sensation: history of peripheral neuropathy LLE Coordination: decreased fine motor    Cervical / Trunk Assessment Cervical / Trunk Assessment: Kyphotic  Communication   Communication Communication: No apparent difficulties    Cognition Arousal: Alert Behavior During Therapy: WFL for tasks assessed/performed   PT - Cognitive impairments: No apparent impairments                         Following commands: Intact       Cueing Cueing Techniques: Verbal cues     General Comments General comments (skin integrity, edema, etc.): HR to 110s with getting up to standing        Assessment/Plan    PT Assessment Patient needs continued PT services  PT Problem List Decreased strength;Decreased activity tolerance;Decreased balance;Decreased mobility;Cardiopulmonary status limiting activity;Impaired sensation;Pain       PT Treatment Interventions DME instruction;Gait training;Functional mobility training;Therapeutic activities;Therapeutic exercise;Balance training;Cognitive remediation;Patient/family education    PT Goals (Current goals can be found in the Care Plan section)  Acute Rehab PT Goals PT  Goal Formulation: With patient Time For Goal Achievement: 01/30/24 Potential to Achieve Goals: Fair    Frequency Min 2X/week        AM-PAC PT 6 Clicks Mobility  Outcome Measure Help needed turning from your back to your side while in a flat bed without using bedrails?: None Help needed moving from lying on your back to sitting on the side of a flat bed without using bedrails?: A Little Help needed moving to and from a bed to a chair (including a wheelchair)?: A Lot Help needed standing up from a chair using your arms (e.g., wheelchair or bedside chair)?: A Little Help needed to walk in hospital room?: A Lot Help needed climbing 3-5 steps with a railing? : Total 6 Click Score: 15    End of Session Equipment Utilized During Treatment: Gait belt Activity Tolerance: Patient limited by pain Patient left: in bed;with call bell/phone within reach;Other (comment) (GI NP in room) Nurse Communication: Mobility status;Other (comment) (urine in hat in [facility]) PT Visit Diagnosis: Unsteadiness on feet (R26.81);Other abnormalities of gait and mobility (R26.89);Muscle weakness (generalized) (M62.81);Difficulty in walking, not elsewhere classified (R26.2);Other symptoms and signs involving the nervous system (R29.898);Pain Pain - Right/Left:  (bilateral) Pain - part of body: Ankle and joints of foot    Time: 8381-8351 PT Time Calculation (min) (ACUTE ONLY): 30 min  Charges:   PT Evaluation $PT Eval Moderate Complexity: 1 Mod PT Treatments $Therapeutic Activity: 8-22 mins PT General Charges $$ ACUTE PT VISIT: 1 Visit         Ettamae Barkett B. Fleeta Lapidus PT, DPT Acute Rehabilitation Services Please use secure chat or  Call Office [phone]   Almarie KATHEE Fleeta [facility] 01/16/2024, 5:09 PM

## 2024-01-17 DIAGNOSIS — R1310 Dysphagia, unspecified: Secondary | ICD-10-CM | POA: Diagnosis not present

## 2024-01-17 DIAGNOSIS — D649 Anemia, unspecified: Secondary | ICD-10-CM | POA: Diagnosis not present

## 2024-01-17 DIAGNOSIS — K224 Dyskinesia of esophagus: Secondary | ICD-10-CM | POA: Diagnosis not present

## 2024-01-17 DIAGNOSIS — I5033 Acute on chronic diastolic (congestive) heart failure: Secondary | ICD-10-CM | POA: Diagnosis not present

## 2024-01-17 LAB — GLUCOSE, CAPILLARY
Glucose-Capillary: 70 mg/dL (ref 70–99)
Glucose-Capillary: 78 mg/dL (ref 70–99)
Glucose-Capillary: 86 mg/dL (ref 70–99)
Glucose-Capillary: 77 mg/dL (ref 70–99)

## 2024-01-17 LAB — CBC
HCT: 29.2 % — ABNORMAL LOW (ref 36.0–46.0)
Hemoglobin: 10.6 g/dL — ABNORMAL LOW (ref 12.0–15.0)
MCH: 33.4 pg (ref 26.0–34.0)
MCHC: 36.3 g/dL — ABNORMAL HIGH (ref 30.0–36.0)
MCV: 92.1 fL (ref 80.0–100.0)
Platelets: 210 K/uL (ref 150–400)
RBC: 3.17 MIL/uL — ABNORMAL LOW (ref 3.87–5.11)
RDW: 21 % — ABNORMAL HIGH (ref 11.5–15.5)
WBC: 4.8 K/uL (ref 4.0–10.5)
nRBC: 0.8 % — ABNORMAL HIGH (ref 0.0–0.2)

## 2024-01-17 LAB — BASIC METABOLIC PANEL WITH GFR
Anion gap: 12 (ref 5–15)
BUN: 10 mg/dL (ref 8–23)
CO2: 22 mmol/L (ref 22–32)
Calcium: 7.5 mg/dL — ABNORMAL LOW (ref 8.9–10.3)
Chloride: 102 mmol/L (ref 98–111)
Creatinine, Ser: 1.1 mg/dL — ABNORMAL HIGH (ref 0.44–1.00)
GFR, Estimated: 54 mL/min — ABNORMAL LOW
Glucose, Bld: 72 mg/dL (ref 70–99)
Potassium: 3.7 mmol/L (ref 3.5–5.1)
Sodium: 136 mmol/L (ref 135–145)

## 2024-01-17 LAB — TSH: TSH: 3.52 u[IU]/mL (ref 0.350–4.500)

## 2024-01-17 MED ORDER — POTASSIUM CHLORIDE CRYS ER 20 MEQ PO TBCR
20.0000 meq | EXTENDED_RELEASE_TABLET | Freq: Once | ORAL | Status: AC
  Administered 2024-01-17: 20 meq via ORAL
  Filled 2024-01-17: qty 1

## 2024-01-17 MED ORDER — HYDROCORTISONE ACETATE 25 MG RE SUPP
25.0000 mg | Freq: Two times a day (BID) | RECTAL | Status: DC
  Administered 2024-01-17 – 2024-01-18 (×2): 25 mg via RECTAL
  Filled 2024-01-17 (×4): qty 1

## 2024-01-17 MED ORDER — FUROSEMIDE 10 MG/ML IJ SOLN
40.0000 mg | Freq: Once | INTRAMUSCULAR | Status: AC
  Administered 2024-01-17: 40 mg via INTRAVENOUS

## 2024-01-17 NOTE — Progress Notes (Signed)
 Mobility Specialist Progress Note;    01/17/24 0935  Therapy Vitals  Pulse Rate (!) 111  BP 110/68  Mobility  Activity Ambulated with assistance;Pivoted/transferred from bed to chair  Level of Assistance Minimal assist, patient does 75% or more (+2)  Assistive Device Other (Comment) (Hand hled assist)  Distance Ambulated (ft) 2 ft  Activity Response Tolerated well  Mobility Referral Yes  Mobility visit 1 Mobility  Mobility Specialist Start Time (ACUTE ONLY) 0935  Mobility Specialist Stop Time (ACUTE ONLY) [id number]  Mobility Specialist Time Calculation (min) (ACUTE ONLY) 17 min   Pt received on EOB getting bathed, agreeable to mobility. MinA +2 for STS. Once stood pt pivoted to the chair via HHA, CGA for safety. Pt left with call bell and personal belongings within reach. All needs met. NT aware.  Angie Carr, BS Mobility Specialist Please contact via Special Educational Needs Teacher or [employer] [phone]

## 2024-01-17 NOTE — Progress Notes (Signed)
 " PROGRESS NOTE    [name]  FMW:[id number] DOB: [birth year] DOA: 01/14/2024 PCP: Benjamine Aland, MD  68/F w esophageal stenosis, recent dilation, history of PE on Eliquis , type 2 diabetes mellitus, hypertension, DJD, chronic anemia presented to the ED with shortness of breath and leg swelling over the last few weeks. -Patient reports having her esophagus stretched in November, felt better for a few days and then shortly thereafter started having difficulty swallowing again, reports very poor p.o. intake and failure to thrive as a result.  Few weeks ago developed leg swelling and some shortness of breath.  In the ED mildly tachycardic, labs sodium 1333, creatinine 1.03, hemoglobin 9, proBNP 1157, troponin 33, 28 chest x-ray with no active disease. - Repeat hemoglobin dropped to 7, transfuse PRBC, stool with blood - GI consulting, esophagram with severe dysmotility  Subjective: - Continues to have issues with swallowing, oral intake is poor, breathing better swelling is down  Assessment and Plan:  Acute on chronic diastolic heart failure - Last TTE on 08/2023 shows EF 55-60%, Moderate LVH, and G1DD  -Likely exacerbated by anemia - Improving with diuresis, 3.3 L negative, appears close to euvolemic, cut down Lasix  after a.m. dose, switch to oral diuretics tomorrow, continue Jardiance, Toprol   - Increase activity, discharge planning  Acute on chronic anemia -Hemoglobin 7 on admission, normocytic, transfused 1 unit PRBC - Anemia panel not calculated, ferritin was elevated, serum iron was normal - Reports having a colonoscopy in the last few months which was allegedly unremarkable, through [facility] medical, dark blood noted with BM yesterday -Recent EGD and dilation, colonoscopy with polyps removed, GI following no further plans for endoscopic evaluation at this time - On Eliquis  with history of PE, continued  Esophageal stenosis Esophageal dysmotility - Noted on endoscopy 11/20/2023, sp  dilation - Biopsy done noted candidiasis, negative for malignancy - History of intermittent worsening dysphagia for over a month, GI following, esophagram with moderate to severe dysmotility, aspiration precautions, continue PPI  T2DM - Well-controlled, last A1c 4.8% 5 months ago - SSI with meals, CBG monitoring   # Hx of pulmonary embolism - Continue Eliquis    # AAA - CTA chest in Nov 2025 showed a 4.1 cm ascending aortic aneurysm - Needs annual imaging follow-up    # PAD - Continue cilostazol    # HLD -Continue pravastatin    # GERD - Continue PPI     DVT prophylaxis:eliquis  Code Status: Full Code Family Communication: none present Disposition Plan: Home likely tomorrow  Consultants: GI   Procedures:   Antimicrobials:    Objective: Vitals:   01/16/24 2300 01/17/24 0300 01/17/24 0423 01/17/24 0757  BP: 90/61 93/69  110/68  Pulse: 91 84  86  Resp: 14 17  18   Temp: 98 F (36.7 C) 98.2 F (36.8 C)  97.6 F (36.4 C)  TempSrc: Oral Oral  Oral  SpO2: 98% 97%  100%  Weight:   66.2 kg   Height:        Intake/Output Summary (Last 24 hours) at 01/17/2024 0917 Last data filed at 01/17/2024 0700 Gross per 24 hour  Intake 476 ml  Output 1625 ml  Net -1149 ml   Filed Weights   01/14/24 1312 01/16/24 0300 01/17/24 0423  Weight: 77.9 kg 67.6 kg 66.2 kg    Examination:  General exam: Frail, chronically ill-appearing, AAO x 3 Respiratory system: Improved air movement Cardiovascular system: S1 & S2 heard, RRR.  Abd: nondistended, soft and nontender.Normal bowel sounds heard. Central nervous system:  Alert and oriented. No focal neurological deficits. Extremities: Trace edema Skin: No rashes Psychiatry:  Mood & affect appropriate.     Data Reviewed:   CBC: Recent Labs  Lab 01/14/24 1327 01/15/24 0315 01/15/24 1539 01/16/24 0236 01/17/24 0229  WBC 3.8* 4.0  --  4.8 4.8  NEUTROABS 2.0  --   --   --   --   HGB 9.0* 7.0* 9.1* 10.0* 10.6*  HCT 27.0*  20.3* 25.9* 27.4* 29.2*  MCV 102.7* 98.5  --  92.3 92.1  PLT 247 195  --  216 210   Basic Metabolic Panel: Recent Labs  Lab 01/14/24 1327 01/15/24 0315 01/16/24 0236 01/17/24 0229  NA 133* 136 135 136  K 4.6 3.5 3.1* 3.7  CL 104 106 103 102  CO2 15* 19* 20* 22  GLUCOSE 61* 59* 75 72  BUN 8 10 8 10   CREATININE 1.03* 1.00 0.99 1.10*  CALCIUM  8.2* 7.4* 7.5* 7.5*  MG  --  1.6*  --   --    GFR: Estimated Creatinine Clearance: 45.2 mL/min (A) (by C-G formula based on SCr of 1.1 mg/dL (H)). Liver Function Tests: Recent Labs  Lab 01/14/24 1327 01/15/24 0315  AST 45* 32  ALT 50* 38  ALKPHOS 95 71  BILITOT 1.2 0.8  PROT 5.7* 4.2*  ALBUMIN 2.9* 2.3*   No results for input(s): LIPASE, AMYLASE in the last 168 hours. No results for input(s): AMMONIA in the last 168 hours. Coagulation Profile: No results for input(s): INR, PROTIME in the last 168 hours. Cardiac Enzymes: No results for input(s): CKTOTAL, CKMB, CKMBINDEX, TROPONINI in the last 168 hours. BNP (last 3 results) Recent Labs    01/14/24 1327  PROBNP 1,157.0*   HbA1C: No results for input(s): HGBA1C in the last 72 hours. CBG: Recent Labs  Lab 01/16/24 0716 01/16/24 1105 01/16/24 1654 01/16/24 2118 01/17/24 0608  GLUCAP 83 75 96 84 70   Lipid Profile: No results for input(s): CHOL, HDL, LDLCALC, TRIG, CHOLHDL, LDLDIRECT in the last 72 hours. Thyroid  Function Tests: Recent Labs    01/17/24 0229  TSH 3.520   Anemia Panel: Recent Labs    01/15/24 0752  VITAMINB12 1,001*  FOLATE 13.5  FERRITIN 463*  TIBC NOT CALCULATED  IRON 74  RETICCTPCT 3.2*   Urine analysis:    Component Value Date/Time   COLORURINE AMBER (A) 09/15/2023 2249   APPEARANCEUR HAZY (A) 09/15/2023 2249   LABSPEC 1.027 09/15/2023 2249   PHURINE 6.0 09/15/2023 2249   GLUCOSEU NEGATIVE 09/15/2023 2249   HGBUR MODERATE (A) 09/15/2023 2249   BILIRUBINUR SMALL (A) 09/15/2023 2249   KETONESUR 80 (A)  09/15/2023 2249   PROTEINUR 100 (A) 09/15/2023 2249   NITRITE NEGATIVE 09/15/2023 2249   LEUKOCYTESUR TRACE (A) 09/15/2023 2249   Sepsis Labs: @LABRCNTIP (procalcitonin:4,lacticidven:4)  ) Recent Results (from the past 240 hours)  MRSA Next Gen by PCR, Nasal     Status: Abnormal   Collection Time: 01/15/24 11:40 PM   Specimen: Nasal Mucosa; Nasal Swab  Result Value Ref Range Status   MRSA by PCR Next Gen DETECTED (A) NOT DETECTED Final    Comment: CRITICAL RESULT CALLED TO, READ BACK BY AND VERIFIED WITH:  MEAGAN 01/16/2024 BY DD @ [phone] (NOTE) The GeneXpert MRSA Assay (FDA approved for NASAL specimens only), is one component of a comprehensive MRSA colonization surveillance program. It is not intended to diagnose MRSA infection nor to guide or monitor treatment for MRSA infections. Test performance is not FDA approved  in patients less than 73 years old. Performed at [facility], [address] [place], [place] [address]      Radiology Studies: DG ESOPHAGUS W SINGLE CM (SOL OR THIN BA) Result Date: 01/16/2024 CLINICAL DATA:  [id number] Dysphagia [id number] Patient with a history of dysphagia with prior esophageal dilation November 2025. EXAM: ESOPHAGUS/BARIUM SWALLOW/TABLET STUDY TECHNIQUE: Single contrast examination was performed using thin liquid barium. This exam was performed by Warren Dais, NP, and was supervised and interpreted by Dr. Hughes. FLUOROSCOPY: Radiation Exposure Index (as provided by the fluoroscopic device): 11.70 mGy Kerma COMPARISON:  DG Esophagus 09/18/23. FINDINGS: Swallowing: Appears normal. No vestibular penetration or aspiration seen. Pharynx: Unremarkable. Esophagus: Normal appearance.  Prominent aortic notch. Esophageal motility: Moderate to severe dysmotility. Poor primary peristalsis with intermittent tertiary contractions. Delay in transit of barium through the esophagus and into the stomach Hiatal Hernia: None. Gastroesophageal reflux: None visualized.  Ingested 13mm barium tablet: Passed normally Other: None. IMPRESSION: Limited study due to patient's functional/clinical status. Moderate-to-severe esophageal dysmotility with poor peristalsis. Electronically Signed   By: Thom Hughes M.D.   On: 01/16/2024 14:55   ECHOCARDIOGRAM COMPLETE Result Date: 01/15/2024    ECHOCARDIOGRAM REPORT   Patient Name:   ANVITA HIRATA Date of Exam: 01/15/2024 Medical Rec #:  [id number]          Height:       64.0 in Accession #:    [id number]         Weight:       171.7 lb Date of Birth:  18-Nov-1955          BSA:          1.834 m Patient Age:    68 years           BP:           111/75 mmHg Patient Gender: F                  HR:           93 bpm. Exam Location:  Inpatient Procedure: 2D Echo, Cardiac Doppler and Color Doppler (Both Spectral and Color            Flow Doppler were utilized during procedure). Indications:    Dyspnea  History:        Patient has prior history of Echocardiogram examinations, most                 recent 08/30/2023. CHF, Signs/Symptoms:Dyspnea, Hypotension and                 Dizziness/Lightheadedness; Risk Factors:Dyslipidemia, Diabetes,                 Hypertension and Current Smoker.  Sonographer:    Juliene Rucks Referring Phys: [id number] PROSPER M AMPONSAH  Sonographer Comments: Technically difficult study due to poor echo windows, suboptimal apical window, suboptimal parasternal window, suboptimal subcostal window and patient is obese. IMPRESSIONS  1. Left ventricular ejection fraction, by estimation, is 60 to 65%. The left ventricle has normal function. The left ventricle has no regional wall motion abnormalities. There is mild concentric left ventricular hypertrophy. Left ventricular diastolic parameters are consistent with Grade I diastolic dysfunction (impaired relaxation).  2. Right ventricular systolic function is normal. The right ventricular size is normal.  3. A small pericardial effusion is present.  4. The mitral valve is normal in  structure. No evidence of mitral valve regurgitation. No evidence of mitral stenosis.  5.  The aortic valve has an indeterminant number of cusps. Aortic valve regurgitation is trivial. No aortic stenosis is present.  6. The inferior vena cava is normal in size with greater than 50% respiratory variability, suggesting right atrial pressure of 3 mmHg. FINDINGS  Left Ventricle: Left ventricular ejection fraction, by estimation, is 60 to 65%. The left ventricle has normal function. The left ventricle has no regional wall motion abnormalities. The left ventricular internal cavity size was normal in size. There is  mild concentric left ventricular hypertrophy. Left ventricular diastolic parameters are consistent with Grade I diastolic dysfunction (impaired relaxation). Right Ventricle: The right ventricular size is normal. Right ventricular systolic function is normal. Left Atrium: Left atrial size was normal in size. Right Atrium: Right atrial size was normal in size. Pericardium: A small pericardial effusion is present. Mitral Valve: The mitral valve is normal in structure. Mild mitral annular calcification. No evidence of mitral valve regurgitation. No evidence of mitral valve stenosis. Tricuspid Valve: The tricuspid valve is normal in structure. Tricuspid valve regurgitation is trivial. No evidence of tricuspid stenosis. Aortic Valve: The aortic valve has an indeterminant number of cusps. Aortic valve regurgitation is trivial. No aortic stenosis is present. Pulmonic Valve: The pulmonic valve was not well visualized. Pulmonic valve regurgitation is not visualized. No evidence of pulmonic stenosis. Aorta: The aortic root is normal in size and structure. Venous: The inferior vena cava is normal in size with greater than 50% respiratory variability, suggesting right atrial pressure of 3 mmHg. IAS/Shunts: The interatrial septum was not well visualized.  LEFT VENTRICLE PLAX 2D LVIDd:         4.60 cm   Diastology LVIDs:          3.30 cm   LV e' medial:    7.51 cm/s LV PW:         1.30 cm   LV E/e' medial:  12.0 LV IVS:        1.10 cm   LV e' lateral:   7.94 cm/s LVOT diam:     1.70 cm   LV E/e' lateral: 11.3 LV SV:         41 LV SV Index:   22 LVOT Area:     2.27 cm  RIGHT VENTRICLE TAPSE (M-mode): 1.9 cm LEFT ATRIUM         Index LA diam:    2.30 cm 1.25 cm/m  AORTIC VALVE LVOT Vmax:   121.00 cm/s LVOT Vmean:  82.300 cm/s LVOT VTI:    0.179 m  AORTA Ao Root diam: 3.60 cm MITRAL VALVE MV Area (PHT): 4.68 cm     SHUNTS MV Decel Time: 162 msec     Systemic VTI:  0.18 m MV E velocity: 90.10 cm/s   Systemic Diam: 1.70 cm MV A velocity: 137.00 cm/s MV E/A ratio:  0.66 Redell Shallow MD Electronically signed by Redell Shallow MD Signature Date/Time: 01/15/2024/1:19:43 PM    Final      Scheduled Meds:  Chlorhexidine  Gluconate Cloth  6 each Topical Daily   cilostazol   50 mg Oral BID   furosemide   40 mg Intravenous BID   metoprolol  tartrate  12.5 mg Oral BID   mupirocin  ointment  1 Application Nasal BID   pantoprazole   40 mg Oral Daily   pravastatin   40 mg Oral QPM   Continuous Infusions:   LOS: 3 days    Time spent:    Sigurd Pac, MD [facility]   01/17/2024, 9:17 AM    "

## 2024-01-17 NOTE — Plan of Care (Signed)
" °  Problem: Activity: Goal: Capacity to carry out activities will improve Outcome: Progressing   Problem: Cardiac: Goal: Ability to achieve and maintain adequate cardiopulmonary perfusion will improve Outcome: Progressing   Problem: Activity: Goal: Risk for activity intolerance will decrease Outcome: Progressing   Problem: Nutrition: Goal: Adequate nutrition will be maintained Outcome: Progressing   Problem: Coping: Goal: Level of anxiety will decrease Outcome: Progressing   Problem: Elimination: Goal: Will not experience complications related to bowel motility Outcome: Progressing Goal: Will not experience complications related to urinary retention Outcome: Progressing   "

## 2024-01-17 NOTE — Progress Notes (Signed)
 Heart Failure Navigator Progress Note  Assessed for Heart & Vascular TOC clinic readiness.  Patient does not meet criteria due to EF 60-65%, no HF TOC per Dr. Fairy.   Navigator available for reassessment of patient.   Duwaine Plant, PharmD, BCPS Heart Failure Stewardship Pharmacist Phone [phone]

## 2024-01-17 NOTE — Care Management Important Message (Signed)
 Important Message  Patient Details  Name: Angie Carr MRN: [id number] Date of Birth: Jun 12, 1955   Important Message Given:  Yes - Medicare IM     Claretta Deed 01/17/2024, 2:43 PM

## 2024-01-17 NOTE — Plan of Care (Signed)
   Problem: Education: Goal: Knowledge of General Education information will improve Description: Including pain rating scale, medication(s)/side effects and non-pharmacologic comfort measures Outcome: Progressing   Problem: Nutrition: Goal: Adequate nutrition will be maintained Outcome: Progressing   Problem: Coping: Goal: Level of anxiety will decrease Outcome: Progressing

## 2024-01-17 NOTE — Progress Notes (Addendum)
 Patient ID: Angie Carr, female   DOB: [birth year], 69 y.o.   MRN: [id number]    Progress Note   Subjective   Day # 3 CC; acute on chronic congestive heart failure, mild dysphagia, episode of rectal bleeding yesterday  Patient without complaints this morning, no further bowel movements or bleeding  Labs today WBC 4.8/hemoglobin 10.6/hematocrit 29.2 improved Potassium 3.7/BUN 10/creatinine 1.10   Objective   Vital signs in last 24 hours: Temp:  [97.6 F (36.4 C)-98.2 F (36.8 C)] 97.6 F (36.4 C) (01/16 0757) Pulse Rate:  [84-120] 111 (01/16 0935) Resp:  [14-18] 18 (01/16 0757) BP: (90-112)/(60-78) 110/68 (01/16 0935) SpO2:  [96 %-100 %] 100 % (01/16 0757) Weight:  [66.2 kg] 66.2 kg (01/16 0423) Last BM Date : 01/16/24 General:    Older African-American  female in NAD Heart:  Regular rate and rhythm; no murmurs Lungs: Respirations even and unlabored, lungs CTA bilaterally Abdomen:  Soft, nontender and nondistended. Normal bowel sounds. Extremities:  Without edema. Neurologic:  Alert and oriented,  grossly normal neurologically. Psych:  Cooperative. Normal mood and affect.  Intake/Output from previous day: 01/15 0701 - 01/16 0700 In: 476 [P.O.:476] Out: 1625 [Urine:1625] Intake/Output this shift: No intake/output data recorded.  Lab Results: Recent Labs    01/15/24 0315 01/15/24 1539 01/16/24 0236 01/17/24 0229  WBC 4.0  --  4.8 4.8  HGB 7.0* 9.1* 10.0* 10.6*  HCT 20.3* 25.9* 27.4* 29.2*  PLT 195  --  216 210   BMET Recent Labs    01/15/24 0315 01/16/24 0236 01/17/24 0229  NA 136 135 136  K 3.5 3.1* 3.7  CL 106 103 102  CO2 19* 20* 22  GLUCOSE 59* 75 72  BUN 10 8 10   CREATININE 1.00 0.99 1.10*  CALCIUM  7.4* 7.5* 7.5*   LFT Recent Labs    01/15/24 0315  PROT 4.2*  ALBUMIN 2.3*  AST 32  ALT 38  ALKPHOS 71  BILITOT 0.8   PT/INR No results for input(s): LABPROT, INR in the last 72 hours.  Studies/Results: DG ESOPHAGUS W SINGLE CM  (SOL OR THIN BA) Result Date: 01/16/2024 CLINICAL DATA:  356272 Dysphagia [id number] Patient with a history of dysphagia with prior esophageal dilation November 2025. EXAM: ESOPHAGUS/BARIUM SWALLOW/TABLET STUDY TECHNIQUE: Single contrast examination was performed using thin liquid barium. This exam was performed by Warren Dais, NP, and was supervised and interpreted by Dr. Hughes. FLUOROSCOPY: Radiation Exposure Index (as provided by the fluoroscopic device): 11.70 mGy Kerma COMPARISON:  DG Esophagus 09/18/23. FINDINGS: Swallowing: Appears normal. No vestibular penetration or aspiration seen. Pharynx: Unremarkable. Esophagus: Normal appearance.  Prominent aortic notch. Esophageal motility: Moderate to severe dysmotility. Poor primary peristalsis with intermittent tertiary contractions. Delay in transit of barium through the esophagus and into the stomach Hiatal Hernia: None. Gastroesophageal reflux: None visualized. Ingested 13mm barium tablet: Passed normally Other: None. IMPRESSION: Limited study due to patient's functional/clinical status. Moderate-to-severe esophageal dysmotility with poor peristalsis. Electronically Signed   By: Thom Hughes M.D.   On: 01/16/2024 14:55       Assessment / Plan:    #15 69 year old female admitted with acute on chronic diastolic heart failure who we were initially asked to see for complaints of dysphagia. She underwent barium swallow yesterday that shows esophageal dysmotility moderate to severe with poor peristalsis, no hiatal hernia, barium tablet passed normally.  #2 single episode of bright red blood with bowel movement yesterday, no recurrence, this is in the setting of Eliquis  and Pletal   Very  recent colonoscopy outpatient through [facility] with a couple of small polyps removed, has previously documented diverticulosis from recent CT.  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  Peripheral arterial disease on Pletal  5.  Anemia chronic-stable over the past couple of days post transfusion  Plan; manage dysphagia symptoms supportively with upright position for all meals 90 degrees and upright for 1 hour postprandially all meals, elevate head of bed at nighttime, soft diet, chew carefully eat slowly and sip fluids between bites  Will add a course of Anusol  suppositories twice daily x 5 days, then can continue at home as needed.  GI will sign off, available if needed.  She should follow-up with [facility], from a GI perspective on discharge.     Principal Problem:   Acute on chronic diastolic (congestive) heart failure (HCC) Active Problems:   Mixed hyperlipidemia   PAD (peripheral artery disease)   History of abdominal aortic aneurysm (AAA)     LOS: 3 days   Amy Esterwood PA-C 01/17/2024, 11:10 AM    Attending physician's note   I have taken history, reviewed the chart and examined the patient. I performed a substantive portion of this encounter, including complete performance of at least one of the key components, in conjunction with the APP. I agree with the Advanced Practitioner's note, impression and recommendations.   No further bleeding. Hb 10.6. Neg recent colonoscopy 2 months ago at [facility]  Denies having any dysphagia.  Barium swallow showing moderate to severe esophageal dysmotility.  The barium tablet passed without any problems.  No residual stricture. S/P EGD with dil 20 mm 11/2023 with resolution of dysphagia.  H/O PE on Eliquis , PAD on Pletal    Plan: - Continue Protonix  40 mg p.o. daily - FU @[facility]. - Will sign off for now    Anselm Bring, MD [facility] [phone]

## 2024-01-18 ENCOUNTER — Other Ambulatory Visit (HOSPITAL_COMMUNITY): Payer: Self-pay

## 2024-01-18 DIAGNOSIS — I5033 Acute on chronic diastolic (congestive) heart failure: Secondary | ICD-10-CM | POA: Diagnosis not present

## 2024-01-18 LAB — GLUCOSE, CAPILLARY: Glucose-Capillary: 74 mg/dL (ref 70–99)

## 2024-01-18 LAB — CBC
HCT: 28.1 % — ABNORMAL LOW (ref 36.0–46.0)
Hemoglobin: 10 g/dL — ABNORMAL LOW (ref 12.0–15.0)
MCH: 33.6 pg (ref 26.0–34.0)
MCHC: 35.6 g/dL (ref 30.0–36.0)
MCV: 94.3 fL (ref 80.0–100.0)
Platelets: 206 K/uL (ref 150–400)
RBC: 2.98 MIL/uL — ABNORMAL LOW (ref 3.87–5.11)
RDW: 21.4 % — ABNORMAL HIGH (ref 11.5–15.5)
WBC: 4.7 K/uL (ref 4.0–10.5)
nRBC: 0.6 % — ABNORMAL HIGH (ref 0.0–0.2)

## 2024-01-18 LAB — BASIC METABOLIC PANEL WITH GFR
Anion gap: 13 (ref 5–15)
BUN: 14 mg/dL (ref 8–23)
CO2: 23 mmol/L (ref 22–32)
Calcium: 7.1 mg/dL — ABNORMAL LOW (ref 8.9–10.3)
Chloride: 104 mmol/L (ref 98–111)
Creatinine, Ser: 1.27 mg/dL — ABNORMAL HIGH (ref 0.44–1.00)
GFR, Estimated: 46 mL/min — ABNORMAL LOW
Glucose, Bld: 71 mg/dL (ref 70–99)
Potassium: 3.8 mmol/L (ref 3.5–5.1)
Sodium: 140 mmol/L (ref 135–145)

## 2024-01-18 MED ORDER — METOPROLOL TARTRATE 25 MG PO TABS
12.5000 mg | ORAL_TABLET | Freq: Two times a day (BID) | ORAL | 0 refills | Status: AC
  Filled 2024-01-18: qty 60, 60d supply, fill #0

## 2024-01-18 MED ORDER — APIXABAN 5 MG PO TABS
5.0000 mg | ORAL_TABLET | Freq: Two times a day (BID) | ORAL | Status: DC
  Administered 2024-01-18: 5 mg via ORAL
  Filled 2024-01-18: qty 1

## 2024-01-18 MED ORDER — TORSEMIDE 20 MG PO TABS
20.0000 mg | ORAL_TABLET | Freq: Every day | ORAL | 0 refills | Status: AC
  Filled 2024-01-18: qty 30, 30d supply, fill #0

## 2024-01-18 MED ORDER — TORSEMIDE 20 MG PO TABS
20.0000 mg | ORAL_TABLET | Freq: Every day | ORAL | Status: DC
  Administered 2024-01-18: 20 mg via ORAL
  Filled 2024-01-18: qty 1

## 2024-01-18 MED ORDER — PANTOPRAZOLE SODIUM 40 MG PO TBEC
40.0000 mg | DELAYED_RELEASE_TABLET | Freq: Every day | ORAL | 0 refills | Status: AC
  Filled 2024-01-18: qty 30, 30d supply, fill #0

## 2024-01-18 NOTE — Plan of Care (Signed)
  Problem: Education: Goal: Ability to demonstrate management of disease process will improve Outcome: Progressing Goal: Ability to verbalize understanding of medication therapies will improve Outcome: Progressing   Problem: Education: Goal: Knowledge of General Education information will improve Description Including pain rating scale, medication(s)/side effects and non-pharmacologic comfort measures Outcome: Progressing   

## 2024-01-18 NOTE — Discharge Summary (Signed)
 Physician Discharge Summary  Angie Carr FMW:[id number] DOB: 11/10/1955 DOA: 01/14/2024  PCP: Benjamine Aland, MD  Admit date: 01/14/2024 Discharge date: 01/18/2024  Time spent: 45 minutes  Recommendations for Outpatient Follow-up:  GI at [facility] in 1 month [facility] in 2 to 3 weeks, referral sent, consider SGLT2i at follow-up PCP in 1 week, check CBC and BMP at follow-up   Discharge Diagnoses:  Principal Problem:   Acute on chronic diastolic (congestive) heart failure (HCC) Iron deficiency anemia Esophageal stenosis sp dilation 11/25 Moderate severe esophageal dysmotility   Mixed hyperlipidemia   PAD (peripheral artery disease)   History of abdominal aortic aneurysm (AAA)   Discharge Condition: Improved  Diet recommendation: Heart healthy  Filed Weights   01/16/24 0300 01/17/24 0423 01/18/24 0412  Weight: 67.6 kg 66.2 kg 64.8 kg    History of present illness:  69/F w esophageal stenosis, recent dilation, history of PE on Eliquis , type 2 diabetes mellitus, hypertension, DJD, chronic anemia presented to the ED with shortness of breath and leg swelling over the last few weeks. -Patient reports having her esophagus stretched in November, felt better for a few days and then shortly thereafter started having difficulty swallowing again, reports very poor p.o. intake and failure to thrive as a result.  Few weeks ago developed leg swelling and some shortness of breath.  In the ED mildly tachycardic, labs sodium 1333, creatinine 1.03, hemoglobin 9, proBNP 1157, troponin 33, 28 chest x-ray with no active disease. - Repeat hemoglobin dropped to 7, transfuse PRBC, stool with blood - GI consulting, esophagram with severe dysmotility  Hospital Course:   Acute on chronic diastolic heart failure - Last TTE on 08/2023 shows EF 55-60%, Moderate LVH, and G1DD  -Likely exacerbated by anemia - Improving with diuresis, 5 L negative, appears close to euvolemic, now switched to  oral Lasix , continue Toprol , GDMT limited by soft BP, consider SGLT2i at follow-up if blood pressure tolerates - [facility] in 2 to 3 weeks, referral sent   Acute on chronic anemia -Hemoglobin 7 on admission, normocytic, transfused 1 unit PRBC - Anemia panel not calculated, ferritin was elevated, serum iron was normal - Reports having a colonoscopy in the last few months which was allegedly unremarkable, through [facility], dark blood noted on admission -Recent EGD and dilation, colonoscopy with polyps removed, GI consulted, recommended conservative management, did not did not think she was having active bleeding at this time - On Eliquis  with history of PE, continued -Hemoglobin now stable in the 9-10 range   Esophageal stenosis Esophageal dysmotility - Noted on endoscopy 11/20/2023, sp dilation - Biopsy done noted candidiasis then, negative for malignancy - History of intermittent worsening dysphagia for over a month, GI following, esophagram with moderate to severe dysmotility, aspiration precautions, continue PPI -For the most part tolerating her diet, GI recommended follow-up with [facility]   T2DM - Well-controlled, last A1c 4.8% 5 months ago   # Hx of pulmonary embolism - Continue Eliquis    # AAA - CTA chest in Nov 2025 showed a 4.1 cm ascending aortic aneurysm - Needs annual imaging follow-up    # PAD - Continue cilostazol    # HLD -Continue pravastatin    # GERD - Continue PPI  Discharge Exam: Vitals:   01/18/24 0726 01/18/24 0858  BP: 90/63 103/67  Pulse: (!) 113 78  Resp: 16   Temp: 97.8 F (36.6 C)   SpO2: 100%    Gen: Awake, Alert, Oriented X 3,  HEENT:  no JVD Lungs: Good air movement bilaterally, CTAB CVS: S1S2/RRR Abd: soft, Non tender, non distended, BS present Extremities: Trace edema Skin: no new rashes on exposed skin   Discharge Instructions   Discharge Instructions     Ambulatory referral to Cardiology   Complete by: As  directed    Increase activity slowly   Complete by: As directed       Allergies as of 01/18/2024   No Known Allergies      Medication List     TAKE these medications    apixaban  5 MG Tabs tablet Commonly known as: ELIQUIS  Take 5 mg by mouth 2 (two) times daily.   cilostazol  50 MG tablet Commonly known as: PLETAL  Take 50 mg by mouth 2 (two) times daily.   fesoterodine  4 MG Tb24 tablet Commonly known as: TOVIAZ  Take 4 mg by mouth daily.   metoprolol  tartrate 25 MG tablet Commonly known as: LOPRESSOR  Take 0.5 tablets (12.5 mg total) by mouth 2 (two) times daily.   omeprazole  40 MG capsule Commonly known as: PRILOSEC Take 1 capsule (40 mg total) by mouth daily. What changed:  medication strength how much to take   Potassium Chloride  ER 20 MEQ Tbcr Take 1 tablet by mouth daily.   pravastatin  40 MG tablet Commonly known as: PRAVACHOL  Take 40 mg by mouth daily in the afternoon.   torsemide  20 MG tablet Commonly known as: DEMADEX  Take 1 tablet (20 mg total) by mouth daily.       Allergies[1]  Follow-up Information     [facility]) Follow up.   Specialty: Home Health Services Why: Home health has been arranged. they will contact you to schedule an appointment within 48 hours postdischarge. Contact information: [address] [place]  [phone]                 The results of significant diagnostics from this hospitalization (including imaging, microbiology, ancillary and laboratory) are listed below for reference.    Significant Diagnostic Studies: DG ESOPHAGUS W SINGLE CM (SOL OR THIN BA) Result Date: 01/16/2024 CLINICAL DATA:  [id number] Dysphagia 356272 Patient with a history of dysphagia with prior esophageal dilation November 2025. EXAM: ESOPHAGUS/BARIUM SWALLOW/TABLET STUDY TECHNIQUE: Single contrast examination was performed using thin liquid barium. This exam was performed by Warren Dais, NP,  and was supervised and interpreted by Dr. Hughes. FLUOROSCOPY: Radiation Exposure Index (as provided by the fluoroscopic device): 11.70 mGy Kerma COMPARISON:  DG Esophagus 09/18/23. FINDINGS: Swallowing: Appears normal. No vestibular penetration or aspiration seen. Pharynx: Unremarkable. Esophagus: Normal appearance.  Prominent aortic notch. Esophageal motility: Moderate to severe dysmotility. Poor primary peristalsis with intermittent tertiary contractions. Delay in transit of barium through the esophagus and into the stomach Hiatal Hernia: None. Gastroesophageal reflux: None visualized. Ingested 13mm barium tablet: Passed normally Other: None. IMPRESSION: Limited study due to patient's functional/clinical status. Moderate-to-severe esophageal dysmotility with poor peristalsis. Electronically Signed   By: Thom Hughes M.D.   On: 01/16/2024 14:55   ECHOCARDIOGRAM COMPLETE Result Date: 01/15/2024    ECHOCARDIOGRAM REPORT   Patient Name:   Angie Carr Date of Exam: 01/15/2024 Medical Rec #:  [id number]          Height:       64.0 in Accession #:    [id number]         Weight:       171.7 lb Date of Birth:  Jun 09, 1955  BSA:          1.834 m Patient Age:    68 years           BP:           111/75 mmHg Patient Gender: F                  HR:           93 bpm. Exam Location:  Inpatient Procedure: 2D Echo, Cardiac Doppler and Color Doppler (Both Spectral and Color            Flow Doppler were utilized during procedure). Indications:    Dyspnea  History:        Patient has prior history of Echocardiogram examinations, most                 recent 08/30/2023. CHF, Signs/Symptoms:Dyspnea, Hypotension and                 Dizziness/Lightheadedness; Risk Factors:Dyslipidemia, Diabetes,                 Hypertension and Current Smoker.  Sonographer:    Juliene Rucks Referring Phys: [id number] PROSPER M AMPONSAH  Sonographer Comments: Technically difficult study due to poor echo windows, suboptimal apical window, suboptimal  parasternal window, suboptimal subcostal window and patient is obese. IMPRESSIONS  1. Left ventricular ejection fraction, by estimation, is 60 to 65%. The left ventricle has normal function. The left ventricle has no regional wall motion abnormalities. There is mild concentric left ventricular hypertrophy. Left ventricular diastolic parameters are consistent with Grade I diastolic dysfunction (impaired relaxation).  2. Right ventricular systolic function is normal. The right ventricular size is normal.  3. A small pericardial effusion is present.  4. The mitral valve is normal in structure. No evidence of mitral valve regurgitation. No evidence of mitral stenosis.  5. The aortic valve has an indeterminant number of cusps. Aortic valve regurgitation is trivial. No aortic stenosis is present.  6. The inferior vena cava is normal in size with greater than 50% respiratory variability, suggesting right atrial pressure of 3 mmHg. FINDINGS  Left Ventricle: Left ventricular ejection fraction, by estimation, is 60 to 65%. The left ventricle has normal function. The left ventricle has no regional wall motion abnormalities. The left ventricular internal cavity size was normal in size. There is  mild concentric left ventricular hypertrophy. Left ventricular diastolic parameters are consistent with Grade I diastolic dysfunction (impaired relaxation). Right Ventricle: The right ventricular size is normal. Right ventricular systolic function is normal. Left Atrium: Left atrial size was normal in size. Right Atrium: Right atrial size was normal in size. Pericardium: A small pericardial effusion is present. Mitral Valve: The mitral valve is normal in structure. Mild mitral annular calcification. No evidence of mitral valve regurgitation. No evidence of mitral valve stenosis. Tricuspid Valve: The tricuspid valve is normal in structure. Tricuspid valve regurgitation is trivial. No evidence of tricuspid stenosis. Aortic Valve: The  aortic valve has an indeterminant number of cusps. Aortic valve regurgitation is trivial. No aortic stenosis is present. Pulmonic Valve: The pulmonic valve was not well visualized. Pulmonic valve regurgitation is not visualized. No evidence of pulmonic stenosis. Aorta: The aortic root is normal in size and structure. Venous: The inferior vena cava is normal in size with greater than 50% respiratory variability, suggesting right atrial pressure of 3 mmHg. IAS/Shunts: The interatrial septum was not well visualized.  LEFT VENTRICLE PLAX 2D LVIDd:  4.60 cm   Diastology LVIDs:         3.30 cm   LV e' medial:    7.51 cm/s LV PW:         1.30 cm   LV E/e' medial:  12.0 LV IVS:        1.10 cm   LV e' lateral:   7.94 cm/s LVOT diam:     1.70 cm   LV E/e' lateral: 11.3 LV SV:         41 LV SV Index:   22 LVOT Area:     2.27 cm  RIGHT VENTRICLE TAPSE (M-mode): 1.9 cm LEFT ATRIUM         Index LA diam:    2.30 cm 1.25 cm/m  AORTIC VALVE LVOT Vmax:   121.00 cm/s LVOT Vmean:  82.300 cm/s LVOT VTI:    0.179 m  AORTA Ao Root diam: 3.60 cm MITRAL VALVE MV Area (PHT): 4.68 cm     SHUNTS MV Decel Time: 162 msec     Systemic VTI:  0.18 m MV E velocity: 90.10 cm/s   Systemic Diam: 1.70 cm MV A velocity: 137.00 cm/s MV E/A ratio:  0.66 Redell Shallow MD Electronically signed by Redell Shallow MD Signature Date/Time: 01/15/2024/1:19:43 PM    Final    DG Chest 2 View Result Date: 01/14/2024 CLINICAL DATA:  Shortness of breath. EXAM: CHEST - 2 VIEW COMPARISON:  CTA chest dated 11/13/2023. Chest radiograph dated 09/16/2023. FINDINGS: The heart size and mediastinal contours are unchanged. Aortic atherosclerosis. No overt pulmonary edema. No focal consolidation, pleural effusion, or pneumothorax. Degenerative changes of the thoracic spine and bilateral glenohumeral joints. IMPRESSION: No acute cardiopulmonary findings. Electronically Signed   By: Harrietta Sherry M.D.   On: 01/14/2024 14:19    Microbiology: Recent Results (from  the past 240 hours)  MRSA Next Gen by PCR, Nasal     Status: Abnormal   Collection Time: 01/15/24 11:40 PM   Specimen: Nasal Mucosa; Nasal Swab  Result Value Ref Range Status   MRSA by PCR Next Gen DETECTED (A) NOT DETECTED Final    Comment: CRITICAL RESULT CALLED TO, READ BACK BY AND VERIFIED WITH:  MEAGAN 01/16/2024 BY DD @ [phone] (NOTE) The GeneXpert MRSA Assay (FDA approved for NASAL specimens only), is one component of a comprehensive MRSA colonization surveillance program. It is not intended to diagnose MRSA infection nor to guide or monitor treatment for MRSA infections. Test performance is not FDA approved in patients less than 80 years old. Performed at [facility], [address] [place], [place] [address]      Labs: Basic Metabolic Panel: Recent Labs  Lab 01/14/24 1327 01/15/24 0315 01/16/24 0236 01/17/24 0229 01/18/24 0141  NA 133* 136 135 136 140  K 4.6 3.5 3.1* 3.7 3.8  CL 104 106 103 102 104  CO2 15* 19* 20* 22 23  GLUCOSE 61* 59* 75 72 71  BUN 8 10 8 10 14   CREATININE 1.03* 1.00 0.99 1.10* 1.27*  CALCIUM  8.2* 7.4* 7.5* 7.5* 7.1*  MG  --  1.6*  --   --   --    Liver Function Tests: Recent Labs  Lab 01/14/24 1327 01/15/24 0315  AST 45* 32  ALT 50* 38  ALKPHOS 95 71  BILITOT 1.2 0.8  PROT 5.7* 4.2*  ALBUMIN 2.9* 2.3*   No results for input(s): LIPASE, AMYLASE in the last 168 hours. No results for input(s): AMMONIA in the last 168  hours. CBC: Recent Labs  Lab 01/14/24 1327 01/15/24 0315 01/15/24 1539 01/16/24 0236 01/17/24 0229 01/18/24 0141  WBC 3.8* 4.0  --  4.8 4.8 4.7  NEUTROABS 2.0  --   --   --   --   --   HGB 9.0* 7.0* 9.1* 10.0* 10.6* 10.0*  HCT 27.0* 20.3* 25.9* 27.4* 29.2* 28.1*  MCV 102.7* 98.5  --  92.3 92.1 94.3  PLT 247 195  --  216 210 206   Cardiac Enzymes: No results for input(s): CKTOTAL, CKMB, CKMBINDEX, TROPONINI in the last 168 hours. BNP: BNP (last 3 results) No results for input(s): BNP in  the last 8760 hours.  ProBNP (last 3 results) Recent Labs    01/14/24 1327  PROBNP 1,157.0*    CBG: Recent Labs  Lab 01/17/24 0608 01/17/24 1131 01/17/24 1627 01/17/24 2112 01/18/24 0618  GLUCAP 70 78 86 77 74       Signed:  Sigurd Pac MD.  [facility] 01/18/2024, 10:08 AM        [1] No Known Allergies

## 2024-01-20 ENCOUNTER — Telehealth: Payer: Self-pay

## 2024-01-20 NOTE — Transitions of Care (Post Inpatient/ED Visit) (Signed)
 "  01/20/2024  Name: Angie Carr MRN: [id number] DOB: 02-01-1955  Today's TOC FU Call Status: Today's TOC FU Call Status:: Successful TOC FU Call Completed TOC FU Call Complete Date: 01/20/24  Patient's Name and Date of Birth confirmed. Name, DOB  Transition Care Management Follow-up Telephone Call Date of Discharge: 01/18/24 Discharge Facility: [facility] Type of Discharge: Inpatient Admission Primary Inpatient Discharge Diagnosis:: Acute on chronic diastolic (congestive) heart failure ( How have you been since you were released from the hospital?: Better (I feel a little better) Any questions or concerns?: No  Items Reviewed: Did you receive and understand the discharge instructions provided?: Yes Medications obtained,verified, and reconciled?: Yes (Medications Reviewed) Any new allergies since your discharge?: No Dietary orders reviewed?: Yes Type of Diet Ordered:: Heart healthy low sodium Do you have support at home?: Yes Name of Support/Comfort Primary Source: son Angie Carr  Medications Reviewed Today: Medications Reviewed Today     Reviewed by Eilleen Richerd GRADE, RN (Registered Nurse) on 01/20/24 at 1555  Med List Status: <None>   Medication Order Taking? Sig Documenting Provider Last Dose Status Informant  apixaban  (ELIQUIS ) 5 MG TABS tablet [id number] Yes Take 5 mg by mouth 2 (two) times daily. [provider]  Active Self, Pharmacy Records  cilostazol  (PLETAL ) 50 MG tablet [id number] Yes Take 50 mg by mouth 2 (two) times daily. [provider]  Active Self, Pharmacy Records           Med Note [facility], DONETA GORMAN Repress Sep 15, 2023 11:13 PM)    fesoterodine  (TOVIAZ ) 4 MG TB24 tablet [id number] Yes Take 4 mg by mouth daily. [provider]  Active Self, Pharmacy Records  metoprolol  tartrate (LOPRESSOR ) 25 MG tablet [id number] Yes Take 0.5 tablets (12.5 mg total) by mouth 2 (two) times daily. Fairy Frames, MD  Active   pantoprazole   (PROTONIX ) 40 MG tablet [id number] Yes Take 1 tablet (40 mg total) by mouth daily. Fairy Frames, MD  Active   Potassium Chloride  ER 20 MEQ TBCR [id number] Yes Take 1 tablet by mouth daily. [provider]  Active Self, Pharmacy Records  pravastatin  (PRAVACHOL ) 40 MG tablet [id number] Yes Take 40 mg by mouth daily in the afternoon. [provider]  Active Self, Pharmacy Records  torsemide  (DEMADEX ) 20 MG tablet [id number] Yes Take 1 tablet (20 mg total) by mouth daily. Fairy Frames, MD  Active   Med List Note Leobardo Garre, CPhT 01/14/24 2320): Pt. Only taking medications listed below. Dr. Benjamine stopped/holding all others.             Home Care and Equipment/Supplies: Were Home Health Services Ordered?: Yes Name of Home Health Agency:: [facility] Has Agency set up a time to come to your home?: No EMR reviewed for Home Health Orders: Orders present/patient has not received call (refer to CM for follow-up) (Was previously active with patient) Any new equipment or medical supplies ordered?: No  Functional Questionnaire: Do you need assistance with bathing/showering or dressing?: No Do you need assistance with meal preparation?: Yes (sometimes) Do you need assistance with eating?: No Do you have difficulty maintaining continence: No Do you need assistance with getting out of bed/getting out of a chair/moving?: No Do you have difficulty managing or taking your medications?: No  Follow up appointments reviewed: PCP Follow-up appointment confirmed?: No Specialist Hospital Follow-up appointment confirmed?: Yes Date of Specialist follow-up appointment?: 01/24/24 Follow-Up Specialty Provider:: Cardiology Do you need transportation to your follow-up appointment?: Yes Transportation Need  Intervention Addressed By::  (See if [facility] can help with wheelchair if possible) Do you understand care options if your condition(s) worsen?: Yes-patient verbalized understanding  SDOH  Interventions Today    Flowsheet Row Most Recent Value  SDOH Interventions   Food Insecurity Interventions Intervention Not Indicated  Housing Interventions Intervention Not Indicated  Transportation Interventions Intervention Not Indicated, [facility]  [Patient contact within 24 hours of appointments]  Utilities Interventions Intervention Not Indicated    Goals Addressed             This Visit's Progress    VBCI Transitions of Care (TOC) Care Plan       Problems:  Recent Hospitalization for treatment of CHF Equipment/DME barrier patient desires wheelchair to go to appointments due to shortness of breath, patient has [facility] following with her, patient to ask HHPT for need, reviewed inpatient PT notes, patient encouraged to use walker and No Hospital Follow Up Provider appointment offered appointment assistance however patient desires to call her PCP and states she wants to see what the cardiologist says on Friday appointment. Patient uses [facility] for appointments and to give them 24 hour notice for appointments.  Discuss importance of daily weights and patient does not have a scale and encourage to get a readable scale. Patient verbalized understanding, will follow.  Goal:  Over the next 30 days, the patient will not experience hospital readmission  Interventions:  Transitions of Care: Doctor Visits  - discussed the importance of doctor visits  Heart Failure Interventions: Provided education on low sodium diet Assessed need for readable accurate scales in home Discussed importance of daily weight and advised patient to weigh and record daily Reviewed role of diuretics in prevention of fluid overload and management of heart failure; Discussed the importance of keeping all appointments with provider Advised patient to discuss medications changes reviewed on AVS today with provider Screening for signs and symptoms of depression related to chronic  disease state  Assessed social determinant of health barriers   Patient Self Care Activities:  Attend all scheduled provider appointments Call pharmacy for medication refills 3-7 days in advance of running out of medications Call provider office for new concerns or questions  Notify RN Care Manager of TOC call rescheduling needs Participate in Transition of Care Program/Attend TOC scheduled calls Take medications as prescribed    Plan:  Follow up with provider re: changes in Metoprolol  medications in the setting of HF The patient has been provided with contact information for the care management team and has been advised to call with any health related questions or concerns. Patient to contact this writer if barriers for transportation to follow up appointment to cardiology. Discussed how important for cardiology appointment.  01/20/24 Discussed and offered 30 day TOC program.  Patient agrees to restart 30 day TOC program.  The patient has been provided with contact information for the care management team and has been advised to call with any health -related questions or concerns.  The patient verbalized understanding with current plan of care.  The patient is directed to their insurance card regarding availability of benefits coverage.  Will schedule for next week follow up call.           Richerd Fish, RN, BSN, CCM [facility], [facility] Management Coordinator Direct Dial: [phone]        "

## 2024-01-20 NOTE — Patient Instructions (Signed)
 Visit Information  Thank you for taking time to visit with me today. Please don't hesitate to contact me if I can be of assistance to you before our next scheduled telephone appointment.  Our next appointment is by telephone on 01/28/24 at 11:00 am  Following is a copy of your care plan:   Goals Addressed             This Visit's Progress    VBCI Transitions of Care (TOC) Care Plan       Problems:  Recent Hospitalization for treatment of CHF Equipment/DME barrier patient desires wheelchair to go to appointments due to shortness of breath, patient has [facility] following with her, patient to ask HHPT for need, reviewed inpatient PT notes, patient encouraged to use walker and No Hospital Follow Up Provider appointment offered appointment assistance however patient desires to call her PCP and states she wants to see what the cardiologist says on Friday appointment. Patient uses [facility] for appointments and to give them 24 hour notice for appointments.  Discuss importance of daily weights and patient does not have a scale and encourage to get a readable scale. Patient verbalized understanding, will follow.  Goal:  Over the next 30 days, the patient will not experience hospital readmission  Interventions:  Transitions of Care: Doctor Visits  - discussed the importance of doctor visits  Heart Failure Interventions: Provided education on low sodium diet Assessed need for readable accurate scales in home Discussed importance of daily weight and advised patient to weigh and record daily Reviewed role of diuretics in prevention of fluid overload and management of heart failure; Discussed the importance of keeping all appointments with provider Advised patient to discuss medications changes reviewed on AVS today with provider Screening for signs and symptoms of depression related to chronic disease state  Assessed social determinant of health barriers   Patient Self Care Activities:  Attend  all scheduled provider appointments Call pharmacy for medication refills 3-7 days in advance of running out of medications Call provider office for new concerns or questions  Notify RN Care Manager of TOC call rescheduling needs Participate in Transition of Care Program/Attend TOC scheduled calls Take medications as prescribed    Plan:  Follow up with provider re: changes in Metoprolol  medications in the setting of HF The patient has been provided with contact information for the care management team and has been advised to call with any health related questions or concerns. Patient to contact this writer if barriers for transportation to follow up appointment to cardiology. Discussed how important for cardiology appointment.  01/20/24 Discussed and offered 30 day TOC program.  Patient agrees to restart 30 day TOC program.  The patient has been provided with contact information for the care management team and has been advised to call with any health -related questions or concerns.  The patient verbalized understanding with current plan of care.  The patient is directed to their insurance card regarding availability of benefits coverage.  Will schedule for next week follow up call.          Patient verbalizes understanding of instructions and care plan provided today and agrees to view in MyChart. Active MyChart status and patient understanding of how to access instructions and care plan via MyChart confirmed with patient.     The patient has been provided with contact information for the care management team and has been advised to call with any health related questions or concerns.   Please call the care  guide team at [phone] if you need to cancel or reschedule your appointment.   Please call the [place]  National Suicide Prevention Lifeline: (301)339-7929 or TTY: 323-773-3416 TTY (207)497-2579) to talk to a trained counselor go to [facility] [address] [place] [phone]) call 911 if you are experiencing a Mental Health or Behavioral Health Crisis or need someone to talk to.  Richerd Fish, RN, BSN, CCM [facility], [facility] Management Coordinator Direct Dial: [phone]

## 2024-01-22 ENCOUNTER — Telehealth: Payer: Self-pay | Admitting: *Deleted

## 2024-01-22 NOTE — Progress Notes (Unsigned)
 " Cardiology Office Note:    Date:  01/22/2024   ID:  Angie Carr, DOB 01-Jan-1956, MRN [id number]  PCP:  Benjamine Aland, MD  Cardiologist:  None  Electrophysiologist:  None   Referring MD: Fairy Frames, MD   No chief complaint on file. ***  History of Present Illness:    Angie Carr is a 69 y.o. female with a hx of PE on Eliquis , T2DM, hypertension, esophageal stenosis, chronic anemia who presents as a hospital follow-up for initial evaluation of acute on chronic diastolic heart failure.  She was admitted 01/14/2024 with worsening shortness of breath and edema.  Found to be anemic, with hemoglobin down to 7.0.  She was volume overloaded, was started on IV Lasix .  She diuresed 5 L net negative, was switched to oral Lasix .  Echocardiogram 01/15/2024 showed EF 60 to 65%, normal RV function, small pericardial effusion, no significant valvular disease.  Past Medical History:  Diagnosis Date   Anemia    Anxiety    Asthma    pt denies   Diabetes mellitus without complication (HCC)    DJD (degenerative joint disease)    lower back   GERD (gastroesophageal reflux disease)    Headache    miagraines, none in years   Hypertension    LBP (low back pain)    Morbid obesity (HCC)    Neuromuscular disorder (HCC)    feet tingle and her balance is sometimes a problem   Primary osteoarthritis of both knees    Smoker     Past Surgical History:  Procedure Laterality Date   ABDOMINAL HYSTERECTOMY     complete   CHOLECYSTECTOMY     laparascopic   COLONOSCOPY N/A 02/11/2014   Procedure: COLONOSCOPY;  Surgeon: Lamar Donnald GAILS, MD;  Location: [facility] END[facility];  Service: Endoscopy;  Laterality: N/A;   ESOPHAGOGASTRODUODENOSCOPY N/A 11/20/2023   Procedure: EGD (ESOPHAGOGASTRODUODENOSCOPY);  Surgeon: San Sandor GAILS, DO;  Location: [facility] ENDOSCOPY;  Service: Gastroenterology;  Laterality: N/A;  with possible dilation   HERNIA REPAIR     ventral on left side.   TOTAL HIP ARTHROPLASTY Left  07/24/2023   Procedure: ARTHROPLASTY, HIP, TOTAL,POSTERIOR APPROACH;  Surgeon: Edna Toribio LABOR, MD;  Location: [facility] ORS;  Service: Orthopedics;  Laterality: Left;   TUBAL LIGATION      Current Medications: Active Medications[1]   Allergies:   Patient has no known allergies.   Social History   Socioeconomic History   Marital status: Divorced    Spouse name: Not on file   Number of children: Not on file   Years of education: Not on file   Highest education level: Not on file  Occupational History   Not on file  Tobacco Use   Smoking status: Some Days    Current packs/day: 0.50    Types: Cigarettes   Smokeless tobacco: Never  Vaping Use   Vaping status: Never Used  Substance and Sexual Activity   Alcohol  use: No   Drug use: Yes    Types: Marijuana   Sexual activity: Not Currently  Other Topics Concern   Not on file  Social History Narrative   Not on file   Social Drivers of Health   Tobacco Use: High Risk (01/14/2024)   Patient History    Smoking Tobacco Use: Some Days    Smokeless Tobacco Use: Never    Passive Exposure: Not on file  Financial Resource Strain: Low Risk (11/04/2023)   Overall Financial Resource Strain (CARDIA)    Difficulty of  Paying Living Expenses: Not very hard  Food Insecurity: No Food Insecurity (01/20/2024)   Epic    Worried About Programme Researcher, Broadcasting/film/video in the Last Year: Never true    Ran Out of Food in the Last Year: Never true  Transportation Needs: No Transportation Needs (01/20/2024)   Epic    Lack of Transportation (Medical): No    Lack of Transportation (Non-Medical): No  Physical Activity: Inactive (11/04/2023)   Exercise Vital Sign    Days of Exercise per Week: 0 days    Minutes of Exercise per Session: 0 min  Stress: No Stress Concern Present (11/04/2023)   [employer] of Occupational Health - Occupational Stress Questionnaire    Feeling of Stress: Only a little  Social Connections: Moderately Integrated (01/15/2024)   Social  Connection and Isolation Panel    Frequency of Communication with Friends and Family: More than three times a week    Frequency of Social Gatherings with Friends and Family: More than three times a week    Attends Religious Services: More than 4 times per year    Active Member of Clubs or Organizations: Yes    Attends Banker Meetings: More than 4 times per year    Marital Status: Divorced  Depression (PHQ2-9): Low Risk (01/20/2024)   Depression (PHQ2-9)    PHQ-2 Score: 0  Alcohol  Screen: Not on file  Housing: Low Risk (01/20/2024)   Epic    Unable to Pay for Housing in the Last Year: No    Number of Times Moved in the Last Year: 0    Homeless in the Last Year: No  Utilities: Not At Risk (01/20/2024)   Epic    Threatened with loss of utilities: No  Health Literacy: Adequate Health Literacy (11/04/2023)   B1300 Health Literacy    Frequency of need for help with medical instructions: Never     Family History: The patient's ***family history includes Hypertension in an other family member; Stroke in an other family member.  ROS:   Please see the history of present illness.    *** All other systems reviewed and are negative.  EKGs/Labs/Other Studies Reviewed:    The following studies were reviewed today: ***  EKG:  EKG is *** ordered today.  The ekg ordered today demonstrates ***  Recent Labs: 01/14/2024: Pro Brain Natriuretic Peptide 1,157.0 01/15/2024: ALT 38; Magnesium  1.6 01/17/2024: TSH 3.520 01/18/2024: BUN 14; Creatinine, Ser 1.27; Hemoglobin 10.0; Platelets 206; Potassium 3.8; Sodium 140  Recent Lipid Panel    Component Value Date/Time   CHOL 141 11/04/2012 1142   TRIG 193 (H) 11/04/2012 1142   HDL 35 (L) 11/04/2012 1142   CHOLHDL 4.0 11/04/2012 1142   VLDL 39 11/04/2012 1142   LDLCALC 67 11/04/2012 1142    Physical Exam:    VS:  There were no vitals taken for this visit.    Wt Readings from Last 3 Encounters:  01/18/24 142 lb 13.7 oz (64.8 kg)   11/20/23 171 lb 8.3 oz (77.8 kg)  09/27/23 196 lb (88.9 kg)     GEN: *** Well nourished, well developed in no acute distress HEENT: Normal NECK: No JVD; No carotid bruits LYMPHATICS: No lymphadenopathy CARDIAC: ***RRR, no murmurs, rubs, gallops RESPIRATORY:  Clear to auscultation without rales, wheezing or rhonchi  ABDOMEN: Soft, non-tender, non-distended MUSCULOSKELETAL:  No edema; No deformity  SKIN: Warm and dry NEUROLOGIC:  Alert and oriented x 3 PSYCHIATRIC:  Normal affect   ASSESSMENT:    No  diagnosis found. PLAN:    Chronic diastolic heart failure: She was admitted 01/14/2024 with worsening shortness of breath and edema.  Found to be anemic, with hemoglobin down to 7.0.  She was volume overloaded, was started on IV Lasix .  She diuresed 5 L net negative, was switched to oral Lasix .  Echocardiogram 01/15/2024 showed EF 60 to 65%, normal RV function, small pericardial effusion, no significant valvular disease. - Continue torsemide  20 mg daily.  Check BMET, magnesium   History of PE: On Eliquis   Anemia: Hemoglobin 7 on admission 01/2024, GI consulted, did not suspect active bleeding.  She is on Eliquis  as above  T2DM:  PAD: ABIs 09/2023 were normal on right (1.0), moderately reduced on left (0.70).  On cilostazol .  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  Concerns regarding medicines are outlined above.  No orders of the defined types were placed in this encounter.  No orders of the defined types were placed in this encounter.   There are no Patient Instructions on file for this visit.   Signed, Lonni LITTIE Nanas, MD  01/22/2024 10:25 PM    [facility]    [1]  No outpatient medications have been  marked as taking for the 01/24/24 encounter (Appointment) with Nanas Lonni LITTIE, MD.   "

## 2024-01-24 ENCOUNTER — Ambulatory Visit: Attending: Cardiology | Admitting: Cardiology

## 2024-01-28 ENCOUNTER — Other Ambulatory Visit: Payer: Self-pay

## 2024-01-28 ENCOUNTER — Telehealth: Payer: Self-pay

## 2024-01-28 NOTE — Transitions of Care (Post Inpatient/ED Visit) (Signed)
 " Transition of Care week 2  Visit Note  01/28/2024  Name: Angie Carr MRN: [id number]          DOB: 01-26-1955  Situation: Patient enrolled in [facility] 30-day program. Visit completed with patient by telephone.   Background:   Initial Transition Care Management Follow-up Telephone Call Discharge Date and Diagnosis: 01/18/24, Acute on chronic diastolic (congestive) heart failure (   Past Medical History:  Diagnosis Date   Anemia    Anxiety    Asthma    pt denies   Diabetes mellitus without complication (HCC)    DJD (degenerative joint disease)    lower back   GERD (gastroesophageal reflux disease)    Headache    miagraines, none in years   Hypertension    LBP (low back pain)    Morbid obesity (HCC)    Neuromuscular disorder (HCC)    feet tingle and her balance is sometimes a problem   Primary osteoarthritis of both knees    Smoker     Assessment: Patient Reported Symptoms: Cognitive Cognitive Status: Able to follow simple commands, Alert and oriented to person, place, and time, Normal speech and language skills Cognitive/Intellectual Conditions Management [RPT]: Other Other: getting dates mixed up and felt she was concentrating on the weather and forgot cardiology appointment      Neurological Neurological Review of Symptoms: No symptoms reported Neurological Self-Management Outcome: 4 (good)  HEENT HEENT Symptoms Reported: No symptoms reported HEENT Management Strategies: Routine screening    Cardiovascular Cardiovascular Symptoms Reported: Fatigue Is patient checking Blood Pressure at home?: Yes Patient's Recent BP reading at home: 104/60 Cardiovascular Management Strategies: Medication therapy  Respiratory Respiratory Symptoms Reported: Productive cough    Endocrine Endocrine Symptoms Reported: Shakiness (per grandson, Jaquan Hirschhorn, with patient's permission) Is patient diabetic?: Yes Is patient checking blood sugars at home?: No Endocrine  Self-Management Outcome: 3 (uncertain) Endocrine Comment: patient still with dificulty keeping food down and swallowing per Jaquan - not sure how much she is eating, patient states she is tolerating small bites of food without difficulty swallowing or keeping it down, if it's small amounts.  Gastrointestinal Gastrointestinal Symptoms Reported: Nausea Other Gastrointestinal Symptoms: drinking 1/2 can to a can Additional Gastrointestinal Details: eating small meals, nausea if I get too much at a time Gastrointestinal Self-Management Outcome: 3 (uncertain) Gastrointestinal Comment: Patient is still not eating a full meal    Genitourinary Genitourinary Symptoms Reported: Incontinence Genitourinary Management Strategies: Incontinence garment/pad Genitourinary Self-Management Outcome: 3 (uncertain)  Integumentary Integumentary Symptoms Reported: No symptoms reported    Musculoskeletal Musculoskelatal Symptoms Reviewed: Difficulty walking, Weakness Additional Musculoskeletal Details: Patient states HHPT is active Musculoskeletal Management Strategies: Medication therapy, Routine screening Musculoskeletal Self-Management Outcome: 2 (bad) Musculoskeletal Comment: Patient state that she did exercises in the bed at last [facility] visit and she forgot to ask about getting a wheelchair Falls in the past year?: Yes Number of falls in past year: 2 or more Was there an injury with Fall?: Yes Fall Risk Category Calculator: 3 Patient Fall Risk Level: High Fall Risk    Psychosocial Psychosocial Symptoms Reported: No symptoms reported (Grandson expressing concern for depression but patient denies) Additional Psychological Details: I feel fine, I've been just relaxing more since it snowed Behavioral Health Self-Management Outcome: 3 (uncertain)       Today's Vitals   01/28/24 1106  BP: 104/60   Pain Scale: 0-10 Pain Score: 0-No pain  Medications Reviewed Today     Reviewed by Eilleen Richerd GRADE, RN  (  Registered Nurse) on 01/28/24 at 1110  Med List Status: <None>   Medication Order Taking? Sig Documenting Provider Last Dose Status Informant  apixaban  (ELIQUIS ) 5 MG TABS tablet [id number] Yes Take 5 mg by mouth 2 (two) times daily. [provider]  Active Self, Pharmacy Records  cilostazol  (PLETAL ) 50 MG tablet [id number] Yes Take 50 mg by mouth 2 (two) times daily. [provider]  Active Self, Pharmacy Records           Med Note [facility], DONETA GORMAN Repress Sep 15, 2023 11:13 PM)    fesoterodine  (TOVIAZ ) 4 MG TB24 tablet [id number] Yes Take 4 mg by mouth daily. [provider]  Active Self, Pharmacy Records  metoprolol  tartrate (LOPRESSOR ) 25 MG tablet [id number] Yes Take 0.5 tablets (12.5 mg total) by mouth 2 (two) times daily. Fairy Frames, MD  Active   pantoprazole  (PROTONIX ) 40 MG tablet [id number] Yes Take 1 tablet (40 mg total) by mouth daily. Fairy Frames, MD  Active   Potassium Chloride  ER 20 MEQ TBCR [id number] Yes Take 1 tablet by mouth daily. [provider]  Active Self, Pharmacy Records  pravastatin  (PRAVACHOL ) 40 MG tablet [id number] Yes Take 40 mg by mouth daily in the afternoon. [provider]  Active Self, Pharmacy Records  torsemide  (DEMADEX ) 20 MG tablet [id number] Yes Take 1 tablet (20 mg total) by mouth daily. Fairy Frames, MD  Active   Med List Note Leobardo Garre, CPhT 01/14/24 2320): Pt. Only taking medications listed below. Dr. Benjamine stopped/holding all others.            TOC Consent to [facility] Information   Following recent discharge, the patient has enrolled in the Transition of Care [facility]) Program and requested that post-discharge communication be directed to a designated support person. The patient has consented for the [facility] team to share information with the individual(s) named below to support care coordination, medication review, and follow-up planning.  Authorized Individual(s): - Name: Debi Cousin -  Relationship to Patient:  grandson - Contact Info: [phone]  Patient verbalized understanding of the consent and confirmed their request for the [facility] team to communicate with the above-named individual(s) for the duration of the [facility] program enrollment. Patient was informed that this consent may be revoked at any time.   Recommendation:   PCP Follow-up Home Health requests: 12:24 pm contacted [facility] to check for next home visit planned, she states they are awaiting orders from Dr. Benjamine to continue therapy last visit was 01/23/24 Continue Current Plan of Care  Follow Up Plan:   Telephone follow-up in 1 day for PCP for appointment needs. Called PCP Office and it was closed today 01/28/24 due to inclement weather when called.  Will follow up for possible appointment within the next week if possible, and regarding ongoing HH needs and DME (ie, wheelchair and possible new walker) Next telephone visit 02/05/24 10:00 AM  Richerd Fish, RN, BSN, CCM [facility], [facility] RN Care Manager Direct Dial: [phone]           "

## 2024-01-28 NOTE — Patient Instructions (Signed)
 Visit Information  Thank you for taking time to visit with me today. Please don't hesitate to contact me if I can be of assistance to you before our next scheduled telephone appointment.  Our next appointment is by telephone on 02/05/24 at 10:00 AM  Following is a copy of your care plan:   Goals Addressed             This Visit's Progress    VBCI Transitions of Care (TOC) Care Plan       Problems:  Recent Hospitalization for treatment of CHF Equipment/DME barrier patient desires wheelchair to go to appointments due to shortness of breath, patient has [facility] following with her, patient to ask HHPT for need, reviewed inpatient PT notes, patient encouraged to use walker and No Hospital Follow Up Provider appointment offered appointment assistance however patient desires to call her PCP and states she wants to see what the cardiologist says on Friday appointment. Patient uses [facility] for appointments and to give them 24 hour notice for appointments.  Discuss importance of daily weights and patient does not have a scale and encourage to get a readable scale. Patient verbalized understanding, will follow. 01/28/24 Missed 01/24/24 appointment with Cardiology was missed, patient states she was focused on the weather and simply forgot, I feel. Patient's grandson, Jaquan got on the call with patient stating, she is not telling you everything asked patient for permission of hearing grandson. Patient verbalized approval. Jaquan says patient is very weak and barely gotten out of the bed the past days he's been in the home. He states she is weak and had fallen with EMS having to get her up. He states that she says she wasn't hurt. Spoke back with patient and she said EMS checked me out, I feel it's my old walker  Goal:  Over the next 30 days, the patient will not experience hospital readmission  Interventions:  Transitions of Care: Doctor Visits  - discussed the importance of doctor visits  Heart  Failure Interventions: Provided education on low sodium diet Assessed need for readable accurate scales in home Discussed importance of daily weight and advised patient to weigh and record daily Reviewed role of diuretics in prevention of fluid overload and management of heart failure; Discussed the importance of keeping all appointments with provider Advised patient to discuss medications changes reviewed on AVS today with provider Screening for signs and symptoms of depression related to chronic disease state  Assessed social determinant of health barriers   Patient Self Care Activities:  Attend all scheduled provider appointments Call pharmacy for medication refills 3-7 days in advance of running out of medications Call provider office for new concerns or questions  Notify RN Care Manager of TOC call rescheduling needs Participate in Transition of Care Program/Attend TOC scheduled calls Take medications as prescribed    Plan:  Follow up with provider re: changes in Metoprolol  medications in the setting of HF The patient has been provided with contact information for the care management team and has been advised to call with any health related questions or concerns. Patient to contact this writer if barriers for transportation to follow up appointment to cardiology. Discussed how important for cardiology appointment.  01/20/24 Discussed and offered 30 day TOC program.  Patient agrees to restart 30 day TOC program.  The patient has been provided with contact information for the care management team and has been advised to call with any health -related questions or concerns.  The patient verbalized understanding  with current plan of care.  The patient is directed to their insurance card regarding availability of benefits coverage.  Will schedule for next week follow up call.          The patient verbalized understanding of instructions, educational materials, and care plan provided today  and DECLINED offer to receive copy of patient instructions, educational materials, and care plan.   The patient has been provided with contact information for the care management team and has been advised to call with any health related questions or concerns.   Please call the care guide team at [phone] if you need to cancel or reschedule your appointment.   Please call the [place]  National Suicide Prevention Lifeline: 253-396-0110 or TTY: (906)103-7169 TTY (405)004-8704) to talk to a trained counselor go to [facility] [address], [place] [phone]) call 911 if you are experiencing a Mental Health or Behavioral Health Crisis or need someone to talk to.  Richerd Fish, RN, BSN, CCM [facility], [facility] Health RN Care Manager Direct Dial: [phone]

## 2024-01-29 ENCOUNTER — Emergency Department (HOSPITAL_COMMUNITY)

## 2024-01-29 ENCOUNTER — Telehealth: Payer: Self-pay

## 2024-01-29 ENCOUNTER — Inpatient Hospital Stay (HOSPITAL_COMMUNITY): Admission: EM | Admit: 2024-01-29 | Source: Home / Self Care | Attending: Family Medicine | Admitting: Family Medicine

## 2024-01-29 DIAGNOSIS — E11649 Type 2 diabetes mellitus with hypoglycemia without coma: Secondary | ICD-10-CM | POA: Diagnosis present

## 2024-01-29 DIAGNOSIS — E782 Mixed hyperlipidemia: Secondary | ICD-10-CM | POA: Diagnosis present

## 2024-01-29 DIAGNOSIS — K222 Esophageal obstruction: Secondary | ICD-10-CM

## 2024-01-29 DIAGNOSIS — E876 Hypokalemia: Secondary | ICD-10-CM | POA: Diagnosis present

## 2024-01-29 DIAGNOSIS — R531 Weakness: Secondary | ICD-10-CM

## 2024-01-29 DIAGNOSIS — I5033 Acute on chronic diastolic (congestive) heart failure: Secondary | ICD-10-CM | POA: Diagnosis present

## 2024-01-29 DIAGNOSIS — L8990 Pressure ulcer of unspecified site, unspecified stage: Secondary | ICD-10-CM | POA: Insufficient documentation

## 2024-01-29 DIAGNOSIS — I959 Hypotension, unspecified: Secondary | ICD-10-CM | POA: Diagnosis present

## 2024-01-29 DIAGNOSIS — E872 Acidosis, unspecified: Secondary | ICD-10-CM

## 2024-01-29 DIAGNOSIS — I4891 Unspecified atrial fibrillation: Secondary | ICD-10-CM

## 2024-01-29 DIAGNOSIS — I739 Peripheral vascular disease, unspecified: Secondary | ICD-10-CM | POA: Diagnosis present

## 2024-01-29 DIAGNOSIS — N179 Acute kidney failure, unspecified: Secondary | ICD-10-CM | POA: Diagnosis present

## 2024-01-29 DIAGNOSIS — K219 Gastro-esophageal reflux disease without esophagitis: Secondary | ICD-10-CM | POA: Diagnosis present

## 2024-01-29 DIAGNOSIS — A419 Sepsis, unspecified organism: Principal | ICD-10-CM | POA: Diagnosis present

## 2024-01-29 DIAGNOSIS — I639 Cerebral infarction, unspecified: Secondary | ICD-10-CM | POA: Diagnosis present

## 2024-01-29 DIAGNOSIS — Z86711 Personal history of pulmonary embolism: Secondary | ICD-10-CM | POA: Diagnosis present

## 2024-01-29 DIAGNOSIS — Z8679 Personal history of other diseases of the circulatory system: Secondary | ICD-10-CM

## 2024-01-29 LAB — POC OCCULT BLOOD, ED: Fecal Occult Blood, POC: POSITIVE — AB

## 2024-01-29 MED ORDER — ACETAMINOPHEN 650 MG RE SUPP
650.0000 mg | Freq: Once | RECTAL | Status: AC
  Administered 2024-01-29: 650 mg via RECTAL
  Filled 2024-01-29: qty 1

## 2024-01-29 MED ORDER — LACTATED RINGERS IV SOLN: INTRAVENOUS | Status: AC

## 2024-01-29 MED ORDER — SODIUM CHLORIDE 0.9 % IV SOLN
2.0000 g | Freq: Once | INTRAVENOUS | Status: AC
  Administered 2024-01-30: 2 g via INTRAVENOUS
  Filled 2024-01-29: qty 12.5

## 2024-01-29 MED ORDER — METRONIDAZOLE 500 MG/100ML IV SOLN
500.0000 mg | Freq: Once | INTRAVENOUS | Status: AC
  Administered 2024-01-30: 500 mg via INTRAVENOUS
  Filled 2024-01-29: qty 100

## 2024-01-29 MED ORDER — VANCOMYCIN HCL IN DEXTROSE 1-5 GM/200ML-% IV SOLN
1000.0000 mg | Freq: Once | INTRAVENOUS | Status: AC
  Administered 2024-01-30: 1000 mg via INTRAVENOUS
  Filled 2024-01-29 (×2): qty 200

## 2024-01-29 NOTE — ED Provider Notes (Incomplete)
 " [place] EMERGENCY DEPARTMENT AT [facility] Provider Note   CSN: [id number] Arrival date & time: 01/29/24  2235     Patient presents with: Code Sepsis   Angie Carr is a 69 y.o. female.  Presents today via EMS for altered mental status and lethargy getting worse over the last 4 days.  Family said she was not communicating as well as she typically does and her left arm was swollen today.  Patient oriented to self and place but not time or situation.  Patient febrile with EMS.  {Add pertinent medical, surgical, social history, OB history to HPI:32947} HPI     Prior to Admission medications  Medication Sig Start Date End Date Taking? Authorizing Provider  apixaban  (ELIQUIS ) 5 MG TABS tablet Take 5 mg by mouth 2 (two) times daily.    [provider]  cilostazol  (PLETAL ) 50 MG tablet Take 50 mg by mouth 2 (two) times daily.    [provider]  fesoterodine  (TOVIAZ ) 4 MG TB24 tablet Take 4 mg by mouth daily.    [provider]  metoprolol  tartrate (LOPRESSOR ) 25 MG tablet Take 0.5 tablets (12.5 mg total) by mouth 2 (two) times daily. 01/18/24   Fairy Frames, MD  pantoprazole  (PROTONIX ) 40 MG tablet Take 1 tablet (40 mg total) by mouth daily. 01/18/24   Fairy Frames, MD  Potassium Chloride  ER 20 MEQ TBCR Take 1 tablet by mouth daily.    [provider]  pravastatin  (PRAVACHOL ) 40 MG tablet Take 40 mg by mouth daily in the afternoon.    [provider]  torsemide  (DEMADEX ) 20 MG tablet Take 1 tablet (20 mg total) by mouth daily. 01/18/24   Fairy Frames, MD    Allergies: Patient has no known allergies.    Review of Systems  Psychiatric/Behavioral:  Positive for confusion.     Updated Vital Signs BP 107/88 (BP Location: Left Arm)   Pulse (!) 118   Resp 20   SpO2 100%   Physical Exam Vitals and nursing note reviewed.  Constitutional:      General: She is not in acute distress.    Appearance: She is  well-developed. She is toxic-appearing.     Comments: Patient with dry lips, and smells of urine and feces  HENT:     Head: Normocephalic and atraumatic.     Nose: Nose normal.     Mouth/Throat:     Mouth: Mucous membranes are dry.  Eyes:     Conjunctiva/sclera: Conjunctivae normal.  Cardiovascular:     Rate and Rhythm: Regular rhythm. Tachycardia present.     Pulses: Normal pulses.     Heart sounds: Normal heart sounds. No murmur heard. Pulmonary:     Effort: Pulmonary effort is normal. No respiratory distress.     Breath sounds: Normal breath sounds. No wheezing.  Abdominal:     Palpations: Abdomen is soft.     Tenderness: There is no abdominal tenderness.  Musculoskeletal:        General: No swelling.     Cervical back: Neck supple.     Comments: Patient has closely pitting edema to left arm.  Patient has intact bilateral radial and dorsalis pedis pulses.  No pitting edema to right arm or lower extremities.  Skin:    General: Skin is warm and dry.     Capillary Refill: Capillary refill takes less than 2 seconds.     Comments: First-degree pressure ulcer noted to patient's sacrum  Neurological:  Mental Status: She is alert. She is disoriented.     GCS: GCS eye subscore is 3. GCS verbal subscore is 4. GCS motor subscore is 6.  Psychiatric:        Mood and Affect: Mood normal.     (all labs ordered are listed, but only abnormal results are displayed) Labs Reviewed  RESP PANEL BY RT-PCR (RSV, FLU A&B, COVID)  RVPGX2  CULTURE, BLOOD (ROUTINE X 2)  CULTURE, BLOOD (ROUTINE X 2)  COMPREHENSIVE METABOLIC PANEL WITH GFR  CBC WITH DIFFERENTIAL/PLATELET  PROTIME-INR  URINALYSIS, W/ REFLEX TO CULTURE (INFECTION SUSPECTED)  PRO BRAIN NATRIURETIC PEPTIDE  I-STAT CG4 LACTIC ACID, ED  POC OCCULT BLOOD, ED    EKG: None  Radiology: No results found.  {Document cardiac monitor, telemetry assessment procedure when appropriate:32947} Procedures   Medications Ordered in the  ED  lactated ringers  infusion (has no administration in time range)  ceFEPIme  (MAXIPIME ) 2 g in sodium chloride  0.9 % 100 mL IVPB (has no administration in time range)  metroNIDAZOLE  (FLAGYL ) IVPB 500 mg (has no administration in time range)  vancomycin  (VANCOCIN ) IVPB 1000 mg/200 mL premix (has no administration in time range)  acetaminophen  (TYLENOL ) suppository 650 mg (has no administration in time range)      {Click here for ABCD2, HEART and other calculators REFRESH Note before signing:1}                              Medical Decision Making  This patient presents to the ED for concern of AMS, this involves an extensive number of treatment options, and is a complaint that carries with it a high risk of complications and morbidity.  The differential diagnosis includes UTI, sepsis, PE, ischemic limb, CHF, flu, COVID, RSV, hypoglycemia, hyperglycemia, DKA, HHS   Co morbidities / Chronic conditions that complicate the patient evaluation  Diabetes, hypertension, asthma, CHF, PE   Additional history obtained:  Additional history obtained from EMR External records from outside source obtained and reviewed including previous admission documents   Lab Tests:  I Ordered, and personally interpreted labs.  The pertinent results include: Hemoccult positive,   Imaging Studies ordered:  I ordered imaging studies including chest x-ray I independently visualized and interpreted imaging which showed no acute cardiopulmonary abnormality. I agree with the radiologist interpretation   Cardiac Monitoring: / EKG:  The patient was maintained on a cardiac monitor.  I personally viewed and interpreted the cardiac monitored which showed an underlying rhythm of: ***   Problem List / ED Course / Critical interventions / Medication management I ordered medication including metronidazole , Tylenol , cefepime , vancomycin  for NSTEMI, IVF Reevaluation of the patient after these medicines showed that  the patient *** I have reviewed the patients home medicines and have made adjustments as needed   Consultations Obtained:  I requested consultation with the ***,  and discussed lab and imaging findings as well as pertinent plan - they recommend: ***  Test / Admission - Considered:  ***   {Document critical care time when appropriate  Document review of labs and clinical decision tools ie CHADS2VASC2, etc  Document your independent review of radiology images and any outside records  Document your discussion with family members, caretakers and with consultants  Document social determinants of health affecting pt's care  Document your decision making why or why not admission, treatments were needed:32947:::1}   Final diagnoses:  None    ED Discharge Orders  None        "

## 2024-01-29 NOTE — Transitions of Care (Post Inpatient/ED Visit) (Signed)
 01/29/2024  Patient ID: Angie Carr, female   DOB: May 28, 1955, 69 y.o.   MRN: [id number]  Call attempted to reach patient to follow up on hospital follow up appointment and home health. Patient did not answer at this time and unable to leave a voicemail, stating voicemail not set up.  Call and spoke with Ty'sia at Dr. Kennieth Leech office at [facility] to assist in getting a hospital follow up appointment, orders for Home Health to resume with [facility], and possible wheelchair per patient's request.  Follow up with patient scheduled for next week TOC follow up.  Richerd Fish, RN, BSN, CCM [facility], [facility] RN Care Manager Direct Dial: [phone]

## 2024-01-29 NOTE — ED Triage Notes (Signed)
 Pt BIB [facility] from home for AMS and lethargy getting worse over the last 4 days. Tonight they checked and she wasn't communicating and her L arm is swollen. Patient usually GCS 15, now GCS 9. Patient unable to answer questions, EMS reports pungent, malodorous urine, emesis, and fever. BP 100/66 EtCO2 8 98% RA CBG 131 Temp 102 HR 130 RR 26

## 2024-01-29 NOTE — ED Provider Notes (Cosign Needed)
 " [facility] Provider Note   CSN: [id number] Arrival date & time: 01/29/24  2235     Patient presents with: Code Sepsis   Angie Carr is a 69 y.o. female.  Presents today via EMS for altered mental status and lethargy getting worse over the last 4 days.  Family said she was not communicating as well as she typically does and her left arm was swollen today.  Patient oriented to self and place but not time or situation.  Patient febrile with EMS.   HPI     Prior to Admission medications  Medication Sig Start Date End Date Taking? Authorizing Provider  apixaban  (ELIQUIS ) 5 MG TABS tablet Take 5 mg by mouth 2 (two) times daily.    [provider]  cilostazol  (PLETAL ) 50 MG tablet Take 50 mg by mouth 2 (two) times daily.    [provider]  fesoterodine  (TOVIAZ ) 4 MG TB24 tablet Take 4 mg by mouth daily.    [provider]  metoprolol  tartrate (LOPRESSOR ) 25 MG tablet Take 0.5 tablets (12.5 mg total) by mouth 2 (two) times daily. 01/18/24   Fairy Frames, MD  pantoprazole  (PROTONIX ) 40 MG tablet Take 1 tablet (40 mg total) by mouth daily. 01/18/24   Fairy Frames, MD  Potassium Chloride  ER 20 MEQ TBCR Take 1 tablet by mouth daily.    [provider]  pravastatin  (PRAVACHOL ) 40 MG tablet Take 40 mg by mouth daily in the afternoon.    [provider]  torsemide  (DEMADEX ) 20 MG tablet Take 1 tablet (20 mg total) by mouth daily. 01/18/24   Fairy Frames, MD    Allergies: Patient has no known allergies.    Review of Systems  Psychiatric/Behavioral:  Positive for confusion.     Updated Vital Signs BP 114/78   Pulse (!) 113   Temp 98.2 F (36.8 C) (Rectal)   Resp 19   Ht 5' 4 (1.626 m)   Wt 64.8 kg   SpO2 100%   BMI 24.52 kg/m   Physical Exam Vitals and nursing note reviewed.  Constitutional:      General: She is not in acute distress.    Appearance: She is well-developed. She  is toxic-appearing.     Comments: Patient with dry lips, and smells of urine and feces  HENT:     Head: Normocephalic and atraumatic.     Nose: Nose normal.     Mouth/Throat:     Mouth: Mucous membranes are dry.  Eyes:     Conjunctiva/sclera: Conjunctivae normal.  Cardiovascular:     Rate and Rhythm: Regular rhythm. Tachycardia present.     Pulses: Normal pulses.     Heart sounds: Normal heart sounds. No murmur heard. Pulmonary:     Effort: Pulmonary effort is normal. No respiratory distress.     Breath sounds: Normal breath sounds. No wheezing.  Abdominal:     Palpations: Abdomen is soft.     Tenderness: There is no abdominal tenderness.  Musculoskeletal:        General: No swelling.     Cervical back: Neck supple.     Comments: Patient has closely pitting edema to left arm.  Patient has intact bilateral radial and dorsalis pedis pulses.  No pitting edema to right arm or lower extremities.  Skin:    General: Skin is warm and dry.     Capillary Refill: Capillary refill takes less than 2 seconds.  Comments: First-degree pressure ulcer noted to patient's sacrum  Neurological:     Mental Status: She is alert. She is disoriented.     GCS: GCS eye subscore is 3. GCS verbal subscore is 4. GCS motor subscore is 6.  Psychiatric:        Mood and Affect: Mood normal.     (all labs ordered are listed, but only abnormal results are displayed) Labs Reviewed  COMPREHENSIVE METABOLIC PANEL WITH GFR - Abnormal; Notable for the following components:      Result Value   Sodium 158 (*)    Potassium 3.2 (*)    Chloride 116 (*)    CO2 17 (*)    BUN 40 (*)    Creatinine, Ser 1.80 (*)    Calcium  7.7 (*)    AST 292 (*)    ALT 286 (*)    Alkaline Phosphatase 214 (*)    Total Bilirubin 1.5 (*)    GFR, Estimated 30 (*)    Anion gap 26 (*)    All other components within normal limits  CBC WITH DIFFERENTIAL/PLATELET - Abnormal; Notable for the following components:   RBC 3.73 (*)    RDW  21.8 (*)    Platelets 121 (*)    nRBC 0.9 (*)    All other components within normal limits  URINALYSIS, W/ REFLEX TO CULTURE (INFECTION SUSPECTED) - Abnormal; Notable for the following components:   Color, Urine AMBER (*)    APPearance HAZY (*)    Bilirubin Urine SMALL (*)    Protein, ur 30 (*)    Bacteria, UA RARE (*)    All other components within normal limits  PRO BRAIN NATRIURETIC PEPTIDE - Abnormal; Notable for the following components:   Pro Brain Natriuretic Peptide 4,016.0 (*)    All other components within normal limits  I-STAT CG4 LACTIC ACID, ED - Abnormal; Notable for the following components:   Lactic Acid, Venous 6.5 (*)    All other components within normal limits  POC OCCULT BLOOD, ED - Abnormal; Notable for the following components:   Fecal Occult Blood, POC Positive (*)    All other components within normal limits  I-STAT CG4 LACTIC ACID, ED - Abnormal; Notable for the following components:   Lactic Acid, Venous 7.2 (*)    All other components within normal limits  RESP PANEL BY RT-PCR (RSV, FLU A&B, COVID)  RVPGX2  CULTURE, BLOOD (ROUTINE X 2)  CULTURE, BLOOD (ROUTINE X 2)  PROTIME-INR  CK  I-STAT CG4 LACTIC ACID, ED    EKG: EKG Interpretation Date/Time:  Thursday January 30 2024 02:02:50 EST Ventricular Rate:  101 PR Interval:  151 QRS Duration:  78 QT Interval:  387 QTC Calculation: 502 R Axis:   74  Text Interpretation: Sinus or ectopic atrial tachycardia Low voltage, extremity and precordial leads Abnormal T, consider ischemia, lateral leads Prolonged QT interval When compared with ECG of 01/14/2024, No significant change was found Confirmed by Raford Lenis ([id number]) on 01/30/2024 2:56:36 AM  Radiology: CT CHEST ABDOMEN PELVIS W CONTRAST Result Date: 01/30/2024 EXAM: CT CHEST WITH CONTRAST 01/30/2024 01:42:01 AM TECHNIQUE: CT of the chest was performed with the administration of 75 mL of intravenous iohexol  (OMNIPAQUE ) 350 MG/ML injection. Multiplanar  reformatted images are provided for review. Automated exposure control, iterative reconstruction, and/or weight based adjustment of the mA/kV was utilized to reduce the radiation dose to as low as reasonably achievable. COMPARISON: Findings are compared to 11/13/2023. CLINICAL HISTORY: Sepsis, altered mental status, lethargy.  FINDINGS: MEDIASTINUM: Heart and pericardium: Extensive multivessel coronary artery calcifications. Trace pericardial effusion. Global cardiac size within normal limits. Central airways: The central airways are clear. Vessels: The central pulmonary arteries are enlarged in keeping with changes of pulmonary arterial hypertension. Dilation of the ascending aorta measuring 4.0 cm in maximal diameter. Descending thoracic aorta is of normal caliber. Moderate atherosclerotic calcification within the thoracic aorta. Recommend annual imaging followup by CTA or MRA. This recommendation follows 2010 ACCF/AHA/AATS/ACR/ASA/SCA/SCAI/SIR/STS/SVM guidelines for the diagnosis and management of patients with thoracic aortic disease. Circulation. 2010; 121: Z733-z630. Aortic aneurysm NOS (ICD-10-I71.9). LYMPH NODES: No mediastinal, hilar or axillary lymphadenopathy. LUNGS AND PLEURA: Lungs: Mild emphysema. No focal consolidation or pulmonary edema. Pleura: No pleural effusion or pneumothorax. SOFT TISSUES/BONES: Soft tissues: Ovoid, smooth margined soft tissue lesion within the right paraspinal region measuring 2.5 cm along the right lateral aspect of the T9 vertebral body (43/3) representing a small neurogenic tumor such as a schwannoma or ganglioneuroma. This is unchanged from prior examination. Moderate fat-containing supraumbilical ventral hernia. Bones: Left total hip arthroplasty has been performed. Severe degenerative changes are seen within the right hip. Osseous structures are diffusely osteopenic. Osseous structures are otherwise age appropriate. No acute bone abnormality. UPPER ABDOMEN: Liver: Severe  hepatic steatosis. Gallbladder: Status post cholecystectomy. Bowel: Moderate sigmoid diverticulosis without superimposed acute inflammatory change. Appendix normal. The stomach, small bowel, and large bowel are otherwise unremarkable. Pelvis: Uterus absent. No adnexal masses. Vessels: Extensive aortoiliac atherosclerotic disease. RAF score: Aortic atherosclerosis (ICD-10-I70.0), emphysema (ICD-10-J43.9), aortic aneurysm (ICD-10-I71.9). IMPRESSION: 1. Mild emphysema. 2. Enlarged central pulmonary arteries, consistent with pulmonary arterial hypertension. 3. Ascending aortic dilation measuring 4.0 cm in maximal diameter. Recommend annual imaging follow-up by CTA or MRA. 4. Moderate atherosclerotic calcification within the thoracic aorta. 5. Severe hepatic steatosis. 6. Moderate sigmoid diverticulosis without superimposed acute inflammatory change. 7. Ovoid, smooth margined soft tissue lesion within the right paraspinal region measuring 2.5 cm along the right lateral aspect of the T9 vertebral body, unchanged from prior examination, likely representing a small neurogenic tumor such as a schwannoma or ganglioglioma. 8. Moderate fat-containing supraumbilical ventral hernia. Electronically signed by: Dorethia Molt MD 01/30/2024 02:04 AM EST RP Workstation: [id number]   CT Head Wo Contrast Result Date: 01/30/2024 EXAM: CT HEAD WITHOUT CONTRAST 01/30/2024 01:42:01 AM TECHNIQUE: CT of the head was performed without the administration of intravenous contrast. Automated exposure control, iterative reconstruction, and/or weight based adjustment of the mA/kV was utilized to reduce the radiation dose to as low as reasonably achievable. COMPARISON: MRI dated 11/18/2023. CLINICAL HISTORY: Mental status change, unknown cause. FINDINGS: BRAIN AND VENTRICLES: No acute hemorrhage. No evidence of acute infarct. Patchy and confluent decreased attenuation throughout deep and periventricular white matter of cerebral hemispheres  bilaterally, compatible with chronic microvascular ischemic disease. Chronic basal ganglia lacunar infarcts are again seen and stable. Cerebral ventricle sizes concordant with degree of cerebral volume loss. No extra-axial collection. No mass effect or midline shift. Atherosclerotic calcifications within cavernous internal carotid arteries. ORBITS: No acute abnormality. SINUSES: No acute abnormality. SOFT TISSUES AND SKULL: No acute soft tissue abnormality. No skull fracture. IMPRESSION: 1. Chronic atrophic and ischemic changes. No acute abnormality noted. Electronically signed by: Oneil Devonshire MD 01/30/2024 01:47 AM EST RP Workstation: [id number]   DG Chest Port 1 View Result Date: 01/29/2024 EXAM: 1 VIEW(S) XRAY OF THE CHEST 01/29/2024 11:42:25 PM COMPARISON: 01/14/2024 CLINICAL HISTORY: Possible sepsis. FINDINGS: LUNGS AND PLEURA: No focal pulmonary opacity. No pleural effusion. No pneumothorax. HEART AND MEDIASTINUM: Tortuous thoracic aorta with  aortic atherosclerosis. BONES AND SOFT TISSUES: Multilevel degenerative changes of spine. IMPRESSION: 1. No acute cardiopulmonary abnormality. Electronically signed by: Oneil Devonshire MD 01/29/2024 11:46 PM EST RP Workstation: [id number]     .Critical Care  Performed by: Francis Ileana SAILOR, PA-C Authorized by: Francis Ileana SAILOR, PA-C   Critical care provider statement:    Critical care time (minutes):  30   Critical care was necessary to treat or prevent imminent or life-threatening deterioration of the following conditions:  Sepsis   Critical care was time spent personally by me on the following activities:  Development of treatment plan with patient or surrogate, discussions with consultants, evaluation of patient's response to treatment, examination of patient, ordering and review of laboratory studies, ordering and review of radiographic studies, ordering and performing treatments and interventions, pulse oximetry, re-evaluation of patient's condition and review  of old charts   Care discussed with: admitting provider      Medications Ordered in the ED  lactated ringers  infusion ( Intravenous New Bag/Given 01/30/24 0026)  lactated ringers  infusion ( Intravenous Not Given 01/30/24 0208)  ceFEPIme  (MAXIPIME ) 2 g in sodium chloride  0.9 % 100 mL IVPB (0 g Intravenous Stopped 01/30/24 0113)  metroNIDAZOLE  (FLAGYL ) IVPB 500 mg (0 mg Intravenous Stopped 01/30/24 0237)  vancomycin  (VANCOCIN ) IVPB 1000 mg/200 mL premix (1,000 mg Intravenous New Bag/Given 01/30/24 0240)  acetaminophen  (TYLENOL ) suppository 650 mg (650 mg Rectal Given by Other 01/29/24 2315)  sodium chloride  0.9 % bolus 1,000 mL (1,000 mLs Intravenous New Bag/Given 01/30/24 0207)  iohexol  (OMNIPAQUE ) 350 MG/ML injection 75 mL (75 mLs Intravenous Contrast Given 01/30/24 0139)                                    Medical Decision Making Amount and/or Complexity of Data Reviewed Labs: ordered. Radiology: ordered.  Risk OTC drugs. Prescription drug management. Decision regarding hospitalization.   This patient presents to the ED for concern of AMS, this involves an extensive number of treatment options, and is a complaint that carries with it a high risk of complications and morbidity.  The differential diagnosis includes UTI, sepsis, PE, ischemic limb, CHF, flu, COVID, RSV, hypoglycemia, hyperglycemia, DKA, HHS   Co morbidities / Chronic conditions that complicate the patient evaluation  Diabetes, hypertension, asthma, CHF, PE   Additional history obtained:  Additional history obtained from EMR External records from outside source obtained and reviewed including previous admission documents   Lab Tests:  I Ordered, and personally interpreted labs.  The pertinent results include: Hemoccult positive, lactic 6.5, repeat lactic 7.2, pro time and INR WNL, CBC unremarkable, sodium 158, hypokalemia 3.2, elevated chloride at 116, reduced CO2 at 17, elevated bun at 40, elevated creatinine at 1.8  from a baseline of approximately 1.3, elevated anion gap at 26, elevated alk phos at 214, elevated AST at 292, elevated ALT at 286, elevated total bili at 1.5, elevated proBNP at 4016, CK 48, urine with small bilirubin, 30 protein, rare bacteria, 6-10 WBCs, negative respiratory panel,   Imaging Studies ordered:  I ordered imaging studies including chest x-ray I independently visualized and interpreted imaging which showed no acute cardiopulmonary abnormality. I agree with the radiologist interpretation CT head Noncon: Chronic atrophic and ischemic changes.  No acute abnormality noted CT chest abdomen pelvis with contrast: Mild emphysema, enlarged central pulmonary arteries, consistent with pulmonary artery hypertension.   Cardiac Monitoring: / EKG:  The patient was maintained on  a cardiac monitor.  I personally viewed and interpreted the cardiac monitored which showed an underlying rhythm of: Sinus or ectopic atrial tachycardia   Problem List / ED Course / Critical interventions / Medication management I ordered medication including metronidazole , Tylenol , cefepime , vancomycin , IVF I have reviewed the patients home medicines and have made adjustments as needed   Consultations Obtained:  I requested consultation with the intensivist, Norleen Cedar, PA-C,  and discussed lab and imaging findings as well as pertinent plan - they recommend: They would consult on the patient while in the ED and admitted  Test / Admission - Considered:  Admit     Final diagnoses:  Sepsis with acute organ dysfunction and septic shock, due to unspecified organism, unspecified organ dysfunction type [facility])    ED Discharge Orders     None          Francis Ileana SAILOR, PA-C 01/30/24 0429  "

## 2024-01-29 NOTE — Sepsis Progress Note (Signed)
 Elink monitoring for the code sepsis protocol.

## 2024-01-29 NOTE — ED Provider Notes (Incomplete Revision)
 " [facility] Provider Note   CSN: [id number] Arrival date & time: 01/29/24  2235     Patient presents with: Code Sepsis   Angie Carr is a 69 y.o. female.  Presents today via EMS for altered mental status and lethargy getting worse over the last 4 days.  Family said she was not communicating as well as she typically does and her left arm was swollen today.  Patient oriented to self and place but not time or situation.  Patient febrile with EMS.   HPI     Prior to Admission medications  Medication Sig Start Date End Date Taking? Authorizing Provider  apixaban  (ELIQUIS ) 5 MG TABS tablet Take 5 mg by mouth 2 (two) times daily.    [provider]  cilostazol  (PLETAL ) 50 MG tablet Take 50 mg by mouth 2 (two) times daily.    [provider]  fesoterodine  (TOVIAZ ) 4 MG TB24 tablet Take 4 mg by mouth daily.    [provider]  metoprolol  tartrate (LOPRESSOR ) 25 MG tablet Take 0.5 tablets (12.5 mg total) by mouth 2 (two) times daily. 01/18/24   Fairy Frames, MD  pantoprazole  (PROTONIX ) 40 MG tablet Take 1 tablet (40 mg total) by mouth daily. 01/18/24   Fairy Frames, MD  Potassium Chloride  ER 20 MEQ TBCR Take 1 tablet by mouth daily.    [provider]  pravastatin  (PRAVACHOL ) 40 MG tablet Take 40 mg by mouth daily in the afternoon.    [provider]  torsemide  (DEMADEX ) 20 MG tablet Take 1 tablet (20 mg total) by mouth daily. 01/18/24   Fairy Frames, MD    Allergies: Patient has no known allergies.    Review of Systems  Psychiatric/Behavioral:  Positive for confusion.     Updated Vital Signs BP 114/78   Pulse (!) 113   Temp 98.2 F (36.8 C) (Rectal)   Resp 19   Ht 5' 4 (1.626 m)   Wt 64.8 kg   SpO2 100%   BMI 24.52 kg/m   Physical Exam Vitals and nursing note reviewed.  Constitutional:      General: She is not in acute distress.    Appearance: She is well-developed. She  is toxic-appearing.     Comments: Patient with dry lips, and smells of urine and feces  HENT:     Head: Normocephalic and atraumatic.     Nose: Nose normal.     Mouth/Throat:     Mouth: Mucous membranes are dry.  Eyes:     Conjunctiva/sclera: Conjunctivae normal.  Cardiovascular:     Rate and Rhythm: Regular rhythm. Tachycardia present.     Pulses: Normal pulses.     Heart sounds: Normal heart sounds. No murmur heard. Pulmonary:     Effort: Pulmonary effort is normal. No respiratory distress.     Breath sounds: Normal breath sounds. No wheezing.  Abdominal:     Palpations: Abdomen is soft.     Tenderness: There is no abdominal tenderness.  Musculoskeletal:        General: No swelling.     Cervical back: Neck supple.     Comments: Patient has closely pitting edema to left arm.  Patient has intact bilateral radial and dorsalis pedis pulses.  No pitting edema to right arm or lower extremities.  Skin:    General: Skin is warm and dry.     Capillary Refill: Capillary refill takes less than 2 seconds.  Comments: First-degree pressure ulcer noted to patient's sacrum  Neurological:     Mental Status: She is alert. She is disoriented.     GCS: GCS eye subscore is 3. GCS verbal subscore is 4. GCS motor subscore is 6.  Psychiatric:        Mood and Affect: Mood normal.     (all labs ordered are listed, but only abnormal results are displayed) Labs Reviewed  COMPREHENSIVE METABOLIC PANEL WITH GFR - Abnormal; Notable for the following components:      Result Value   Sodium 158 (*)    Potassium 3.2 (*)    Chloride 116 (*)    CO2 17 (*)    BUN 40 (*)    Creatinine, Ser 1.80 (*)    Calcium  7.7 (*)    AST 292 (*)    ALT 286 (*)    Alkaline Phosphatase 214 (*)    Total Bilirubin 1.5 (*)    GFR, Estimated 30 (*)    Anion gap 26 (*)    All other components within normal limits  CBC WITH DIFFERENTIAL/PLATELET - Abnormal; Notable for the following components:   RBC 3.73 (*)    RDW  21.8 (*)    Platelets 121 (*)    nRBC 0.9 (*)    All other components within normal limits  URINALYSIS, W/ REFLEX TO CULTURE (INFECTION SUSPECTED) - Abnormal; Notable for the following components:   Color, Urine AMBER (*)    APPearance HAZY (*)    Bilirubin Urine SMALL (*)    Protein, ur 30 (*)    Bacteria, UA RARE (*)    All other components within normal limits  PRO BRAIN NATRIURETIC PEPTIDE - Abnormal; Notable for the following components:   Pro Brain Natriuretic Peptide 4,016.0 (*)    All other components within normal limits  I-STAT CG4 LACTIC ACID, ED - Abnormal; Notable for the following components:   Lactic Acid, Venous 6.5 (*)    All other components within normal limits  POC OCCULT BLOOD, ED - Abnormal; Notable for the following components:   Fecal Occult Blood, POC Positive (*)    All other components within normal limits  I-STAT CG4 LACTIC ACID, ED - Abnormal; Notable for the following components:   Lactic Acid, Venous 7.2 (*)    All other components within normal limits  RESP PANEL BY RT-PCR (RSV, FLU A&B, COVID)  RVPGX2  CULTURE, BLOOD (ROUTINE X 2)  CULTURE, BLOOD (ROUTINE X 2)  PROTIME-INR  CK  I-STAT CG4 LACTIC ACID, ED    EKG: EKG Interpretation Date/Time:  Thursday January 30 2024 02:02:50 EST Ventricular Rate:  101 PR Interval:  151 QRS Duration:  78 QT Interval:  387 QTC Calculation: 502 R Axis:   74  Text Interpretation: Sinus or ectopic atrial tachycardia Low voltage, extremity and precordial leads Abnormal T, consider ischemia, lateral leads Prolonged QT interval When compared with ECG of 01/14/2024, No significant change was found Confirmed by Raford Lenis ([id number]) on 01/30/2024 2:56:36 AM  Radiology: CT CHEST ABDOMEN PELVIS W CONTRAST Result Date: 01/30/2024 EXAM: CT CHEST WITH CONTRAST 01/30/2024 01:42:01 AM TECHNIQUE: CT of the chest was performed with the administration of 75 mL of intravenous iohexol  (OMNIPAQUE ) 350 MG/ML injection. Multiplanar  reformatted images are provided for review. Automated exposure control, iterative reconstruction, and/or weight based adjustment of the mA/kV was utilized to reduce the radiation dose to as low as reasonably achievable. COMPARISON: Findings are compared to 11/13/2023. CLINICAL HISTORY: Sepsis, altered mental status, lethargy.  FINDINGS: MEDIASTINUM: Heart and pericardium: Extensive multivessel coronary artery calcifications. Trace pericardial effusion. Global cardiac size within normal limits. Central airways: The central airways are clear. Vessels: The central pulmonary arteries are enlarged in keeping with changes of pulmonary arterial hypertension. Dilation of the ascending aorta measuring 4.0 cm in maximal diameter. Descending thoracic aorta is of normal caliber. Moderate atherosclerotic calcification within the thoracic aorta. Recommend annual imaging followup by CTA or MRA. This recommendation follows 2010 ACCF/AHA/AATS/ACR/ASA/SCA/SCAI/SIR/STS/SVM guidelines for the diagnosis and management of patients with thoracic aortic disease. Circulation. 2010; 121: Z733-z630. Aortic aneurysm NOS (ICD-10-I71.9). LYMPH NODES: No mediastinal, hilar or axillary lymphadenopathy. LUNGS AND PLEURA: Lungs: Mild emphysema. No focal consolidation or pulmonary edema. Pleura: No pleural effusion or pneumothorax. SOFT TISSUES/BONES: Soft tissues: Ovoid, smooth margined soft tissue lesion within the right paraspinal region measuring 2.5 cm along the right lateral aspect of the T9 vertebral body (43/3) representing a small neurogenic tumor such as a schwannoma or ganglioneuroma. This is unchanged from prior examination. Moderate fat-containing supraumbilical ventral hernia. Bones: Left total hip arthroplasty has been performed. Severe degenerative changes are seen within the right hip. Osseous structures are diffusely osteopenic. Osseous structures are otherwise age appropriate. No acute bone abnormality. UPPER ABDOMEN: Liver: Severe  hepatic steatosis. Gallbladder: Status post cholecystectomy. Bowel: Moderate sigmoid diverticulosis without superimposed acute inflammatory change. Appendix normal. The stomach, small bowel, and large bowel are otherwise unremarkable. Pelvis: Uterus absent. No adnexal masses. Vessels: Extensive aortoiliac atherosclerotic disease. RAF score: Aortic atherosclerosis (ICD-10-I70.0), emphysema (ICD-10-J43.9), aortic aneurysm (ICD-10-I71.9). IMPRESSION: 1. Mild emphysema. 2. Enlarged central pulmonary arteries, consistent with pulmonary arterial hypertension. 3. Ascending aortic dilation measuring 4.0 cm in maximal diameter. Recommend annual imaging follow-up by CTA or MRA. 4. Moderate atherosclerotic calcification within the thoracic aorta. 5. Severe hepatic steatosis. 6. Moderate sigmoid diverticulosis without superimposed acute inflammatory change. 7. Ovoid, smooth margined soft tissue lesion within the right paraspinal region measuring 2.5 cm along the right lateral aspect of the T9 vertebral body, unchanged from prior examination, likely representing a small neurogenic tumor such as a schwannoma or ganglioglioma. 8. Moderate fat-containing supraumbilical ventral hernia. Electronically signed by: Dorethia Molt MD 01/30/2024 02:04 AM EST RP Workstation: [id number]   CT Head Wo Contrast Result Date: 01/30/2024 EXAM: CT HEAD WITHOUT CONTRAST 01/30/2024 01:42:01 AM TECHNIQUE: CT of the head was performed without the administration of intravenous contrast. Automated exposure control, iterative reconstruction, and/or weight based adjustment of the mA/kV was utilized to reduce the radiation dose to as low as reasonably achievable. COMPARISON: MRI dated 11/18/2023. CLINICAL HISTORY: Mental status change, unknown cause. FINDINGS: BRAIN AND VENTRICLES: No acute hemorrhage. No evidence of acute infarct. Patchy and confluent decreased attenuation throughout deep and periventricular white matter of cerebral hemispheres  bilaterally, compatible with chronic microvascular ischemic disease. Chronic basal ganglia lacunar infarcts are again seen and stable. Cerebral ventricle sizes concordant with degree of cerebral volume loss. No extra-axial collection. No mass effect or midline shift. Atherosclerotic calcifications within cavernous internal carotid arteries. ORBITS: No acute abnormality. SINUSES: No acute abnormality. SOFT TISSUES AND SKULL: No acute soft tissue abnormality. No skull fracture. IMPRESSION: 1. Chronic atrophic and ischemic changes. No acute abnormality noted. Electronically signed by: Oneil Devonshire MD 01/30/2024 01:47 AM EST RP Workstation: [id number]   DG Chest Port 1 View Result Date: 01/29/2024 EXAM: 1 VIEW(S) XRAY OF THE CHEST 01/29/2024 11:42:25 PM COMPARISON: 01/14/2024 CLINICAL HISTORY: Possible sepsis. FINDINGS: LUNGS AND PLEURA: No focal pulmonary opacity. No pleural effusion. No pneumothorax. HEART AND MEDIASTINUM: Tortuous thoracic aorta with  aortic atherosclerosis. BONES AND SOFT TISSUES: Multilevel degenerative changes of spine. IMPRESSION: 1. No acute cardiopulmonary abnormality. Electronically signed by: Oneil Devonshire MD 01/29/2024 11:46 PM EST RP Workstation: [id number]     .Critical Care  Performed by: Francis Ileana SAILOR, PA-C Authorized by: Francis Ileana SAILOR, PA-C   Critical care provider statement:    Critical care time (minutes):  30   Critical care was necessary to treat or prevent imminent or life-threatening deterioration of the following conditions:  Sepsis   Critical care was time spent personally by me on the following activities:  Development of treatment plan with patient or surrogate, discussions with consultants, evaluation of patient's response to treatment, examination of patient, ordering and review of laboratory studies, ordering and review of radiographic studies, ordering and performing treatments and interventions, pulse oximetry, re-evaluation of patient's condition and review  of old charts   Care discussed with: admitting provider      Medications Ordered in the ED  lactated ringers  infusion ( Intravenous New Bag/Given 01/30/24 0026)  lactated ringers  infusion ( Intravenous Not Given 01/30/24 0208)  ceFEPIme  (MAXIPIME ) 2 g in sodium chloride  0.9 % 100 mL IVPB (0 g Intravenous Stopped 01/30/24 0113)  metroNIDAZOLE  (FLAGYL ) IVPB 500 mg (0 mg Intravenous Stopped 01/30/24 0237)  vancomycin  (VANCOCIN ) IVPB 1000 mg/200 mL premix (1,000 mg Intravenous New Bag/Given 01/30/24 0240)  acetaminophen  (TYLENOL ) suppository 650 mg (650 mg Rectal Given by Other 01/29/24 2315)  sodium chloride  0.9 % bolus 1,000 mL (1,000 mLs Intravenous New Bag/Given 01/30/24 0207)  iohexol  (OMNIPAQUE ) 350 MG/ML injection 75 mL (75 mLs Intravenous Contrast Given 01/30/24 0139)                                    Medical Decision Making Amount and/or Complexity of Data Reviewed Labs: ordered. Radiology: ordered.  Risk OTC drugs. Prescription drug management. Decision regarding hospitalization.   This patient presents to the ED for concern of AMS, this involves an extensive number of treatment options, and is a complaint that carries with it a high risk of complications and morbidity.  The differential diagnosis includes UTI, sepsis, PE, ischemic limb, CHF, flu, COVID, RSV, hypoglycemia, hyperglycemia, DKA, HHS   Co morbidities / Chronic conditions that complicate the patient evaluation  Diabetes, hypertension, asthma, CHF, PE   Additional history obtained:  Additional history obtained from EMR External records from outside source obtained and reviewed including previous admission documents   Lab Tests:  I Ordered, and personally interpreted labs.  The pertinent results include: Hemoccult positive, lactic 6.5, repeat lactic 7.2, pro time and INR WNL, CBC unremarkable, sodium 158, hypokalemia 3.2, elevated chloride at 116, reduced CO2 at 17, elevated bun at 40, elevated creatinine at 1.8  from a baseline of approximately 1.3, elevated anion gap at 26, elevated alk phos at 214, elevated AST at 292, elevated ALT at 286, elevated total bili at 1.5, elevated proBNP at 4016, CK 48, urine with small bilirubin, 30 protein, rare bacteria, 6-10 WBCs, negative respiratory panel,   Imaging Studies ordered:  I ordered imaging studies including chest x-ray I independently visualized and interpreted imaging which showed no acute cardiopulmonary abnormality. I agree with the radiologist interpretation CT head Noncon: Chronic atrophic and ischemic changes.  No acute abnormality noted CT chest abdomen pelvis with contrast: Mild emphysema, enlarged central pulmonary arteries, consistent with pulmonary artery hypertension.   Cardiac Monitoring: / EKG:  The patient was maintained on  a cardiac monitor.  I personally viewed and interpreted the cardiac monitored which showed an underlying rhythm of: Sinus or ectopic atrial tachycardia   Problem List / ED Course / Critical interventions / Medication management I ordered medication including metronidazole , Tylenol , cefepime , vancomycin , IVF I have reviewed the patients home medicines and have made adjustments as needed   Consultations Obtained:  I requested consultation with the intensivist, Norleen Cedar, PA-C and Dr. Layman,  and discussed lab and imaging findings as well as pertinent plan - they recommend: Who felt that the patient did not need critical care admission at this time, however would consult on the patient if there was a decline in patient's stability Consulted hospitalist, Dr.   Test / Admission - Considered:  Admit     Final diagnoses:  Sepsis with acute organ dysfunction and septic shock, due to unspecified organism, unspecified organ dysfunction type [facility])    ED Discharge Orders     None          Francis Ileana SAILOR, PA-C 01/30/24 0429  "

## 2024-01-30 ENCOUNTER — Inpatient Hospital Stay (HOSPITAL_COMMUNITY)

## 2024-01-30 ENCOUNTER — Other Ambulatory Visit: Payer: Self-pay

## 2024-01-30 ENCOUNTER — Emergency Department (HOSPITAL_COMMUNITY)

## 2024-01-30 DIAGNOSIS — R6520 Severe sepsis without septic shock: Secondary | ICD-10-CM | POA: Diagnosis not present

## 2024-01-30 DIAGNOSIS — G928 Other toxic encephalopathy: Secondary | ICD-10-CM | POA: Diagnosis not present

## 2024-01-30 DIAGNOSIS — R29709 NIHSS score 9: Secondary | ICD-10-CM

## 2024-01-30 DIAGNOSIS — A419 Sepsis, unspecified organism: Secondary | ICD-10-CM | POA: Diagnosis present

## 2024-01-30 DIAGNOSIS — I6381 Other cerebral infarction due to occlusion or stenosis of small artery: Secondary | ICD-10-CM

## 2024-01-30 DIAGNOSIS — I63541 Cerebral infarction due to unspecified occlusion or stenosis of right cerebellar artery: Secondary | ICD-10-CM | POA: Diagnosis not present

## 2024-01-30 DIAGNOSIS — Z7901 Long term (current) use of anticoagulants: Secondary | ICD-10-CM | POA: Diagnosis not present

## 2024-01-30 DIAGNOSIS — I639 Cerebral infarction, unspecified: Secondary | ICD-10-CM | POA: Diagnosis present

## 2024-01-30 DIAGNOSIS — G319 Degenerative disease of nervous system, unspecified: Secondary | ICD-10-CM

## 2024-01-30 LAB — CBG MONITORING, ED
Glucose-Capillary: 80 mg/dL (ref 70–99)
Glucose-Capillary: 68 mg/dL — ABNORMAL LOW (ref 70–99)
Glucose-Capillary: 106 mg/dL — ABNORMAL HIGH (ref 70–99)

## 2024-01-30 LAB — CK: Total CK: 48 U/L (ref 38–234)

## 2024-01-30 LAB — I-STAT ARTERIAL BLOOD GAS, ED
Acid-base deficit: 11 mmol/L — ABNORMAL HIGH (ref 0.0–2.0)
Bicarbonate: 10.7 mmol/L — ABNORMAL LOW (ref 20.0–28.0)
Calcium, Ion: 0.94 mmol/L — ABNORMAL LOW (ref 1.15–1.40)
HCT: 25 % — ABNORMAL LOW (ref 36.0–46.0)
Hemoglobin: 8.5 g/dL — ABNORMAL LOW (ref 12.0–15.0)
O2 Saturation: 100 %
Potassium: 2.8 mmol/L — ABNORMAL LOW (ref 3.5–5.1)
Sodium: 153 mmol/L — ABNORMAL HIGH (ref 135–145)
TCO2: 11 mmol/L — ABNORMAL LOW (ref 22–32)
pCO2 arterial: 15.1 mmHg — CL (ref 32–48)
pH, Arterial: 7.46 — ABNORMAL HIGH (ref 7.35–7.45)
pO2, Arterial: 164 mmHg — ABNORMAL HIGH (ref 83–108)

## 2024-01-30 LAB — COMPREHENSIVE METABOLIC PANEL WITH GFR
ALT: 286 U/L — ABNORMAL HIGH (ref 0–44)
ALT: 290 U/L — ABNORMAL HIGH (ref 0–44)
ALT: 216 U/L — ABNORMAL HIGH (ref 0–44)
AST: 292 U/L — ABNORMAL HIGH (ref 15–41)
AST: 298 U/L — ABNORMAL HIGH (ref 15–41)
AST: 212 U/L — ABNORMAL HIGH (ref 15–41)
Albumin: 3.5 g/dL (ref 3.5–5.0)
Albumin: 3.5 g/dL (ref 3.5–5.0)
Albumin: 2.4 g/dL — ABNORMAL LOW (ref 3.5–5.0)
Alkaline Phosphatase: 214 U/L — ABNORMAL HIGH (ref 38–126)
Alkaline Phosphatase: 126 U/L (ref 38–126)
Alkaline Phosphatase: 86 U/L (ref 38–126)
Anion gap: 26 — ABNORMAL HIGH (ref 5–15)
Anion gap: 29 — ABNORMAL HIGH (ref 5–15)
Anion gap: 26 — ABNORMAL HIGH (ref 5–15)
BUN: 40 mg/dL — ABNORMAL HIGH (ref 8–23)
BUN: 40 mg/dL — ABNORMAL HIGH (ref 8–23)
BUN: 38 mg/dL — ABNORMAL HIGH (ref 8–23)
CO2: 17 mmol/L — ABNORMAL LOW (ref 22–32)
CO2: 15 mmol/L — ABNORMAL LOW (ref 22–32)
CO2: 13 mmol/L — ABNORMAL LOW (ref 22–32)
Calcium: 7.7 mg/dL — ABNORMAL LOW (ref 8.9–10.3)
Calcium: 7.6 mg/dL — ABNORMAL LOW (ref 8.9–10.3)
Calcium: 6.5 mg/dL — ABNORMAL LOW (ref 8.9–10.3)
Chloride: 116 mmol/L — ABNORMAL HIGH (ref 98–111)
Chloride: 114 mmol/L — ABNORMAL HIGH (ref 98–111)
Chloride: 116 mmol/L — ABNORMAL HIGH (ref 98–111)
Creatinine, Ser: 1.8 mg/dL — ABNORMAL HIGH (ref 0.44–1.00)
Creatinine, Ser: 1.77 mg/dL — ABNORMAL HIGH (ref 0.44–1.00)
Creatinine, Ser: 1.58 mg/dL — ABNORMAL HIGH (ref 0.44–1.00)
GFR, Estimated: 30 mL/min — ABNORMAL LOW
GFR, Estimated: 31 mL/min — ABNORMAL LOW
GFR, Estimated: 35 mL/min — ABNORMAL LOW
Glucose, Bld: 87 mg/dL (ref 70–99)
Glucose, Bld: 85 mg/dL (ref 70–99)
Glucose, Bld: 102 mg/dL — ABNORMAL HIGH (ref 70–99)
Potassium: 3.2 mmol/L — ABNORMAL LOW (ref 3.5–5.1)
Potassium: 3.3 mmol/L — ABNORMAL LOW (ref 3.5–5.1)
Potassium: 3.2 mmol/L — ABNORMAL LOW (ref 3.5–5.1)
Sodium: 158 mmol/L — ABNORMAL HIGH (ref 135–145)
Sodium: 158 mmol/L — ABNORMAL HIGH (ref 135–145)
Sodium: 154 mmol/L — ABNORMAL HIGH (ref 135–145)
Total Bilirubin: 1.5 mg/dL — ABNORMAL HIGH (ref 0.0–1.2)
Total Bilirubin: 1.5 mg/dL — ABNORMAL HIGH (ref 0.0–1.2)
Total Bilirubin: 1.1 mg/dL (ref 0.0–1.2)
Total Protein: 6.7 g/dL (ref 6.5–8.1)
Total Protein: 6.7 g/dL (ref 6.5–8.1)
Total Protein: 4.6 g/dL — ABNORMAL LOW (ref 6.5–8.1)

## 2024-01-30 LAB — URINALYSIS, W/ REFLEX TO CULTURE (INFECTION SUSPECTED)
Glucose, UA: NEGATIVE mg/dL
Hgb urine dipstick: NEGATIVE
Ketones, ur: NEGATIVE mg/dL
Leukocytes,Ua: NEGATIVE
Nitrite: NEGATIVE
Protein, ur: 30 mg/dL — AB
Specific Gravity, Urine: 1.023 (ref 1.005–1.030)
pH: 5 (ref 5.0–8.0)

## 2024-01-30 LAB — CBC WITH DIFFERENTIAL/PLATELET
Abs Immature Granulocytes: 0.04 10*3/uL (ref 0.00–0.07)
Abs Immature Granulocytes: 0.06 10*3/uL (ref 0.00–0.07)
Basophils Absolute: 0 10*3/uL (ref 0.0–0.1)
Basophils Absolute: 0 10*3/uL (ref 0.0–0.1)
Basophils Relative: 0 %
Basophils Relative: 0 %
Eosinophils Absolute: 0 10*3/uL (ref 0.0–0.5)
Eosinophils Absolute: 0 10*3/uL (ref 0.0–0.5)
Eosinophils Relative: 0 %
Eosinophils Relative: 0 %
HCT: 37 % (ref 36.0–46.0)
HCT: 27.2 % — ABNORMAL LOW (ref 36.0–46.0)
Hemoglobin: 12.5 g/dL (ref 12.0–15.0)
Hemoglobin: 9.2 g/dL — ABNORMAL LOW (ref 12.0–15.0)
Immature Granulocytes: 1 %
Immature Granulocytes: 1 %
Lymphocytes Relative: 14 %
Lymphocytes Relative: 9 %
Lymphs Abs: 1.2 10*3/uL (ref 0.7–4.0)
Lymphs Abs: 1 10*3/uL (ref 0.7–4.0)
MCH: 33.5 pg (ref 26.0–34.0)
MCH: 33.7 pg (ref 26.0–34.0)
MCHC: 33.8 g/dL (ref 30.0–36.0)
MCHC: 33.8 g/dL (ref 30.0–36.0)
MCV: 99.2 fL (ref 80.0–100.0)
MCV: 99.6 fL (ref 80.0–100.0)
Monocytes Absolute: 0.4 10*3/uL (ref 0.1–1.0)
Monocytes Absolute: 0.5 10*3/uL (ref 0.1–1.0)
Monocytes Relative: 4 %
Monocytes Relative: 4 %
Neutro Abs: 7 10*3/uL (ref 1.7–7.7)
Neutro Abs: 9.1 10*3/uL — ABNORMAL HIGH (ref 1.7–7.7)
Neutrophils Relative %: 81 %
Neutrophils Relative %: 86 %
Platelets: 121 10*3/uL — ABNORMAL LOW (ref 150–400)
Platelets: 127 10*3/uL — ABNORMAL LOW (ref 150–400)
RBC: 3.73 MIL/uL — ABNORMAL LOW (ref 3.87–5.11)
RBC: 2.73 MIL/uL — ABNORMAL LOW (ref 3.87–5.11)
RDW: 21.8 % — ABNORMAL HIGH (ref 11.5–15.5)
RDW: 21.7 % — ABNORMAL HIGH (ref 11.5–15.5)
Smear Review: NORMAL
WBC: 8.6 10*3/uL (ref 4.0–10.5)
WBC: 10.6 10*3/uL — ABNORMAL HIGH (ref 4.0–10.5)
nRBC: 0.9 % — ABNORMAL HIGH (ref 0.0–0.2)
nRBC: 0.6 % — ABNORMAL HIGH (ref 0.0–0.2)

## 2024-01-30 LAB — PROTIME-INR
INR: 1.1 (ref 0.8–1.2)
INR: 1.3 — ABNORMAL HIGH (ref 0.8–1.2)
Prothrombin Time: 15.2 s (ref 11.4–15.2)
Prothrombin Time: 17.1 s — ABNORMAL HIGH (ref 11.4–15.2)

## 2024-01-30 LAB — URINALYSIS, ROUTINE W REFLEX MICROSCOPIC
Glucose, UA: NEGATIVE mg/dL
Hgb urine dipstick: NEGATIVE
Ketones, ur: NEGATIVE mg/dL
Leukocytes,Ua: NEGATIVE
Nitrite: NEGATIVE
Protein, ur: 30 mg/dL — AB
Specific Gravity, Urine: >1.06 — ABNORMAL HIGH (ref 1.005–1.030)
pH: 5 (ref 5.0–8.0)

## 2024-01-30 LAB — CBC
HCT: 29 % — ABNORMAL LOW (ref 36.0–46.0)
Hemoglobin: 9.8 g/dL — ABNORMAL LOW (ref 12.0–15.0)
MCH: 33.7 pg (ref 26.0–34.0)
MCHC: 33.8 g/dL (ref 30.0–36.0)
MCV: 99.7 fL (ref 80.0–100.0)
Platelets: 129 10*3/uL — ABNORMAL LOW (ref 150–400)
RBC: 2.91 MIL/uL — ABNORMAL LOW (ref 3.87–5.11)
RDW: 21.9 % — ABNORMAL HIGH (ref 11.5–15.5)
WBC: 12.3 10*3/uL — ABNORMAL HIGH (ref 4.0–10.5)
nRBC: 0.6 % — ABNORMAL HIGH (ref 0.0–0.2)

## 2024-01-30 LAB — BETA-HYDROXYBUTYRIC ACID: Beta-Hydroxybutyric Acid: 2.09 mmol/L — ABNORMAL HIGH (ref 0.05–0.27)

## 2024-01-30 LAB — TSH: TSH: 6.02 u[IU]/mL — ABNORMAL HIGH (ref 0.350–4.500)

## 2024-01-30 LAB — MRSA NEXT GEN BY PCR, NASAL: MRSA by PCR Next Gen: DETECTED — AB

## 2024-01-30 LAB — PHOSPHORUS: Phosphorus: 3.9 mg/dL (ref 2.5–4.6)

## 2024-01-30 LAB — I-STAT CG4 LACTIC ACID, ED
Lactic Acid, Venous: 6.5 mmol/L (ref 0.5–1.9)
Lactic Acid, Venous: 6.5 mmol/L (ref 0.5–1.9)
Lactic Acid, Venous: 7.2 mmol/L (ref 0.5–1.9)
Lactic Acid, Venous: 7.4 mmol/L (ref 0.5–1.9)
Lactic Acid, Venous: 8.1 mmol/L (ref 0.5–1.9)

## 2024-01-30 LAB — GLUCOSE, CAPILLARY
Glucose-Capillary: 81 mg/dL (ref 70–99)
Glucose-Capillary: 111 mg/dL — ABNORMAL HIGH (ref 70–99)

## 2024-01-30 LAB — HEMOGLOBIN A1C
Hgb A1c MFr Bld: 4.6 % — ABNORMAL LOW (ref 4.8–5.6)
Mean Plasma Glucose: 85.32 mg/dL

## 2024-01-30 LAB — RESP PANEL BY RT-PCR (RSV, FLU A&B, COVID)  RVPGX2
Influenza A by PCR: NEGATIVE
Influenza B by PCR: NEGATIVE
Resp Syncytial Virus by PCR: NEGATIVE
SARS Coronavirus 2 by RT PCR: NEGATIVE

## 2024-01-30 LAB — CREATININE, SERUM
Creatinine, Ser: 1.49 mg/dL — ABNORMAL HIGH (ref 0.44–1.00)
GFR, Estimated: 38 mL/min — ABNORMAL LOW

## 2024-01-30 LAB — LACTIC ACID, PLASMA: Lactic Acid, Venous: 3.9 mmol/L (ref 0.5–1.9)

## 2024-01-30 LAB — LIPID PANEL
Cholesterol: 79 mg/dL (ref 0–200)
HDL: 23 mg/dL — ABNORMAL LOW
LDL Cholesterol: 30 mg/dL (ref 0–99)
Total CHOL/HDL Ratio: 3.5 ratio
Triglycerides: 130 mg/dL
VLDL: 26 mg/dL (ref 0–40)

## 2024-01-30 LAB — PRO BRAIN NATRIURETIC PEPTIDE: Pro Brain Natriuretic Peptide: 4016 pg/mL — ABNORMAL HIGH

## 2024-01-30 LAB — APTT: aPTT: 30 s (ref 24–36)

## 2024-01-30 LAB — MAGNESIUM: Magnesium: 1.4 mg/dL — ABNORMAL LOW (ref 1.7–2.4)

## 2024-01-30 MED ORDER — SODIUM CHLORIDE 0.9 % IV BOLUS (SEPSIS): 1000.0000 mL | Freq: Once | INTRAVENOUS | Status: DC

## 2024-01-30 MED ORDER — HYDROMORPHONE HCL 1 MG/ML IJ SOLN
0.5000 mg | INTRAMUSCULAR | Status: AC | PRN
  Administered 2024-01-31: 0.5 mg via INTRAVENOUS
  Administered 2024-01-31: 1 mg via INTRAVENOUS
  Administered 2024-01-31: 0.5 mg via INTRAVENOUS
  Administered 2024-02-01 – 2024-02-07 (×2): 1 mg via INTRAVENOUS
  Filled 2024-01-30 (×6): qty 1

## 2024-01-30 MED ORDER — LACTATED RINGERS IV SOLN: 150.0000 mL/h | INTRAVENOUS | Status: DC

## 2024-01-30 MED ORDER — METRONIDAZOLE 500 MG/100ML IV SOLN: 500.0000 mg | Freq: Two times a day (BID) | INTRAVENOUS | Status: DC

## 2024-01-30 MED ORDER — TRAZODONE HCL 50 MG PO TABS
25.0000 mg | ORAL_TABLET | Freq: Every evening | ORAL | Status: AC | PRN
  Administered 2024-02-06: 25 mg via ORAL
  Filled 2024-01-30: qty 1

## 2024-01-30 MED ORDER — ONDANSETRON HCL 4 MG PO TABS: 4.0000 mg | ORAL_TABLET | Freq: Four times a day (QID) | ORAL | Status: AC | PRN

## 2024-01-30 MED ORDER — SODIUM CHLORIDE 0.9% FLUSH
3.0000 mL | Freq: Two times a day (BID) | INTRAVENOUS | Status: AC
  Administered 2024-01-30 – 2024-02-07 (×17): 3 mL via INTRAVENOUS

## 2024-01-30 MED ORDER — POTASSIUM CHLORIDE 10 MEQ/100ML IV SOLN
10.0000 meq | Freq: Once | INTRAVENOUS | Status: AC
  Administered 2024-01-30: 10 meq via INTRAVENOUS

## 2024-01-30 MED ORDER — IPRATROPIUM BROMIDE 0.02 % IN SOLN: 0.5000 mg | Freq: Four times a day (QID) | RESPIRATORY_TRACT | Status: AC | PRN

## 2024-01-30 MED ORDER — METRONIDAZOLE 500 MG/100ML IV SOLN
500.0000 mg | Freq: Two times a day (BID) | INTRAVENOUS | Status: DC
  Administered 2024-01-30: 500 mg via INTRAVENOUS
  Filled 2024-01-30: qty 100

## 2024-01-30 MED ORDER — SODIUM CHLORIDE 0.9% FLUSH
3.0000 mL | Freq: Two times a day (BID) | INTRAVENOUS | Status: DC
  Administered 2024-01-30 – 2024-02-06 (×13): 3 mL via INTRAVENOUS

## 2024-01-30 MED ORDER — LACTATED RINGERS IV SOLN: INTRAVENOUS | Status: DC

## 2024-01-30 MED ORDER — VANCOMYCIN HCL IN DEXTROSE 1-5 GM/200ML-% IV SOLN: 1000.0000 mg | Freq: Once | INTRAVENOUS | Status: DC

## 2024-01-30 MED ORDER — LACTATED RINGERS IV BOLUS (SEPSIS)
1000.0000 mL | Freq: Once | INTRAVENOUS | Status: AC
  Administered 2024-01-30: 1000 mL via INTRAVENOUS

## 2024-01-30 MED ORDER — OXYCODONE HCL 5 MG PO TABS
5.0000 mg | ORAL_TABLET | ORAL | Status: AC | PRN
  Administered 2024-01-31 – 2024-02-03 (×3): 5 mg via ORAL
  Filled 2024-01-30 (×3): qty 1

## 2024-01-30 MED ORDER — FLEET ENEMA RE ENEM: 1.0000 | ENEMA | Freq: Once | RECTAL | Status: DC | PRN

## 2024-01-30 MED ORDER — HEPARIN SODIUM (PORCINE) 5000 UNIT/ML IJ SOLN: 5000.0000 [IU] | Freq: Three times a day (TID) | INTRAMUSCULAR | Status: DC

## 2024-01-30 MED ORDER — DEXTROSE-SODIUM CHLORIDE 5-0.45 % IV SOLN: INTRAVENOUS | Status: DC

## 2024-01-30 MED ORDER — THIAMINE HCL 100 MG/ML IJ SOLN
500.0000 mg | INTRAVENOUS | Status: AC
  Administered 2024-01-30 – 2024-02-03 (×5): 500 mg via INTRAVENOUS
  Filled 2024-01-30: qty 5
  Filled 2024-01-30 (×2): qty 500
  Filled 2024-01-30: qty 5
  Filled 2024-01-30: qty 466.67

## 2024-01-30 MED ORDER — POTASSIUM CHLORIDE 20 MEQ PO PACK: 20.0000 meq | PACK | Freq: Once | ORAL | Status: DC

## 2024-01-30 MED ORDER — INSULIN ASPART 100 UNIT/ML IJ SOLN
0.0000 [IU] | Freq: Three times a day (TID) | INTRAMUSCULAR | Status: DC
  Administered 2024-01-31: 2 [IU] via SUBCUTANEOUS
  Filled 2024-01-30: qty 2

## 2024-01-30 MED ORDER — SENNOSIDES-DOCUSATE SODIUM 8.6-50 MG PO TABS: 1.0000 | ORAL_TABLET | Freq: Every evening | ORAL | Status: AC | PRN

## 2024-01-30 MED ORDER — CHLORHEXIDINE GLUCONATE CLOTH 2 % EX PADS
6.0000 | MEDICATED_PAD | Freq: Every day | CUTANEOUS | Status: DC
  Administered 2024-01-30 – 2024-02-06 (×8): 6 via TOPICAL

## 2024-01-30 MED ORDER — HEPARIN SODIUM (PORCINE) 5000 UNIT/ML IJ SOLN
5000.0000 [IU] | Freq: Three times a day (TID) | INTRAMUSCULAR | Status: DC
  Administered 2024-01-31 (×2): 5000 [IU] via SUBCUTANEOUS
  Filled 2024-01-30 (×2): qty 1

## 2024-01-30 MED ORDER — PIPERACILLIN-TAZOBACTAM 3.375 G IVPB
3.3750 g | Freq: Three times a day (TID) | INTRAVENOUS | Status: DC
  Administered 2024-01-30 – 2024-02-02 (×9): 3.375 g via INTRAVENOUS
  Filled 2024-01-30 (×9): qty 50

## 2024-01-30 MED ORDER — LACTATED RINGERS IV BOLUS (SEPSIS): 1000.0000 mL | Freq: Once | INTRAVENOUS | Status: DC

## 2024-01-30 MED ORDER — STROKE: EARLY STAGES OF RECOVERY BOOK
Freq: Once | Status: AC
  Filled 2024-01-30: qty 1

## 2024-01-30 MED ORDER — VANCOMYCIN VARIABLE DOSE PER UNSTABLE RENAL FUNCTION (PHARMACIST DOSING): Status: DC

## 2024-01-30 MED ORDER — ACETAMINOPHEN 325 MG PO TABS
650.0000 mg | ORAL_TABLET | Freq: Four times a day (QID) | ORAL | Status: AC | PRN
  Administered 2024-01-31 – 2024-02-06 (×2): 650 mg via ORAL
  Filled 2024-01-30 (×2): qty 2

## 2024-01-30 MED ORDER — SODIUM CHLORIDE 0.9 % IV SOLN: 2.0000 g | Freq: Once | INTRAVENOUS | Status: DC

## 2024-01-30 MED ORDER — HYDRALAZINE HCL 20 MG/ML IJ SOLN: 10.0000 mg | INTRAMUSCULAR | Status: DC | PRN

## 2024-01-30 MED ORDER — SODIUM CHLORIDE 0.9 % IV SOLN: INTRAVENOUS | Status: DC

## 2024-01-30 MED ORDER — LORAZEPAM 2 MG/ML IJ SOLN
0.5000 mg | Freq: Once | INTRAMUSCULAR | Status: AC
  Administered 2024-01-30: 0.5 mg via INTRAVENOUS
  Filled 2024-01-30: qty 1

## 2024-01-30 MED ORDER — MAGNESIUM SULFATE 2 GM/50ML IV SOLN
2.0000 g | Freq: Once | INTRAVENOUS | Status: AC
  Administered 2024-01-30: 2 g via INTRAVENOUS
  Filled 2024-01-30: qty 50

## 2024-01-30 MED ORDER — DEXTROSE 50 % IV SOLN
12.5000 g | Freq: Once | INTRAVENOUS | Status: AC
  Administered 2024-01-30: 12.5 g via INTRAVENOUS
  Filled 2024-01-30: qty 50

## 2024-01-30 MED ORDER — IOHEXOL 350 MG/ML SOLN
75.0000 mL | Freq: Once | INTRAVENOUS | Status: AC | PRN
  Administered 2024-01-30: 75 mL via INTRAVENOUS

## 2024-01-30 MED ORDER — SODIUM CHLORIDE 0.9 % IV SOLN: 2.0000 g | INTRAVENOUS | Status: DC

## 2024-01-30 MED ORDER — ACETAMINOPHEN 650 MG RE SUPP: 650.0000 mg | Freq: Four times a day (QID) | RECTAL | Status: AC | PRN

## 2024-01-30 MED ORDER — ONDANSETRON HCL 4 MG/2ML IJ SOLN: 4.0000 mg | Freq: Four times a day (QID) | INTRAMUSCULAR | Status: AC | PRN

## 2024-01-30 MED ORDER — SODIUM CHLORIDE 0.9 % IV BOLUS (SEPSIS)
1000.0000 mL | Freq: Once | INTRAVENOUS | Status: AC
  Administered 2024-01-30: 1000 mL via INTRAVENOUS

## 2024-01-30 MED ORDER — POTASSIUM CHLORIDE 10 MEQ/100ML IV SOLN
10.0000 meq | INTRAVENOUS | Status: AC
  Administered 2024-01-30: 10 meq via INTRAVENOUS
  Filled 2024-01-30 (×2): qty 100

## 2024-01-30 NOTE — Consult Note (Addendum)
 NEUROLOGY CONSULT NOTE   Date of service: January 30, 2024 Patient Name: Angie Carr MRN:  [id number] DOB:  Dec 25, 1955 Chief Complaint: Lethargy Requesting Provider: Willette Adriana LABOR, MD  History of Present Illness  Angie Carr is a 69 y.o. female with hx of PE on Eliquis , DM2, HTN, DJD, chronic anemia, diastolic HFpEF- 55-60%, GERD, esophageal stenosis AAA, PAD, HLD who initially presented to [facility] via EMS due to altered mental status and lethargy that had gotten progressively worse over the previous 4 days.  Sepsis protocol activated in the ER as she met sepsis criteria with unknown etiology initially.  Her lactic on arrival 6.5 -> now 8.1.  An MRI was done due to her altered mental status and she was found to have an acute small acute infarction in the right cerebellum.  Neurology was consulted for stroke workup.  She is currently on Eliquis .   NIHSS components Score: Comment  1a Level of Conscious 0[]  1[x]  2[]  3[]      1b LOC Questions 0[]  1[x]  2[]     Unable to state month  1c LOC Commands 0[x]  1[]  2[]       2 Best Gaze 0[x]  1[]  2[]       3 Visual 0[x]  1[]  2[]  3[]      4 Facial Palsy 0[x]  1[]  2[]  3[]      5a Motor Arm - left 0[x]  1[]  2[]  3[]  4[]  UN[]    5b Motor Arm - Right 0[x]  1[]  2[]  3[]  4[]  UN[]    6a Motor Leg - Left 0[]  1[]  2[]  3[x]  4[]  UN[]    6b Motor Leg - Right 0[]  1[]  2[]  3[x]  4[]  UN[]    7 Limb Ataxia 0[x]  1[]  2[]  UN[]      8 Sensory 0[x]  1[]  2[]  UN[]      9 Best Language 0[x]  1[]  2[]  3[]      10 Dysarthria 0[]  1[x]  2[]  UN[]    Poor dentition  11 Extinct. and Inattention 0[x]  1[]  2[]       TOTAL: 9       ROS  Comprehensive ROS performed and pertinent positives documented in HPI   Past History   Past Medical History:  Diagnosis Date   Anemia    Anxiety    Asthma    pt denies   Diabetes mellitus without complication (HCC)    DJD (degenerative joint disease)    lower back   GERD (gastroesophageal reflux disease)    Headache    miagraines, none in years    Hypertension    LBP (low back pain)    Morbid obesity (HCC)    Neuromuscular disorder (HCC)    feet tingle and her balance is sometimes a problem   Primary osteoarthritis of both knees    Smoker     Past Surgical History:  Procedure Laterality Date   ABDOMINAL HYSTERECTOMY     complete   CHOLECYSTECTOMY     laparascopic   COLONOSCOPY N/A 02/11/2014   Procedure: COLONOSCOPY;  Surgeon: Lamar Donnald GAILS, MD;  Location: [facility] ENDOSCOPY;  Service: Endoscopy;  Laterality: N/A;   ESOPHAGOGASTRODUODENOSCOPY N/A 11/20/2023   Procedure: EGD (ESOPHAGOGASTRODUODENOSCOPY);  Surgeon: San Sandor GAILS, DO;  Location: [facility] ENDOSCOPY;  Service: Gastroenterology;  Laterality: N/A;  with possible dilation   HERNIA REPAIR     ventral on left side.   TOTAL HIP ARTHROPLASTY Left 07/24/2023   Procedure: ARTHROPLASTY, HIP, TOTAL,POSTERIOR APPROACH;  Surgeon: Edna Toribio LABOR, MD;  Location: [facility] ORS;  Service: Orthopedics;  Laterality: Left;   TUBAL LIGATION      Family  History: Family History  Problem Relation Age of Onset   Hypertension Other    Stroke Other     Social History  reports that she has been smoking cigarettes. She has never used smokeless tobacco. She reports current drug use. Drug: Marijuana. She reports that she does not drink alcohol .  Allergies[1]  Medications  Current Medications[2]  Vitals   Vitals:   02/10/24 1405 2024/02/10 1415 Feb 10, 2024 1430 February 10, 2024 1500  BP:  103/70 107/74 110/68  Pulse:  80 80 84  Resp:  16 12 15   Temp: (!) 96.7 F (35.9 C)     TempSrc: Axillary     SpO2:  100% 100% 100%  Weight:      Height:        Body mass index is 24.52 kg/m.   Physical Exam   Constitutional: Chronically ill-appearing Psych: Blunted affect.  Eyes: No scleral injection.  HENT: No OP obstruction.  Head: Normocephalic.  Cardiovascular: Normal rate and regular rhythm.  Respiratory: Effort normal, non-labored breathing.  GI: Soft.  No distension. There is no  tenderness.  Skin: WDI.   Neurologic Examination   Mental Status: Patient opens eyes to voice, able to tell me her name and age, states she is in the hospital Speech is soft, poor dentition No signs of aphasia or neglect Cranial Nerves: II: Blinks to threat bilaterally. Pupils are equal, round, and reactive to light.   III,IV, VI: EOMI without ptosis or diplopia.  V: Facial sensation is symmetric to temperature VII: Facial movement is symmetric resting and smiling VIII: Hearing is intact to voice X: Palate elevates symmetrically XI: Shoulder shrug is symmetric. XII: Tongue protrudes midline without atrophy or fasciculations.  Motor: Tone is normal. Bulk is normal.  Elevates bilateral upper extremities without drift Does not elevate bilateral lower extremities to command, severe pain noted when touching bilateral lower extremities Sensory: Localizes to painful stimuli in all extremities Increased sensitivity to touch and movement in bilateral lower extremities Cerebellar: Difficulty following multistep commands to complete FNF, she does accurately touch her nose with her right hand.     Labs/Imaging/Neurodiagnostic studies   CBC:  Recent Labs  Lab 10-Feb-2024 0010 02-10-24 1030 02-10-24 1440  WBC 8.6  --  10.6*  NEUTROABS 7.0  --  9.1*  HGB 12.5 8.5* 9.2*  HCT 37.0 25.0* 27.2*  MCV 99.2  --  99.6  PLT 121*  --  127*   Basic Metabolic Panel:  Lab Results  Component Value Date   NA 154 (H) February 10, 2024   K 3.2 (L) 02/10/2024   CO2 13 (L) 2024-02-10   GLUCOSE 102 (H) 02-10-2024   BUN 38 (H) February 10, 2024   CREATININE 1.58 (H) February 10, 2024   CALCIUM  6.5 (L) 02/10/2024   GFRNONAA 35 (L) 02-10-2024   GFRAA >60 12/14/2014   Lipid Panel:  Lab Results  Component Value Date   LDLCALC 67 11/04/2012   HgbA1c:  Lab Results  Component Value Date   HGBA1C 4.6 (L) 02/10/24      INR  Lab Results  Component Value Date   INR 1.1 2024-02-10   APTT  Lab Results   Component Value Date   APTT 32 12/11/2014    CT Head without contrast (Personally reviewed): Chronic atrophic and ischemic changes.   MRI Brain (Personally reviewed): Small acute ischemic infarction in the right cerebellum   ASSESSMENT   Angie Carr is a 69 y.o. female with a hx of PE on Eliquis , DM2, HTN, DJD, chronic anemia, diastolic HFpEF-  55-60%, GERD, esophageal stenosis, AAA, PAD, HLD who initially presented to [facility] via EMS due to altered mental status and lethargy that had gotten progressively worse over the previous 4 days.  He was found to have a stroke on MRI and neurology was consulted for stroke work up. She is currently on eliquis  5mg  BID.  - Impression: - Acute small cerebellar stroke. DDx for underlying etiology includes cardioembolic and atherothrombotic/atheroembolic - AMS, most likely secondary to sepsis  RECOMMENDATIONS  - HgbA1c, fasting lipid panel - Vessel imaging with MRA head and Carotid US  - Frequent neuro checks - Echocardiogram - Continue Eliquis  - Risk factor modification - Telemetry monitoring - PT consult, OT consult, Speech consult - Work up for underlying etiology of her BLE pain to include CK level and plain films.  - Stroke team to follow  ______________________________________________________________________    Signed, Jorene Last, NP [facility]  I have seen and examined the patient. I have formulated the assessment and recommendations. 69 year old female presenting with AMS and sepsis. MRI reveals a small acute ischemic infarction within the right cerebellar hemisphere. Exam reveals blunted affect and severe BLE pain. Recommendations as above.  Electronically signed: Dr. Camellia Shark      [1] No Known Allergies [2]  Current Facility-Administered Medications:    acetaminophen  (TYLENOL ) tablet 650 mg, 650 mg, Oral, Q6H PRN **OR** acetaminophen  (TYLENOL ) suppository 650 mg, 650 mg, Rectal, Q6H PRN, Shahmehdi, Seyed  A, MD   Chlorhexidine  Gluconate Cloth 2 % PADS 6 each, 6 each, Topical, Daily, Hussein, Lenny, MD, 6 each at 01/30/24 1621   [START ON 01/31/2024] heparin  injection 5,000 Units, 5,000 Units, Subcutaneous, Q8H, Shahmehdi, Seyed A, MD   hydrALAZINE  (APRESOLINE ) injection 10 mg, 10 mg, Intravenous, Q4H PRN, Shahmehdi, Seyed A, MD   HYDROmorphone  (DILAUDID ) injection 0.5-1 mg, 0.5-1 mg, Intravenous, Q2H PRN, Shahmehdi, Seyed A, MD   insulin  aspart (novoLOG ) injection 0-15 Units, 0-15 Units, Subcutaneous, TID WC, Shahmehdi, Seyed A, MD   ipratropium (ATROVENT ) nebulizer solution 0.5 mg, 0.5 mg, Nebulization, Q6H PRN, Willette Adriana LABOR, MD   lactated ringers  infusion, , Intravenous, Continuous, Francis Ileana SAILOR, PA-C, Last Rate: 150 mL/hr at 01/30/24 1451, New Bag at 01/30/24 1451   ondansetron  (ZOFRAN ) tablet 4 mg, 4 mg, Oral, Q6H PRN **OR** ondansetron  (ZOFRAN ) injection 4 mg, 4 mg, Intravenous, Q6H PRN, Shahmehdi, Seyed A, MD   oxyCODONE  (Oxy IR/ROXICODONE ) immediate release tablet 5 mg, 5 mg, Oral, Q4H PRN, Shahmehdi, Seyed A, MD   piperacillin -tazobactam (ZOSYN ) IVPB 3.375 g, 3.375 g, Intravenous, Q8H, Shahmehdi, Seyed A, MD, Last Rate: 12.5 mL/hr at 01/30/24 1500, 3.375 g at 01/30/24 1500   senna-docusate (Senokot-S) tablet 1 tablet, 1 tablet, Oral, QHS PRN, Shahmehdi, Seyed A, MD   sodium chloride  flush (NS) 0.9 % injection 3 mL, 3 mL, Intravenous, Q12H, Shahmehdi, Seyed A, MD   sodium chloride  flush (NS) 0.9 % injection 3 mL, 3 mL, Intravenous, Q12H, Shahmehdi, Seyed A, MD   thiamine  (VITAMIN B1) 500 mg in sodium chloride  0.9 % 50 mL IVPB, 500 mg, Intravenous, Q24H, Shahmehdi, Seyed A, MD, Stopped at 01/30/24 1621   traZODone  (DESYREL ) tablet 25 mg, 25 mg, Oral, QHS PRN, Shahmehdi, Seyed A, MD   vancomycin  variable dose per unstable renal function (pharmacist dosing), , Does not apply, See admin instructions, Willette Adriana LABOR, MD

## 2024-01-30 NOTE — Progress Notes (Signed)
 PT Cancellation Note  Patient Details Name: Angie Carr MRN: [id number] DOB: [birth year]   Cancelled Treatment:    Reason Eval/Treat Not Completed: Medical issues which prohibited therapy;Fatigue/lethargy limiting ability to participate (Currently pt only withdrawing to pain. Waiting on MRI. Will follow up when able and appropriate.)  Dorothyann Maier, DPT, CLT  Acute Rehabilitation Services Office: [phone] (Secure chat preferred)   Dorothyann VEAR Maier 01/30/2024, 10:26 AM

## 2024-01-30 NOTE — ED Notes (Signed)
 Patient transported to CT

## 2024-01-30 NOTE — ED Notes (Signed)
 Pt transported to MRI

## 2024-01-30 NOTE — Progress Notes (Signed)
 Pharmacy Antibiotic Note  Angie Carr is a 69 y.o. female admitted on 01/29/2024 with sepsis of unknown source.  Pharmacy has been consulted for vancomycin  dosing.  Plan: Vancomycin  dose per levels with AKI noted F/u renal function and vancomycin  levels as needed   Height: 5' 4 (162.6 cm) Weight: 64.8 kg (142 lb 13.7 oz) IBW/kg (Calculated) : 54.7  Temp (24hrs), Avg:98.1 F (36.7 C), Min:98 F (36.7 C), Max:98.2 F (36.8 C)  Recent Labs  Lab 01/30/24 0010 01/30/24 0032 01/30/24 0344 01/30/24 0551  WBC 8.6  --   --   --   CREATININE 1.80*  --   --   --   LATICACIDVEN  --  6.5* 7.2* 6.5*    Estimated Creatinine Clearance: 25.5 mL/min (A) (by C-G formula based on SCr of 1.8 mg/dL (H)).    Allergies[1]  Thank you for allowing pharmacy to be a part of this patients care.  Leonor GORMAN Bash 01/30/2024 8:11 AM     [1] No Known Allergies

## 2024-01-30 NOTE — ED Notes (Signed)
 Unable to pull blood off her IV for labs, phlebotomy requested.

## 2024-01-30 NOTE — ED Notes (Signed)
 Patient back from CT scan.

## 2024-01-30 NOTE — Consult Note (Signed)
 "  NAME:  Angie Carr, MRN:  [id number], DOB:  1955/04/11, LOS: 0 ADMISSION DATE:  01/29/2024, CONSULTATION DATE:  01/30/24 REFERRING MD:  Dr. Adriana Grams, CHIEF COMPLAINT:  Sepsis   History of Present Illness:  Angie Carr is a 69 year old female with a past medical history of hypertension, DM type II not on long term insulin , GERD, Pulmonary embolism (08/28/23) on Eliquis , chronic diastolic congestive heart failure (on GDMT), PAD on Pletal , dysphagia with EGD (11/20/23) with esophageal balloon dilation, morbid obesity and anxiety who presented to [facility] on 1/28 via EMS for altered mental status and progressive/worsening lethargy over 4 days.  EMS reports fever during transport.   Admitted to the hospitalist service with sepsis.  Discussed with Son and family member over the telephone regarding current status on 1/29, reports that Angie Carr has not felt well over the past few days and has not eaten or taken in fluids consistently for at least four days.  Family reports incontinence of stool and medications found on the floor concerning for missed medications for an unknown period of time.  Angie Carr lives at home with son and grandson a nurse care aid comes to the house daily for four hours to assist with ADLs.   In ED: Received Cefepime , Flagyl , and Vancomycin .  In total, given 1000ml of NS and 1000ml of LR, continued on LR infusion at 125cc/hr.  10meq of Potassium.    PCCM Consulted 1/29 for evaluation of sepsis and altered mental status.  Pertinent  Medical History   Past Medical History:  Diagnosis Date   Anemia    Anxiety    Asthma    pt denies   Diabetes mellitus without complication (HCC)    DJD (degenerative joint disease)    lower back   GERD (gastroesophageal reflux disease)    Headache    miagraines, none in years   Hypertension    LBP (low back pain)    Morbid obesity (HCC)    Neuromuscular disorder (HCC)    feet tingle and her balance is  sometimes a problem   Primary osteoarthritis of both knees    Smoker      Significant Hospital Events: Including procedures, antibiotic start and stop dates in addition to other pertinent events   1/29: Admit to hospitalist for sepsis  Interim History / Subjective:   Objective    Blood pressure 127/76, pulse 93, temperature (!) 97 F (36.1 C), temperature source Oral, resp. rate 15, height 5' 4 (1.626 m), weight 64.8 kg, SpO2 100%.        Intake/Output Summary (Last 24 hours) at 01/30/2024 1326 Last data filed at 01/30/2024 1103 Gross per 24 hour  Intake 2173.62 ml  Output --  Net 2173.62 ml   Filed Weights   01/30/24 9662  Weight: 64.8 kg   Examination: General: Ill appearing adult female lying in bed HENT: dry mucous membranes  Lungs: clear to auscultation  Cardiovascular: regular rate and rhythm Abdomen: flat, soft to touch/palpation without tenderness  Extremities: mild erythema on bilateral knees, without edema Neuro: Awakens to voice, follows simple commands, moves all four extremities.  Garbled speech  Assessment and Plan  # Altered Mental Status  # Metabolic Encephalopathy  - Likely due to severe dehydration and sepsis  - Reviewed CT head 1/29 and without acute intracranial abnormality - MRI brain ordered  - Consider EEG if lactic remains elevated after fluid resus   # Sepsis, unknown source  # Lactic Acidosis  #  Elevated Bhb - Febrile, on Flagyl /Vanc/Cefepime - recommend continuing vanc/zosyn  only - Blood cultures in process, Respiratory panel negative, UA negative for acute infection  - Recommend TTE - Lactic continues to be elevated, trend after fluid resuscitation - Elevated Bhb-updated med rec indicates use of SGLT2 inhibitors, less like EDKA more likely 2/2 dehydration/sepsis can continue to trend Bhb    # Electrolyte Derangement  # AKI - Likely 2/2 dehydration status, repeat renal function  - Replace electrolytes as appropriate  - Foley  placement   # Thrombocytopenia # +FOBT - Trend CBC daily   Disposition: Does not require intensive care transfer at this time   Labs   CBC: Recent Labs  Lab 01/30/24 0010 01/30/24 1030  WBC 8.6  --   NEUTROABS 7.0  --   HGB 12.5 8.5*  HCT 37.0 25.0*  MCV 99.2  --   PLT 121*  --     Basic Metabolic Panel: Recent Labs  Lab 01/30/24 0010 01/30/24 0735 01/30/24 1030  NA 158* 158* 153*  K 3.2* 3.3* 2.8*  CL 116* 114*  --   CO2 17* 15*  --   GLUCOSE 87 85  --   BUN 40* 40*  --   CREATININE 1.80* 1.77*  --   CALCIUM  7.7* 7.6*  --   MG  --  1.4*  --   PHOS  --  3.9  --    GFR: Estimated Creatinine Clearance: 25.9 mL/min (A) (by C-G formula based on SCr of 1.77 mg/dL (H)). Recent Labs  Lab 01/30/24 0010 01/30/24 0032 01/30/24 0344 01/30/24 0551 01/30/24 0842  WBC 8.6  --   --   --   --   LATICACIDVEN  --  6.5* 7.2* 6.5* 7.4*    Liver Function Tests: Recent Labs  Lab 01/30/24 0010 01/30/24 0735  AST 292* 298*  ALT 286* 290*  ALKPHOS 214* 126  BILITOT 1.5* 1.5*  PROT 6.7 6.7  ALBUMIN 3.5 3.5   No results for input(s): LIPASE, AMYLASE in the last 168 hours. No results for input(s): AMMONIA in the last 168 hours.  ABG    Component Value Date/Time   PHART 7.460 (H) 01/30/2024 1030   PCO2ART 15.1 (LL) 01/30/2024 1030   PO2ART 164 (H) 01/30/2024 1030   HCO3 10.7 (L) 01/30/2024 1030   TCO2 11 (L) 01/30/2024 1030   ACIDBASEDEF 11.0 (H) 01/30/2024 1030   O2SAT 100 01/30/2024 1030     Coagulation Profile: Recent Labs  Lab 01/30/24 0010  INR 1.1    Cardiac Enzymes: Recent Labs  Lab 01/30/24 0110  CKTOTAL 48    HbA1C: Hgb A1c MFr Bld  Date/Time Value Ref Range Status  07/19/2023 10:07 AM 4.8 4.8 - 5.6 % Final    Comment:    (NOTE) Diagnosis of Diabetes The following HbA1c ranges recommended by the American Diabetes Association (ADA) may be used as an aid in the diagnosis of diabetes mellitus.  Hemoglobin              Suggested A1C NGSP%              Diagnosis  <5.7                   Non Diabetic  5.7-6.4                Pre-Diabetic  >6.4                   Diabetic  <7.0  Glycemic control for                       adults with diabetes.    12/11/2014 11:44 PM 6.0 (H) 4.8 - 5.6 % Final    Comment:    (NOTE)         Pre-diabetes: 5.7 - 6.4         Diabetes: >6.4         Glycemic control for adults with diabetes: <7.0     CBG: Recent Labs  Lab 01/30/24 0800 01/30/24 1203  GLUCAP 80 68*    Review of Systems:   Review of Systems  Constitutional:  Positive for malaise/fatigue.  Eyes: Negative.   Respiratory: Negative.    Cardiovascular: Negative.   Gastrointestinal:  Positive for blood in stool.  Genitourinary: Negative.   Musculoskeletal: Negative.    Past Medical History:  She,  has a past medical history of Anemia, Anxiety, Asthma, Diabetes mellitus without complication (HCC), DJD (degenerative joint disease), GERD (gastroesophageal reflux disease), Headache, Hypertension, LBP (low back pain), Morbid obesity (HCC), Neuromuscular disorder (HCC), Primary osteoarthritis of both knees, and Smoker.   Surgical History:   Past Surgical History:  Procedure Laterality Date   ABDOMINAL HYSTERECTOMY     complete   CHOLECYSTECTOMY     laparascopic   COLONOSCOPY N/A 02/11/2014   Procedure: COLONOSCOPY;  Surgeon: Lamar Donnald GAILS, MD;  Location: [facility] ENDOSCOPY;  Service: Endoscopy;  Laterality: N/A;   ESOPHAGOGASTRODUODENOSCOPY N/A 11/20/2023   Procedure: EGD (ESOPHAGOGASTRODUODENOSCOPY);  Surgeon: San Sandor GAILS, DO;  Location: [facility] ENDOSCOPY;  Service: Gastroenterology;  Laterality: N/A;  with possible dilation   HERNIA REPAIR     ventral on left side.   TOTAL HIP ARTHROPLASTY Left 07/24/2023   Procedure: ARTHROPLASTY, HIP, TOTAL,POSTERIOR APPROACH;  Surgeon: Edna Toribio LABOR, MD;  Location: [facility] ORS;  Service: Orthopedics;  Laterality: Left;   TUBAL LIGATION        Social History:   reports that she has been smoking cigarettes. She has never used smokeless tobacco. She reports current drug use. Drug: Marijuana. She reports that she does not drink alcohol .   Family History:  Her family history includes Hypertension in an other family member; Stroke in an other family member.   Allergies Allergies[1]   Home Medications  Prior to Admission medications  Medication Sig Start Date End Date Taking? Authorizing Provider  apixaban  (ELIQUIS ) 5 MG TABS tablet Take 5 mg by mouth 2 (two) times daily.    [provider]  cilostazol  (PLETAL ) 50 MG tablet Take 50 mg by mouth 2 (two) times daily.    [provider]  fesoterodine  (TOVIAZ ) 4 MG TB24 tablet Take 4 mg by mouth daily.    [provider]  metoprolol  tartrate (LOPRESSOR ) 25 MG tablet Take 0.5 tablets (12.5 mg total) by mouth 2 (two) times daily. 01/18/24   Fairy Frames, MD  pantoprazole  (PROTONIX ) 40 MG tablet Take 1 tablet (40 mg total) by mouth daily. 01/18/24   Fairy Frames, MD  Potassium Chloride  ER 20 MEQ TBCR Take 1 tablet by mouth daily.    [provider]  pravastatin  (PRAVACHOL ) 40 MG tablet Take 40 mg by mouth daily in the afternoon.    [provider]  torsemide  (DEMADEX ) 20 MG tablet Take 1 tablet (20 mg total) by mouth daily. 01/18/24   Fairy Frames, MD     Total time: - This ill due to Sepsis with numerous comorbidities and at significant risk  of hemodynamic worsening, respiratory failure, bleeding, infection, and Hemodynamic instability. This patient's care requires constant monitoring of vital signs, hemodynamics, respiratory and cardiac monitoring, review of multiple databases, neurological assessment, discussion with family, other specialists and medical decision making of high complexity.  Total length of visit 50 minutes, of which more than 50% was spent counseling and/or coordination of care, regarding the prognosis and treatment plan  of sepsis, altered mental status, lactic acidosis and AKI.             [1] No Known Allergies  "

## 2024-01-30 NOTE — Plan of Care (Signed)
" °  Problem: Fluid Volume: Goal: Hemodynamic stability will improve Outcome: Progressing   Problem: Respiratory: Goal: Ability to maintain adequate ventilation will improve 01/30/2024 1813 by Melford Sonny NOVAK, RN Outcome: Progressing 01/30/2024 1622 by Melford Sonny NOVAK, RN Outcome: Progressing   Problem: Education: Goal: Ability to describe self-care measures that may prevent or decrease complications (Diabetes Survival Skills Education) will improve Outcome: Progressing   Problem: Fluid Volume: Goal: Ability to maintain a balanced intake and output will improve Outcome: Progressing   Problem: Metabolic: Goal: Ability to maintain appropriate glucose levels will improve 01/30/2024 1813 by Melford Sonny NOVAK, RN Outcome: Progressing 01/30/2024 1622 by Melford Sonny NOVAK, RN Outcome: Progressing   Problem: Nutritional: Goal: Maintenance of adequate nutrition will improve Outcome: Not Progressing   "

## 2024-01-30 NOTE — Progress Notes (Addendum)
" °  Acute CVA Toxic metabolic encephalopathy  MRI of the brain: MPRESSION: 1. Diffuse cerebral atrophy 2. Acute lacunar infarct in the right-side of the cerebellum   MRI of the brain resulted noted for acute lacunar infarct on the right side of the cerebellum -N.p.o. -Initiating stroke workup including echocardiogram,  PT OT, speech -Optimize medical management 1 p.o. medication once she can tolerate -Continue aggressive IV fluid resuscitation - Not a candidate for aggressive treatment, with last known normal > 4 days ago   Due to above finding, multiple comorbidities, prognosis remain poor Appreciate PCCM follow-up Appreciate neurology follow-up.       SIGNED: Adriana DELENA Grams, MD, FHM. FAAFP Additional critical care time 30 min  [facility],  Pager (please use [email or web address] to page/text)  Please use Epic Secure Chat for non-urgent communication (7AM-7PM) If 7PM-7AM, please contact night-coverage [email or web address],  01/30/2024, 4:31 PM  "

## 2024-01-30 NOTE — Assessment & Plan Note (Signed)
 Patient meeting sepsis criteria, with unknown etiology -UA clear, imaging which includes CT of the head, CT chest abdomen pelvis no obvious abscess infection sites -Pronounced lactic acidosis -Sepsis protocol, initiated aggressive IV fluid resuscitation, broad-spectrum antibiotics -with goal to narrow down over next 24-48 hours -Will follow with cultures

## 2024-01-30 NOTE — Progress Notes (Signed)
 ABG results given to PCCM.   Latest Reference Range & Units 01/30/24 10:30  Sample type  ARTERIAL  pH, Arterial 7.35 - 7.45  7.460 (H)  pCO2 arterial 32 - 48 mmHg 15.1 (LL)  pO2, Arterial 83 - 108 mmHg 164 (H)  TCO2 22 - 32 mmol/L 11 (L)  Acid-base deficit 0.0 - 2.0 mmol/L 11.0 (H)  Bicarbonate 20.0 - 28.0 mmol/L 10.7 (L)  O2 Saturation % 100  Sodium 135 - 145 mmol/L 153 (H)  Potassium 3.5 - 5.1 mmol/L 2.8 (L)  Calcium  Ionized 1.15 - 1.40 mmol/L 0.94 (L)  (LL): Data is critically low (H): Data is abnormally high (L): Data is abnormally low

## 2024-01-30 NOTE — ED Provider Notes (Signed)
 Contacted by radiology tech, Ileana Bohr regarding patient's GFR of 30 and CT chest abdomen pelvis with contrast ordered.  Given patient's severity of illness, have requested we move forward with contrasted CT at this time.   Francis Ileana SAILOR, PA-C 01/30/24 0120    Haze Lonni PARAS, MD 01/31/24 [phone]

## 2024-01-30 NOTE — ED Notes (Signed)
 Pt cleaned by this RN and NT. Bedding and gown changed.

## 2024-01-30 NOTE — Plan of Care (Signed)
  Problem: Fluid Volume: Goal: Hemodynamic stability will improve Outcome: Progressing   Problem: Respiratory: Goal: Ability to maintain adequate ventilation will improve Outcome: Progressing   Problem: Fluid Volume: Goal: Ability to maintain a balanced intake and output will improve Outcome: Progressing   Problem: Metabolic: Goal: Ability to maintain appropriate glucose levels will improve Outcome: Progressing

## 2024-01-30 NOTE — Consult Note (Addendum)
 "                                                  Palliative Care Consult Note                                  Date: 01/30/2024   Patient Name: Angie Carr  DOB:12/08/1955  FMW:[id number]  Age / Sex:68 y.o., female  PCP: Benjamine Aland, MD Referring Physician: Willette Adriana LABOR, MD  Reason for Consultation: Establishing goals of care  Past Medical History:  Diagnosis Date   Anemia    Anxiety    Asthma    pt denies   Diabetes mellitus without complication (HCC)    DJD (degenerative joint disease)    lower back   GERD (gastroesophageal reflux disease)    Headache    miagraines, none in years   Hypertension    LBP (low back pain)    Morbid obesity (HCC)    Neuromuscular disorder (HCC)    feet tingle and her balance is sometimes a problem   Primary osteoarthritis of both knees    Smoker      Assessment & Plan:   HPI/Patient Profile: 69 y.o. female  with past medical history of HTN, T2DM, PE on Eliquis , CHF admitted on 01/29/2024 with sepsis, electrolyte derangement, and acute metabolic encephalopathy. Per critical care consult note on 01/30/2024 by Cogar NP, patient presented with AMS and lethargy after 4 days of poor PO intake. 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 CT A/P on 01/27/2024 demonstrated severe hepatic steatosis and stable 2.5 cm ovoid lesion in the paraspinal lesion along the right lateral aspect of T9 vertebral body. CT head 01/30/2024 demonstrated chronic atrophic and ischemic changes.   Palliative medicine consulted for goals of care conversation and code status discussion.   SUMMARY OF RECOMMENDATIONS   DNR-limited Next of kin decision maker is niece Angie Carr [phone]) Family meeting 01/31/2024 at 0900 Palliative medicine will continue to follow for further goals of care conversation  Symptom Management:  Per primary team  Code Status: Full Code  Prognosis:  Unable to determine  Discharge  Planning:  To Be Determined   Discussed with: Shahmedi MD 01/30/2024 about discussion with family about code status, they would like more time to discuss and consider DNR, planning for family meeting on 1/30 for further goals of care conversation.   Subjective:   Reviewed medical records, received report from team, assessed the patient and then meet at the patient's bedside to discuss diagnosis, prognosis, GOC, EOL wishes disposition and options.  I met with the patient at bedside without any visitors present, spoke on phone with the patient's sister Angie Carr) and niece Angie Carr).   Patient/Family Understanding of Illness: - Reviewed with family that overall it is early in the process but the patient is septic and unresponsive at this time, source of infection is unknown but that patient is critically ill  Baseline Status: - Patient lives at home with her son Angie Carr) and grandson Angie Carr) - Family shares that the patient's son suffered a stroke and is wheel chair bound and is not cognitively intact  Today's Discussion: - Reviewed with family that the patient is critically ill at this time and  is awaiting transfer to ICU for monitoring - Discussed next of kin decision maker and family shared that the patient is widowed and has 2 children but one of them has passed away and the living son Angie Carr) is cognitively impaired and should not be relied on for medical decision making - Patient's niece Angie Carr) is agreeable to be primary health care decision maker for the patient in the meantime but the patient's sister Angie Carr) and grandson Angie Carr) can also be reached for medical decision making at this time - Discussed code status with the family and for now will remain full code, family would like to discuss amongst themselves to consider a DNR, patient's niece Angie) was clear that if it were up to her she would want the patient to be DNR, but would like  more time to discuss with the rest of the patient's family - After family discussion they have elected to make the patient DNR, but would like to continue current medical management  Review of Systems  Unable to perform ROS  Objective:   Primary Diagnoses: Present on Admission:  Sepsis (HCC)  Acute on chronic diastolic (congestive) heart failure (HCC)  Acute kidney injury  Gastroesophageal reflux disease  History of pulmonary embolism  Hypokalemia  Hypomagnesemia  Hypotension  Mixed hyperlipidemia  PAD (peripheral artery disease)  Severe sepsis (HCC)  Uncontrolled type 2 diabetes mellitus with hypoglycemia, without long-term current use of insulin  (HCC)  Vital Signs:  BP 110/68   Pulse 84   Temp (!) 96.7 F (35.9 C) (Axillary)   Resp 15   Ht 5' 4 (1.626 m)   Wt 64.8 kg   SpO2 100%   BMI 24.52 kg/m   Physical Exam Constitutional:      Appearance: She is ill-appearing and toxic-appearing.     Comments: Obtunded  HENT:     Head: Normocephalic and atraumatic.     Nose: Nose normal.     Mouth/Throat:     Mouth: Mucous membranes are dry.     Comments: Missing several teeth Cardiovascular:     Rate and Rhythm: Normal rate.     Pulses: Normal pulses.  Pulmonary:     Effort: Pulmonary effort is normal.  Musculoskeletal:        General: No swelling.     Right lower leg: No edema.     Left lower leg: No edema.  Skin:    Coloration: Skin is pale.     Comments: Cool upper and lower extremities  Neurological:     Mental Status: She is unresponsive.    Palliative Assessment/Data: 30%   Thank you for allowing us  to participate in the care of BRIANE BIRDEN PMT will continue to support holistically.  I personally spent a total of 55 minutes in the care of the patient today including preparing to see the patient, getting/reviewing separately obtained history, performing a medically appropriate exam/evaluation, counseling and educating, referring and communicating  with other health care professionals, and documenting clinical information in the EHR.  Signed by: Angie Carr Palliative Medicine Team  Team Phone # [phone] (Nights/Weekends)  01/30/2024, 4:21 PM   "

## 2024-01-30 NOTE — Progress Notes (Signed)
 eLink Physician-Brief Progress Note Patient Name: Angie Carr DOB: 1955-03-19 MRN: [id number]   Date of Service  01/30/2024  HPI/Events of Note  Lactic acidosis.  Patient also has hyponatremia with hypochloremia.  eICU Interventions  Recheck lactic acid level.  Start IV fluids D5 half-normal saline at 100 mL/h.  Will recheck labs in the morning.     Intervention Category Minor Interventions: Electrolytes abnormality - evaluation and management  Jerilynn Berg 01/30/2024, 8:21 PM

## 2024-01-30 NOTE — Progress Notes (Addendum)
 Critical care attending attestation note:  Patient seen and examined and relevant ancillary tests reviewed.  I agree with the assessment and plan of care as outlined by Cogar, Renda BROCKS, NP    Synopsis of assessment and plan:  61 yr F with history of HTN, DM type II, presents with AMS> 4 days,  Patient family also reports poor appetite, and progressive weakness> 3 weeks   On exam patient is encephalopathic, protecting airway sats well, blood pressure stable no issues Chest clear to auscultation, abdomen soft nontender Labs reviewed POCUS RV <LV, slightly depressed LV, otherwise no obvious pericardial effusion   -Acute metabolic encephalopathy Likely due to dehydration and severe sepsis No history of trauma CT head negative for acute processes -Severe sepsis - Lactic acidosis-likely from severe sepsis, dehydration, CT-not showing obvious cause of lactic acidosis, no PE or mesenteric ischemia - Transaminitis - AKI on CKD - Starvation ketosis -Protein calorie nutrition with poor appetite    Plan -Continuous pulse ox and cardiac monitoring  - For AMS will change cefepime  (neurotoxic iin for kidney function) for Zosyn  -  fluids for dehydration and severe sepsis - High-dose thiamine  - Strict I&O's - Foley  -Follow-up infectious workup -Trend LFTs and lactate - Complete echo -  Closely monitor in the ICU due to lactate of 8     CRITICAL CARE  Lenny Drought, MD  [facility] Prefer epic messenger for cross cover needs   My time 35 min included: review of most recent records, direct face to face time obtaining history, performing physical exam, developing and documenting plan as well as discussing this plan with the patient and/or care givers.  [phone] on-going problem responding inadequately >35 min

## 2024-01-30 NOTE — H&P (Addendum)
 " History and Physical   Patient: Angie Carr                            PCP: Benjamine Aland, MD                    DOB: May 30, 1955            DOA: 01/29/2024 FMW:[id number]             DOS: 01/30/2024, 10:20 AM  Benjamine Aland, MD  Patient coming from:   HOME  I have personally reviewed patient's medical records, in electronic medical records, including:  [place], and care everywhere.    Chief Complaint:   Chief Complaint  Patient presents with   Code Sepsis    History of present illness:    Angie Carr is a 69 year old female with extensive history of PE on Eliquis , DM2, HTN, DJD, chronic anemia, diastolic HFpEF- 55-60%, GERD, esophageal stenosis AAA, PAD, HLD .SABRASABRA  Presents today via EMS for altered mental status and lethargy getting worse over the last 4 days.  Poor historian,-history not clear  Called the family multiple times with no answer Finally found his son [name] with the phone number of [phone] Stating that she has not been responsive and task 3-4 days laying in bed with poor p.o. intake, minimum interaction, may have fallen from her bed once or twice. Since she was laying in bed not doing anything, not talking or eating.  Cannot recall her having fever, having nausea or vomiting.  Cannot not tell us  if she had any asymmetric weaknesses. Became progressively unresponsive therefore EMS was called   ED Evaluation: Blood pressure 128/87, pulse (!) 103, temperature 98 F (36.7 C), temperature source Oral, resp. rate 17, height 5' 4 (1.626 m), weight 64.8 kg, SpO2 100%.  LABs: Na+ 158, K3.3, Chl; 114, CO2 15, BUN 40, creatinine 1.77, calcium  7.6,, WBC 8.6, AST 298, ALT 290, T. bili 1.5, Lactic acid 7.2 -6.5 >> 7.4   No reported fever, Chills, Cough, SOB, Chest Pain, Abd pain, N/V/D, headache, dizziness, lightheadedness,  Dysuria, Joint pain, rash, open wounds   Review of Systems: As per HPI, otherwise 10 point review of systems were negative.    ----------------------------------------------------------------------------------------------------------------------  Allergies[1]  Home MEDs:  Prior to Admission medications  Medication Sig Start Date End Date Taking? Authorizing Provider  apixaban  (ELIQUIS ) 5 MG TABS tablet Take 5 mg by mouth 2 (two) times daily.    [provider]  cilostazol  (PLETAL ) 50 MG tablet Take 50 mg by mouth 2 (two) times daily.    [provider]  fesoterodine  (TOVIAZ ) 4 MG TB24 tablet Take 4 mg by mouth daily.    [provider]  metoprolol  tartrate (LOPRESSOR ) 25 MG tablet Take 0.5 tablets (12.5 mg total) by mouth 2 (two) times daily. 01/18/24   Fairy Frames, MD  pantoprazole  (PROTONIX ) 40 MG tablet Take 1 tablet (40 mg total) by mouth daily. 01/18/24   Fairy Frames, MD  Potassium Chloride  ER 20 MEQ TBCR Take 1 tablet by mouth daily.    [provider]  pravastatin  (PRAVACHOL ) 40 MG tablet Take 40 mg by mouth daily in the afternoon.    [provider]  torsemide  (DEMADEX ) 20 MG tablet Take 1 tablet (20 mg total) by mouth daily. 01/18/24   Fairy Frames, MD    PRN MEDs: acetaminophen  **OR** acetaminophen , hydrALAZINE , HYDROmorphone  (DILAUDID ) injection, ipratropium, ondansetron  **OR**  ondansetron  (ZOFRAN ) IV, oxyCODONE , senna-docusate, traZODone   Past Medical History:  Diagnosis Date   Anemia    Anxiety    Asthma    pt denies   Diabetes mellitus without complication (HCC)    DJD (degenerative joint disease)    lower back   GERD (gastroesophageal reflux disease)    Headache    miagraines, none in years   Hypertension    LBP (low back pain)    Morbid obesity (HCC)    Neuromuscular disorder (HCC)    feet tingle and her balance is sometimes a problem   Primary osteoarthritis of both knees    Smoker     Past Surgical History:  Procedure Laterality Date   ABDOMINAL HYSTERECTOMY     complete   CHOLECYSTECTOMY     laparascopic   COLONOSCOPY  N/A 02/11/2014   Procedure: COLONOSCOPY;  Surgeon: Lamar Donnald GAILS, MD;  Location: [facility] ENDOSCOPY;  Service: Endoscopy;  Laterality: N/A;   ESOPHAGOGASTRODUODENOSCOPY N/A 11/20/2023   Procedure: EGD (ESOPHAGOGASTRODUODENOSCOPY);  Surgeon: San Sandor GAILS, DO;  Location[facility];  Service: Gastroenterology;  Laterality: N/A;  with possible dilation   HERNIA REPAIR     ventral on left side.   TOTAL HIP ARTHROPLASTY Left 07/24/2023   Procedure: ARTHROPLASTY, HIP, TOTAL,POSTERIOR APPROACH;  Surgeon: Edna Toribio LABOR, MD;  Location: [facility] ORS;  Service: Orthopedics;  Laterality: Left;   TUBAL LIGATION       reports that she has been smoking cigarettes. She has never used smokeless tobacco. She reports current drug use. Drug: Marijuana. She reports that she does not drink alcohol .   Family History  Problem Relation Age of Onset   Hypertension Other    Stroke Other     Physical Exam:   Vitals:   01/30/24 0337 01/30/24 0534 01/30/24 0755 01/30/24 0921  BP:  128/87    Pulse:  (!) 103    Resp:  17    Temp:  98 F (36.7 C)  (!) 97 F (36.1 C)  TempSrc:  Oral  Oral  SpO2:  100% 100%   Weight: 64.8 kg     Height: 5' 4 (1.626 m)      Constitutional: Somnolent-difficulty to arouse, responds to pain stimuli Eyes: PERRL, lids and conjunctivae normal ENMT: Mucous membranes are moist. Posterior pharynx clear of any exudate or lesions.Normal dentition.  Neck: normal, supple, no masses, no thyromegaly Respiratory: clear to auscultation bilaterally, no wheezing, no crackles. Normal respiratory effort. No accessory muscle use.  Cardiovascular: Regular rate and rhythm, no murmurs / rubs / gallops. No extremity edema. 2+ pedal pulses. No carotid bruits.  Abdomen: no tenderness, no masses palpated. No hepatosplenomegaly. Bowel sounds positive.  Musculoskeletal: Limited exam patient remained somnolent, or moving extremities in bed  neurologic: Limited exam patient remained to be somnolent   Responding to pain stimuli, few words psychiatric: Normal judgment and insight. Alert and oriented x 3. Normal mood.  Skin: no rashes, lesions, ulcers. No induration          Labs on admission:    I have personally reviewed following labs and imaging studies  CBC: Recent Labs  Lab 01/30/24 0010  WBC 8.6  NEUTROABS 7.0  HGB 12.5  HCT 37.0  MCV 99.2  PLT 121*   Basic Metabolic Panel: Recent Labs  Lab 01/30/24 0010 01/30/24 0735  NA 158* 158*  K 3.2* 3.3*  CL 116* 114*  CO2 17* 15*  GLUCOSE 87 85  BUN 40* 40*  CREATININE 1.80* 1.77*  CALCIUM  7.7* 7.6*  MG  --  1.4*  PHOS  --  3.9   GFR: Estimated Creatinine Clearance: 25.9 mL/min (A) (by C-G formula based on SCr of 1.77 mg/dL (H)). Liver Function Tests: Recent Labs  Lab 01/30/24 0010 01/30/24 0735  AST 292* 298*  ALT 286* 290*  ALKPHOS 214* 126  BILITOT 1.5* 1.5*  PROT 6.7 6.7  ALBUMIN 3.5 3.5   No results for input(s): LIPASE, AMYLASE in the last 168 hours. No results for input(s): AMMONIA in the last 168 hours. Coagulation Profile: Recent Labs  Lab 01/30/24 0010  INR 1.1   Cardiac Enzymes: Recent Labs  Lab 01/30/24 0110  CKTOTAL 48   BNP (last 3 results) Recent Labs    01/14/24 1327 01/30/24 0010  PROBNP 1,157.0* 4,016.0*   HbA1C: No results for input(s): HGBA1C in the last 72 hours. CBG: Recent Labs  Lab 01/30/24 0800  GLUCAP 80   Lipid Profile: No results for input(s): CHOL, HDL, LDLCALC, TRIG, CHOLHDL, LDLDIRECT in the last 72 hours. Thyroid  Function Tests: No results for input(s): TSH, T4TOTAL, FREET4, T3FREE, THYROIDAB in the last 72 hours. Anemia Panel: No results for input(s): VITAMINB12, FOLATE, FERRITIN, TIBC, IRON, RETICCTPCT in the last 72 hours. Urine analysis:    Component Value Date/Time   COLORURINE AMBER (A) 01/30/2024 0202   APPEARANCEUR HAZY (A) 01/30/2024 0202   LABSPEC 1.023 01/30/2024 0202   PHURINE 5.0  01/30/2024 0202   GLUCOSEU NEGATIVE 01/30/2024 0202   HGBUR NEGATIVE 01/30/2024 0202   BILIRUBINUR SMALL (A) 01/30/2024 0202   KETONESUR NEGATIVE 01/30/2024 0202   PROTEINUR 30 (A) 01/30/2024 0202   NITRITE NEGATIVE 01/30/2024 0202   LEUKOCYTESUR NEGATIVE 01/30/2024 0202    Last A1C:  Lab Results  Component Value Date   HGBA1C 4.8 07/19/2023     Radiologic Exams on Admission:   CT CHEST ABDOMEN PELVIS W CONTRAST Result Date: 01/30/2024 EXAM: CT CHEST WITH CONTRAST 01/30/2024 01:42:01 AM TECHNIQUE: CT of the chest was performed with the administration of 75 mL of intravenous iohexol  (OMNIPAQUE ) 350 MG/ML injection. Multiplanar reformatted images are provided for review. Automated exposure control, iterative reconstruction, and/or weight based adjustment of the mA/kV was utilized to reduce the radiation dose to as low as reasonably achievable. COMPARISON: Findings are compared to 11/13/2023. CLINICAL HISTORY: Sepsis, altered mental status, lethargy. FINDINGS: MEDIASTINUM: Heart and pericardium: Extensive multivessel coronary artery calcifications. Trace pericardial effusion. Global cardiac size within normal limits. Central airways: The central airways are clear. Vessels: The central pulmonary arteries are enlarged in keeping with changes of pulmonary arterial hypertension. Dilation of the ascending aorta measuring 4.0 cm in maximal diameter. Descending thoracic aorta is of normal caliber. Moderate atherosclerotic calcification within the thoracic aorta. Recommend annual imaging followup by CTA or MRA. This recommendation follows 2010 ACCF/AHA/AATS/ACR/ASA/SCA/SCAI/SIR/STS/SVM guidelines for the diagnosis and management of patients with thoracic aortic disease. Circulation. 2010; 121: Z733-z630. Aortic aneurysm NOS (ICD-10-I71.9). LYMPH NODES: No mediastinal, hilar or axillary lymphadenopathy. LUNGS AND PLEURA: Lungs: Mild emphysema. No focal consolidation or pulmonary edema. Pleura: No pleural  effusion or pneumothorax. SOFT TISSUES/BONES: Soft tissues: Ovoid, smooth margined soft tissue lesion within the right paraspinal region measuring 2.5 cm along the right lateral aspect of the T9 vertebral body (43/3) representing a small neurogenic tumor such as a schwannoma or ganglioneuroma. This is unchanged from prior examination. Moderate fat-containing supraumbilical ventral hernia. Bones: Left total hip arthroplasty has been performed. Severe degenerative changes are seen within the right hip. Osseous structures are diffusely osteopenic. Osseous structures  are otherwise age appropriate. No acute bone abnormality. UPPER ABDOMEN: Liver: Severe hepatic steatosis. Gallbladder: Status post cholecystectomy. Bowel: Moderate sigmoid diverticulosis without superimposed acute inflammatory change. Appendix normal. The stomach, small bowel, and large bowel are otherwise unremarkable. Pelvis: Uterus absent. No adnexal masses. Vessels: Extensive aortoiliac atherosclerotic disease. RAF score: Aortic atherosclerosis (ICD-10-I70.0), emphysema (ICD-10-J43.9), aortic aneurysm (ICD-10-I71.9). IMPRESSION: 1. Mild emphysema. 2. Enlarged central pulmonary arteries, consistent with pulmonary arterial hypertension. 3. Ascending aortic dilation measuring 4.0 cm in maximal diameter. Recommend annual imaging follow-up by CTA or MRA. 4. Moderate atherosclerotic calcification within the thoracic aorta. 5. Severe hepatic steatosis. 6. Moderate sigmoid diverticulosis without superimposed acute inflammatory change. 7. Ovoid, smooth margined soft tissue lesion within the right paraspinal region measuring 2.5 cm along the right lateral aspect of the T9 vertebral body, unchanged from prior examination, likely representing a small neurogenic tumor such as a schwannoma or ganglioglioma. 8. Moderate fat-containing supraumbilical ventral hernia. Electronically signed by: Dorethia Molt MD 01/30/2024 02:04 AM EST RP Workstation: [id number]   CT  Head Wo Contrast Result Date: 01/30/2024 EXAM: CT HEAD WITHOUT CONTRAST 01/30/2024 01:42:01 AM TECHNIQUE: CT of the head was performed without the administration of intravenous contrast. Automated exposure control, iterative reconstruction, and/or weight based adjustment of the mA/kV was utilized to reduce the radiation dose to as low as reasonably achievable. COMPARISON: MRI dated 11/18/2023. CLINICAL HISTORY: Mental status change, unknown cause. FINDINGS: BRAIN AND VENTRICLES: No acute hemorrhage. No evidence of acute infarct. Patchy and confluent decreased attenuation throughout deep and periventricular white matter of cerebral hemispheres bilaterally, compatible with chronic microvascular ischemic disease. Chronic basal ganglia lacunar infarcts are again seen and stable. Cerebral ventricle sizes concordant with degree of cerebral volume loss. No extra-axial collection. No mass effect or midline shift. Atherosclerotic calcifications within cavernous internal carotid arteries. ORBITS: No acute abnormality. SINUSES: No acute abnormality. SOFT TISSUES AND SKULL: No acute soft tissue abnormality. No skull fracture. IMPRESSION: 1. Chronic atrophic and ischemic changes. No acute abnormality noted. Electronically signed by: Oneil Devonshire MD 01/30/2024 01:47 AM EST RP Workstation: [id number]   DG Chest Port 1 View Result Date: 01/29/2024 EXAM: 1 VIEW(S) XRAY OF THE CHEST 01/29/2024 11:42:25 PM COMPARISON: 01/14/2024 CLINICAL HISTORY: Possible sepsis. FINDINGS: LUNGS AND PLEURA: No focal pulmonary opacity. No pleural effusion. No pneumothorax. HEART AND MEDIASTINUM: Tortuous thoracic aorta with aortic atherosclerosis. BONES AND SOFT TISSUES: Multilevel degenerative changes of spine. IMPRESSION: 1. No acute cardiopulmonary abnormality. Electronically signed by: Oneil Devonshire MD 01/29/2024 11:46 PM EST RP Workstation: [id number]    EKG:   Independently reviewed.  Orders placed or performed during the hospital  encounter of 01/29/24   ED EKG   ED EKG   EKG 12-Lead   EKG 12-Lead   EKG 12-Lead   EKG   EKG   ---------------------------------------------------------------------------------------------------------------------------------------    Assessment / Plan:   Principal Problem:   Sepsis (HCC) Active Problems:   Mixed hyperlipidemia   Severe sepsis (HCC)   Hypotension   Acute kidney injury   PAD (peripheral artery disease)   Hypomagnesemia   Uncontrolled type 2 diabetes mellitus with hypoglycemia, without long-term current use of insulin  (HCC)   Hypokalemia   Gastroesophageal reflux disease   Generalized weakness   History of pulmonary embolism   Benign esophageal stricture   Acute on chronic diastolic (congestive) heart failure (HCC)   History of abdominal aortic aneurysm (AAA)   Assessment and Plan: * Sepsis [facility]) Patient meeting sepsis criteria, with unknown etiology -UA clear, imaging which includes CT of the  head, CT chest abdomen pelvis no obvious abscess infection sites -Pronounced lactic acidosis -Sepsis protocol, initiated aggressive IV fluid resuscitation, broad-spectrum antibiotics -with goal to narrow down over next 24-48 hours -Will follow with cultures   Severe lactic acidosis  Underlying infection, treated with presumed sepsis with unknown etiology may be exacerbated by severe dehydration, prolonged downtime -Continue aggressive fluid resuscitation - Continue IV fluids  - Lactic acid: 7.2---  0.5 >> 7.4       Toxic metabolic encephalopathy - Possibly due to sepsis - CT of the head negative - Patient is not much responsive withdraws to pain, opens her eyes Does not follow with any verbal command -Ordering MRI of the brain - Continue with neurochecks -  NPO.   Acute CVA CT of the head-negative Addendum: MRI of the brain:  1. Diffuse cerebral atrophy 2. Acute lacunar infarct in the right-side of the cerebellum Neurology consulted-appreciate  further evaluation recommendations -Continue with neurowork-up   acute on chronic heart failure -Elevated BNP 4016.0, -Echo:  - Last TTE on 08/2023 shows EF 55-60%, Moderate LVH, and G1DD  - Strict I&O, daily weights - Maintain K+ > 4.0, Mag > 2.0 - Unfortunately needing IV fluid now -Monitoring I's and O's, daily weight -Once stable resuming diuretics,    Hypokalemia/hypomagnesemia - Serum potassium 3.2, magnesium  1.4 - Replating IV for now - Monitoring closely  Hypernatremia -Likely due to dehydration, serum sodium 158 -continue with IV fluids, monitoring electrolytes closely     Diabetes mellitus type 2 CBG (last 3)  Recent Labs    01/30/24 0800  GLUCAP 80    - Well-controlled, last A1c 4.8% 5 months ago - SSI with meals, CBG monitoring    POA hypertensive - w h/o HTN POA BP 83/64 >>> improving to 128/87 -Holding BP meds for now  Hx of pulmonary embolism - Continue Eliquis    AAA - CTA chest in Nov 2025 showed a 4.1 cm ascending aortic aneurysm - Needs annual imaging follow-up by CTA or MRA   PAD - Continue cilostazol    Hyperlipidemia -Continue pravastatin   GERD -with history of esophageal dysmotility benign stenosis - Continue PPI -Advancing diet as tolerated     Consults called: Critical care team -------------------------------------------------------------------------------------------------------------------------------------------- DVT prophylaxis:  heparin  injection 5,000 Units Start: 01/31/24 0600 SCDs Start: 01/30/24 0734 Place TED hose Start: 01/30/24 0734   Code Status:   Code Status: Full Code   Admission status: Patient will be admitted as Inpatient, with a greater than 2 midnight length of stay. Level of care: Progressive   Family Communication: Called his son [name] [phone] (The above findings and plan of care has been discussed with patient in detail, the patient expressed understanding and agreement of above plan)   --------------------------------------------------------------------------------------------------------------------------------------------------  Disposition Plan:  Anticipated 1-2 days Status is: Inpatient Remains inpatient appropriate because: Meeting severe sepsis criteria, with lactic acidosis     ----------------------------------------------------------------------------------------------------------------------------------------------------  Critical care time spent:  49  Min.  Was spent seeing and evaluating the patient, reviewing all medical records, drawn plan of care.  SIGNED: Adriana DELENA Grams, MD, FHM. FAAFP. [place] [facility], Pager  (Please use [email or web address] to page/ or secure chat through epic) If 7PM-7AM, please contact night-coverage [email or web address],  01/30/2024, 10:20 AM     [1] No Known Allergies  "

## 2024-01-30 NOTE — Hospital Course (Addendum)
 Angie Carr is a 69 year old female with extensive history of PE on Eliquis , DM2, HTN, DJD, chronic anemia, diastolic HFpEF- 55-60%, GERD, esophageal stenosis AAA, PAD, HLD .SABRASABRA  Presents today via EMS for altered mental status and lethargy getting worse over the last 4 days.  Poor historian,-history not clear  Called the family multiple times with no answer Finally found his son [name] with the phone number of [phone] Stating that she has not been responsive and task 3-4 days laying in bed with poor p.o. intake, minimum interaction, may have fallen from her bed once or twice. Since she was laying in bed not doing anything, not talking or eating.  Cannot recall her having fever, having nausea or vomiting.  Cannot not tell us  if she had any asymmetric weaknesses. Became progressively unresponsive therefore EMS was called   ED Evaluation: Blood pressure 128/87, pulse (!) 103, temperature 98 F (36.7 C), temperature source Oral, resp. rate 17, height 5' 4 (1.626 m), weight 64.8 kg, SpO2 100%.  LABs: Na+ 158, K3.3, Chl; 114, CO2 15, BUN 40, creatinine 1.77, calcium  7.6,, WBC 8.6, AST 298, ALT 290, T. bili 1.5, Lactic acid 7.2 -6.5 >> 7.4

## 2024-01-31 ENCOUNTER — Inpatient Hospital Stay (HOSPITAL_COMMUNITY)

## 2024-01-31 DIAGNOSIS — E8729 Other acidosis: Secondary | ICD-10-CM | POA: Diagnosis not present

## 2024-01-31 DIAGNOSIS — I5033 Acute on chronic diastolic (congestive) heart failure: Secondary | ICD-10-CM

## 2024-01-31 DIAGNOSIS — I639 Cerebral infarction, unspecified: Secondary | ICD-10-CM | POA: Diagnosis not present

## 2024-01-31 DIAGNOSIS — F1210 Cannabis abuse, uncomplicated: Secondary | ICD-10-CM

## 2024-01-31 DIAGNOSIS — I6381 Other cerebral infarction due to occlusion or stenosis of small artery: Secondary | ICD-10-CM

## 2024-01-31 DIAGNOSIS — E785 Hyperlipidemia, unspecified: Secondary | ICD-10-CM | POA: Diagnosis not present

## 2024-01-31 DIAGNOSIS — F17210 Nicotine dependence, cigarettes, uncomplicated: Secondary | ICD-10-CM

## 2024-01-31 DIAGNOSIS — I69391 Dysphagia following cerebral infarction: Secondary | ICD-10-CM | POA: Diagnosis not present

## 2024-01-31 DIAGNOSIS — R6521 Severe sepsis with septic shock: Secondary | ICD-10-CM | POA: Diagnosis not present

## 2024-01-31 DIAGNOSIS — E860 Dehydration: Secondary | ICD-10-CM

## 2024-01-31 DIAGNOSIS — I10 Essential (primary) hypertension: Secondary | ICD-10-CM

## 2024-01-31 DIAGNOSIS — R6520 Severe sepsis without septic shock: Secondary | ICD-10-CM | POA: Diagnosis not present

## 2024-01-31 DIAGNOSIS — L8990 Pressure ulcer of unspecified site, unspecified stage: Secondary | ICD-10-CM | POA: Insufficient documentation

## 2024-01-31 DIAGNOSIS — I272 Pulmonary hypertension, unspecified: Secondary | ICD-10-CM | POA: Diagnosis not present

## 2024-01-31 DIAGNOSIS — Z7984 Long term (current) use of oral hypoglycemic drugs: Secondary | ICD-10-CM

## 2024-01-31 DIAGNOSIS — G9341 Metabolic encephalopathy: Secondary | ICD-10-CM | POA: Diagnosis not present

## 2024-01-31 DIAGNOSIS — Z7901 Long term (current) use of anticoagulants: Secondary | ICD-10-CM | POA: Diagnosis not present

## 2024-01-31 DIAGNOSIS — E872 Acidosis, unspecified: Secondary | ICD-10-CM

## 2024-01-31 DIAGNOSIS — N179 Acute kidney failure, unspecified: Secondary | ICD-10-CM | POA: Diagnosis not present

## 2024-01-31 DIAGNOSIS — E1151 Type 2 diabetes mellitus with diabetic peripheral angiopathy without gangrene: Secondary | ICD-10-CM

## 2024-01-31 DIAGNOSIS — A419 Sepsis, unspecified organism: Secondary | ICD-10-CM | POA: Diagnosis not present

## 2024-01-31 DIAGNOSIS — R29709 NIHSS score 9: Secondary | ICD-10-CM | POA: Diagnosis not present

## 2024-01-31 DIAGNOSIS — I63541 Cerebral infarction due to unspecified occlusion or stenosis of right cerebellar artery: Secondary | ICD-10-CM | POA: Diagnosis not present

## 2024-01-31 LAB — PHOSPHORUS: Phosphorus: 2.2 mg/dL — ABNORMAL LOW (ref 2.5–4.6)

## 2024-01-31 LAB — BASIC METABOLIC PANEL WITH GFR
Anion gap: 15 (ref 5–15)
BUN: 38 mg/dL — ABNORMAL HIGH (ref 8–23)
CO2: 20 mmol/L — ABNORMAL LOW (ref 22–32)
Calcium: 6.5 mg/dL — ABNORMAL LOW (ref 8.9–10.3)
Chloride: 117 mmol/L — ABNORMAL HIGH (ref 98–111)
Creatinine, Ser: 1.49 mg/dL — ABNORMAL HIGH (ref 0.44–1.00)
GFR, Estimated: 38 mL/min — ABNORMAL LOW
Glucose, Bld: 149 mg/dL — ABNORMAL HIGH (ref 70–99)
Potassium: 3.7 mmol/L (ref 3.5–5.1)
Sodium: 152 mmol/L — ABNORMAL HIGH (ref 135–145)

## 2024-01-31 LAB — HEPATIC FUNCTION PANEL
ALT: 181 U/L — ABNORMAL HIGH (ref 0–44)
AST: 118 U/L — ABNORMAL HIGH (ref 15–41)
Albumin: 2.3 g/dL — ABNORMAL LOW (ref 3.5–5.0)
Alkaline Phosphatase: 86 U/L (ref 38–126)
Bilirubin, Direct: 0.4 mg/dL — ABNORMAL HIGH (ref 0.0–0.2)
Indirect Bilirubin: 0.5 mg/dL (ref 0.3–0.9)
Total Bilirubin: 0.8 mg/dL (ref 0.0–1.2)
Total Protein: 4.5 g/dL — ABNORMAL LOW (ref 6.5–8.1)

## 2024-01-31 LAB — CBC
HCT: 27 % — ABNORMAL LOW (ref 36.0–46.0)
Hemoglobin: 9.1 g/dL — ABNORMAL LOW (ref 12.0–15.0)
MCH: 33.5 pg (ref 26.0–34.0)
MCHC: 33.7 g/dL (ref 30.0–36.0)
MCV: 99.3 fL (ref 80.0–100.0)
Platelets: 130 10*3/uL — ABNORMAL LOW (ref 150–400)
RBC: 2.72 MIL/uL — ABNORMAL LOW (ref 3.87–5.11)
RDW: 21.9 % — ABNORMAL HIGH (ref 11.5–15.5)
WBC: 10.9 10*3/uL — ABNORMAL HIGH (ref 4.0–10.5)
nRBC: 0.4 % — ABNORMAL HIGH (ref 0.0–0.2)

## 2024-01-31 LAB — ECHOCARDIOGRAM COMPLETE
Area-P 1/2: 5.13 cm2
Height: 64 in
P 1/2 time: 106 ms
S' Lateral: 2.3 cm
Weight: 2292.78 [oz_av]

## 2024-01-31 LAB — GLUCOSE, CAPILLARY
Glucose-Capillary: 104 mg/dL — ABNORMAL HIGH (ref 70–99)
Glucose-Capillary: 116 mg/dL — ABNORMAL HIGH (ref 70–99)
Glucose-Capillary: 136 mg/dL — ABNORMAL HIGH (ref 70–99)
Glucose-Capillary: 150 mg/dL — ABNORMAL HIGH (ref 70–99)
Glucose-Capillary: 166 mg/dL — ABNORMAL HIGH (ref 70–99)

## 2024-01-31 LAB — BLOOD GAS, VENOUS
Acid-base deficit: 4.8 mmol/L — ABNORMAL HIGH (ref 0.0–2.0)
Bicarbonate: 20.5 mmol/L (ref 20.0–28.0)
O2 Saturation: 44.4 %
Patient temperature: 36.3
pCO2, Ven: 37 mmHg — ABNORMAL LOW (ref 44–60)
pH, Ven: 7.35 (ref 7.25–7.43)
pO2, Ven: <31 mmHg — CL (ref 32–45)

## 2024-01-31 LAB — SALICYLATE LEVEL: Salicylate Lvl: <7 mg/dL — ABNORMAL LOW (ref 7.0–30.0)

## 2024-01-31 LAB — LACTIC ACID, PLASMA
Lactic Acid, Venous: 2.6 mmol/L (ref 0.5–1.9)
Lactic Acid, Venous: 3.1 mmol/L (ref 0.5–1.9)

## 2024-01-31 LAB — LEGIONELLA PNEUMOPHILA SEROGP 1 UR AG: L. pneumophila Serogp 1 Ur Ag: NEGATIVE

## 2024-01-31 LAB — MAGNESIUM: Magnesium: 1.7 mg/dL (ref 1.7–2.4)

## 2024-01-31 LAB — STREP PNEUMONIAE URINARY ANTIGEN: Strep Pneumo Urinary Antigen: NEGATIVE

## 2024-01-31 MED ORDER — ASPIRIN 81 MG PO TBEC
81.0000 mg | DELAYED_RELEASE_TABLET | Freq: Every day | ORAL | Status: DC
  Administered 2024-01-31 – 2024-02-04 (×5): 81 mg via ORAL
  Filled 2024-01-31 (×5): qty 1

## 2024-01-31 MED ORDER — INSULIN ASPART 100 UNIT/ML IJ SOLN
0.0000 [IU] | INTRAMUSCULAR | Status: DC
  Administered 2024-01-31 – 2024-02-01 (×3): 1 [IU] via SUBCUTANEOUS
  Filled 2024-01-31 (×4): qty 1
  Filled 2024-01-31: qty 4

## 2024-01-31 MED ORDER — LACTATED RINGERS IV BOLUS
1000.0000 mL | Freq: Once | INTRAVENOUS | Status: AC
  Administered 2024-01-31: 1000 mL via INTRAVENOUS

## 2024-01-31 MED ORDER — POTASSIUM CHLORIDE 10 MEQ/100ML IV SOLN
10.0000 meq | INTRAVENOUS | Status: AC
  Administered 2024-01-31 (×2): 10 meq via INTRAVENOUS
  Filled 2024-01-31 (×2): qty 100

## 2024-01-31 MED ORDER — ENOXAPARIN SODIUM 80 MG/0.8ML IJ SOSY
65.0000 mg | PREFILLED_SYRINGE | INTRAMUSCULAR | Status: DC
  Administered 2024-01-31 – 2024-02-03 (×4): 65 mg via SUBCUTANEOUS
  Filled 2024-01-31 (×5): qty 0.65

## 2024-01-31 MED ORDER — POTASSIUM PHOSPHATES 15 MMOLE/5ML IV SOLN
15.0000 mmol | Freq: Once | INTRAVENOUS | Status: AC
  Administered 2024-01-31: 15 mmol via INTRAVENOUS
  Filled 2024-01-31: qty 5

## 2024-01-31 MED ORDER — DEXTROSE-SODIUM CHLORIDE 5-0.45 % IV SOLN: INTRAVENOUS | Status: DC

## 2024-01-31 MED ORDER — CALCIUM GLUCONATE-NACL 1-0.675 GM/50ML-% IV SOLN
1.0000 g | Freq: Once | INTRAVENOUS | Status: AC
  Administered 2024-01-31: 1000 mg via INTRAVENOUS
  Filled 2024-01-31: qty 50

## 2024-01-31 MED ORDER — MAGNESIUM SULFATE 2 GM/50ML IV SOLN
2.0000 g | Freq: Once | INTRAVENOUS | Status: AC
  Administered 2024-01-31: 2 g via INTRAVENOUS
  Filled 2024-01-31: qty 50

## 2024-01-31 NOTE — Progress Notes (Signed)
 PT Cancellation Note  Patient Details Name: Angie Carr MRN: [id number] DOB: December 03, 1955   Cancelled Treatment:    Reason Eval/Treat Not Completed: Pain limiting ability to participate;Other (comment) (Per RN pt pain is significant and not able to participate in physical therapy at this time. GOC meeting planned for later today. Will follow up as able and appropriate.)  Dorothyann Maier, DPT, CLT  Acute Rehabilitation Services Office: [phone] (Secure chat preferred)   Dorothyann VEAR Maier 01/31/2024, 11:34 AM

## 2024-01-31 NOTE — Progress Notes (Signed)
 [facility] ADULT ICU REPLACEMENT PROTOCOL   The patient does apply for the [facility] Adult ICU Electrolyte Replacment Protocol based on the criteria listed below:   1.Exclusion criteria: TCTS, ECMO, Dialysis, and Myasthenia Gravis patients 2. Is GFR >/= 30 ml/min? Yes.    Patient's GFR today is 38 3. Is SCr </= 2? Yes.   Patient's SCr is 38 mg/dL 4. Did SCr increase >/= 0.5 in 24 hours? No. 5.Pt's weight >40kg  Yes.   6. Abnormal electrolyte(s): potassium 3.7, phos 2.2, mag 1.7  7. Electrolytes replaced per protocol 8.  Call MD STAT for K+ </= 2.5, Phos </= 1, or Mag </= 1 Physician:  protocol  Claretta JINNY Sharps 01/31/2024 5:50 AM

## 2024-01-31 NOTE — Progress Notes (Signed)
 "  NAME:  Angie Carr, MRN:  [id number], DOB:  Feb 12, 1955, LOS: 0 ADMISSION DATE:  01/29/2024, CONSULTATION DATE:  01/30/24 REFERRING MD:  Dr. Adriana Grams, CHIEF COMPLAINT:  Sepsis   History of Present Illness:  Angie Carr is a 69 year old female with a past medical history of hypertension, DM type II not on long term insulin , GERD, Pulmonary embolism (08/28/23) on Eliquis , chronic diastolic congestive heart failure (on GDMT), PAD on Pletal , dysphagia with EGD (11/20/23) with esophageal balloon dilation, morbid obesity and anxiety who presented to [facility] on 1/28 via EMS for altered mental status and progressive/worsening lethargy over 4 days.  EMS reports fever during transport.    Admitted to the hospitalist service with sepsis.  Discussed with Son and family member over the telephone regarding current status on 1/29, reports that Angie Carr has not felt well over the past few days and has not eaten or taken in fluids consistently for at least four days.  Family reports incontinence of stool and medications found on the floor concerning for missed medications for an unknown period of time.  Angie Carr lives at home with son and grandson a nurse care aid comes to the house daily for four hours to assist with ADLs.    In ED: Received Cefepime , Flagyl , and Vancomycin .  In total, given 1000ml of NS and 1000ml of LR, continued on LR infusion at 125cc/hr.  10meq of Potassium.     PCCM Consulted 1/29 for evaluation of sepsis and altered mental status.  Pertinent  Medical History   nxiety      Asthma      pt denies   Diabetes mellitus without complication (HCC)     DJD (degenerative joint disease)      lower back   GERD (gastroesophageal reflux disease)     Headache      miagraines, none in years   Hypertension     LBP (low back pain)     Morbid obesity (HCC)     Neuromuscular disorder (HCC)      feet tingle and her balance is sometimes a problem   Primary  osteoarthritis of both knees     Smoker             Significant Hospital Events: Including procedures, antibiotic start and stop dates in addition to other pertinent events   1/29 admitted to hospitalist for sepsis, hyperlactatemia Admitted to ICU for worsening encephalopathy and closer monitoring. PCCM consulted  Interim History / Subjective:  Alert and interacting at bedside. Endorses abdominal pain and lower extremity pain. Perseverates on wanting to be sitting up despite being in that position. Denies shortness of breath, hunger, or overt pain.   Objective    Blood pressure (!) 116/106, pulse 87, temperature (!) 97.3 F (36.3 C), temperature source Axillary, resp. rate 19, height 5' 4 (1.626 m), weight 65 kg, SpO2 96%.        Intake/Output Summary (Last 24 hours) at 01/31/2024 1411 Last data filed at 01/31/2024 1400 Gross per 24 hour  Intake 5036.6 ml  Output 235 ml  Net 4801.6 ml   Filed Weights   01/30/24 0337 01/31/24 0425  Weight: 64.8 kg 65 kg    Examination: General: thin elderly woman laying in bed in NAD HENT: mm membranes.  Lungs: Clear to auscultation anterolaterally Cardiovascular: RRR Abdomen: flat, soft, tender to deep palpation Extremities: dry, moving voluntarily. R>L knee enlargement without point tenderness. Increased tenderness with light touch to feet. Bandages  over heels removed; no purulence or fluctuance noted Neuro: alert and oriented to self and place. Following directions. GU: rectal pouch with liquid brown stool noted  Resolved problem list   Assessment and Plan   Acute Metabolic Encephalopathy  Dehydration CT head 1/29 and without acute intracranial abnormality  - aggressive fluid resuscitation  R cerebellar infarct - neurology following - TTE ordered - MRA head and Carotid US   - Continue Eliquis  - SLP, PT, OT - DNR limited  Lactic acidosis Septic shock  Hyperchloremic hypernatremia due to esophageal dysmotility Unclear  infectious source. Continues on broad abx. Unclear reason for lactic acidosis; improving. Checking salycilate level as patient with resp alkalosis, primary, and HAGMA. - monitor electrolytes - follow lactic acid - if persistent acute diarrhea and abdominal pain, query of mesenteric ischemia  Hx of PE, 08/28/2023  - on anticoagulation  Pulmonary arterial hypertension On RA  Ascending aortic dilation measuring 4.0 cm  Will need annual follow up by CTA or MRA  R knee pain with swelling - CT R knee per primary team  Other active problems by primary team  Patient is stable for transfer out of ICU and to floor for continued management under [facility] care  Labs   CBC: Recent Labs  Lab 01/30/24 0010 01/30/24 1030 01/30/24 1440 01/30/24 1701 01/31/24 0410  WBC 8.6  --  10.6* 12.3* 10.9*  NEUTROABS 7.0  --  9.1*  --   --   HGB 12.5 8.5* 9.2* 9.8* 9.1*  HCT 37.0 25.0* 27.2* 29.0* 27.0*  MCV 99.2  --  99.6 99.7 99.3  PLT 121*  --  127* 129* 130*    Basic Metabolic Panel: Recent Labs  Lab 01/30/24 0010 01/30/24 0735 01/30/24 1030 01/30/24 1439 01/30/24 1701 01/31/24 0410  NA 158* 158* 153* 154*  --  152*  K 3.2* 3.3* 2.8* 3.2*  --  3.7  CL 116* 114*  --  116*  --  117*  CO2 17* 15*  --  13*  --  20*  GLUCOSE 87 85  --  102*  --  149*  BUN 40* 40*  --  38*  --  38*  CREATININE 1.80* 1.77*  --  1.58* 1.49* 1.49*  CALCIUM  7.7* 7.6*  --  6.5*  --  6.5*  MG  --  1.4*  --   --   --  1.7  PHOS  --  3.9  --   --   --  2.2*   GFR: Estimated Creatinine Clearance: 30.8 mL/min (A) (by C-G formula based on SCr of 1.49 mg/dL (H)). Recent Labs  Lab 01/30/24 0010 01/30/24 0032 01/30/24 0551 01/30/24 0842 01/30/24 1407 01/30/24 1440 01/30/24 1701 01/30/24 2059 01/31/24 0410  WBC 8.6  --   --   --   --  10.6* 12.3*  --  10.9*  LATICACIDVEN  --    < > 6.5* 7.4* 8.1*  --   --  3.9*  --    < > = values in this interval not displayed.    Liver Function Tests: Recent  Labs  Lab 01/30/24 0010 01/30/24 0735 01/30/24 1439 01/31/24 0410  AST 292* 298* 212* 118*  ALT 286* 290* 216* 181*  ALKPHOS 214* 126 86 86  BILITOT 1.5* 1.5* 1.1 0.8  PROT 6.7 6.7 4.6* 4.5*  ALBUMIN 3.5 3.5 2.4* 2.3*   No results for input(s): LIPASE, AMYLASE in the last 168 hours. No results for input(s): AMMONIA in the last 168 hours.  ABG    Component Value Date/Time   PHART 7.460 (H) 01/30/2024 1030   PCO2ART 15.1 (LL) 01/30/2024 1030   PO2ART 164 (H) 01/30/2024 1030   HCO3 20.5 01/31/2024 1226   TCO2 11 (L) 01/30/2024 1030   ACIDBASEDEF 4.8 (H) 01/31/2024 1226   O2SAT 44.4 01/31/2024 1226     Coagulation Profile: Recent Labs  Lab 01/30/24 0010 01/30/24 1701  INR 1.1 1.3*    Cardiac Enzymes: Recent Labs  Lab 01/30/24 0110  CKTOTAL 48    HbA1C: Hgb A1c MFr Bld  Date/Time Value Ref Range Status  01/30/2024 02:39 PM 4.6 (L) 4.8 - 5.6 % Final    Comment:    (NOTE) Diagnosis of Diabetes The following HbA1c ranges recommended by the American Diabetes Association (ADA) may be used as an aid in the diagnosis of diabetes mellitus.  Hemoglobin             Suggested A1C NGSP%              Diagnosis  <5.7                   Non Diabetic  5.7-6.4                Pre-Diabetic  >6.4                   Diabetic  <7.0                   Glycemic control for                       adults with diabetes.    07/19/2023 10:07 AM 4.8 4.8 - 5.6 % Final    Comment:    (NOTE) Diagnosis of Diabetes The following HbA1c ranges recommended by the American Diabetes Association (ADA) may be used as an aid in the diagnosis of diabetes mellitus.  Hemoglobin             Suggested A1C NGSP%              Diagnosis  <5.7                   Non Diabetic  5.7-6.4                Pre-Diabetic  >6.4                   Diabetic  <7.0                   Glycemic control for                       adults with diabetes.      CBG: Recent Labs  Lab 01/30/24 1402  01/30/24 1606 01/30/24 1951 01/31/24 0840 01/31/24 1213  GLUCAP 106* 81 111* 150* 166*    Review of Systems:     Past Medical History:  She,  has a past medical history of Anemia, Anxiety, Asthma, Diabetes mellitus without complication (HCC), DJD (degenerative joint disease), GERD (gastroesophageal reflux disease), Headache, Hypertension, LBP (low back pain), Morbid obesity (HCC), Neuromuscular disorder (HCC), Primary osteoarthritis of both knees, and Smoker.   Surgical History:   Past Surgical History:  Procedure Laterality Date   ABDOMINAL HYSTERECTOMY     complete   CHOLECYSTECTOMY     laparascopic   COLONOSCOPY N/A 02/11/2014   Procedure: COLONOSCOPY;  Surgeon: Lamar Donnald GAILS,  MD;  Location: [facility] ENDOSCOPY;  Service: Endoscopy;  Laterality: N/A;   ESOPHAGOGASTRODUODENOSCOPY N/A 11/20/2023   Procedure: EGD (ESOPHAGOGASTRODUODENOSCOPY);  Surgeon: San Sandor GAILS, DO;  Location: [facility] ENDOSCOPY;  Service: Gastroenterology;  Laterality: N/A;  with possible dilation   HERNIA REPAIR     ventral on left side.   TOTAL HIP ARTHROPLASTY Left 07/24/2023   Procedure: ARTHROPLASTY, HIP, TOTAL,POSTERIOR APPROACH;  Surgeon: Edna Toribio LABOR, MD;  Location: [facility] ORS;  Service: Orthopedics;  Laterality: Left;   TUBAL LIGATION       Social History:   reports that she has been smoking cigarettes. She has never used smokeless tobacco. She reports current drug use. Drug: Marijuana. She reports that she does not drink alcohol .   Family History:  Her family history includes Hypertension in an other family member; Stroke in an other family member.   Allergies Allergies[1]   Home Medications  Prior to Admission medications  Medication Sig Start Date End Date Taking? Authorizing Provider  amLODipine  (NORVASC ) 10 MG tablet Take 10 mg by mouth daily.   Yes [provider]  cilostazol  (PLETAL ) 50 MG tablet Take 50 mg by mouth 2 (two) times daily.   Yes [provider]   Empagliflozin-linaGLIPtin (GLYXAMBI) 25-5 MG TABS Take 1 tablet by mouth daily.   Yes [provider]  fesoterodine  (TOVIAZ ) 4 MG TB24 tablet Take 4 mg by mouth daily.   Yes [provider]  irbesartan-hydrochlorothiazide  (AVALIDE) 300-12.5 MG tablet Take 1 tablet by mouth daily.   Yes [provider]  metFORMIN  (GLUCOPHAGE ) 1000 MG tablet Take 1,000 mg by mouth 2 (two) times daily with a meal.   Yes [provider]  metoprolol  tartrate (LOPRESSOR ) 25 MG tablet Take 0.5 tablets (12.5 mg total) by mouth 2 (two) times daily. 01/18/24  Yes Fairy Frames, MD  omeprazole  (PRILOSEC) 20 MG capsule Take 20 mg by mouth daily.   Yes [provider]  Potassium Chloride  ER 20 MEQ TBCR Take 20 mEq by mouth daily.   Yes [provider]  pravastatin  (PRAVACHOL ) 40 MG tablet Take 40 mg by mouth every evening.   Yes [provider]  apixaban  (ELIQUIS ) 5 MG TABS tablet Take 5 mg by mouth 2 (two) times daily. Patient not taking: Reported on 01/30/2024    [provider]  pantoprazole  (PROTONIX ) 40 MG tablet Take 1 tablet (40 mg total) by mouth daily. 01/18/24   Fairy Frames, MD  torsemide  (DEMADEX ) 20 MG tablet Take 1 tablet (20 mg total) by mouth daily. Patient not taking: Reported on 01/30/2024 01/18/24   Fairy Frames, MD     Critical care time:               [1] No Known Allergies  "

## 2024-01-31 NOTE — Progress Notes (Signed)
 PHARMACY - ANTICOAGULATION CONSULT NOTE  Pharmacy Consult for Lovenox  Indication: h/o PE (08/2023)  Allergies[1]  Patient Measurements: Height: 5' 4 (162.6 cm) Weight: 65 kg (143 lb 4.8 oz) IBW/kg (Calculated) : 54.7 HEPARIN  DW (KG): 64.8  Vital Signs: Temp: 97.3 F (36.3 C) (01/30 1215) Temp Source: Axillary (01/30 1215) BP: 106/65 (01/30 1630) Pulse Rate: 82 (01/30 1630)  Labs: Recent Labs    01/30/24 0010 01/30/24 0110 01/30/24 0735 01/30/24 1439 01/30/24 1440 01/30/24 1701 01/31/24 0410  HGB 12.5  --    < >  --  9.2* 9.8* 9.1*  HCT 37.0  --    < >  --  27.2* 29.0* 27.0*  PLT 121*  --   --   --  127* 129* 130*  APTT  --   --   --   --   --  30  --   LABPROT 15.2  --   --   --   --  17.1*  --   INR 1.1  --   --   --   --  1.3*  --   CREATININE 1.80*  --    < > 1.58*  --  1.49* 1.49*  CKTOTAL  --  48  --   --   --   --   --    < > = values in this interval not displayed.    Estimated Creatinine Clearance: 30.8 mL/min (A) (by C-G formula based on SCr of 1.49 mg/dL (H)).   Medical History: Past Medical History:  Diagnosis Date   Anemia    Anxiety    Asthma    pt denies   Diabetes mellitus without complication (HCC)    DJD (degenerative joint disease)    lower back   GERD (gastroesophageal reflux disease)    Headache    miagraines, none in years   Hypertension    LBP (low back pain)    Morbid obesity (HCC)    Neuromuscular disorder (HCC)    feet tingle and her balance is sometimes a problem   Primary osteoarthritis of both knees    Smoker     Medications:  Medications Prior to Admission  Medication Sig Dispense Refill Last Dose/Taking   amLODipine  (NORVASC ) 10 MG tablet Take 10 mg by mouth daily.   Unknown   cilostazol  (PLETAL ) 50 MG tablet Take 50 mg by mouth 2 (two) times daily.   Unknown   Empagliflozin-linaGLIPtin (GLYXAMBI) 25-5 MG TABS Take 1 tablet by mouth daily.   Unknown   fesoterodine  (TOVIAZ ) 4 MG TB24 tablet Take 4 mg by mouth daily.    Unknown   irbesartan-hydrochlorothiazide  (AVALIDE) 300-12.5 MG tablet Take 1 tablet by mouth daily.   Unknown   metFORMIN  (GLUCOPHAGE ) 1000 MG tablet Take 1,000 mg by mouth 2 (two) times daily with a meal.   Unknown   metoprolol  tartrate (LOPRESSOR ) 25 MG tablet Take 0.5 tablets (12.5 mg total) by mouth 2 (two) times daily. 60 tablet 0 Unknown   omeprazole  (PRILOSEC) 20 MG capsule Take 20 mg by mouth daily.   Unknown   Potassium Chloride  ER 20 MEQ TBCR Take 20 mEq by mouth daily.   Unknown   pravastatin  (PRAVACHOL ) 40 MG tablet Take 40 mg by mouth every evening.   Unknown   apixaban  (ELIQUIS ) 5 MG TABS tablet Take 5 mg by mouth 2 (two) times daily. (Patient not taking: Reported on 01/30/2024)   Not Taking   pantoprazole  (PROTONIX ) 40 MG tablet Take 1 tablet (40  mg total) by mouth daily. 30 tablet 0 Unknown   torsemide  (DEMADEX ) 20 MG tablet Take 1 tablet (20 mg total) by mouth daily. (Patient not taking: Reported on 01/30/2024) 30 tablet 0 Not Taking    Assessment: 69 y.o. F presents with AMS sue to severe electrolyte abnormalities and acute stroke. Pt has been on pta Eliquis  but looks like patient was holding after hospital d/c on 1/16 (hx of provoked PE after hip surgery 07/2023). Held this admission with poor po intake and no enteral access. Plan to start Lovenox  until pt can take po.  Goal of Therapy:  Anti-Xa level 0.6-1 units/ml 4hrs after LMWH dose given Monitor platelets by anticoagulation protocol: Yes   Plan:  Lovenox  65 mg SQ q24h F/u renal function CBC q72h while on Lovenox  F/u ability to restart apixaban  when able  Vito Ralph, PharmD, BCPS Please see amion for complete clinical pharmacist phone list 01/31/2024,4:46 PM      [1] No Known Allergies

## 2024-01-31 NOTE — Inpatient Diabetes Management (Addendum)
 Inpatient Diabetes Program Recommendations  AACE/ADA: New Consensus Statement on Inpatient Glycemic Control (2015)  Target Ranges:  Prepandial:   less than 140 mg/dL      Peak postprandial:   less than 180 mg/dL (1-2 hours)      Critically ill patients:  140 - 180 mg/dL   Lab Results  Component Value Date   GLUCAP 150 (H) 01/31/2024   HGBA1C 4.6 (L) 01/30/2024    Review of Glycemic Control  Latest Reference Range & Units 01/30/24 08:00 01/30/24 12:03 01/30/24 14:02 01/30/24 16:06 01/30/24 19:51 01/31/24 08:40  Glucose-Capillary 70 - 99 mg/dL 80 68 (L) 893 (H) 81 888 (H) 150 (H)   Diabetes history: DM  Outpatient Diabetes medications:  Metformin  1000 mg bid Current orders for Inpatient glycemic control:  Novolog  0-15 units tid with meals  Inpatient Diabetes Program Recommendations:   Consider changing Novolog  correction to sensitive 0-6 units q 4 hours.   Thanks,  Randall Bullocks, RN, BC-ADM Inpatient Diabetes Coordinator Pager [phone]  (8a-5p)

## 2024-01-31 NOTE — Evaluation (Signed)
 Clinical/Bedside Swallow Evaluation Patient Details  Name: Angie Carr MRN: [id number] Date of Birth: 1955-03-03  Today's Date: 01/31/2024 Time: SLP Start Time (ACUTE ONLY): 1030 SLP Stop Time (ACUTE ONLY): 1052 SLP Time Calculation (min) (ACUTE ONLY): 22 min  Past Medical History:  Past Medical History:  Diagnosis Date   Anemia    Anxiety    Asthma    pt denies   Diabetes mellitus without complication (HCC)    DJD (degenerative joint disease)    lower back   GERD (gastroesophageal reflux disease)    Headache    miagraines, none in years   Hypertension    LBP (low back pain)    Morbid obesity (HCC)    Neuromuscular disorder (HCC)    feet tingle and her balance is sometimes a problem   Primary osteoarthritis of both knees    Smoker    Past Surgical History:  Past Surgical History:  Procedure Laterality Date   ABDOMINAL HYSTERECTOMY     complete   CHOLECYSTECTOMY     laparascopic   COLONOSCOPY N/A 02/11/2014   Procedure: COLONOSCOPY;  Surgeon: Lamar Donnald GAILS, MD;  Location: [facility] ENDOSCOPY;  Service: Endoscopy;  Laterality: N/A;   ESOPHAGOGASTRODUODENOSCOPY N/A 11/20/2023   Procedure: EGD (ESOPHAGOGASTRODUODENOSCOPY);  Surgeon: San Sandor GAILS, DO;  Location: [facility] ENDOSCOPY;  Service: Gastroenterology;  Laterality: N/A;  with possible dilation   HERNIA REPAIR     ventral on left side.   TOTAL HIP ARTHROPLASTY Left 07/24/2023   Procedure: ARTHROPLASTY, HIP, TOTAL,POSTERIOR APPROACH;  Surgeon: Edna Toribio LABOR, MD;  Location: [facility] ORS;  Service: Orthopedics;  Laterality: Left;   TUBAL LIGATION     HPI:  Angie Carr is a 69 y.o. female with medical history significant for HTN, T2DM, PE on Eliquis , PAD, GERD, DJD, arthritis and anemia who presented to the ED for evaluation shortness of breath and leg swelling. Patient reports she has had progressive shortness of breath over the last month.  She reports associated lower extremity swelling and had a follow-up with  her PCP last week but was unable to make that appointment due to dyspnea on exertion. Reports that her cardiologist recently took her off her blood pressure meds due to persistently low blood pressure.  She denies any dizziness, headache, palpitations, falls, nausea, vomiting, abdominal pain or chest pain; ST consulted for swallow evaluation; pt currently NPO.    Assessment / Plan / Recommendation  Clinical Impression  Recommend initiating a conservative diet of Dysphagia 1(puree)/thin liquids d/t pt preference primarily and declination of solids during CSE.  ST will f/u for dysphagia tx/education during acute stay. Ms. Denes seen for a clinical swallow evaluation with encouragement/min verbal cues provided by SLP for consuming various consistencies.  Generalized oral weakness observed during OME and missing dentition (few natural dentition only), but mastication unable to be assessed d/t pt declining solids despite encouragement.  Slight anterior L labial loss, but this may be d/t head positioning as pt required repositioning to obtain neutral positioning. No overt s/s of aspiration noted during CSE cumulatively with ice chips, thin liquids with small volumes (including straw) and puree consistencies.  Family requested palliative consult and this was to be completed this date. ST will f/u for diet progression/dysphagia tx during acute stay.  Thank you for this consult.   SLP Visit Diagnosis: Dysphagia, unspecified (R13.10)    Aspiration Risk  Mild aspiration risk    Diet Recommendation   Thin;Dysphagia 1 (puree)  Medication Administration: Whole meds with liquid (as  tolerated or whole/puree)    Other Recommendations Oral Care Recommendations: Oral care BID     Swallow Evaluation Recommendations  PO diet (Dysphagia 1(puree)/thin liquids   Assistance Recommended at Discharge  TBD  Functional Status Assessment Patient has had a recent decline in their functional status and demonstrates the  ability to make significant improvements in function in a reasonable and predictable amount of time.  Frequency and Duration min 2x/week  1 week       Prognosis Prognosis for improved oropharyngeal function: Good      Swallow Study   General Date of Onset: 01/29/24 HPI: Angie Carr is a 69 y.o. female with medical history significant for HTN, T2DM, PE on Eliquis , PAD, GERD, DJD, arthritis and anemia who presented to the ED for evaluation shortness of breath and leg swelling. Patient reports she has had progressive shortness of breath over the last month.  She reports associated lower extremity swelling and had a follow-up with her PCP last week but was unable to make that appointment due to dyspnea on exertion. Reports that her cardiologist recently took her off her blood pressure meds due to persistently low blood pressure.  She denies any dizziness, headache, palpitations, falls, nausea, vomiting, abdominal pain or chest pain; ST consulted for swallow evaluation; pt currently NPO. Type of Study: Bedside Swallow Evaluation Previous Swallow Assessment: 11/16/23 rec'd Regular/thin; esophageal dysphagia suspected Diet Prior to this Study: NPO Temperature Spikes Noted: No Respiratory Status: Room air History of Recent Intubation: No Behavior/Cognition: Alert;Cooperative Oral Cavity Assessment: Within Functional Limits Oral Care Completed by SLP: Yes Oral Cavity - Dentition: Missing dentition;Other (Comment) (few natural dentition) Vision: Functional for self-feeding Self-Feeding Abilities: Needs assist Patient Positioning: Upright in bed;Other (comment) (head leaning to left, but repositioned to neutral positioning prior to po intake) Baseline Vocal Quality: Hoarse Volitional Cough: Strong Volitional Swallow: Able to elicit    Oral/Motor/Sensory Function Overall Oral Motor/Sensory Function: Generalized oral weakness   Ice Chips Ice chips: Impaired Presentation: Spoon Oral Phase  Functional Implications: Left anterior spillage;Other (comment) (due to head positioning, but able to consume with A)   Thin Liquid Thin Liquid: Within functional limits Presentation: Spoon;Straw (small sips)    Nectar Thick Nectar Thick Liquid: Not tested   Honey Thick Honey Thick Liquid: Not tested   Puree Puree: Within functional limits Presentation: Spoon   Solid     Solid: Not tested (pt declined despite encouragement)      Pat Vicki Chaffin,M.S.,CCC-SLP 01/31/2024,11:07 AM

## 2024-01-31 NOTE — Progress Notes (Signed)
 Patient had multiple bottles of prescription medication, a phone, and a smart watch, at the bedside in patient belongings bag. Patient's Granddaughters, Andres and Martie agreed to take possession and took these items home.

## 2024-01-31 NOTE — Progress Notes (Addendum)
 STROKE TEAM PROGRESS NOTE    SIGNIFICANT HOSPITAL EVENTS  1/28: presented with AMS and lethargy after 4 days of poor PO intake. Admitted with sepsis, electrolyte derangement, and acute metabolic encephalopathy. MRI: acute lacunar infarct of right side of cerebellum.  CT A/P: severe hepatic steatosis and stable 2.5 cm ovoid lesion in the paraspinal lesion along the right lateral aspect of T9 vertebral body.   INTERIM HISTORY/SUBJECTIVE  Family at bedside (granddaughter). Endorses weeks of patient not eating, multiple episodes of n/v/d and likelihood of not correctly taking medication. Patient lives at home with son (who is in a wheelchair and unable to care for her) and her grandson. Per granddaughter, patient uses a cane and is usually able to get around without issues at home, but has become increasingly weak over the past weeks with her decreased intake.  MRI shows a small infarct in the right lateral cerebellum which is clinically asymptomatic and unlikely to be related to the presentation OBJECTIVE  CBC    Component Value Date/Time   WBC 10.9 (H) 01/31/2024 0410   RBC 2.72 (L) 01/31/2024 0410   HGB 9.1 (L) 01/31/2024 0410   HCT 27.0 (L) 01/31/2024 0410   HCT 31.3 (L) 12/11/2014 2344   PLT 130 (L) 01/31/2024 0410   MCV 99.3 01/31/2024 0410   MCH 33.5 01/31/2024 0410   MCHC 33.7 01/31/2024 0410   RDW 21.9 (H) 01/31/2024 0410   LYMPHSABS 1.0 01/30/2024 1440   MONOABS 0.5 01/30/2024 1440   EOSABS 0.0 01/30/2024 1440   BASOSABS 0.0 01/30/2024 1440    BMET    Component Value Date/Time   NA 152 (H) 01/31/2024 0410   K 3.7 01/31/2024 0410   CL 117 (H) 01/31/2024 0410   CO2 20 (L) 01/31/2024 0410   GLUCOSE 149 (H) 01/31/2024 0410   BUN 38 (H) 01/31/2024 0410   CREATININE 1.49 (H) 01/31/2024 0410   CREATININE 0.69 11/04/2012 1142   CALCIUM  6.5 (L) 01/31/2024 0410   GFRNONAA 38 (L) 01/31/2024 0410    IMAGING past 24 hours ECHOCARDIOGRAM COMPLETE Result Date: 01/31/2024     ECHOCARDIOGRAM REPORT   Patient Name:   EDUARDO WURTH Date of Exam: 01/31/2024 Medical Rec #:  [id number]          Height:       64.0 in Accession #:    [id number]         Weight:       143.3 lb Date of Birth:  [birth year]          BSA:          1.698 m Patient Age:    68 years           BP:           98/58 mmHg Patient Gender: F                  HR:           82 bpm. Exam Location:  Inpatient Procedure: 2D Echo, Cardiac Doppler and Color Doppler (Both Spectral and Color            Flow Doppler were utilized during procedure). Indications:    CHF  History:        Patient has prior history of Echocardiogram examinations, most                 recent 01/15/2024. CHF, PAD, Signs/Symptoms:Hypotension; Risk  Factors:Diabetes and Dyslipidemia.  Sonographer:    Philomena Daring Referring Phys: [phone] SEYED A SHAHMEHDI IMPRESSIONS  1. Left ventricular ejection fraction, by estimation, is 60 to 65%. The left ventricle has normal function. The left ventricle has no regional wall motion abnormalities. There is moderate concentric left ventricular hypertrophy. Left ventricular diastolic parameters are consistent with Grade I diastolic dysfunction (impaired relaxation).  2. Right ventricular systolic function is normal. The right ventricular size is normal. There is normal pulmonary artery systolic pressure.  3. A small pericardial effusion is present. There is no evidence of cardiac tamponade.  4. The mitral valve is normal in structure. Trivial mitral valve regurgitation. No evidence of mitral stenosis.  5. The aortic valve is tricuspid. Aortic valve regurgitation is mild. Aortic valve sclerosis/calcification is present, without any evidence of aortic stenosis.  6. There is mild dilatation of the ascending aorta, measuring 41 mm.  7. The inferior vena cava is normal in size with <50% respiratory variability, suggesting right atrial pressure of 8 mmHg. Comparison(s): No significant change from prior study.  Conclusion(s)/Recommendation(s): Recommend annual imaging of the aorta via echo vs CT vs MRA for surveillance of aortic dilation. FINDINGS  Left Ventricle: Left ventricular ejection fraction, by estimation, is 60 to 65%. The left ventricle has normal function. The left ventricle has no regional wall motion abnormalities. The left ventricular internal cavity size was normal in size. There is  moderate concentric left ventricular hypertrophy. Left ventricular diastolic parameters are consistent with Grade I diastolic dysfunction (impaired relaxation). Right Ventricle: The right ventricular size is normal. No increase in right ventricular wall thickness. Right ventricular systolic function is normal. There is normal pulmonary artery systolic pressure. The tricuspid regurgitant velocity is 2.24 m/s, and  with an assumed right atrial pressure of 8 mmHg, the estimated right ventricular systolic pressure is 28.1 mmHg. Left Atrium: Left atrial size was normal in size. Right Atrium: Right atrial size was normal in size. Pericardium: A small pericardial effusion is present. There is no evidence of cardiac tamponade. Mitral Valve: The mitral valve is normal in structure. Mild to moderate mitral annular calcification. Trivial mitral valve regurgitation. No evidence of mitral valve stenosis. Tricuspid Valve: The tricuspid valve is normal in structure. Tricuspid valve regurgitation is mild . No evidence of tricuspid stenosis. Aortic Valve: The aortic valve is tricuspid. There is mild aortic valve annular calcification. Aortic valve regurgitation is mild. Aortic regurgitation PHT measures 106 msec. Aortic valve sclerosis/calcification is present, without any evidence of aortic  stenosis. Pulmonic Valve: The pulmonic valve was normal in structure. Pulmonic valve regurgitation is mild. No evidence of pulmonic stenosis. Aorta: The aortic root is normal in size and structure. There is mild dilatation of the ascending aorta, measuring  41 mm. Venous: The inferior vena cava is normal in size with less than 50% respiratory variability, suggesting right atrial pressure of 8 mmHg. IAS/Shunts: No atrial level shunt detected by color flow Doppler.  LEFT VENTRICLE PLAX 2D LVIDd:         3.50 cm   Diastology LVIDs:         2.30 cm   LV e' medial:    5.55 cm/s LV PW:         1.20 cm   LV E/e' medial:  17.7 LV IVS:        1.40 cm   LV e' lateral:   10.10 cm/s LVOT diam:     1.87 cm   LV E/e' lateral: 9.8 LV SV:  51 LV SV Index:   30 LVOT Area:     2.75 cm  RIGHT VENTRICLE             IVC RV Basal diam:  2.88 cm     IVC diam: 1.49 cm RV Mid diam:    1.94 cm RV S prime:     13.90 cm/s TAPSE (M-mode): 1.8 cm LEFT ATRIUM             Index        RIGHT ATRIUM           Index LA diam:        2.70 cm 1.59 cm/m   RA Area:     10.50 cm LA Vol (A2C):   23.4 ml 13.78 ml/m  RA Volume:   20.80 ml  12.25 ml/m LA Vol (A4C):   21.1 ml 12.43 ml/m LA Biplane Vol: 23.6 ml 13.90 ml/m  AORTIC VALVE LVOT Vmax:   94.90 cm/s LVOT Vmean:  62.900 cm/s LVOT VTI:    0.187 m AI PHT:      106 msec  AORTA Ao Root diam: 3.33 cm Ao Asc diam:  4.10 cm MITRAL VALVE                TRICUSPID VALVE MV Area (PHT): 5.13 cm     TR Peak grad:   20.1 mmHg MV Decel Time: 148 msec     TR Vmax:        224.00 cm/s MV E velocity: 98.50 cm/s MV A velocity: 122.00 cm/s  SHUNTS MV E/A ratio:  0.81         Systemic VTI:  0.19 m                             Systemic Diam: 1.87 cm Georganna Archer Electronically signed by Georganna Archer Signature Date/Time: 01/31/2024/1:11:14 PM    Final    MR ANGIO HEAD WO CONTRAST Result Date: 01/31/2024 CLINICAL DATA:  Follow-up examination for stroke. EXAM: MRA HEAD WITHOUT CONTRAST TECHNIQUE: Angiographic images of the Circle of Willis were acquired using MRA technique without intravenous contrast. COMPARISON:  Comparison made with MRI from earlier the same day. FINDINGS: Anterior circulation: Examination severely degraded by motion artifact, nearly  nondiagnostic. Both internal carotid arteries are grossly patent through the siphons to the termini. Evaluation for possible stenosis or other abnormality is severely limited on this exam. Right A1 grossly patent. Left A1 not well seen. Anterior communicating artery complex not adequately assessed on this exam. Partially visualized ACAs are grossly patent distally. M1 segments grossly patent bilaterally. MCA branches not adequately assessed on this exam. Posterior circulation: Left vertebral artery patent to the vertebrobasilar junction. Hypoplastic right vertebral artery not seen, likely occluded given appearance on prior brain MRI. Neither PICA of seen on this exam. Basilar grossly patent without visible stenosis. Superior cerebral arteries grossly patent at their origins. Both PCAs grossly patent as well. Anatomic variants: As above. Other: None. IMPRESSION: 1. Severely motion degraded and nearly nondiagnostic exam. 2. Gross patency of the anterior circulation. 3. Hypoplastic right vertebral artery not seen, likely occluded given appearance on prior brain MRI. Remainder of the posterior circulation is grossly patent. Electronically Signed   By: Morene Hoard M.D.   On: 01/31/2024 02:00    Vitals:   01/31/24 1430 01/31/24 1431 01/31/24 1437 01/31/24 1445  BP: (!) 75/49 (!) 75/46 (!) 72/43 (!) 88/55  Pulse: 86 84 85  81  Resp: 11 12 13  (!) 9  Temp:      TempSrc:      SpO2: 95% 96% 96% 96%  Weight:      Height:        PHYSICAL EXAM General: Acutely ill, poorly nourished elderly female who appears much older than age.   NEURO:  Mental Status: Drowsy, knows she is at [facility], knows name, age, year.  Speech/Language: speech is without aphasia.  Naming, repetition, fluency, and comprehension intact.  Cranial Nerves:  II: PERRL. Visual fields full.  III, IV, VI: EOMI. Eyelids elevate symmetrically.  V: Sensation is intact to light touch and symmetrical to face.  VII: Face is symmetrical  resting and smiling VIII: hearing intact to voice. IX, X: Palate elevates symmetrically. No dysarthria.  KP:Dynloizm shrug 5/5. XII: tongue is midline without fasciculations. Motor: Generalized weakness, greater in BLE. Tone: is normal and bulk is normal Sensation- Intact to light touch bilaterally. Extinction absent to light touch to DSS.   Coordination: FTN intact bilaterally, HKS: no ataxia in BLE. Gait- deferred  Most Recent NIH 9     ASSESSMENT/PLAN  Ms. AVIAN GREENAWALT is a 70 y.o. female with history of  with a hx of PE on Eliquis , DM2, HTN, DJD, chronic anemia, diastolic HFpEF- 55-60%, GERD, esophageal stenosis, AAA, PAD, HLD who initially presented to [facility] via EMS due to altered mental status and lethargy that had gotten progressively worse over the previous 4 days.  He was found to have a stroke on MRI and neurology was consulted for stroke work up. NIH on consult: 9.  Incidental Acute Ischemic Infarct:  right cerebellum Etiology:  likely secondary to sepsis  CT head  Chronic atrophic and ischemic changes.  MRI   Diffuse cerebral atrophy Acute lacunar infarct in the right-side of the cerebellum 2D Echo EF 60-65%, Trivial MVR, mild dilatation of ascending aorta LDL 30 HgbA1c 4.6 VTE prophylaxis -heparin  subcu Eliquis  5 mg twice daily prior to admission, now restart Eliquis  5 mg twice daily and add aspirin  daily when able to take PO. Can do ASA suppository if needed.  Therapy recommendations:  Pending Disposition:  pending  Hx of PE (08/2023) Home Meds: Eliquis  Concern secondary to decreased p.o. intake with episodes of nausea/vomiting.  Per caretakers, patient was also in charge of her own medication, unsure of her compliance. Continue telemetry monitoring Begin anticoagulation with Eliquis  and ASA   Hypertension Home meds: Amlodipine  10 mg daily, Avalide daily, metoprolol  12.5 mg twice daily, torsemide  20 mg daily (unsure of compliance with medications) Stable BP  goal normotensive  Hyperlipidemia Home meds: Pravastatin  40 mg daily, resume when able to take PO Unsure if patient was taking this at home.  LDL 30, goal < 70 Continue statin at discharge  Diabetes type II Controlled Home meds: Metformin  1000 mg twice daily HgbA1c 4.6, goal < 7.0 CBGs SSI Recommend close follow-up with PCP for better DM control  Tobacco Abuse Patient is a current smoker       Ready to quit? Yes  Substance Abuse Patient uses marijuana       Ready to quit? Yes  Dysphagia Patient has post-stroke dysphagia, SLP consulted    Diet   DIET - DYS 1 Fluid consistency: Thin   Advance diet as tolerated   Other Active Problems Dehydration, Recent weight loss, decreased PO intake AKI with electrolyte abnormalities  Hospital day # 1 I have personally obtained history,examined this patient, reviewed notes, independently viewed imaging studies, participated  in medical decision making and plan of care.ROS completed by me personally and pertinent positives fully documented  I have made any additions or clarifications directly to the above note. Agree with note above.  Is on Eliquis  for pulmonary embolism presented with altered mental status which is likely due to sepsis and MRI shows a small incidental right cerebellar infarct.  No need to do prolonged cardiac monitoring for A-fib as patient already has an indication for long-term anticoagulation.  Discussed with patient's granddaughter at the bedside.  Discussed with Dr.Ghimire.   I personally spent a total of 50 minutes in the care of the patient today including getting/reviewing separately obtained history, performing a medically appropriate exam/evaluation, counseling and educating, placing orders, referring and communicating with other health care professionals, documenting clinical information in the EHR, independently interpreting results, and coordinating care.        Eather Popp, MD Medical Director [facility]  Stroke Center Pager: [phone] 01/31/2024 3:36 PM Patient   To contact Stroke Continuity provider, please refer to [email or web address]. After hours, contact General Neurology

## 2024-01-31 NOTE — Progress Notes (Signed)
 OT Cancellation Note  Patient Details Name: Angie Carr MRN: [id number] DOB: [birth year]   Cancelled Treatment:    Reason Eval/Treat Not Completed: Pain limiting ability to participate;Other (comment) (Per RN, pt currently not appropriate to participate in OT eval due to pain level. Also, per RN, plan for GOC meeting later today. OT to reattempt to see pt at a later time as appropriate/available.)  [name], OTR/L, MA Acute Rehab [phone]   [name] Horns 01/31/2024, 10:41 AM

## 2024-01-31 NOTE — Progress Notes (Signed)
 " Daily Progress Note   Date: 01/31/2024   Patient Name: Angie Carr  DOB: 1955/02/21  MRN: [id number]  Age / Sex: 69 y.o., female  Attending Physician: Raenelle Coria, MD Primary Care Physician: Benjamine Aland, MD Admit Date: 01/29/2024 Length of Stay: 1 day  Reason for Follow-up: Establishing goals of care  Past Medical History:  Diagnosis Date   Anemia    Anxiety    Asthma    pt denies   Diabetes mellitus without complication (HCC)    DJD (degenerative joint disease)    lower back   GERD (gastroesophageal reflux disease)    Headache    miagraines, none in years   Hypertension    LBP (low back pain)    Morbid obesity (HCC)    Neuromuscular disorder (HCC)    feet tingle and her balance is sometimes a problem   Primary osteoarthritis of both knees    Smoker     Assessment & Plan:   HPI/Patient Profile:   69 y.o. female  with past medical history of HTN, T2DM, PE on Eliquis , CHF admitted on 01/29/2024 with sepsis, electrolyte derangement, and acute metabolic encephalopathy. Per critical care consult note on 01/30/2024 by Cogar NP, patient presented with AMS and lethargy after 4 days of poor PO intake. 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 CT A/P on 01/27/2024 demonstrated severe hepatic steatosis and stable 2.5 cm ovoid lesion in the paraspinal lesion along the right lateral aspect of T9 vertebral body. CT head 01/30/2024 demonstrated chronic atrophic and ischemic changes.    Palliative medicine consulted for goals of care conversation and code status discussion.   SUMMARY OF RECOMMENDATIONS DNR-limited Continue current medical interventions Next of kin decision maker is [name] Hopping (niece) Palliative medicine will continue to follow  Symptom Management:  Per primary team  Code Status: DNR - Limited (DNR/DNI)  Prognosis: Unable to determine  Discharge Planning: To Be Determined  Discussed with: Ghimire MD 01/31/2024  about family's decision to continue current management at this time.   Subjective:   Subjective: Chart Reviewed. Updates received. Patient Assessed. Created space and opportunity for patient  and family to explore thoughts and feelings regarding current medical situation.  Today's Discussion:  Met with the patient and granddaughter at bedside [place]). Had family meeting by phone in conference room with many family members including sister Alston), niece Tobey), grandson Earnest), and granddaughters [place], [place], [place]).   Reviewed with family overall events prior to the patient's admission including patient becoming more lethargic, confused, poor PO intake, and forgetting to take her medications. Family also shares that the patient over the last several months has not been eating well at all, having more nausea and diarrhea. Grandson who lives with her Earnest) shares that about 2 days prior to her admission, patient was already exhibiting signs of difficulty finding words. Family shared concerns for the patient as she has been deteriorating over the last couple of months and acknowledge that she has had multiple admissions in the last couple of months. Family shares that the patient lives with her son Darrel) and grandson Earnest). Family shares that [name] had a stroke and has some cognitive deficits.   Reviewed patient's acute medical concerns including her altered mental status which is multifactorial but most likely due to her poor PO intake, electrolyte derangement, and sepsis. Also reviewed that the patient was found to have a stroke. Family inquired about what effects the stroke could have on the  patient and reviewed with them that although it is still early and that it might not be the reason for the patient's altered mentation, they can possibly expect difficult word finding, dysarthria, and possible tremors. Family inquired about what work up has been completed and shared with them that CT  scan of her head and abdomen and the MRI of her head and vessels. Reviewed that outside of the acute CVA of the right cerebellum, all other findings were stable compared to prior including a stable 2.5 cm ovoid shape in the paraspinal region of T9. Shared with family that the current imaging does not show any source of infection. Family inquired about a PET scan to look for malignancy and shared with them that at this point due to lack of concerning imaging from current CT scan, it would not yet be time to pursue a PET scan and that at this point it may not be useful and could be confounded by the patient's current septic state. Family inquired about blood cultures and shared with them that at this point there has been no growth to date. Shared with them that currently the patient's lactate is heading in the right direction but it is certainly still concerning. Family inquired about the patient's congestive heart failure and ECHO, reviewed with them that most recent EF was 60-65%, but the patient just completed an ECHO this morning and the final results are not yet available. Family also inquired about GI involvement and consult and reviewed that the patient has had prior GI work ups but at this time GI has not been consulted.   Family inquired about the patient's PO intake given the patient's more recent history of nausea, vomiting, and diarrhea. Family inquired if we would consider TPN for the patient, reviewed with them the complications with TPN at this time due the patient's transaminitis and that it would not be the next step for poor PO intake. Reviewed that the next step is as long as the patient's stomach is still working, we would attempt a nasogatric tube to feed before TPN. Reviewed that at this time it would not be time to consider a NG tube to feed the patient, but we will give her some more time and will discuss options regarding feeding when it is appropriate.   Family expressed that for now they  would like to continue current medical management. They are aware that in the coming days more decisions will need to be made regarding PO intake. Family also shared that they think that short term rehab at a facility is most likely appropriate for the patient going forward and shared that the patient had previously refused to go to short term rehab. Reviewed with them that at this point it is too soon to consider that far out.   Reviewed with family that the medical team will continue current management, but also for family to consider scenarios where things do not progress in a positive direction, future conversations over goals of care will be held to consider next steps.   Reviewed code status with family and they are agreeable to DNR-limited. Reviewed with family about health care decision maker at this time and they confirm that niece Tobey Hopping) can be primary point of contact at this time but it is still a family decision at the end of the day and the whole family should partake in goals of care discussion when a decision needs to be made. Reviewed with family that I will still have  to make a call to the patient's living son Darrel) to ask if he will defer medical decision making to [facility] and they were agreeable.   Review of Systems  Unable to perform ROS   Objective:   Primary Diagnoses: Present on Admission:  Sepsis (HCC)  Acute on chronic diastolic (congestive) heart failure (HCC)  Acute kidney injury  Gastroesophageal reflux disease  History of pulmonary embolism  Hypokalemia  Hypomagnesemia  Hypotension  Mixed hyperlipidemia  PAD (peripheral artery disease)  Severe sepsis (HCC)  Uncontrolled type 2 diabetes mellitus with hypoglycemia, without long-term current use of insulin  (HCC)  Acute embolic stroke (HCC)   Vital Signs:  BP 101/66   Pulse 77   Temp (!) 96.9 F (36.1 C) (Axillary)   Resp 10   Ht 5' 4 (1.626 m)   Wt 65 kg   SpO2 99%   BMI 24.60 kg/m   Physical  Exam Constitutional:      Appearance: She is ill-appearing.  HENT:     Head: Normocephalic and atraumatic.     Nose: Nose normal.     Mouth/Throat:     Mouth: Mucous membranes are dry.     Comments: Poor dentition, multiple missing teeth.  Eyes:     Extraocular Movements: Extraocular movements intact.  Cardiovascular:     Rate and Rhythm: Normal rate.  Pulmonary:     Effort: Pulmonary effort is normal.  Skin:    General: Skin is warm and dry.  Neurological:     Mental Status: She is disoriented.    Palliative Assessment/Data: 40%   Existing Vynca/ACP Documentation: None  Thank you for allowing us  to participate in the care of SHATASHA LAMBING PMT will continue to support holistically.  I personally spent a total of 65 minutes in the care of the patient today including preparing to see the patient, getting/reviewing separately obtained history, performing a medically appropriate exam/evaluation, counseling and educating, referring and communicating with other health care professionals, and documenting clinical information in the EHR.  Fairy FORBES Shan DEVONNA  Palliative Medicine Team  Team Phone # [phone] (Nights/Weekends) 01/31/2024 8:41 AM  "

## 2024-01-31 NOTE — Progress Notes (Signed)
 " PROGRESS NOTE    Angie Carr  FMW:[id number] DOB: 1955-02-07 DOA: 01/29/2024 PCP: Benjamine Aland, MD    Brief Narrative:  69 year old with history of pulmonary embolism on Eliquis , type 2 diabetes, hypertension, chronic anemia, chronic diastolic dysfunction, GERD, peripheral artery disease, hyperlipidemia brought from home with about 4 days of lethargic, less responsiveness, minimum interaction and laying in bed all the time.  Also reported she might have fallen from bed once or twice.  In the emergency room blood pressures were stable.  Heart rate was stable.  Patient was on room air.  Normotensive.  Normothermic. Sodium 158, potassium 3.3, serum CO2 was 15, creatinine 1.77.  Mildly elevated transaminases.  Lactic acid was 7.2-6.5-7.4. Patient was resuscitated, blood cultures were drawn.  Patient was started on broad-spectrum antibiotics and IV fluids.  A subsequent MRI showed acute stroke.  Patient was admitted to ICU.  Seen by PCCM and neurology.  Of note, patient was recently admitted to the hospital 1/13-1/17 2026 with leg swelling and shortness of breath.  She was given transfusion.  Esophagogram with severe dysmotility disorder.  Treated with diuretics, blood transfusions, Eliquis  was resumed and was discharged home.  Subjective: Patient seen and examined.  Case discussed with bedside nursing staff.  Patient is alert awake, follows commands but confused.  She complains of pain on the right knee and left knee.  Right knee more than left knee. Remains afebrile.  On room air. Electrolytes are improving.  Lactic acid trending down but still elevated.  Assessment & Plan:   Acute metabolic encephalopathy: This is likely related to severe electrolyte abnormalities, respiratory alkalosis, acute stroke.  Mental status is improving.  Will continue to titrate reversible causes and metabolic conditions.  Mobilize with PT OT.  Lactic acidosis, septic shock present on admission: Lactic acid  elevated as high as 7.4.  Already appropriately improving.  No definite source of infection.  Blood cultures and urine cultures were drawn.  In the meantime, we will continue vancomycin  and Zosyn .  MRSA swab was positive.  Patient complains of painful right knee, will check CT scan without contrast today.  Electrolyte abnormalities, acute kidney injury: Patient on multiple GDMT, torsemide  at home.  Hold all antihypertensives.  Will continue on maintenance fluid today.  Monitor renal functions.  Already trending down. Does have chronic hypokalemia and hypomagnesemia, replaced and adequate today.  Recheck tomorrow morning.  Severe dehydration, hyperchloremic hypernatremia: Patient with severe esophageal dysmotility.  Presented with free water deficits.  Continue dextrose  and half-normal saline infusion.  Recheck levels tomorrow morning.  Encourage oral intake. Patient has severe motility disorder of esophagus.  If patient desires, may benefit with PEG tube placement.  Will monitor over next few days about her oral intake to see if she has adequate oral intake.  Metabolic acidosis with respiratory alkalosis: Primary cause unknown.  Repeat VBG is stable.  Will check salicylate levels.  Continue to monitor lactic acid until normalized.  Metformin  was recently discontinued.  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  Difficult to assess focal deficits. Clinical presentation, confusion CT scan of the head, chronic atrophy and chronic ischemic changes MRI of the brain, acute lacunar infarct on the right side. TSH 6 LDL 30 A1c 4.6 Patient with severe dysphagia and poor oral intake.  Does not suggest to be on any statins.  Patient does not need diabetic treatment.  Metformin  discontinued.  Work with PT OT to find next destination for therapies.  History of  PE: Patient was diagnosed with pulmonary embolism on 08/28/2023.  She has been on Eliquis  since then.   Patient has poor mobility and had significant thrombus load.  Will continue Eliquis  once she is able to take by mouth.  Type 2 diabetes: A1c 4.6.  Does not need any treatment.  Avoid hypoglycemia.  Social: Lives at home with disabled son and grand son , remained on poor condition and poor mobility. May need placement    DVT prophylaxis: heparin  injection 5,000 Units Start: 01/31/24 0600 SCDs Start: 01/30/24 1458 SCDs Start: 01/30/24 0734 Place TED hose Start: 01/30/24 0734   Code Status: DNR/DNI.  Appreciate palliative care involvement. Family Communication: Niece [name] is called and updated  Disposition Plan: Status is: Inpatient Remains inpatient appropriate because: severe electrolyte abnormalities.     Consultants:  Neurology  Critical care  Palliative care   Procedures:  None   Antimicrobials:  Vancomycin  and Zosyn  01/30/24----       Objective: Vitals:   01/31/24 1007 01/31/24 1100 01/31/24 1200 01/31/24 1215  BP:  92/71 94/82 101/62  Pulse: 81 84 78 80  Resp: 12 13 14 13   Temp:    (!) 97.3 F (36.3 C)  TempSrc:    Axillary  SpO2: 97% 100% 93% 100%  Weight:      Height:        Intake/Output Summary (Last 24 hours) at 01/31/2024 1344 Last data filed at 01/31/2024 0800 Gross per 24 hour  Intake 4148.85 ml  Output 235 ml  Net 3913.85 ml   Filed Weights   01/30/24 0337 01/31/24 0425  Weight: 64.8 kg 65 kg    Examination:  General exam: Appears sick, frail and sick looking . On room air  Respiratory system: Clear to auscultation. Respiratory effort normal. On room air  Cardiovascular system: S1 & S2 heard, RRR. No JVD, murmurs, rubs, gallops or clicks. Gastrointestinal system: Abdomen is nondistended, soft and nontender. Normal bowel sounds heard. Central nervous system: Alert and awake . Oriented to herself.  Patient is not oriented to place and time.  Not oriented to situation.   She moves both upper extremities with some difficulties.  She has pain  on her right knee.  It is swollen but no edema or erythema. Patient is hesitant to touch to both knees, hesitant to move both legs.    Data Reviewed: I have personally reviewed following labs and imaging studies  CBC: Recent Labs  Lab 01/30/24 0010 01/30/24 1030 01/30/24 1440 01/30/24 1701 01/31/24 0410  WBC 8.6  --  10.6* 12.3* 10.9*  NEUTROABS 7.0  --  9.1*  --   --   HGB 12.5 8.5* 9.2* 9.8* 9.1*  HCT 37.0 25.0* 27.2* 29.0* 27.0*  MCV 99.2  --  99.6 99.7 99.3  PLT 121*  --  127* 129* 130*   Basic Metabolic Panel: Recent Labs  Lab 01/30/24 0010 01/30/24 0735 01/30/24 1030 01/30/24 1439 01/30/24 1701 01/31/24 0410  NA 158* 158* 153* 154*  --  152*  K 3.2* 3.3* 2.8* 3.2*  --  3.7  CL 116* 114*  --  116*  --  117*  CO2 17* 15*  --  13*  --  20*  GLUCOSE 87 85  --  102*  --  149*  BUN 40* 40*  --  38*  --  38*  CREATININE 1.80* 1.77*  --  1.58* 1.49* 1.49*  CALCIUM  7.7* 7.6*  --  6.5*  --  6.5*  MG  --  1.4*  --   --   --  1.7  PHOS  --  3.9  --   --   --  2.2*   GFR: Estimated Creatinine Clearance: 30.8 mL/min (A) (by C-G formula based on SCr of 1.49 mg/dL (H)). Liver Function Tests: Recent Labs  Lab 01/30/24 0010 01/30/24 0735 01/30/24 1439 01/31/24 0410  AST 292* 298* 212* 118*  ALT 286* 290* 216* 181*  ALKPHOS 214* 126 86 86  BILITOT 1.5* 1.5* 1.1 0.8  PROT 6.7 6.7 4.6* 4.5*  ALBUMIN 3.5 3.5 2.4* 2.3*   No results for input(s): LIPASE, AMYLASE in the last 168 hours. No results for input(s): AMMONIA in the last 168 hours. Coagulation Profile: Recent Labs  Lab 01/30/24 0010 01/30/24 1701  INR 1.1 1.3*   Cardiac Enzymes: Recent Labs  Lab 01/30/24 0110  CKTOTAL 48   BNP (last 3 results) Recent Labs    01/14/24 1327 01/30/24 0010  PROBNP 1,157.0* 4,016.0*   HbA1C: Recent Labs    01/30/24 1439  HGBA1C 4.6*   CBG: Recent Labs  Lab 01/30/24 1402 01/30/24 1606 01/30/24 1951 01/31/24 0840 01/31/24 1213  GLUCAP 106* 81 111*  150* 166*   Lipid Profile: Recent Labs    01/30/24 1701  CHOL 79  HDL 23*  LDLCALC 30  TRIG 869  CHOLHDL 3.5   Thyroid  Function Tests: Recent Labs    01/30/24 1701  TSH 6.020*   Anemia Panel: No results for input(s): VITAMINB12, FOLATE, FERRITIN, TIBC, IRON, RETICCTPCT in the last 72 hours. Sepsis Labs: Recent Labs  Lab 01/30/24 0551 01/30/24 0842 01/30/24 1407 01/30/24 2059  LATICACIDVEN 6.5* 7.4* 8.1* 3.9*    Recent Results (from the past 240 hours)  Resp panel by RT-PCR (RSV, Flu A&B, Covid) Anterior Nasal Swab     Status: None   Collection Time: 01/29/24 10:55 PM   Specimen: Anterior Nasal Swab  Result Value Ref Range Status   SARS Coronavirus 2 by RT PCR NEGATIVE NEGATIVE Final   Influenza A by PCR NEGATIVE NEGATIVE Final   Influenza B by PCR NEGATIVE NEGATIVE Final    Comment: (NOTE) The Xpert Xpress SARS-CoV-2/FLU/RSV plus assay is intended as an aid in the diagnosis of influenza from Nasopharyngeal swab specimens and should not be used as a sole basis for treatment. Nasal washings and aspirates are unacceptable for Xpert Xpress SARS-CoV-2/FLU/RSV testing.  Fact Sheet for Patients: bloggercourse.com  Fact Sheet for Healthcare Providers: seriousbroker.it  This test is not yet approved or cleared by the [place]  FDA and has been authorized for detection and/or diagnosis of SARS-CoV-2 by FDA under an Emergency Use Authorization (EUA). This EUA will remain in effect (meaning this test can be used) for the duration of the COVID-19 declaration under Section 564(b)(1) of the Act, 21 U.S.C. section 360bbb-3(b)(1), unless the authorization is terminated or revoked.     Resp Syncytial Virus by PCR NEGATIVE NEGATIVE Final    Comment: (NOTE) Fact Sheet for Patients: bloggercourse.com  Fact Sheet for Healthcare  Providers: seriousbroker.it  This test is not yet approved or cleared by the [place]  FDA and has been authorized for detection and/or diagnosis of SARS-CoV-2 by FDA under an Emergency Use Authorization (EUA). This EUA will remain in effect (meaning this test can be used) for the duration of the COVID-19 declaration under Section 564(b)(1) of the Act, 21 U.S.C. section 360bbb-3(b)(1), unless the authorization is terminated or revoked.  Performed at [facility], [address], [place], [place] [address]  Blood Culture (routine x 2)     Status: None (Preliminary result)   Collection Time: 01/30/24 12:09 AM   Specimen: BLOOD RIGHT ARM  Result Value Ref Range Status   Specimen Description BLOOD RIGHT ARM  Final   Special Requests   Final    BOTTLES DRAWN AEROBIC AND ANAEROBIC Blood Culture results may not be optimal due to an inadequate volume of blood received in culture bottles   Culture   Final    NO GROWTH 1 DAY Performed at [facility], [address], [place], [place] [address]    Report Status PENDING  Incomplete  Blood Culture (routine x 2)     Status: None (Preliminary result)   Collection Time: 01/30/24 12:12 AM   Specimen: BLOOD RIGHT ARM  Result Value Ref Range Status   Specimen Description BLOOD RIGHT ARM  Final   Special Requests   Final    NONE Performed at [facility], [address] [place], [place] [address]    Culture PENDING  Incomplete   Report Status PENDING  Incomplete  MRSA Next Gen by PCR, Nasal     Status: Abnormal   Collection Time: 01/30/24  3:00 PM   Specimen: Nasal Mucosa; Nasal Swab  Result Value Ref Range Status   MRSA by PCR Next Gen DETECTED (A) NOT DETECTED Final    Comment: RESULT CALLED TO, READ BACK BY AND VERIFIED WITH: RN Harlene BIRCH on [phone] @[phone]  by SM (NOTE) The GeneXpert MRSA Assay (FDA approved for NASAL specimens only), is one component of a comprehensive MRSA colonization  surveillance program. It is not intended to diagnose MRSA infection nor to guide or monitor treatment for MRSA infections. Test performance is not FDA approved in patients less than 12 years old. Performed at [facility], [address], [place], [place] [address]          Radiology Studies: ECHOCARDIOGRAM COMPLETE Result Date: 01/31/2024    ECHOCARDIOGRAM REPORT   Patient Name:   Angie Carr Date of Exam: 01/31/2024 Medical Rec #:  [id number]          Height:       64.0 in Accession #:    [id number]         Weight:       143.3 lb Date of Birth:  Feb 27, 1955          BSA:          1.698 m Patient Age:    68 years           BP:           98/58 mmHg Patient Gender: F                  HR:           82 bpm. Exam Location:  Inpatient Procedure: 2D Echo, Cardiac Doppler and Color Doppler (Both Spectral and Color            Flow Doppler were utilized during procedure). Indications:    CHF  History:        Patient has prior history of Echocardiogram examinations, most                 recent 01/15/2024. CHF, PAD, Signs/Symptoms:Hypotension; Risk                 Factors:Diabetes and Dyslipidemia.  Sonographer:    Philomena Daring Referring Phys: [id number] SEYED A SHAHMEHDI IMPRESSIONS  1. Left ventricular ejection fraction, by estimation, is 60 to 65%. The left ventricle has normal function. The left ventricle has no regional wall motion abnormalities. There is moderate concentric left ventricular hypertrophy. Left ventricular diastolic parameters are consistent with Grade I diastolic dysfunction (impaired relaxation).  2. Right ventricular systolic function is normal. The right ventricular size is normal. There is normal pulmonary artery systolic pressure.  3. A small pericardial effusion is present. There is no evidence of cardiac tamponade.  4. The mitral valve is normal in structure. Trivial mitral valve regurgitation. No evidence of mitral stenosis.  5. The aortic valve is tricuspid. Aortic valve  regurgitation is mild. Aortic valve sclerosis/calcification is present, without any evidence of aortic stenosis.  6. There is mild dilatation of the ascending aorta, measuring 41 mm.  7. The inferior vena cava is normal in size with <50% respiratory variability, suggesting right atrial pressure of 8 mmHg. Comparison(s): No significant change from prior study. Conclusion(s)/Recommendation(s): Recommend annual imaging of the aorta via echo vs CT vs MRA for surveillance of aortic dilation. FINDINGS  Left Ventricle: Left ventricular ejection fraction, by estimation, is 60 to 65%. The left ventricle has normal function. The left ventricle has no regional wall motion abnormalities. The left ventricular internal cavity size was normal in size. There is  moderate concentric left ventricular hypertrophy. Left ventricular diastolic parameters are consistent with Grade I diastolic dysfunction (impaired relaxation). Right Ventricle: The right ventricular size is normal. No increase in right ventricular wall thickness. Right ventricular systolic function is normal. There is normal pulmonary artery systolic pressure. The tricuspid regurgitant velocity is 2.24 m/s, and  with an assumed right atrial pressure of 8 mmHg, the estimated right ventricular systolic pressure is 28.1 mmHg. Left Atrium: Left atrial size was normal in size. Right Atrium: Right atrial size was normal in size. Pericardium: A small pericardial effusion is present. There is no evidence of cardiac tamponade. Mitral Valve: The mitral valve is normal in structure. Mild to moderate mitral annular calcification. Trivial mitral valve regurgitation. No evidence of mitral valve stenosis. Tricuspid Valve: The tricuspid valve is normal in structure. Tricuspid valve regurgitation is mild . No evidence of tricuspid stenosis. Aortic Valve: The aortic valve is tricuspid. There is mild aortic valve annular calcification. Aortic valve regurgitation is mild. Aortic regurgitation  PHT measures 106 msec. Aortic valve sclerosis/calcification is present, without any evidence of aortic  stenosis. Pulmonic Valve: The pulmonic valve was normal in structure. Pulmonic valve regurgitation is mild. No evidence of pulmonic stenosis. Aorta: The aortic root is normal in size and structure. There is mild dilatation of the ascending aorta, measuring 41 mm. Venous: The inferior vena cava is normal in size with less than 50% respiratory variability, suggesting right atrial pressure of 8 mmHg. IAS/Shunts: No atrial level shunt detected by color flow Doppler.  LEFT VENTRICLE PLAX 2D LVIDd:         3.50 cm   Diastology LVIDs:         2.30 cm   LV e' medial:    5.55 cm/s LV PW:         1.20 cm   LV E/e' medial:  17.7 LV IVS:        1.40 cm   LV e' lateral:   10.10 cm/s LVOT diam:     1.87 cm   LV E/e' lateral: 9.8 LV SV:         51 LV SV Index:   30 LVOT Area:  2.75 cm  RIGHT VENTRICLE             IVC RV Basal diam:  2.88 cm     IVC diam: 1.49 cm RV Mid diam:    1.94 cm RV S prime:     13.90 cm/s TAPSE (M-mode): 1.8 cm LEFT ATRIUM             Index        RIGHT ATRIUM           Index LA diam:        2.70 cm 1.59 cm/m   RA Area:     10.50 cm LA Vol (A2C):   23.4 ml 13.78 ml/m  RA Volume:   20.80 ml  12.25 ml/m LA Vol (A4C):   21.1 ml 12.43 ml/m LA Biplane Vol: 23.6 ml 13.90 ml/m  AORTIC VALVE LVOT Vmax:   94.90 cm/s LVOT Vmean:  62.900 cm/s LVOT VTI:    0.187 m AI PHT:      106 msec  AORTA Ao Root diam: 3.33 cm Ao Asc diam:  4.10 cm MITRAL VALVE                TRICUSPID VALVE MV Area (PHT): 5.13 cm     TR Peak grad:   20.1 mmHg MV Decel Time: 148 msec     TR Vmax:        224.00 cm/s MV E velocity: 98.50 cm/s MV A velocity: 122.00 cm/s  SHUNTS MV E/A ratio:  0.81         Systemic VTI:  0.19 m                             Systemic Diam: 1.87 cm Georganna Archer Electronically signed by Georganna Archer Signature Date/Time: 01/31/2024/1:11:14 PM    Final    MR ANGIO HEAD WO CONTRAST Result Date:  01/31/2024 CLINICAL DATA:  Follow-up examination for stroke. EXAM: MRA HEAD WITHOUT CONTRAST TECHNIQUE: Angiographic images of the Circle of Willis were acquired using MRA technique without intravenous contrast. COMPARISON:  Comparison made with MRI from earlier the same day. FINDINGS: Anterior circulation: Examination severely degraded by motion artifact, nearly nondiagnostic. Both internal carotid arteries are grossly patent through the siphons to the termini. Evaluation for possible stenosis or other abnormality is severely limited on this exam. Right A1 grossly patent. Left A1 not well seen. Anterior communicating artery complex not adequately assessed on this exam. Partially visualized ACAs are grossly patent distally. M1 segments grossly patent bilaterally. MCA branches not adequately assessed on this exam. Posterior circulation: Left vertebral artery patent to the vertebrobasilar junction. Hypoplastic right vertebral artery not seen, likely occluded given appearance on prior brain MRI. Neither PICA of seen on this exam. Basilar grossly patent without visible stenosis. Superior cerebral arteries grossly patent at their origins. Both PCAs grossly patent as well. Anatomic variants: As above. Other: None. IMPRESSION: 1. Severely motion degraded and nearly nondiagnostic exam. 2. Gross patency of the anterior circulation. 3. Hypoplastic right vertebral artery not seen, likely occluded given appearance on prior brain MRI. Remainder of the posterior circulation is grossly patent. Electronically Signed   By: Morene Hoard M.D.   On: 01/31/2024 02:00   MR BRAIN WO CONTRAST Result Date: 01/30/2024 CLINICAL DATA:  Altered mental status EXAM: MRI HEAD WITHOUT CONTRAST TECHNIQUE: Multiplanar, multiecho pulse sequences of the brain and surrounding structures were obtained without intravenous contrast. COMPARISON:  November 18, 2023 FINDINGS:  MRI brain: There is diffuse cerebral atrophy. There is an acute lacunar  infarct in the right-side of the cerebellum. There is no acute or chronic infarct. The ventricles are normal. No mass lesion. There is absence of the normal flow signal in the right vertebral artery which is unchanged from the previous MRI. The left vertebral artery and basilar artery have normal flow signal as do the carotid arteries No significant bone marrow signal abnormality. No significant abnormality in the paranasal sinuses or soft tissues. IMPRESSION: 1. Diffuse cerebral atrophy 2. Acute lacunar infarct in the right-side of the cerebellum 3. No other significant abnormality Electronically Signed   By: Nancyann Burns M.D.   On: 01/30/2024 11:46   CT CHEST ABDOMEN PELVIS W CONTRAST Result Date: 01/30/2024 EXAM: CT CHEST WITH CONTRAST 01/30/2024 01:42:01 AM TECHNIQUE: CT of the chest was performed with the administration of 75 mL of intravenous iohexol  (OMNIPAQUE ) 350 MG/ML injection. Multiplanar reformatted images are provided for review. Automated exposure control, iterative reconstruction, and/or weight based adjustment of the mA/kV was utilized to reduce the radiation dose to as low as reasonably achievable. COMPARISON: Findings are compared to 11/13/2023. CLINICAL HISTORY: Sepsis, altered mental status, lethargy. FINDINGS: MEDIASTINUM: Heart and pericardium: Extensive multivessel coronary artery calcifications. Trace pericardial effusion. Global cardiac size within normal limits. Central airways: The central airways are clear. Vessels: The central pulmonary arteries are enlarged in keeping with changes of pulmonary arterial hypertension. Dilation of the ascending aorta measuring 4.0 cm in maximal diameter. Descending thoracic aorta is of normal caliber. Moderate atherosclerotic calcification within the thoracic aorta. Recommend annual imaging followup by CTA or MRA. This recommendation follows 2010 ACCF/AHA/AATS/ACR/ASA/SCA/SCAI/SIR/STS/SVM guidelines for the diagnosis and management of patients with  thoracic aortic disease. Circulation. 2010; 121: [id number]. Aortic aneurysm NOS (ICD-10-I71.9). LYMPH NODES: No mediastinal, hilar or axillary lymphadenopathy. LUNGS AND PLEURA: Lungs: Mild emphysema. No focal consolidation or pulmonary edema. Pleura: No pleural effusion or pneumothorax. SOFT TISSUES/BONES: Soft tissues: Ovoid, smooth margined soft tissue lesion within the right paraspinal region measuring 2.5 cm along the right lateral aspect of the T9 vertebral body (43/3) representing a small neurogenic tumor such as a schwannoma or ganglioneuroma. This is unchanged from prior examination. Moderate fat-containing supraumbilical ventral hernia. Bones: Left total hip arthroplasty has been performed. Severe degenerative changes are seen within the right hip. Osseous structures are diffusely osteopenic. Osseous structures are otherwise age appropriate. No acute bone abnormality. UPPER ABDOMEN: Liver: Severe hepatic steatosis. Gallbladder: Status post cholecystectomy. Bowel: Moderate sigmoid diverticulosis without superimposed acute inflammatory change. Appendix normal. The stomach, small bowel, and large bowel are otherwise unremarkable. Pelvis: Uterus absent. No adnexal masses. Vessels: Extensive aortoiliac atherosclerotic disease. RAF score: Aortic atherosclerosis (ICD-10-I70.0), emphysema (ICD-10-J43.9), aortic aneurysm (ICD-10-I71.9). IMPRESSION: 1. Mild emphysema. 2. Enlarged central pulmonary arteries, consistent with pulmonary arterial hypertension. 3. Ascending aortic dilation measuring 4.0 cm in maximal diameter. Recommend annual imaging follow-up by CTA or MRA. 4. Moderate atherosclerotic calcification within the thoracic aorta. 5. Severe hepatic steatosis. 6. Moderate sigmoid diverticulosis without superimposed acute inflammatory change. 7. Ovoid, smooth margined soft tissue lesion within the right paraspinal region measuring 2.5 cm along the right lateral aspect of the T9 vertebral body, unchanged from  prior examination, likely representing a small neurogenic tumor such as a schwannoma or ganglioglioma. 8. Moderate fat-containing supraumbilical ventral hernia. Electronically signed by: Dorethia Molt MD 01/30/2024 02:04 AM EST RP Workstation: [id number]   CT Head Wo Contrast Result Date: 01/30/2024 EXAM: CT HEAD WITHOUT CONTRAST 01/30/2024 01:42:01 AM TECHNIQUE: CT of the  head was performed without the administration of intravenous contrast. Automated exposure control, iterative reconstruction, and/or weight based adjustment of the mA/kV was utilized to reduce the radiation dose to as low as reasonably achievable. COMPARISON: MRI dated 11/18/2023. CLINICAL HISTORY: Mental status change, unknown cause. FINDINGS: BRAIN AND VENTRICLES: No acute hemorrhage. No evidence of acute infarct. Patchy and confluent decreased attenuation throughout deep and periventricular white matter of cerebral hemispheres bilaterally, compatible with chronic microvascular ischemic disease. Chronic basal ganglia lacunar infarcts are again seen and stable. Cerebral ventricle sizes concordant with degree of cerebral volume loss. No extra-axial collection. No mass effect or midline shift. Atherosclerotic calcifications within cavernous internal carotid arteries. ORBITS: No acute abnormality. SINUSES: No acute abnormality. SOFT TISSUES AND SKULL: No acute soft tissue abnormality. No skull fracture. IMPRESSION: 1. Chronic atrophic and ischemic changes. No acute abnormality noted. Electronically signed by: Oneil Devonshire MD 01/30/2024 01:47 AM EST RP Workstation: [id number]   DG Chest Port 1 View Result Date: 01/29/2024 EXAM: 1 VIEW(S) XRAY OF THE CHEST 01/29/2024 11:42:25 PM COMPARISON: 01/14/2024 CLINICAL HISTORY: Possible sepsis. FINDINGS: LUNGS AND PLEURA: No focal pulmonary opacity. No pleural effusion. No pneumothorax. HEART AND MEDIASTINUM: Tortuous thoracic aorta with aortic atherosclerosis. BONES AND SOFT TISSUES: Multilevel  degenerative changes of spine. IMPRESSION: 1. No acute cardiopulmonary abnormality. Electronically signed by: Oneil Devonshire MD 01/29/2024 11:46 PM EST RP Workstation: [id number]        Scheduled Meds:  Chlorhexidine  Gluconate Cloth  6 each Topical Daily   heparin   5,000 Units Subcutaneous Q8H   insulin  aspart  0-6 Units Subcutaneous Q4H   sodium chloride  flush  3 mL Intravenous Q12H   sodium chloride  flush  3 mL Intravenous Q12H   vancomycin  variable dose per unstable renal function (pharmacist dosing)   Does not apply See admin instructions   Continuous Infusions:  dextrose  5 % and 0.45 % NaCl 100 mL/hr at 01/31/24 1232   piperacillin -tazobactam (ZOSYN )  IV 3.375 g (01/31/24 0503)   thiamine  (VITAMIN B1) injection 500 mg (01/31/24 1231)     LOS: 1 day    Time spent: 65 minutes    Renato Applebaum, MD [facility]   "

## 2024-02-01 DIAGNOSIS — Z515 Encounter for palliative care: Secondary | ICD-10-CM | POA: Diagnosis not present

## 2024-02-01 DIAGNOSIS — E872 Acidosis, unspecified: Secondary | ICD-10-CM | POA: Diagnosis not present

## 2024-02-01 DIAGNOSIS — Z66 Do not resuscitate: Secondary | ICD-10-CM

## 2024-02-01 DIAGNOSIS — A419 Sepsis, unspecified organism: Secondary | ICD-10-CM | POA: Diagnosis not present

## 2024-02-01 DIAGNOSIS — Z7189 Other specified counseling: Secondary | ICD-10-CM | POA: Diagnosis not present

## 2024-02-01 LAB — BASIC METABOLIC PANEL WITH GFR
Anion gap: 12 (ref 5–15)
BUN: 26 mg/dL — ABNORMAL HIGH (ref 8–23)
CO2: 16 mmol/L — ABNORMAL LOW (ref 22–32)
Calcium: 6.6 mg/dL — ABNORMAL LOW (ref 8.9–10.3)
Chloride: 116 mmol/L — ABNORMAL HIGH (ref 98–111)
Creatinine, Ser: 1.25 mg/dL — ABNORMAL HIGH (ref 0.44–1.00)
GFR, Estimated: 46 mL/min — ABNORMAL LOW
Glucose, Bld: 153 mg/dL — ABNORMAL HIGH (ref 70–99)
Potassium: 3 mmol/L — ABNORMAL LOW (ref 3.5–5.1)
Sodium: 144 mmol/L (ref 135–145)

## 2024-02-01 LAB — CBC
HCT: 29.1 % — ABNORMAL LOW (ref 36.0–46.0)
Hemoglobin: 9.5 g/dL — ABNORMAL LOW (ref 12.0–15.0)
MCH: 33.5 pg (ref 26.0–34.0)
MCHC: 32.6 g/dL (ref 30.0–36.0)
MCV: 102.5 fL — ABNORMAL HIGH (ref 80.0–100.0)
Platelets: 101 10*3/uL — ABNORMAL LOW (ref 150–400)
RBC: 2.84 MIL/uL — ABNORMAL LOW (ref 3.87–5.11)
RDW: 22 % — ABNORMAL HIGH (ref 11.5–15.5)
WBC: 6.3 10*3/uL (ref 4.0–10.5)
nRBC: 0.8 % — ABNORMAL HIGH (ref 0.0–0.2)

## 2024-02-01 LAB — GLUCOSE, CAPILLARY
Glucose-Capillary: 131 mg/dL — ABNORMAL HIGH (ref 70–99)
Glucose-Capillary: 117 mg/dL — ABNORMAL HIGH (ref 70–99)
Glucose-Capillary: 158 mg/dL — ABNORMAL HIGH (ref 70–99)
Glucose-Capillary: 165 mg/dL — ABNORMAL HIGH (ref 70–99)
Glucose-Capillary: 133 mg/dL — ABNORMAL HIGH (ref 70–99)

## 2024-02-01 LAB — LACTIC ACID, PLASMA: Lactic Acid, Venous: 3.8 mmol/L (ref 0.5–1.9)

## 2024-02-01 LAB — MAGNESIUM: Magnesium: 1.8 mg/dL (ref 1.7–2.4)

## 2024-02-01 LAB — VANCOMYCIN, RANDOM: Vancomycin Rm: <4 ug/mL

## 2024-02-01 LAB — PHOSPHORUS: Phosphorus: 2.2 mg/dL — ABNORMAL LOW (ref 2.5–4.6)

## 2024-02-01 MED ORDER — VANCOMYCIN HCL IN DEXTROSE 1-5 GM/200ML-% IV SOLN
1000.0000 mg | Freq: Once | INTRAVENOUS | Status: AC
  Administered 2024-02-01: 1000 mg via INTRAVENOUS
  Filled 2024-02-01: qty 200

## 2024-02-01 MED ORDER — MUPIROCIN 2 % EX OINT
TOPICAL_OINTMENT | Freq: Two times a day (BID) | CUTANEOUS | Status: AC
  Administered 2024-02-02 (×2): 1 via NASAL
  Filled 2024-02-01 (×3): qty 22

## 2024-02-01 MED ORDER — METOPROLOL TARTRATE 5 MG/5ML IV SOLN: 5.0000 mg | Freq: Four times a day (QID) | INTRAVENOUS | Status: DC | PRN

## 2024-02-01 MED ORDER — AMIODARONE LOAD VIA INFUSION
150.0000 mg | Freq: Once | INTRAVENOUS | Status: AC
  Administered 2024-02-01: 150 mg via INTRAVENOUS
  Filled 2024-02-01: qty 83.34

## 2024-02-01 MED ORDER — AMIODARONE HCL IN DEXTROSE 360-4.14 MG/200ML-% IV SOLN
60.0000 mg/h | INTRAVENOUS | Status: AC
  Administered 2024-02-01 (×2): 60 mg/h via INTRAVENOUS
  Filled 2024-02-01: qty 200

## 2024-02-01 MED ORDER — POTASSIUM PHOSPHATES 15 MMOLE/5ML IV SOLN
15.0000 mmol | Freq: Once | INTRAVENOUS | Status: AC
  Administered 2024-02-01: 15 mmol via INTRAVENOUS
  Filled 2024-02-01: qty 5

## 2024-02-01 MED ORDER — AMIODARONE HCL IN DEXTROSE 360-4.14 MG/200ML-% IV SOLN
30.0000 mg/h | INTRAVENOUS | Status: DC
  Administered 2024-02-01: 30 mg/h via INTRAVENOUS
  Filled 2024-02-01 (×2): qty 200

## 2024-02-01 MED ORDER — DEXTROSE-SODIUM CHLORIDE 5-0.45 % IV SOLN: INTRAVENOUS | Status: AC

## 2024-02-01 NOTE — Progress Notes (Signed)
 Pt foley urine output has been 35 cc since 0700 this morning. Bladder scanned showed 0cc. Looks like pt has voided around and onto bed pads. Flushed foley but there was some resistance. Notified MD and was told to remove and keep monitoring. Placed purewick  Also, pt HR in the 140s. EKG obtained. Demonstrated a-fib. MD made aware w/no further instructions given. Pt denies CP, SOB  Will continue to monitor pt.

## 2024-02-01 NOTE — Evaluation (Signed)
 Physical Therapy Evaluation Patient Details Name: Angie Carr MRN: [id number] DOB: 1955-10-10 Today's Date: 02/01/2024  History of Present Illness  Pt is 69 year old presented to [facility] on  01/29/24 for AMS/lethargy. 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 Pt developed afib with rvr on 02/01/24. PMH - CHF, DM2 , HTN, PVD, PE , anxiety, asthma,  HTN, neuromuscular disorder, COVID, DJD, esophageal stenosis  Clinical Impression  Limited PT eval due to pain in bilateral lower extremities and generalized pain and high heart rate from onset of afib. Pt unable to provide history but according to last PT notes when she was here 2 weeks ago she was already having difficulty mobilizing due to painful LE's. Prior to that admission she had been ambulating independently with rollator/cane. Currently the little mobilization she tolerated required total assist. Patient will benefit from continued inpatient follow up therapy, <3 hours/day.        If plan is discharge home, recommend the following: Two people to help with walking and/or transfers;Two people to help with bathing/dressing/bathroom;Assist for transportation;Assistance with cooking/housework   Can travel by private vehicle   No    Equipment Recommendations Wheelchair (measurements PT);Wheelchair cushion (measurements PT);Hoyer lift  Recommendations for [employer]       Functional Status Assessment Patient has had a recent decline in their functional status and demonstrates the ability to make significant improvements in function in a reasonable and predictable amount of time.     Precautions / Restrictions Precautions Precautions: Fall;Other (comment) Recall of Precautions/Restrictions: Impaired Precaution/Restrictions Comments: Watch HR Restrictions Weight Bearing Restrictions Per Provider Order: No      Mobility  Bed  Mobility Overal bed mobility: Needs Assistance Bed Mobility: Rolling, Supine to Sit, Sit to Supine Rolling: Total assist   Supine to sit: Total assist, HOB elevated Sit to supine: Total assist, HOB elevated   General bed mobility comments: Assist for all aspect    Transfers                   General transfer comment: Did not attempt due to pain and high HR    Ambulation/Gait                  Stairs            Wheelchair Mobility     Tilt Bed    Modified Rankin (Stroke Patients Only)       Balance Overall balance assessment: Needs assistance Sitting-balance support: Bilateral upper extremity supported, Feet unsupported Sitting balance-Leahy Scale: Poor                                       Pertinent Vitals/Pain Pain Assessment Pain Assessment: Faces Faces Pain Scale: Hurts whole lot Pain Location: BLE, generalized Pain Descriptors / Indicators: Grimacing, Guarding, Moaning Pain Intervention(s): Limited activity within patient's tolerance, Monitored during session, Repositioned    Home Living Family/patient expects to be discharged to:: Private residence Living Arrangements: Children;Other relatives (son and grandson) Available Help at Discharge: Family;Available 24 hours/day Type of Home: House Home Access: Ramped entrance       Home Layout: One level Home Equipment: Cane - single point;Rollator (4 wheels);Toilet riser;Shower seat;BSC/3in1 Additional Comments: Son is in a w/c and independent at w/c level. He cannot physically assist pt and he has cognitive deficits.  Prior Function Prior Level of Function : Patient poor historian/Family not available             Mobility Comments: Prior to hospitalization 2 weeks ago she was amb modified independent with rollator. When dc'd home 2 weeks ago was requiring min to mod assist for mobility.       Extremity/Trunk Assessment   Upper Extremity Assessment Upper  Extremity Assessment: Defer to OT evaluation    Lower Extremity Assessment Lower Extremity Assessment: RLE deficits/detail;LLE deficits/detail;Difficult to assess due to impaired cognition RLE Deficits / Details: Pt only able to tolerate limited movement due to pain LLE Deficits / Details: Pt only able to tolerate limited movement due to pain    Cervical / Trunk Assessment Cervical / Trunk Assessment: Kyphotic  Communication   Communication Communication: Impaired Factors Affecting Communication: Reduced clarity of speech    Cognition Arousal: Alert Behavior During Therapy: Flat affect   PT - Cognitive impairments: Awareness, Orientation, Problem solving, Safety/Judgement, Sequencing, Initiation, Memory   Orientation impairments: Time, Situation                     Following commands: Impaired Following commands impaired: Follows one step commands inconsistently, Follows one step commands with increased time     Cueing Cueing Techniques: Verbal cues, Tactile cues     General Comments General comments (skin integrity, edema, etc.): HR to 140's    Exercises     Assessment/Plan    PT Assessment Patient needs continued PT services  PT Problem List Decreased strength;Decreased range of motion;Decreased activity tolerance;Decreased balance;Decreased mobility;Decreased cognition;Decreased knowledge of use of DME;Pain;Cardiopulmonary status limiting activity       PT Treatment Interventions DME instruction;Gait training;Functional mobility training;Therapeutic activities;Therapeutic exercise;Balance training;Patient/family education;Cognitive remediation    PT Goals (Current goals can be found in the Care Plan section)  Acute Rehab PT Goals Patient Stated Goal: Pt unable to state PT Goal Formulation: Patient unable to participate in goal setting Time For Goal Achievement: 02/15/24 Potential to Achieve Goals: Fair    Frequency Min 2X/week     Co-evaluation                AM-PAC PT 6 Clicks Mobility  Outcome Measure Help needed turning from your back to your side while in a flat bed without using bedrails?: Total Help needed moving from lying on your back to sitting on the side of a flat bed without using bedrails?: Total Help needed moving to and from a bed to a chair (including a wheelchair)?: Total Help needed standing up from a chair using your arms (e.g., wheelchair or bedside chair)?: Total Help needed to walk in hospital room?: Total Help needed climbing 3-5 steps with a railing? : Total 6 Click Score: 6    End of Session   Activity Tolerance: Patient limited by pain;Treatment limited secondary to medical complications (Comment) (high HR) Patient left: in bed;with call bell/phone within reach;with bed alarm set Nurse Communication: Mobility status PT Visit Diagnosis: Other abnormalities of gait and mobility (R26.89);Muscle weakness (generalized) (M62.81);Pain Pain - Right/Left:  (bilateral) Pain - part of body: Leg;Ankle and joints of foot    Time: 1152-1212 PT Time Calculation (min) (ACUTE ONLY): 20 min   Charges:   PT Evaluation $PT Eval Moderate Complexity: 1 Mod   PT General Charges $$ ACUTE PT VISIT: 1 Visit         [facility] PT Acute Rehabilitation Services Office [phone]   Rodgers ORN [facility] 02/01/2024, 2:15 PM

## 2024-02-01 NOTE — Progress Notes (Signed)
 OT Cancellation Note  Patient Details Name: Angie Carr MRN: [id number] DOB: 01-17-1955   Cancelled Treatment:    Reason Eval/Treat Not Completed: Medical issues which prohibited therapy (Pt with increased HR and pain limited ability to participate in OT eval at this time. OT to reattempt to see pt at a later time as appropriate/available.)  [name], OTR/L, MA Acute Rehab [phone]   [name] Horns 02/01/2024, 12:38 PM

## 2024-02-01 NOTE — Significant Event (Signed)
 Patient has developed A-fib with RVR, no previously documented history of A-fib.  EKG confirms it.  Heart rate remains more than 140.  Patient herself denies any chest pain or shortness of breath or palpitations.  Blood pressures remain 107/69.  95-107 systolic.  Patient did come with low blood pressures was difficult to maintain her blood pressure.  She also has diastolic dysfunction and avoiding her large amount of IV fluids.  Given low-normal blood pressures, will start patient on amiodarone  loading dose and maintenance.  She is already therapeutic on Lovenox  for anticoagulation. Echocardiogram with normal EF, grade 1 diastolic dysfunction. TSH normal.  6.02. Replacing potassium, phosphorus. Patient has to be on amiodarone  drip, will stay in the ICU.

## 2024-02-02 DIAGNOSIS — A419 Sepsis, unspecified organism: Secondary | ICD-10-CM | POA: Diagnosis not present

## 2024-02-02 DIAGNOSIS — E872 Acidosis, unspecified: Secondary | ICD-10-CM

## 2024-02-02 DIAGNOSIS — I4891 Unspecified atrial fibrillation: Secondary | ICD-10-CM

## 2024-02-02 LAB — CBC
HCT: 31.9 % — ABNORMAL LOW (ref 36.0–46.0)
Hemoglobin: 10.3 g/dL — ABNORMAL LOW (ref 12.0–15.0)
MCH: 33.9 pg (ref 26.0–34.0)
MCHC: 32.3 g/dL (ref 30.0–36.0)
MCV: 104.9 fL — ABNORMAL HIGH (ref 80.0–100.0)
Platelets: 118 10*3/uL — ABNORMAL LOW (ref 150–400)
RBC: 3.04 MIL/uL — ABNORMAL LOW (ref 3.87–5.11)
RDW: 21.9 % — ABNORMAL HIGH (ref 11.5–15.5)
WBC: 5.9 10*3/uL (ref 4.0–10.5)
nRBC: 1.7 % — ABNORMAL HIGH (ref 0.0–0.2)

## 2024-02-02 LAB — GLUCOSE, CAPILLARY
Glucose-Capillary: 101 mg/dL — ABNORMAL HIGH (ref 70–99)
Glucose-Capillary: 120 mg/dL — ABNORMAL HIGH (ref 70–99)
Glucose-Capillary: 66 mg/dL — ABNORMAL LOW (ref 70–99)
Glucose-Capillary: 77 mg/dL (ref 70–99)
Glucose-Capillary: 79 mg/dL (ref 70–99)
Glucose-Capillary: 87 mg/dL (ref 70–99)
Glucose-Capillary: 89 mg/dL (ref 70–99)
Glucose-Capillary: 98 mg/dL (ref 70–99)

## 2024-02-02 LAB — BASIC METABOLIC PANEL WITH GFR
Anion gap: 16 — ABNORMAL HIGH (ref 5–15)
BUN: 18 mg/dL (ref 8–23)
CO2: 14 mmol/L — ABNORMAL LOW (ref 22–32)
Calcium: 6.5 mg/dL — ABNORMAL LOW (ref 8.9–10.3)
Chloride: 111 mmol/L (ref 98–111)
Creatinine, Ser: 1.11 mg/dL — ABNORMAL HIGH (ref 0.44–1.00)
GFR, Estimated: 54 mL/min — ABNORMAL LOW
Glucose, Bld: 93 mg/dL (ref 70–99)
Potassium: 2.7 mmol/L — CL (ref 3.5–5.1)
Sodium: 142 mmol/L (ref 135–145)

## 2024-02-02 MED ORDER — AMIODARONE HCL 200 MG PO TABS
200.0000 mg | ORAL_TABLET | Freq: Two times a day (BID) | ORAL | Status: AC
  Administered 2024-02-02 – 2024-02-07 (×11): 200 mg via ORAL
  Filled 2024-02-02 (×12): qty 1

## 2024-02-02 MED ORDER — POTASSIUM CHLORIDE CRYS ER 20 MEQ PO TBCR
40.0000 meq | EXTENDED_RELEASE_TABLET | Freq: Two times a day (BID) | ORAL | Status: AC
  Administered 2024-02-02 – 2024-02-03 (×4): 40 meq via ORAL
  Filled 2024-02-02 (×4): qty 2

## 2024-02-02 MED ORDER — POTASSIUM CHLORIDE 10 MEQ/100ML IV SOLN
10.0000 meq | INTRAVENOUS | Status: AC
  Administered 2024-02-02 (×4): 10 meq via INTRAVENOUS
  Filled 2024-02-02 (×4): qty 100

## 2024-02-02 MED ORDER — AMIODARONE HCL 200 MG PO TABS: 200.0000 mg | ORAL_TABLET | Freq: Every day | ORAL | Status: AC

## 2024-02-02 MED ORDER — DEXTROSE 50 % IV SOLN
INTRAVENOUS | Status: AC
  Filled 2024-02-02: qty 50

## 2024-02-02 MED ORDER — DEXTROSE 50 % IV SOLN
12.5000 g | INTRAVENOUS | Status: AC
  Administered 2024-02-02: 12.5 g via INTRAVENOUS

## 2024-02-02 MED ORDER — POTASSIUM CHLORIDE CRYS ER 20 MEQ PO TBCR: 40.0000 meq | EXTENDED_RELEASE_TABLET | ORAL | Status: DC

## 2024-02-02 MED ORDER — ENSURE PLUS HIGH PROTEIN PO LIQD
237.0000 mL | Freq: Two times a day (BID) | ORAL | Status: AC
  Administered 2024-02-03 – 2024-02-05 (×3): 237 mL via ORAL

## 2024-02-02 NOTE — Progress Notes (Addendum)
 PT refused breakfast and now refusing lunch. Whatever y'all brought, y'all can have it. She states she was about to leave to go home and see her great grand babies. PT still poor oral intake by choice.

## 2024-02-02 NOTE — Progress Notes (Signed)
 Hypoglycemic Event  CBG: 66  Treatment: D50 25 mL (12.5 gm)  Symptoms: None  Follow-up CBG: Time:0357 CBG Result:120  Possible Reasons for Event: Inadequate meal intake  Comments/MD notified: Protocol implemented     Angie Carr M Gurkirat Basher

## 2024-02-02 NOTE — Plan of Care (Signed)
" °  Problem: Fluid Volume: Goal: Hemodynamic stability will improve Outcome: Progressing   Problem: Respiratory: Goal: Ability to maintain adequate ventilation will improve Outcome: Progressing   Problem: Skin Integrity: Goal: Risk for impaired skin integrity will decrease Outcome: Progressing   "

## 2024-02-03 ENCOUNTER — Encounter (HOSPITAL_COMMUNITY): Payer: Self-pay | Admitting: Family Medicine

## 2024-02-03 DIAGNOSIS — A419 Sepsis, unspecified organism: Secondary | ICD-10-CM | POA: Diagnosis not present

## 2024-02-03 DIAGNOSIS — E872 Acidosis, unspecified: Secondary | ICD-10-CM | POA: Diagnosis not present

## 2024-02-03 LAB — HEPATIC FUNCTION PANEL
ALT: 103 U/L — ABNORMAL HIGH (ref 0–44)
AST: 36 U/L (ref 15–41)
Albumin: 2.3 g/dL — ABNORMAL LOW (ref 3.5–5.0)
Alkaline Phosphatase: 94 U/L (ref 38–126)
Bilirubin, Direct: 0.3 mg/dL — ABNORMAL HIGH (ref 0.0–0.2)
Indirect Bilirubin: 0.2 mg/dL — ABNORMAL LOW (ref 0.3–0.9)
Total Bilirubin: 0.5 mg/dL (ref 0.0–1.2)
Total Protein: 4.5 g/dL — ABNORMAL LOW (ref 6.5–8.1)

## 2024-02-03 LAB — CBC
HCT: 28.1 % — ABNORMAL LOW (ref 36.0–46.0)
Hemoglobin: 9.7 g/dL — ABNORMAL LOW (ref 12.0–15.0)
MCH: 33.6 pg (ref 26.0–34.0)
MCHC: 34.5 g/dL (ref 30.0–36.0)
MCV: 97.2 fL (ref 80.0–100.0)
Platelets: 128 10*3/uL — ABNORMAL LOW (ref 150–400)
RBC: 2.89 MIL/uL — ABNORMAL LOW (ref 3.87–5.11)
RDW: 21.2 % — ABNORMAL HIGH (ref 11.5–15.5)
WBC: 5.8 10*3/uL (ref 4.0–10.5)
nRBC: 0.9 % — ABNORMAL HIGH (ref 0.0–0.2)

## 2024-02-03 LAB — BASIC METABOLIC PANEL WITH GFR
Anion gap: 14 (ref 5–15)
BUN: 13 mg/dL (ref 8–23)
CO2: 15 mmol/L — ABNORMAL LOW (ref 22–32)
Calcium: 6.6 mg/dL — ABNORMAL LOW (ref 8.9–10.3)
Chloride: 112 mmol/L — ABNORMAL HIGH (ref 98–111)
Creatinine, Ser: 0.92 mg/dL (ref 0.44–1.00)
GFR, Estimated: >60 mL/min
Glucose, Bld: 84 mg/dL (ref 70–99)
Potassium: 3 mmol/L — ABNORMAL LOW (ref 3.5–5.1)
Sodium: 141 mmol/L (ref 135–145)

## 2024-02-03 LAB — GLUCOSE, CAPILLARY
Glucose-Capillary: 81 mg/dL (ref 70–99)
Glucose-Capillary: 118 mg/dL — ABNORMAL HIGH (ref 70–99)
Glucose-Capillary: 116 mg/dL — ABNORMAL HIGH (ref 70–99)
Glucose-Capillary: 120 mg/dL — ABNORMAL HIGH (ref 70–99)
Glucose-Capillary: 93 mg/dL (ref 70–99)
Glucose-Capillary: 120 mg/dL — ABNORMAL HIGH (ref 70–99)

## 2024-02-03 LAB — PHOSPHORUS: Phosphorus: 1.9 mg/dL — ABNORMAL LOW (ref 2.5–4.6)

## 2024-02-03 LAB — MAGNESIUM: Magnesium: 1.4 mg/dL — ABNORMAL LOW (ref 1.7–2.4)

## 2024-02-03 MED ORDER — MAGNESIUM SULFATE 4 GM/100ML IV SOLN
4.0000 g | Freq: Once | INTRAVENOUS | Status: AC
  Administered 2024-02-03: 4 g via INTRAVENOUS
  Filled 2024-02-03: qty 100

## 2024-02-03 MED ORDER — POTASSIUM PHOSPHATES 15 MMOLE/5ML IV SOLN
30.0000 mmol | Freq: Once | INTRAVENOUS | Status: AC
  Administered 2024-02-03: 30 mmol via INTRAVENOUS
  Filled 2024-02-03: qty 10

## 2024-02-03 MED ORDER — ORAL CARE MOUTH RINSE: 15.0000 mL | OROMUCOSAL | Status: AC | PRN

## 2024-02-03 MED ORDER — ORAL CARE MOUTH RINSE
15.0000 mL | OROMUCOSAL | Status: AC
  Administered 2024-02-03 – 2024-02-07 (×11): 15 mL via OROMUCOSAL

## 2024-02-03 NOTE — Progress Notes (Signed)
 "  Rounding Note   Patient Name: Angie Carr Date of Encounter: 02/03/2024  [facility] Cardiologist: None   Subjective  - No acute events overnight. - Patient has no complaints today.  Scheduled Meds:  amiodarone   200 mg Oral BID   Followed by   NOREEN ON 02/16/2024] amiodarone   200 mg Oral Daily   aspirin  EC  81 mg Oral Daily   Chlorhexidine  Gluconate Cloth  6 each Topical Daily   enoxaparin  (LOVENOX ) injection  65 mg Subcutaneous Q24H   feeding supplement  237 mL Oral BID BM   insulin  aspart  0-6 Units Subcutaneous Q4H   mupirocin  ointment   Nasal BID   potassium chloride   40 mEq Oral BID   sodium chloride  flush  3 mL Intravenous Q12H   sodium chloride  flush  3 mL Intravenous Q12H   Continuous Infusions:  thiamine  (VITAMIN B1) injection Stopped (02/02/24 1404)   PRN Meds: acetaminophen  **OR** acetaminophen , HYDROmorphone  (DILAUDID ) injection, ipratropium, ondansetron  **OR** ondansetron  (ZOFRAN ) IV, oxyCODONE , senna-docusate, traZODone    Vital Signs  Vitals:   02/03/24 0400 02/03/24 0500 02/03/24 0600 02/03/24 0623  BP: 103/85 127/75 122/72   Pulse: 86 87 86   Resp: 18 13 14    Temp:      TempSrc:      SpO2: 100% 100% 100%   Weight:    69 kg  Height:        Intake/Output Summary (Last 24 hours) at 02/03/2024 0649 Last data filed at 02/03/2024 0643 Gross per 24 hour  Intake 1533.62 ml  Output 1550 ml  Net -16.38 ml      02/03/2024    6:23 AM 02/02/2024    3:13 AM 02/01/2024    4:14 AM  Last 3 Weights  Weight (lbs) 152 lb 1.9 oz 153 lb 141 lb 8.6 oz  Weight (kg) 69 kg 69.4 kg 64.2 kg      Telemetry NSR- Personally Reviewed  ECG  No new ECG  Physical Exam  GEN: No acute distress.   Neck: No JVD Cardiac: RRR, no murmurs, rubs, or gallops.  Respiratory: Clear to auscultation bilaterally. GI: Soft, nontender, non-distended  MS: No edema; No deformity. Neuro:  A&O Psych: Normal affect   Labs High Sensitivity Troponin:  No results for  input(s): TROPONINIHS in the last 720 hours.  Recent Labs  Lab 01/14/24 1327 01/14/24 1735  TRNPT 33* 28*       Chemistry Recent Labs  Lab 01/30/24 1439 01/30/24 1701 01/31/24 0410 02/01/24 1033 02/02/24 0733 02/03/24 0604  NA 154*  --  152* 144 142 141  K 3.2*  --  3.7 3.0* 2.7* 3.0*  CL 116*  --  117* 116* 111 112*  CO2 13*  --  20* 16* 14* 15*  GLUCOSE 102*  --  149* 153* 93 84  BUN 38*  --  38* 26* 18 13  CREATININE 1.58*   < > 1.49* 1.25* 1.11* 0.92  CALCIUM  6.5*  --  6.5* 6.6* 6.5* 6.6*  MG  --   --  1.7 1.8  --  1.4*  PROT 4.6*  --  4.5*  --   --  4.5*  ALBUMIN 2.4*  --  2.3*  --   --  2.3*  AST 212*  --  118*  --   --  36  ALT 216*  --  181*  --   --  103*  ALKPHOS 86  --  86  --   --  94  BILITOT 1.1  --  0.8  --   --  0.5  GFRNONAA 35*   < > 38* 46* 54* >60  ANIONGAP 26*  --  15 12 16* 14   < > = values in this interval not displayed.    Lipids  Recent Labs  Lab 01/30/24 1701  CHOL 79  TRIG 130  HDL 23*  LDLCALC 30  CHOLHDL 3.5    Hematology Recent Labs  Lab 02/01/24 1033 02/02/24 0733 02/03/24 0604  WBC 6.3 5.9 5.8  RBC 2.84* 3.04* 2.89*  HGB 9.5* 10.3* 9.7*  HCT 29.1* 31.9* 28.1*  MCV 102.5* 104.9* 97.2  MCH 33.5 33.9 33.6  MCHC 32.6 32.3 34.5  RDW 22.0* 21.9* 21.2*  PLT 101* 118* 128*   Thyroid   Recent Labs  Lab 01/30/24 1701  TSH 6.020*    BNP Recent Labs  Lab 01/30/24 0010  PROBNP 4,016.0*    DDimer No results for input(s): DDIMER in the last 168 hours.   Radiology  No results found.  Cardiac Studies  TTE 01/31/24:  IMPRESSIONS     1. Left ventricular ejection fraction, by estimation, is 60 to 65%. The  left ventricle has normal function. The left ventricle has no regional  wall motion abnormalities. There is moderate concentric left ventricular  hypertrophy. Left ventricular  diastolic parameters are consistent with Grade I diastolic dysfunction  (impaired relaxation).   2. Right ventricular systolic  function is normal. The right ventricular  size is normal. There is normal pulmonary artery systolic pressure.   3. A small pericardial effusion is present. There is no evidence of  cardiac tamponade.   4. The mitral valve is normal in structure. Trivial mitral valve  regurgitation. No evidence of mitral stenosis.   5. The aortic valve is tricuspid. Aortic valve regurgitation is mild.  Aortic valve sclerosis/calcification is present, without any evidence of  aortic stenosis.   6. There is mild dilatation of the ascending aorta, measuring 41 mm.   7. The inferior vena cava is normal in size with <50% respiratory  variability, suggesting right atrial pressure of 8 mmHg.   Patient Profile   Angie Carr is a 69 y.o. female with a hx of pulmonary embolism, T2DM, hypertension, PAD, hyperlipidemia who is being seen 02/02/2024 for the evaluation of atrial fibrillation at the request of Dr. Raenelle   Assessment & Plan    #New PAF w/ RVR (resolved) - Patient developed new onset atrial fibrillation with RVR this admission. - TTE showed preserved LVEF. - The patient has been intolerant to beta-blockers and calcium  channel blockers due to hypotension so was started on Amio infusion followed by oral amiodarone  load. - LFTs are improving. - Plan to do a short course of amiodarone  for several weeks and then can reassess the safety of rate control as an outpatient. - Can transition to Eliquis  once she is cleared from a procedural standpoint. Continue oral Amio load -200 mg twice daily x2 weeks -> 200 mg once daily x2 weeks Transition to Eliquis  5 mg twice daily once it is determined whether or not she will undergo PEG tube placement or not Aggressive repletion of electrolytes keep K>4, Mg>2 Maintain telemetry  #Acute CVA - Patient admitted with acute CVA with neurology following. - Agree that she will benefit from Eliquis  given newly diagnosed A-fib. Management per neurology    [facility] will sign off.   Medication Recommendations: Start Eliquis  5 mg twice daily, continue amiodarone  as described  above, stop home metoprolol  while on Amio Other recommendations (labs, testing, etc):  N/A Follow up as an outpatient: Cardiology will arrange cardiology follow-up For questions or updates, please contact [facility] Please consult [email or web address] for contact info under       Signed, Georganna Archer, MD  02/03/2024, 6:49 AM    "

## 2024-02-03 NOTE — Evaluation (Signed)
 Speech Language Pathology Evaluation Patient Details Name: Angie Carr MRN: [id number] DOB: 1955-12-06 Today's Date: 02/03/2024 Time: 8884-8843 SLP Time Calculation (min) (ACUTE ONLY): 41 min  Problem List:  Patient Active Problem List   Diagnosis Date Noted   Atrial fibrillation with RVR (HCC) 02/02/2024   Pressure injury of skin 01/31/2024   Lactic acidosis 01/31/2024   Sepsis (HCC) 01/30/2024   Silent cerebral infarction (HCC) 01/30/2024   History of abdominal aortic aneurysm (AAA) 01/15/2024   Acute on chronic diastolic (congestive) heart failure (HCC) 01/14/2024   Benign esophageal stricture 11/20/2023   Hiatal hernia 11/20/2023   Protein-calorie malnutrition, severe 11/18/2023   Esophageal dysphagia 11/18/2023   Abdominal aortic aneurysm (AAA) >39 mm diameter 11/14/2023   Gastroesophageal reflux disease 11/14/2023   Generalized weakness 11/14/2023   History of pulmonary embolism 11/14/2023   Hypokalemia 11/13/2023   Dysgeusia 09/18/2023   Dehydration 09/15/2023   Hypomagnesemia 09/15/2023   Protein calorie malnutrition 09/15/2023   Uncontrolled type 2 diabetes mellitus with hypoglycemia, without long-term current use of insulin  (HCC) 09/15/2023   PAD (peripheral artery disease) 08/29/2023   Febrile illness 12/11/2014   Severe sepsis (HCC) 12/11/2014   Hypotension 12/11/2014   Acute kidney injury 12/11/2014   Trichomonal vaginitis    Absolute anemia    Tobacco abuse 11/04/2012   Diabetes mellitus, type 2 (HCC) 03/18/2012   Morbid obesity (HCC) 03/18/2012   Hypertension associated with diabetes (HCC) 03/18/2012   Mixed hyperlipidemia 03/18/2012   Arthritis 03/18/2012   Past Medical History:  Past Medical History:  Diagnosis Date   Anemia    Anxiety    Asthma    pt denies   Diabetes mellitus without complication (HCC)    DJD (degenerative joint disease)    lower back   GERD (gastroesophageal reflux disease)    Headache    miagraines, none in years    Hypertension    LBP (low back pain)    Morbid obesity (HCC)    Neuromuscular disorder (HCC)    feet tingle and her balance is sometimes a problem   Primary osteoarthritis of both knees    Smoker    Past Surgical History:  Past Surgical History:  Procedure Laterality Date   ABDOMINAL HYSTERECTOMY     complete   CHOLECYSTECTOMY     laparascopic   COLONOSCOPY N/A 02/11/2014   Procedure: COLONOSCOPY;  Surgeon: Lamar Donnald GAILS, MD;  Location: [facility];  Service: Endoscopy;  Laterality: N/A;   ESOPHAGOGASTRODUODENOSCOPY N/A 11/20/2023   Procedure: EGD (ESOPHAGOGASTRODUODENOSCOPY);  Surgeon: San Sandor GAILS, DO;  Location: [facility];  Service: Gastroenterology;  Laterality: N/A;  with possible dilation   HERNIA REPAIR     ventral on left side.   TOTAL HIP ARTHROPLASTY Left 07/24/2023   Procedure: ARTHROPLASTY, HIP, TOTAL,POSTERIOR APPROACH;  Surgeon: Edna Toribio LABOR, MD;  Location: [facility];  Service: Orthopedics;  Laterality: Left;   TUBAL LIGATION     HPI:  Angie Carr is a 69 y.o. female who presented with shortness of breath and leg swelling. Clemens out of bed twice. MRI showed Acute lacunar infarct in the right-side of the cerebellum.  Esophagogram 01/16/24 showed normal swallowing, with severe dysmotility disorder. During that admission pt was given esophageal precautions and recommended a mechanical soft diet, though pt reported that she has been drinking only liquids for the past 3-4 months and she has lost approximately 60 lbs during this time. MD currently considering PEG tube. Pt with medical history significant for HTN, T2DM, PE on  Eliquis , PAD, GERD, DJD, arthritis and anemia   Assessment / Plan / Recommendation Clinical Impression  Pt demonstrates significant cognitive impairment. Pt scored a 6 out of 30 on a cognitive screen (SLUMS) that would warrant further assessment for potential dementia.  Pt found to be unsuccessful in understanding her diagnosis or  recovery and unsuccessful in participating in plan of care or discharge planning. After SLP reviewed her recent medical history, with a focus on weight loss, struggle eating and esophageal dysphagia, pt was able to acknowledge her prior diagnosis but could still not participate in reasoning. After a 5 minute delay pt had no recall of prior teaching. Pt will likely need family support for decision making in plan of care. SLP will f/u.  Pt has a 10 second attention span with auditory input and no recall. Visual tasks sustain attention to 1 minute, but pt is unsuccessful in visuospatial skills.   Will set a goal that pt will be partially successful in understanding her diagnosis and participating plan of care after multimodal supports are given.     SLP Assessment  SLP Recommendation/Assessment: Patient needs continued Speech Language Pathology Services SLP Visit Diagnosis: Attention and concentration deficit     Assistance Recommended at Discharge  Frequent or constant Supervision/Assistance  Functional Status Assessment Patient has had a recent decline in their functional status and demonstrates the ability to make significant improvements in function in a reasonable and predictable amount of time.  Frequency and Duration min 2x/week  2 weeks      SLP Evaluation Cognition  Overall Cognitive Status: No family/caregiver present to determine baseline cognitive functioning Arousal/Alertness: Awake/alert Orientation Level: Oriented to person;Oriented to place;Disoriented to time;Disoriented to situation Attention: Focused;Sustained Focused Attention: Appears intact Sustained Attention: Impaired Sustained Attention Impairment: Verbal basic;Functional basic Memory: Impaired Memory Impairment: Storage deficit;Retrieval deficit;Decreased short term memory Decreased Short Term Memory: Verbal basic Awareness: Impaired Awareness Impairment: Intellectual impairment;Emergent impairment;Anticipatory  impairment Problem Solving: Impaired Problem Solving Impairment: Verbal basic Executive Function: Reasoning;Decision Making Reasoning: Impaired Reasoning Impairment: Verbal basic Decision Making: Appears intact       Comprehension  Auditory Comprehension Overall Auditory Comprehension: Appears within functional limits for tasks assessed    Expression Verbal Expression Overall Verbal Expression: Appears within functional limits for tasks assessed   Oral / Motor  Oral Motor/Sensory Function Overall Oral Motor/Sensory Function: Within functional limits Motor Speech Overall Motor Speech: Appears within functional limits for tasks assessed            Bea Duren, Consuelo Fitch 02/03/2024, 12:53 PM

## 2024-02-04 DIAGNOSIS — A419 Sepsis, unspecified organism: Secondary | ICD-10-CM | POA: Diagnosis not present

## 2024-02-04 DIAGNOSIS — E872 Acidosis, unspecified: Secondary | ICD-10-CM | POA: Diagnosis not present

## 2024-02-04 LAB — GLUCOSE, CAPILLARY
Glucose-Capillary: 101 mg/dL — ABNORMAL HIGH (ref 70–99)
Glucose-Capillary: 91 mg/dL (ref 70–99)
Glucose-Capillary: 90 mg/dL (ref 70–99)
Glucose-Capillary: 84 mg/dL (ref 70–99)
Glucose-Capillary: 72 mg/dL (ref 70–99)

## 2024-02-04 LAB — CULTURE, BLOOD (ROUTINE X 2): Culture: NO GROWTH

## 2024-02-04 LAB — BASIC METABOLIC PANEL WITH GFR
Anion gap: 11 (ref 5–15)
BUN: 12 mg/dL (ref 8–23)
CO2: 18 mmol/L — ABNORMAL LOW (ref 22–32)
Calcium: 7 mg/dL — ABNORMAL LOW (ref 8.9–10.3)
Chloride: 115 mmol/L — ABNORMAL HIGH (ref 98–111)
Creatinine, Ser: 0.82 mg/dL (ref 0.44–1.00)
GFR, Estimated: >60 mL/min
Glucose, Bld: 96 mg/dL (ref 70–99)
Potassium: 3.8 mmol/L (ref 3.5–5.1)
Sodium: 143 mmol/L (ref 135–145)

## 2024-02-04 LAB — MAGNESIUM: Magnesium: 2 mg/dL (ref 1.7–2.4)

## 2024-02-04 LAB — CBC
HCT: 28.5 % — ABNORMAL LOW (ref 36.0–46.0)
Hemoglobin: 9.9 g/dL — ABNORMAL LOW (ref 12.0–15.0)
MCH: 33.6 pg (ref 26.0–34.0)
MCHC: 34.7 g/dL (ref 30.0–36.0)
MCV: 96.6 fL (ref 80.0–100.0)
Platelets: 151 10*3/uL (ref 150–400)
RBC: 2.95 MIL/uL — ABNORMAL LOW (ref 3.87–5.11)
RDW: 21.1 % — ABNORMAL HIGH (ref 11.5–15.5)
WBC: 6.1 10*3/uL (ref 4.0–10.5)
nRBC: 0.3 % — ABNORMAL HIGH (ref 0.0–0.2)

## 2024-02-04 MED ORDER — MIRTAZAPINE 15 MG PO TABS
15.0000 mg | ORAL_TABLET | Freq: Every day | ORAL | Status: AC
  Administered 2024-02-04 – 2024-02-07 (×3): 15 mg via ORAL
  Filled 2024-02-04 (×4): qty 1

## 2024-02-04 MED ORDER — APIXABAN 5 MG PO TABS
5.0000 mg | ORAL_TABLET | Freq: Two times a day (BID) | ORAL | Status: AC
  Administered 2024-02-04 – 2024-02-07 (×6): 5 mg via ORAL
  Filled 2024-02-04 (×7): qty 1

## 2024-02-04 NOTE — TOC Initial Note (Signed)
 Transition of Care [facility]) - Initial/Assessment Note    Patient Details  Name: Angie Carr MRN: [id number] Date of Birth: January 15, 1955  [facility] CM/SW Contact:    Sherline Clack, LCSWA Phone Number: 02/04/2024, 4:23 PM  Clinical Narrative:                  CSW spoke with patient, nieces, and sister at bedside regarding anticipated discharge plan. CSW reviewed PT's SNF recommendation with family and family is agreeable to patient being faxed out to facilities in/near [place]. CSW filled out FL2 for patient and faxed out in the HUB. CSW will follow up with family once patient has bed offers. CSW will continue to follow.   Expected Discharge Plan: Skilled Nursing Facility Barriers to Discharge: SNF Pending bed offer, Insurance Authorization   Patient Goals and CMS Choice     Choice offered to / list presented to : Patient, Sibling (neice)      Expected Discharge Plan and Services       Living arrangements for the past 2 months: Single Family Home                                      Prior Living Arrangements/Services Living arrangements for the past 2 months: Single Family Home Lives with:: Adult Children Patient language and need for interpreter reviewed:: Yes                 Activities of Daily Living   ADL Screening (condition at time of admission) Independently performs ADLs?: No Does the patient have a NEW difficulty with bathing/dressing/toileting/self-feeding that is expected to last >3 days?: No Does the patient have a NEW difficulty with getting in/out of bed, walking, or climbing stairs that is expected to last >3 days?: No Does the patient have a NEW difficulty with communication that is expected to last >3 days?: No Is the patient deaf or have difficulty hearing?: No Does the patient have difficulty seeing, even when wearing glasses/contacts?: No Does the patient have difficulty concentrating, remembering, or  making decisions?: No  Permission Sought/Granted                  Emotional Assessment Appearance:: Appears stated age Attitude/Demeanor/Rapport: Engaged Affect (typically observed): Appropriate Orientation: : Oriented to Self, Oriented to Place, Oriented to Situation      Admission diagnosis:  Sepsis (HCC) [A41.9] Sepsis with acute organ dysfunction and septic shock, due to unspecified organism, unspecified organ dysfunction type (HCC) [A41.9, R65.21] Patient Active Problem List   Diagnosis Date Noted   Atrial fibrillation with RVR (HCC) 02/02/2024   Pressure injury of skin 01/31/2024   Lactic acidosis 01/31/2024   Sepsis (HCC) 01/30/2024   Silent cerebral infarction (HCC) 01/30/2024   History of abdominal aortic aneurysm (AAA) 01/15/2024   Acute on chronic diastolic (congestive) heart failure (HCC) 01/14/2024   Benign esophageal stricture 11/20/2023   Hiatal hernia 11/20/2023   Protein-calorie malnutrition, severe 11/18/2023   Esophageal dysphagia 11/18/2023   Abdominal aortic aneurysm (AAA) >39 mm diameter 11/14/2023   Gastroesophageal reflux disease 11/14/2023   Generalized weakness 11/14/2023   History of pulmonary embolism 11/14/2023   Hypokalemia 11/13/2023   Dysgeusia 09/18/2023   Dehydration 09/15/2023   Hypomagnesemia 09/15/2023   Protein calorie malnutrition 09/15/2023   Uncontrolled type 2 diabetes mellitus with hypoglycemia, without long-term current use of insulin  (HCC) 09/15/2023   PAD (peripheral  artery disease) 08/29/2023   Febrile illness 12/11/2014   Severe sepsis (HCC) 12/11/2014   Hypotension 12/11/2014   Acute kidney injury 12/11/2014   Trichomonal vaginitis    Absolute anemia    Tobacco abuse 11/04/2012   Diabetes mellitus, type 2 (HCC) 03/18/2012   Morbid obesity (HCC) 03/18/2012   Hypertension associated with diabetes (HCC) 03/18/2012   Mixed hyperlipidemia 03/18/2012   Arthritis 03/18/2012   PCP:  Benjamine Aland, MD Pharmacy:    [facility] [address] ([place]), [place] [address] [place] ([place]) [place] [address] Phone: [phone] Fax: [phone]  [facility] - [place], [place] - [address] [place] [address] Phone: [phone] Fax: [phone]  [facility] [address] [place] [address] Phone: [phone] Fax: [phone]  [facility] - [place], [place] - [address] [place] [address] Phone: [phone] Fax: [phone]     Social Drivers of Health (SDOH) Social History: SDOH Screenings   Food Insecurity: No Food Insecurity (01/30/2024)  Housing: Low Risk (01/30/2024)  Transportation Needs: No Transportation Needs (01/30/2024)  Utilities: Not At Risk (01/30/2024)  Depression (PHQ2-9): Low Risk (01/20/2024)  Financial Resource Strain: Low Risk (11/04/2023)  Physical Activity: Inactive (11/04/2023)  Social Connections: Moderately Integrated (01/30/2024)  Stress: No Stress Concern Present (11/04/2023)  Tobacco Use: High Risk (02/03/2024)  Health Literacy: Adequate Health Literacy (11/04/2023)   SDOH Interventions:     Readmission Risk Interventions    11/21/2023   10:33 AM 09/17/2023    3:57 PM 08/29/2023    2:12 PM  Readmission Risk Prevention Plan  Transportation Screening Complete Complete Complete  PCP or Specialist Appt within 5-7 Days Complete  Complete  Home Care Screening Complete Complete Complete  Medication Review (RN CM) Referral to Pharmacy Complete Referral to Pharmacy

## 2024-02-04 NOTE — Plan of Care (Signed)
  Problem: Fluid Volume: Goal: Hemodynamic stability will improve Outcome: Progressing   Problem: Clinical Measurements: Goal: Signs and symptoms of infection will decrease Outcome: Progressing   Problem: Respiratory: Goal: Ability to maintain adequate ventilation will improve Outcome: Progressing   Problem: Fluid Volume: Goal: Ability to maintain a balanced intake and output will improve Outcome: Progressing

## 2024-02-04 NOTE — Care Plan (Signed)
 Met with patient's Niece [name] who is primary decision maker moving forward , another niece and sister Ronal ar the bedside.  Palliative care conversation and plan of care with family and then with palliative care provider.  Family was very informed and aware about the prognosis moving forward.  Plan: DNR/DNI, currently continue care.  Cannot go home.  Will benefit with a SNF. If patient has any decompensation, readmissions to the hospital in the future, plan is to comfort care and hospice pathway. No PEG tube as per patient and family-resume Eliquis . Will start Remeron  at night for appetite stimulation. Family requested psychiatry consult to see if there is anything reversible or treatable.

## 2024-02-05 ENCOUNTER — Telehealth

## 2024-02-05 DIAGNOSIS — E872 Acidosis, unspecified: Secondary | ICD-10-CM | POA: Diagnosis not present

## 2024-02-05 DIAGNOSIS — A419 Sepsis, unspecified organism: Secondary | ICD-10-CM | POA: Diagnosis not present

## 2024-02-05 LAB — GLUCOSE, CAPILLARY
Glucose-Capillary: 75 mg/dL (ref 70–99)
Glucose-Capillary: 76 mg/dL (ref 70–99)
Glucose-Capillary: 80 mg/dL (ref 70–99)
Glucose-Capillary: 84 mg/dL (ref 70–99)
Glucose-Capillary: 94 mg/dL (ref 70–99)

## 2024-02-05 NOTE — Progress Notes (Signed)
 Psychiatric Nurse Liaison (PNL) Rounding Note  Current SI/HI/AVH: denies  Patient Mood/Affect: pleasant  Noted Patient Behaviors: sitting in bed in no apparent distress; denies any mental health issues; states she lives with her son and grandson and they are good support as well as her sister  Interventions Initiated by Psychiatric Nurse Liaison: therapeutic communication and emotional support  Recommendations for Patient Care: pt has a psych consult for depression; PNL visited to see if pt had any needs  Patients Response to Treatment: effective  Time Spent with Patient:   5 minutes   Loetta Pinal RN, BSN, RN-BC

## 2024-02-05 NOTE — Progress Notes (Signed)
 " PROGRESS NOTE    Angie Carr  FMW:[id number] DOB: [birth year] DOA: 01/29/2024 PCP: Benjamine Aland, MD    Brief Narrative:  69 year old with history of pulmonary embolism on Eliquis , type 2 diabetes, hypertension, chronic anemia, chronic diastolic dysfunction, GERD, peripheral artery disease, hyperlipidemia brought from home with about 4 days of lethargic, less responsiveness, minimum interaction and laying in bed all the time.  Also reported she might have fallen from bed once or twice.  In the emergency room blood pressures were stable.  Heart rate was stable.  Patient was on room air.  Normotensive.  Normothermic. Sodium 158, potassium 3.3, serum CO2 was 15, creatinine 1.77.  Mildly elevated transaminases.  Lactic acid was 7.2-6.5-7.4. Patient was resuscitated, blood cultures were drawn.  Patient was started on broad-spectrum antibiotics and IV fluids.  A subsequent MRI showed acute stroke.  Patient was admitted to ICU.  Seen by PCCM and neurology.  Of note, patient was recently admitted to the hospital 1/13-1/17 2026 with leg swelling and shortness of breath.  She was given transfusion.  Esophagogram with severe dysmotility disorder.  Treated with diuretics, blood transfusions, Eliquis  was resumed and was discharged home.  1/31: Patient developed rapid A-fib with low blood pressures.  Mostly asymptomatic.  Unable to tolerate beta-blockers or Cardizem.  Started on amiodarone  drip and converted to oral amiodarone .  2/3: goal of care discussions-decided to continue supportive care-SNF placement.  Subjective:  Patient seen and examined.  She is alert awake, forgetful.  Denies any complaints.  Food is untouched.  She tells me she will eat something but she will not elaborate.  Patient tells me she is not a big eater. See goal of care discussion from yesterday, family members at bedside and documentation from yesterday.   Assessment & Plan:   Acute metabolic encephalopathy: likely related  to severe electrolyte abnormalities, respiratory alkalosis, Mental status is improving.  She does have some delayed response and this is likely her baseline.  Lactic acidosis, septic shock present on admission: Lactic acid elevated as high as 7.4.   No definite source of infection.  Blood cultures and urine cultures negative so far.  Was on different antibiotics.  Discontinued. CT scan abdomen pelvis on arrival was without any acute findings.   CT scan right knee without any acute findings.   Encourage oral nutrition.  Electrolyte abnormalities, acute kidney injury: Patient on multiple GDMT, torsemide  at home.  Holding all antihypertensives. Potassium, persistently low.  On scheduled replacement. Magnesium  replaced.  Adequate today. Continue to monitor levels including magnesium  and phosphorus.  New onset A-fib with MCM:Zrynrjmipnhmjf with normal EF, grade 1 diastolic dysfunction. TSH normal.  6.02. Replacing potassium, phosphorus. Treated with amiodarone  infusion.  Now on amnio loading dose followed by maintenance dose.   Appreciate cardiology input Therapeutic on Eliquis .  Severe dehydration, hyperchloremic hypernatremia: Patient with severe esophageal dysmotility.  Presented with free water deficits.  Improved with dextrose  half-normal saline.  Off IV fluids. Patient has severe motility disorder of esophagus.  Encouraging nutrition and supportive care.  No artificial feeding tube. Started on Remeron  15 mg at night.  Metabolic acidosis with respiratory alkalosis: Primary cause unknown.  Repeat VBG is stable.  Salicylate level is normal.  Continue to monitor lactic acid until normalized.  Metformin  was recently discontinued.  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  Difficult to assess focal deficits. Clinical presentation, confusion CT scan of the head, chronic atrophy and chronic ischemic changes MRI of the brain,  acute lacunar infarct  on the right side. TSH 6 LDL 30 A1c 4.6 Patient with severe dysphagia and poor oral intake.  Does not suggest to be on any statins.  Patient does not need diabetic treatment.  Metformin  discontinued.  Work with PT OT to find next destination for therapies. Has history of PE.  On Eliquis .  Not sure she was consistently taking it. Back on Eliquis .  Discontinue aspirin .  History of PE: Patient was diagnosed with pulmonary embolism on 08/28/2023.  She has been on Eliquis  since then.  Patient has poor mobility and had significant thrombus load.  Will continue Eliquis  .  Type 2 diabetes: A1c 4.6.  Does not need any treatment.  Avoid hypoglycemia.  Encourage oral sugar intake.  Social: Lives at home with son who is disabled and other grandkids. Currently needing a lot of support.  Recommending SNF. Plan to transition to a SNF.  Palliative care to follow-up at a SNF. She goal of care discussion 2/3.  Currently DNR/DNI.  If patient further decompensates, plan is to serve with hospice care.    DVT prophylaxis: SCDs Start: 01/30/24 1458 SCDs Start: 01/30/24 0734 Place TED hose Start: 01/30/24 0734 apixaban  (ELIQUIS ) tablet 5 mg   Code Status: DNR/DNI.  Palliative care following. Family Communication: Patient niece Ms. Gwen, updated every day. Disposition Plan: Status is: Inpatient Remains inpatient appropriate because: Stable to transition to a SNF.   Consultants:  Neurology  Critical care  Palliative care   Procedures:  None   Antimicrobials:  Vancomycin  01/30/24----  1/31 Zosyn  1/29-----2/1     Objective: Vitals:   02/05/24 0045 02/05/24 0439 02/05/24 0442 02/05/24 0850  BP: 123/85  115/73 139/74  Pulse: 92  96 96  Resp: 15  15 16   Temp: 97.9 F (36.6 C)  97.9 F (36.6 C) 98 F (36.7 C)  TempSrc:    Oral  SpO2: 95%  100% 99%  Weight:  71.6 kg    Height:        Intake/Output Summary (Last 24 hours) at 02/05/2024 1120 Last data filed at 02/04/2024 1300 Gross per 24 hour   Intake --  Output 200 ml  Net -200 ml   Filed Weights   02/03/24 0623 02/04/24 0500 02/05/24 0439  Weight: 69 kg 69 kg 71.6 kg    Examination:  General: Frail and debilitated.  Looks fairly comfortable on interaction.  Alert and awake.  Mostly oriented but forgetful. Cardiovascular: S1-S2 normal.  Regular rate rhythm.  No murmur. Respiratory: Bilateral clear.  No added sounds. Gastrointestinal: Soft.  Nontender.  Bowel sounds present. Ext: Patient has chronic nonpitting edema both knees,     Data Reviewed: I have personally reviewed following labs and imaging studies  CBC: Recent Labs  Lab 01/30/24 0010 01/30/24 1030 01/30/24 1440 01/30/24 1701 01/31/24 0410 02/01/24 1033 02/02/24 0733 02/03/24 0604 02/04/24 0203  WBC 8.6  --  10.6*   < > 10.9* 6.3 5.9 5.8 6.1  NEUTROABS 7.0  --  9.1*  --   --   --   --   --   --   HGB 12.5   < > 9.2*   < > 9.1* 9.5* 10.3* 9.7* 9.9*  HCT 37.0   < > 27.2*   < > 27.0* 29.1* 31.9* 28.1* 28.5*  MCV 99.2  --  99.6   < > 99.3 102.5* 104.9* 97.2 96.6  PLT 121*  --  127*   < > 130* 101* 118* 128* 151   < > =  values in this interval not displayed.   Basic Metabolic Panel: Recent Labs  Lab 01/30/24 0735 01/30/24 1030 01/31/24 0410 02/01/24 1033 02/02/24 0733 02/03/24 0604 02/04/24 0203  NA 158*   < > 152* 144 142 141 143  K 3.3*   < > 3.7 3.0* 2.7* 3.0* 3.8  CL 114*   < > 117* 116* 111 112* 115*  CO2 15*   < > 20* 16* 14* 15* 18*  GLUCOSE 85   < > 149* 153* 93 84 96  BUN 40*   < > 38* 26* 18 13 12   CREATININE 1.77*   < > 1.49* 1.25* 1.11* 0.92 0.82  CALCIUM  7.6*   < > 6.5* 6.6* 6.5* 6.6* 7.0*  MG 1.4*  --  1.7 1.8  --  1.4* 2.0  PHOS 3.9  --  2.2* 2.2*  --  1.9*  --    < > = values in this interval not displayed.   GFR: Estimated Creatinine Clearance: 62.9 mL/min (by C-G formula based on SCr of 0.82 mg/dL). Liver Function Tests: Recent Labs  Lab 01/30/24 0010 01/30/24 0735 01/30/24 1439 01/31/24 0410 02/03/24 0604  AST  292* 298* 212* 118* 36  ALT 286* 290* 216* 181* 103*  ALKPHOS 214* 126 86 86 94  BILITOT 1.5* 1.5* 1.1 0.8 0.5  PROT 6.7 6.7 4.6* 4.5* 4.5*  ALBUMIN 3.5 3.5 2.4* 2.3* 2.3*   No results for input(s): LIPASE, AMYLASE in the last 168 hours. No results for input(s): AMMONIA in the last 168 hours. Coagulation Profile: Recent Labs  Lab 01/30/24 0010 01/30/24 1701  INR 1.1 1.3*   Cardiac Enzymes: Recent Labs  Lab 01/30/24 0110  CKTOTAL 48   BNP (last 3 results) Recent Labs    01/14/24 1327 01/30/24 0010  PROBNP 1,157.0* 4,016.0*   HbA1C: No results for input(s): HGBA1C in the last 72 hours.  CBG: Recent Labs  Lab 02/04/24 1748 02/04/24 2213 02/05/24 0055 02/05/24 0444 02/05/24 0908  GLUCAP 84 72 84 75 80   Lipid Profile: No results for input(s): CHOL, HDL, LDLCALC, TRIG, CHOLHDL, LDLDIRECT in the last 72 hours.  Thyroid  Function Tests: No results for input(s): TSH, T4TOTAL, FREET4, T3FREE, THYROIDAB in the last 72 hours.  Anemia Panel: No results for input(s): VITAMINB12, FOLATE, FERRITIN, TIBC, IRON, RETICCTPCT in the last 72 hours. Sepsis Labs: Recent Labs  Lab 01/30/24 2059 01/31/24 1202 01/31/24 1530 02/01/24 1438  LATICACIDVEN 3.9* 2.6* 3.1* 3.8*    Recent Results (from the past 240 hours)  Resp panel by RT-PCR (RSV, Flu A&B, Covid) Anterior Nasal Swab     Status: None   Collection Time: 01/29/24 10:55 PM   Specimen: Anterior Nasal Swab  Result Value Ref Range Status   SARS Coronavirus 2 by RT PCR NEGATIVE NEGATIVE Final   Influenza A by PCR NEGATIVE NEGATIVE Final   Influenza B by PCR NEGATIVE NEGATIVE Final    Comment: (NOTE) The Xpert Xpress SARS-CoV-2/FLU/RSV plus assay is intended as an aid in the diagnosis of influenza from Nasopharyngeal swab specimens and should not be used as a sole basis for treatment. Nasal washings and aspirates are unacceptable for Xpert Xpress  SARS-CoV-2/FLU/RSV testing.  Fact Sheet for Patients: bloggercourse.com  Fact Sheet for Healthcare Providers: seriousbroker.it  This test is not yet approved or cleared by the [place]  FDA and has been authorized for detection and/or diagnosis of SARS-CoV-2 by FDA under an Emergency Use Authorization (EUA). This EUA will remain in effect (meaning this test can  be used) for the duration of the COVID-19 declaration under Section 564(b)(1) of the Act, 21 U.S.C. section 360bbb-3(b)(1), unless the authorization is terminated or revoked.     Resp Syncytial Virus by PCR NEGATIVE NEGATIVE Final    Comment: (NOTE) Fact Sheet for Patients: bloggercourse.com  Fact Sheet for Healthcare Providers: seriousbroker.it  This test is not yet approved or cleared by the [place]  FDA and has been authorized for detection and/or diagnosis of SARS-CoV-2 by FDA under an Emergency Use Authorization (EUA). This EUA will remain in effect (meaning this test can be used) for the duration of the COVID-19 declaration under Section 564(b)(1) of the Act, 21 U.S.C. section 360bbb-3(b)(1), unless the authorization is terminated or revoked.  Performed at [facility], [address] [place], [place] [address]   Blood Culture (routine x 2)     Status: None   Collection Time: 01/30/24 12:09 AM   Specimen: BLOOD RIGHT ARM  Result Value Ref Range Status   Specimen Description BLOOD RIGHT ARM  Final   Special Requests   Final    BOTTLES DRAWN AEROBIC AND ANAEROBIC Blood Culture results may not be optimal due to an inadequate volume of blood received in culture bottles   Culture   Final    NO GROWTH 5 DAYS Performed at [facility], [address] [place], [place] [address]    Report Status 02/04/2024 FINAL  Final  Blood Culture (routine x 2)     Status: None   Collection Time: 01/30/24  12:12 AM   Specimen: BLOOD RIGHT ARM  Result Value Ref Range Status   Specimen Description BLOOD RIGHT ARM  Final   Special Requests NONE  Final   Culture   Final    TEST WILL BE CREDITED Performed at [facility], [address] [place], [place] [address]    Report Status 02/04/2024 FINAL  Final  MRSA Next Gen by PCR, Nasal     Status: Abnormal   Collection Time: 01/30/24  3:00 PM   Specimen: Nasal Mucosa; Nasal Swab  Result Value Ref Range Status   MRSA by PCR Next Gen DETECTED (A) NOT DETECTED Final    Comment: RESULT CALLED TO, READ BACK BY AND VERIFIED WITH: RN Harlene BIRCH on [phone] @1919  by SM (NOTE) The GeneXpert MRSA Assay (FDA approved for NASAL specimens only), is one component of a comprehensive MRSA colonization surveillance program. It is not intended to diagnose MRSA infection nor to guide or monitor treatment for MRSA infections. Test performance is not FDA approved in patients less than 46 years old. Performed at [facility], [address], [place], [place] [address]          Radiology Studies: No results found.       Scheduled Meds:  amiodarone   200 mg Oral BID   Followed by   NOREEN ON 02/16/2024] amiodarone   200 mg Oral Daily   apixaban   5 mg Oral BID   Chlorhexidine  Gluconate Cloth  6 each Topical Daily   feeding supplement  237 mL Oral BID BM   insulin  aspart  0-6 Units Subcutaneous Q4H   mirtazapine   15 mg Oral QHS   mupirocin  ointment   Nasal BID   mouth rinse  15 mL Mouth Rinse 4 times per day   sodium chloride  flush  3 mL Intravenous Q12H   sodium chloride  flush  3 mL Intravenous Q12H   Continuous Infusions:     LOS: 6  days       Renato Applebaum, MD [facility]   "

## 2024-02-05 NOTE — Plan of Care (Signed)
" °  Problem: Fluid Volume: Goal: Hemodynamic stability will improve Outcome: Progressing   Problem: Respiratory: Goal: Ability to maintain adequate ventilation will improve Outcome: Progressing   Problem: Metabolic: Goal: Ability to maintain appropriate glucose levels will improve Outcome: Progressing   Problem: Skin Integrity: Goal: Risk for impaired skin integrity will decrease Outcome: Progressing   Problem: Tissue Perfusion: Goal: Adequacy of tissue perfusion will improve Outcome: Progressing   Problem: Education: Goal: Knowledge of General Education information will improve Description: Including pain rating scale, medication(s)/side effects and non-pharmacologic comfort measures Outcome: Progressing   Problem: Clinical Measurements: Goal: Diagnostic test results will improve Outcome: Progressing Goal: Cardiovascular complication will be avoided Outcome: Progressing   Problem: Coping: Goal: Level of anxiety will decrease Outcome: Progressing   Problem: Elimination: Goal: Will not experience complications related to bowel motility Outcome: Progressing Goal: Will not experience complications related to urinary retention Outcome: Progressing   Problem: Pain Managment: Goal: General experience of comfort will improve and/or be controlled Outcome: Progressing   Problem: Safety: Goal: Ability to remain free from injury will improve Outcome: Progressing   Problem: Skin Integrity: Goal: Risk for impaired skin integrity will decrease Outcome: Progressing   Problem: Health Behavior/Discharge Planning: Goal: Goals will be collaboratively established with patient/family Outcome: Progressing   Problem: Self-Care: Goal: Verbalization of feelings and concerns over difficulty with self-care will improve Outcome: Progressing   "

## 2024-02-05 NOTE — TOC Progression Note (Signed)
 Transition of Care [facility]) - Progression Note    Patient Details  Name: Angie Carr MRN: [id number] Date of Birth: 03-Aug-1955  Transition of Care [facility] CM/SW Contact  Sherline Clack, [place] Phone Number: 02/05/2024, 4:35 PM  Clinical Narrative:     CSW spoke with patient's sister and niece at bedside. Family would like CSW to request insurance for [place]. CSW will accept facility in the [place] and submit insurance auth for SNF.   Expected Discharge Plan: Skilled Nursing Facility Barriers to Discharge: SNF Pending bed offer, Insurance Authorization               Expected Discharge Plan and Services       Living arrangements for the past 2 months: Single Family Home                                       Social Drivers of Health (SDOH) Interventions SDOH Screenings   Food Insecurity: No Food Insecurity (01/30/2024)  Housing: Low Risk (01/30/2024)  Transportation Needs: No Transportation Needs (01/30/2024)  Utilities: Not At Risk (01/30/2024)  Depression (PHQ2-9): Low Risk (01/20/2024)  Financial Resource Strain: Low Risk (11/04/2023)  Physical Activity: Inactive (11/04/2023)  Social Connections: Moderately Integrated (01/30/2024)  Stress: No Stress Concern Present (11/04/2023)  Tobacco Use: High Risk (02/03/2024)  Health Literacy: Adequate Health Literacy (11/04/2023)    Readmission Risk Interventions    11/21/2023   10:33 AM 09/17/2023    3:57 PM 08/29/2023    2:12 PM  Readmission Risk Prevention Plan  Transportation Screening Complete Complete Complete  PCP or Specialist Appt within 5-7 Days Complete  Complete  Home Care Screening Complete Complete Complete  Medication Review (RN CM) Referral to Pharmacy Complete Referral to Pharmacy

## 2024-02-05 NOTE — Progress Notes (Signed)
 " Daily Progress Note   Date: 02/05/2024   Patient Name: Angie Carr  DOB: February 05, 1955  MRN: [id number]  Age / Sex: 69 y.o., female  Attending Physician: Angie Coria, MD Primary Care Physician: Angie Aland, MD Admit Date: 01/29/2024 Length of Stay: 6 days  Reason for Follow-up: Establishing goals of care  Past Medical History:  Diagnosis Date   Anemia    Anxiety    Asthma    pt denies   Diabetes mellitus without complication (HCC)    DJD (degenerative joint disease)    lower back   GERD (gastroesophageal reflux disease)    Headache    miagraines, none in years   Hypertension    LBP (low back pain)    Morbid obesity (HCC)    Neuromuscular disorder (HCC)    feet tingle and her balance is sometimes a problem   Primary osteoarthritis of both knees    Smoker     Assessment & Plan:   HPI/Patient Profile:   70 y.o. female with past medical history of HTN, T2DM, PE on Eliquis , CHF admitted on 01/29/2024 with sepsis, electrolyte derangement, and acute metabolic encephalopathy. Per critical care consult note on 01/30/2024 by Cogar NP, patient presented with AMS and lethargy after 4 days of poor PO intake. 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 CT A/P on 01/27/2024 demonstrated severe hepatic steatosis and stable 2.5 cm ovoid lesion in the paraspinal lesion along the right lateral aspect of T9 vertebral body. CT head 01/30/2024 demonstrated chronic atrophic and ischemic changes.    Palliative medicine consulted for goals of care conversation.  SUMMARY OF RECOMMENDATIONS DNR limited Next of kin decision maker is Angie Carr Family would like the patient to attempt rehab, but open to having outpatient palliative following Palliative medicine will continue to follow  Symptom Management:  Primary team  Code Status: DNR - Limited (DNR/DNI)  Prognosis: Unable to determine  Discharge Planning: To Be Determined  Subjective:    Subjective: Chart Reviewed. Updates received. Patient Assessed. Created space and opportunity for patient  and family to explore thoughts and feelings regarding current medical situation.  Today's Discussion:  Met with the patient and family at bedside.  Patient denied any hallucinations at this time, but she did comment that she does remember seeing some bugs crawling all over her arms and legs during her previous conversations.   Reviewed her conversation from yesterday, family shared that they had some questions about whether the patient's insurance would cover hospice or rehab depending on what ever pathway they choose.  Reviewed with them that overall the patient is on primary care that either pathway should cover the patient.  Family also inquired about hospice and whether the patient would have to go to a facility or patient be able to go home.  Shared with the family that the patient can certainly go home or go to a facility but going to a facility would mean that the patient would most likely have less than a couple of weeks left to live, and I am uncertain at this time whether or not the patient would be able to be inpatient hospice criteria.  Family shares that they would like the patient to go home to see her grandchildren, and that they would be willing to take her home if it came to that point.  Reviewed with the family the overall plan from yesterday, family is appreciative of medical team's attempt to try to stimulate the  patient's appetite with mirtazapine  and that the primary team consulted psychiatry for the patient's mentation.  Patient's sister inquired about the patient's blood sugars and the concern that it would be too low as the patient is not eating, shared with her that yes that is a concern other medical team that the patient's blood sugar at this point continues to drop due to her poor p.o. intake, family inquired whether or not further intervention is required, shared with  them that certainly at this point since the patient is not yet on comfort care there will be continued intervention such as a glucose IV infusion in order to maintain the patient's blood sugar while the family continues to consider their options.  Review of Systems  All other systems reviewed and are negative.   Objective:   Primary Diagnoses: Present on Admission:  Sepsis (HCC)  Acute on chronic diastolic (congestive) heart failure (HCC)  Acute kidney injury  Gastroesophageal reflux disease  History of pulmonary embolism  Hypokalemia  Hypomagnesemia  Hypotension  Mixed hyperlipidemia  PAD (peripheral artery disease)  Severe sepsis (HCC)  Uncontrolled type 2 diabetes mellitus with hypoglycemia, without long-term current use of insulin  (HCC)  Silent cerebral infarction (HCC)   Vital Signs:  BP 139/74 (BP Location: Left Arm)   Pulse 96   Temp 98 F (36.7 C) (Oral)   Resp 16   Ht 5' 4 (1.626 m)   Wt 71.6 kg   SpO2 99%   BMI 27.09 kg/m   Physical Exam Constitutional:      Appearance: She is ill-appearing.  HENT:     Head: Normocephalic and atraumatic.     Nose: Nose normal.     Mouth/Throat:     Mouth: Mucous membranes are dry.  Eyes:     Extraocular Movements: Extraocular movements intact.  Cardiovascular:     Rate and Rhythm: Normal rate.     Pulses: Normal pulses.  Pulmonary:     Effort: Pulmonary effort is normal.  Skin:    General: Skin is warm and dry.  Neurological:     Mental Status: She is alert. She is disoriented.     Palliative Assessment/Data: 20%   Existing Vynca/ACP Documentation: None  Thank you for allowing us  to participate in the care of Angie Carr PMT will continue to support holistically.  I personally spent a total of 50 minutes in the care of the patient today including preparing to see the patient, getting/reviewing separately obtained history, counseling and educating, and documenting clinical information in the  EHR.   Angie Carr E Angie Angevine, PA-C  Palliative Medicine Team  Team Phone # [phone] (Nights/Weekends) 02/05/2024 2:46 PM  "

## 2024-02-05 NOTE — Progress Notes (Signed)
 Occupational Therapy Treatment Patient Details Name: Angie Carr MRN: [id number] DOB: 09-May-1955 Today's Date: 02/05/2024   History of present illness Pt is 69 year old presented to [facility] on  01/29/24 for AMS/lethargy. 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 Pt developed afib with rvr on 02/01/24. PMH - CHF, DM2 , HTN, PVD, PE , anxiety, asthma,  HTN, neuromuscular disorder, COVID, DJD, esophageal stenosis   OT comments  Pt making slow progress with functional goals. Pt agreeable to activity, pleasant, but with poor sequencing and poor awareness of deficits. Multimodal cues/assist required  to initiate tasks, decreased insight into body positioning. Pt pulling at IV saying she needs to remove it and asking where bathroom sink was so that she can get up and go to it. Mod A to sit EOB, initially Mod A for trunk support sitting EOB. progressed to min A/CGA with time and cues. Pt able to participate in grooming/hygiene and UB self care tasks min A -mod A. Multimodal cues for initiation and sequencing required throughout. Pt required total A to return to supine and for positioning to [facility]. OT will continue to follow acutely to maximize level of function and safety      If plan is discharge home, recommend the following:  Two people to help with walking and/or transfers;Two people to help with bathing/dressing/bathroom;Direct supervision/assist for medications management   Equipment Recommendations  Other (comment) (defer)    Recommendations for Other Services      Precautions / Restrictions Precautions Precautions: Fall;Other (comment) Recall of Precautions/Restrictions: Impaired Precaution/Restrictions Comments: Watch HR Restrictions Weight Bearing Restrictions Per Provider Order: No       Mobility Bed Mobility Overal bed mobility: Needs Assistance Bed Mobility: Supine to Sit, Sit to  Supine, Rolling Rolling: Mod assist   Supine to sit: Mod assist Sit to supine: Mod assist   General bed mobility comments: required cues to initiate LEs to EOB, mod A with cues to scoot hips forward once sitting    Transfers                   General transfer comment: deferred, will need +2 assist/mechanical lift     Balance Overall balance assessment: Needs assistance Sitting-balance support: Bilateral upper extremity supported, Feet unsupported Sitting balance-Leahy Scale: Poor Sitting balance - Comments: initially Mod A for trunk support sitting EOB. progressed to min A/CGA with time and cues Postural control: Posterior lean                                 ADL either performed or assessed with clinical judgement   ADL Overall ADL's : Needs assistance/impaired     Grooming: Wash/dry hands;Wash/dry face;Minimal assistance Grooming Details (indicate cue type and reason): pt required mod A progressing to min A for balance/support sitting at EOB Upper Body Bathing: Moderate assistance;Sitting       Upper Body Dressing : Moderate assistance;Sitting                     General ADL Comments: Limited by decreased activity, impaired cognition. Will need +2/mechanical lift for mobility    Extremity/Trunk Assessment Upper Extremity Assessment Upper Extremity Assessment: Generalized weakness   Lower Extremity Assessment Lower Extremity Assessment: Defer to PT evaluation        Vision Baseline Vision/History: 1 Wears glasses Ability to See  in Adequate Light: 0 Adequate Additional Comments: R side inattention observed   Perception     Praxis     Communication Communication Communication: Impaired Factors Affecting Communication: Reduced clarity of speech   Cognition Arousal: Alert Behavior During Therapy: Flat affect Cognition: Cognition impaired             OT - Cognition Comments: pleasant, poor sequencing and poor awareness of  deficits. Multimodal cues/assist to initiate tasks, decreased insight into body positioning. Pt pulling at IV saying she needs to remove it and asking where bathroom sink was so that she can get up and go to it                 Following commands: Impaired Following commands impaired: Follows one step commands inconsistently, Follows one step commands with increased time      Cueing   Cueing Techniques: Verbal cues, Tactile cues  Exercises      Shoulder Instructions       General Comments      Pertinent Vitals/ Pain       Pain Assessment Pain Assessment: Faces Faces Pain Scale: Hurts even more Pain Location: R LE Pain Descriptors / Indicators: Grimacing, Guarding Pain Intervention(s): Limited activity within patient's tolerance, Monitored during session, Repositioned  Home Living                                          Prior Functioning/Environment              Frequency  Min 2X/week        Progress Toward Goals  OT Goals(current goals can now be found in the care plan section)  Progress towards OT goals: Progressing toward goals     Plan      Co-evaluation                 AM-PAC OT 6 Clicks Daily Activity     Outcome Measure   Help from another person eating meals?: A Little Help from another person taking care of personal grooming?: A Little Help from another person toileting, which includes using toliet, bedpan, or urinal?: Total Help from another person bathing (including washing, rinsing, drying)?: A Lot Help from another person to put on and taking off regular upper body clothing?: A Lot Help from another person to put on and taking off regular lower body clothing?: Total 6 Click Score: 12    End of Session    OT Visit Diagnosis: Other abnormalities of gait and mobility (R26.89);Muscle weakness (generalized) (M62.81)   Activity Tolerance Patient limited by fatigue   Patient Left in bed;with call bell/phone  within reach;with bed alarm set   Nurse Communication Mobility status        Time: 8575-8550 OT Time Calculation (min): 25 min  Charges: OT General Charges $OT Visit: 1 Visit OT Treatments $Self Care/Home Management : 8-22 mins $Therapeutic Activity: 8-22 mins   Jacques Karna Loose 02/05/2024, 3:01 PM

## 2024-02-05 NOTE — Progress Notes (Signed)
 Psychiatric Nurse Liaison (PNL) Rounding Note  Current SI/HI/AVH: Denies SI;plan and intent, denies HI and AVH  Patient Mood/Affect: Pleasant  Noted Patient Behaviors: Patient is in bed watching tv, pleasant and cooperative. Pt states I am feeling better. Denies any needs at this time   Interventions Initiated by Psychiatric Nurse Liaison: Therapeutic communication.  Recommendations for Patient Care: No new recommendations.   Patients Response to Treatment: Effective  Time Spent with Patient:   5 minutes

## 2024-02-05 NOTE — Consult Note (Signed)
 [facility] Psychiatric Consult Initial  Patient Name: .Angie Carr  MRN: [id number]  DOB: [birth year]  Consult Order details:  Orders (From admission, onward)     Start     Ordered   02/04/24 1610  IP CONSULT TO PSYCHIATRY       Ordering Provider: Raenelle Coria, MD  Provider:  (Not yet assigned)  Question Answer Comment  Location [facility]   Reason for Consult? apathy,depression      02/04/24 1609             Mode of Visit: In person    Psychiatry Consult Evaluation  Service Date: February 05, 2024 LOS:  LOS: 6 days  Chief Complaint apathy,depression  Primary Psychiatric Diagnoses  Metabolic encephalopathy in the setting of unspecified major neurocognitive disorder (SLUMS 6/30)   Assessment  Angie Carr is a 69 y.o. female with a past medical history of tobacco use disorder, HTN, T2DM, CHF, severe esophageal dysmotility, PE on Eliquis  and no past psychiatric history presented 1/28 with a 4 day history of altered mental status and lethargy in hte setting of pungent urine, emesis and fever.  Her current presentation of reduced PO intake and confusion is most consistent with metabolic encephalopathy in the setting of unspecified major neurocognitive disorder Current outpatient psychotropic medications include none. She was likely not compliant with medications prior to admission as evidenced by collateral report.   On initial examination, patient denied depressive, anxious, and psychotic symptomatology. She was very pleasant and welcoming. She was oriented to self and city, but not to place (down the street). Over the last 3-4 months patient's appetite has declined significantly. Multiple episodes of nausea, vomiting and diarrhea. She has lost ~60 lbs over the last three to four months. Previously, while in the hospital, she has refused meals as she is not hungry. Esophagram was significant for severe dysphagia.   Major depression in the  geriatric population can present very similarly to a major neurocognitive disorder. Cognitive slowing, poor appetite, poor sleep, reduced interest in activities are common in both. In this patient, however, absence of reported depressed mood, absence of psychiatric history aside from unspecified anxiety, a recent SLUMS of 6/30, and diffuse cerebral atrophy on imaging make an underlying organic etiology virtually certain.  Per niece, patient has been more isolative and has underwent significant family losses in recent years-it is very possible that there was a contributing element of depressed mood in the lead up to current presentation.  However, there is not sufficient evidence at this time to provide a new psychiatric diagnosis given our interview today, and the utility of such a diagnosis would be questionable in any case.  She reports a small improvement in appetite today, which may be a result of mirtazapine  and/or improved overall status s/p fluid resuscitation and resolution of metabolic derangements.   We will therefore recommend continuing mirtazapine . No other psychiatric medication adjustments are indicated at this time.   Believe that aggressive interventions, including percutaneous feeding, at this stage in the progression of patient's organic disease would serve only to disturb patient's current (comfortable-appearing) state without meaningful prognostic or QOL benefit. Appreciate very much the work of the palliative care team in this patient's care.   Please see plan below for detailed recommendations.   Diagnoses:  Active Hospital problems: Principal Problem:   Sepsis (HCC) Active Problems:   Mixed hyperlipidemia   Severe sepsis (HCC)   Hypotension   Acute kidney injury   PAD (peripheral artery disease)  Hypomagnesemia   Uncontrolled type 2 diabetes mellitus with hypoglycemia, without long-term current use of insulin  (HCC)   Hypokalemia   Gastroesophageal reflux disease    Generalized weakness   History of pulmonary embolism   Benign esophageal stricture   Acute on chronic diastolic (congestive) heart failure (HCC)   History of abdominal aortic aneurysm (AAA)   Silent cerebral infarction (HCC)   Pressure injury of skin   Lactic acidosis   Atrial fibrillation with RVR (HCC)    Plan   ## Psychiatric Medication Recommendations:  - Continue mirtazapine  15 mg at bedtime for appetite stimulation and sleep.   ## Medical Decision Making Capacity: Not specifically addressed in this encounter  ## Further Work-up:  -- most recent EKG on 1/31 had QtC of 386 -- Pertinent labwork reviewed earlier this admission includes: HgB 9.9,  K 2.7 --> 3.8, Na 168 --> 143   ## Disposition:-- There are no psychiatric contraindications to discharge at this time  ## Behavioral / Environmental: -Delirium Precautions: Delirium Interventions for Nursing and Staff: - RN to open blinds every AM. - To Bedside: Glasses, hearing aide, and pt's own shoes. Make available to patients. when possible and encourage use. - Encourage po fluids when appropriate, keep fluids within reach. - OOB to chair with meals. - Passive ROM exercises to all extremities with AM & PM care. - RN to assess orientation to person, time and place QAM and PRN. - Recommend extended visitation hours with familiar family/friends as feasible. - Staff to minimize disturbances at night. Turn off television when pt asleep or when not in use.    ## Safety and Observation Level:  - Based on my clinical evaluation, I estimate the patient to be at low risk of self harm in the current setting. - At this time, we recommend  routine. This decision is based on my review of the chart including patient's history and current presentation, interview of the patient, mental status examination, and consideration of suicide risk including evaluating suicidal ideation, plan, intent, suicidal or self-harm behaviors, risk factors, and protective  factors. This judgment is based on our ability to directly address suicide risk, implement suicide prevention strategies, and develop a safety plan while the patient is in the clinical setting. Please contact our team if there is a concern that risk level has changed.  CSSR Risk Category:C-SSRS RISK CATEGORY: No Risk  Suicide Risk Assessment: Patient has following modifiable risk factors for suicide: none, which we are addressing by n/a. Patient has following non-modifiable or demographic risk factors for suicide: none. Patient has the following protective factors against suicide: Supportive family and no history of suicide attempts  Thank you for this consult request. Recommendations have been communicated to the primary team.  We will sign off at this time.   Jaynia Fendley, MD       History of Present Illness  Relevant Aspects of Hospital Course:  Angie Carr is a 69 y.o. female with a past medical history of tobacco use disorder, HTN, T2DM, CHF, severe esophageal dysmotility, PE on Eliquis  and no past psychiatric history presented 1/28 with a 4 day history of altered mental status and lethargy in the setting of pungent urine, emesis and fever. Lives with son and grandson. Found to be severely volume depleted, hypernatremic to 158 with a lactic acidosis of 7.2. Admitted under sepsis criteria. S/p aggressive fluid resuscitation and broad-spectrum antibiotics. Head imaging 1/29 showed acute lacunar infarct on right side of cerebellum as we as chronic atrophic/ischemic  changes deemed by Dr. Rosemarie to be unrelated to current changes. Severe dysphagia was noted on esophagram.     Patient Report:   Patient was laying in hospital bed, chronically ill-appearing but attentive and alert to interview.  Patient denies all current psychiatric symptomatology although may have been depressed approximately 2 weeks ago.  On further discussion, she is unsure whether or not she felt a change in her  mood or whether this was part of her physical symptomatology at the time (weight loss, nausea/vomiting/diarrhea).  Reports no depressed mood at present.  No psychotic symptoms or SI/HI.  Is oriented to self and year (last year was 2025), but not to place (I am down the street.)  Very polite and accommodating throughout -- no acute concerns regarding pain -- perhaps slightly increased appetite today.  Psych ROS:  Depression: Denies Anxiety:  Denies presently Mania (lifetime and current): Denies presently Psychosis: (lifetime and current): Denies  Collateral information:  Contacted [name] Hopping, Niece at [phone] on 02/05/2024 @10 :03: Stepped in largely within the last two weeks. Patient is more isolative than previously. Lost two children. Third child is disabled. Lost husband and sister within the last three to four years. Has been quote-unquote caring for son, making sure that he had a nurse but unable to provide comprehensive care due to her own physical limitations.  Family requested a psychiatric evaluation because she has never had a psychiatric evaluation before, and that it would provide peace of mind to family to cover all bases. Patient was laughing and smiling yesterday, although her affect is overall flat.  Family at this time not interested in invasive procedures -- plan is for discharge to rehab and monitoring current progress.  Review of Systems  All other systems reviewed and are negative.    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 rate 16, height 5' 4 (1.626 m), weight 71.6 kg, SpO2 99%. Body mass index is 27.09 kg/m.  Physical Exam Constitutional:      General: She is not in acute distress.    Appearance: She is not diaphoretic.     Comments: Chronically ill-appearing, cachectic   HENT:     Head: Normocephalic and atraumatic.  Eyes:     General: No scleral icterus. Pulmonary:     Effort: Pulmonary effort is normal. No respiratory distress.  Musculoskeletal:        General: Normal range of motion.  Neurological:     Mental Status: She is alert.     Mental Status Exam: General Appearance: Fairly Groomed older woman, chronically ill appearing, laying in bed  Orientation:  Other:  to self, city but not to  place (down the street)  Memory:  Recent;   Poor  Concentration:  Concentration: Poor  Recall:  Poor  Attention  Good  Eye Contact:  Good  Speech:  Clear and Coherent  Language:  Good  Volume:  Normal  Mood: I'm fine  Affect:  Appropriate and Constricted  Thought Process:  Coherent, Goal Directed, and Linear  Thought Content:  Logical  Suicidal Thoughts:  No  Homicidal Thoughts:  No  Judgement:  Fair  Insight:  Lacking  Psychomotor Activity:  Normal  Akathisia:  No  Fund of Knowledge:  Fair      Assets:  Manufacturing Systems Engineer Desire for Improvement Housing Intimacy Resilience Social Support  Cognition:  Impaired,  Severe  ADL's:  Impaired  AIMS (if indicated):        Other History   These have been pulled in through the  EMR, reviewed, and updated if appropriate.  Family History:  The patient's family history includes Hypertension in an other family member; Stroke in an other family member.  Medical History: Past Medical History:  Diagnosis Date   Anemia    Anxiety    Asthma    pt denies   Diabetes mellitus without complication (HCC)    DJD (degenerative joint disease)    lower back   GERD (gastroesophageal reflux disease)    Headache    miagraines, none in years   Hypertension    LBP (low back pain)    Morbid obesity (HCC)    Neuromuscular disorder (HCC)    feet tingle and her balance is sometimes a problem   Primary osteoarthritis of both knees    Smoker     Surgical History: Past Surgical History:  Procedure Laterality Date   ABDOMINAL HYSTERECTOMY     complete   CHOLECYSTECTOMY     laparascopic   COLONOSCOPY N/A 02/11/2014   Procedure: COLONOSCOPY;  Surgeon: Lamar Donnald GAILS, MD;  Location: [facility];  Service: Endoscopy;  Laterality: N/A;   ESOPHAGOGASTRODUODENOSCOPY N/A 11/20/2023   Procedure: EGD (ESOPHAGOGASTRODUODENOSCOPY);  Surgeon: San Sandor GAILS, DO;  Location: [facility];  Service: Gastroenterology;  Laterality: N/A;  with possible dilation   HERNIA REPAIR     ventral on left side.   TOTAL HIP ARTHROPLASTY Left 07/24/2023   Procedure: ARTHROPLASTY, HIP, TOTAL,POSTERIOR APPROACH;  Surgeon: Edna Toribio LABOR, MD;  Location: [facility];  Service: Orthopedics;  Laterality: Left;   TUBAL LIGATION       Medications:  Current Medications[1]  Allergies: Allergies[2]  Arinze Rivadeneira, MD     [1]  Current Facility-Administered Medications:    acetaminophen  (TYLENOL ) tablet 650 mg, 650 mg, Oral, Q6H PRN, 650 mg at 01/31/24 1540 **OR** acetaminophen  (TYLENOL ) suppository 650 mg, 650 mg, Rectal, Q6H PRN, Shahmehdi, Seyed A, MD   amiodarone  (PACERONE ) tablet 200 mg, 200 mg, Oral, BID, 200 mg at 02/04/24 2154 **FOLLOWED BY** [START ON 02/16/2024] amiodarone  (PACERONE )  tablet 200 mg, 200 mg, Oral, Daily, Kate Lonni CROME, MD   apixaban  (ELIQUIS ) tablet 5 mg, 5 mg, Oral, BID, Ghimire, Kuber, MD, 5 mg at 02/04/24 1808   Chlorhexidine  Gluconate Cloth 2 % PADS 6 each, 6 each, Topical, Daily, Hussein, Lenny, MD, 6 each at 02/04/24 2155   feeding supplement (ENSURE PLUS HIGH PROTEIN) liquid 237 mL, 237 mL, Oral, BID BM, Ghimire, Kuber, MD, 237 mL at 02/04/24 0857   HYDROmorphone  (DILAUDID ) injection 0.5-1 mg, 0.5-1 mg, Intravenous, Q2H PRN, Shahmehdi, Seyed A, MD, 1 mg at 02/01/24 0245   insulin  aspart (novoLOG ) injection  0-6 Units, 0-6 Units, Subcutaneous, Q4H, Ghimire, Kuber, MD, 1 Units at 02/01/24 1639   ipratropium (ATROVENT ) nebulizer solution 0.5 mg, 0.5 mg, Nebulization, Q6H PRN, Shahmehdi, Seyed A, MD   mirtazapine  (REMERON ) tablet 15 mg, 15 mg, Oral, QHS, Ghimire, Kuber, MD, 15 mg at 02/04/24 2154   mupirocin  ointment (BACTROBAN ) 2 %, , Nasal, BID, Denninger, Jade M, RPH, Given at 02/04/24 2154   ondansetron  (ZOFRAN ) tablet 4 mg, 4 mg, Oral, Q6H PRN **OR** ondansetron  (ZOFRAN ) injection 4 mg, 4 mg, Intravenous, Q6H PRN, Shahmehdi, Seyed A, MD   Oral care mouth rinse, 15 mL, Mouth Rinse, 4 times per day, Raenelle Coria, MD, 15 mL at 02/04/24 2155   Oral care mouth rinse, 15 mL, Mouth Rinse, PRN, Raenelle Coria, MD   oxyCODONE  (Oxy IR/ROXICODONE ) immediate release tablet 5 mg, 5 mg, Oral, Q4H PRN, Shahmehdi, Seyed A, MD, 5 mg at 02/03/24 9047   senna-docusate (Senokot-S) tablet 1 tablet, 1 tablet, Oral, QHS PRN, Shahmehdi, Seyed A, MD   sodium chloride  flush (NS) 0.9 % injection 3 mL, 3 mL, Intravenous, Q12H, Shahmehdi, Seyed A, MD, 3 mL at 02/04/24 2155   sodium chloride  flush (NS) 0.9 % injection 3 mL, 3 mL, Intravenous, Q12H, Shahmehdi, Seyed A, MD, 3 mL at 02/04/24 2154   traZODone  (DESYREL ) tablet 25 mg, 25 mg, Oral, QHS PRN, Shahmehdi, Seyed A, MD [2] No Known Allergies

## 2024-02-05 NOTE — Care Management Important Message (Signed)
 Important Message  Patient Details  Name: Angie Carr MRN: [id number] Date of Birth: 07/23/1955   Important Message Given:  Yes - Medicare IM     Claretta Deed 02/05/2024, 3:46 PM

## 2024-02-06 LAB — GLUCOSE, CAPILLARY: Glucose-Capillary: 73 mg/dL (ref 70–99)

## 2024-02-06 MED ORDER — ACETAMINOPHEN 325 MG PO TABS
650.0000 mg | ORAL_TABLET | Freq: Three times a day (TID) | ORAL | Status: AC
  Administered 2024-02-06 – 2024-02-07 (×4): 650 mg via ORAL
  Filled 2024-02-06 (×4): qty 2

## 2024-02-06 NOTE — Progress Notes (Signed)
 " Daily Progress Note   Date: 02/06/2024   Patient Name: Angie Carr  DOB: Jul 28, 1955  MRN: [id number]  Age / Sex: 69 y.o., female  Attending Physician: Raenelle Coria, MD Primary Care Physician: Benjamine Aland, MD Admit Date: 01/29/2024 Length of Stay: 7 days  Reason for Follow-up: Establishing goals of care  Past Medical History:  Diagnosis Date   Anemia    Anxiety    Asthma    pt denies   Diabetes mellitus without complication (HCC)    DJD (degenerative joint disease)    lower back   GERD (gastroesophageal reflux disease)    Headache    miagraines, none in years   Hypertension    LBP (low back pain)    Morbid obesity (HCC)    Neuromuscular disorder (HCC)    feet tingle and her balance is sometimes a problem   Primary osteoarthritis of both knees    Smoker     Assessment & Plan:   HPI/Patient Profile:   69 y.o. female with past medical history of HTN, T2DM, PE on Eliquis , CHF admitted on 01/29/2024 with sepsis, electrolyte derangement, and acute metabolic encephalopathy. Per critical care consult note on 01/30/2024 by Cogar NP, patient presented with AMS and lethargy after 4 days of poor PO intake. 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 CT A/P on 01/27/2024 demonstrated severe hepatic steatosis and stable 2.5 cm ovoid lesion in the paraspinal lesion along the right lateral aspect of T9 vertebral body. CT head 01/30/2024 demonstrated chronic atrophic and ischemic changes.   Palliative medicine consulted for goals of care conversation.   SUMMARY OF RECOMMENDATIONS  DNR-limited Next of kin decision maker is [name] Hopping Continue current measures with goal of rehab with outpatient palliative following Schedule Tylenol  650 mg TID Palliative medicine will continue to follow  Symptom Management:  Tylenol  650 mg TID for back and arthritic pain  Code Status: DNR - Limited (DNR/DNI)  Prognosis: Unable to determine  Discharge  Planning: Skilled Nursing Facility for rehab with Palliative care service follow-up  Discussed with: Ghimire MD 02/06/2024 about scheduling the tylenol  for the patient's back pain and leg pain.   Subjective:   Subjective: Chart Reviewed. Updates received. Patient Assessed. Created space and opportunity for patient  and family to explore thoughts and feelings regarding current medical situation.  Today's Discussion:  Met with the patient at bedside with niece and sister present. Patient reports back and leg pain, family shares patient has been moaning from the pain. Patient agreeable to begin scheduled tylenol  for pain management. Family requested inspection of the patient's left AC IV reporting that it is too tight. Assessed and it did not look inflamed, requested for RN to assess to see if it is still flushing. Family and patient had no other questions. PO intake is diminished, will take in ensure occasionally.   Review of Systems  Musculoskeletal:  Positive for arthralgias and back pain.    Objective:   Primary Diagnoses: Present on Admission:  Sepsis (HCC)  Acute on chronic diastolic (congestive) heart failure (HCC)  Acute kidney injury  Gastroesophageal reflux disease  History of pulmonary embolism  Hypokalemia  Hypomagnesemia  Hypotension  Mixed hyperlipidemia  PAD (peripheral artery disease)  Severe sepsis (HCC)  Uncontrolled type 2 diabetes mellitus with hypoglycemia, without long-term current use of insulin  (HCC)  Silent cerebral infarction (HCC)   Vital Signs:  BP 127/78 (BP Location: Left Wrist)   Pulse 100   Temp 98.9  F (37.2 C)   Resp 15   Ht 5' 4 (1.626 m)   Wt 70.9 kg   SpO2 100%   BMI 26.83 kg/m   Physical Exam Constitutional:      Appearance: She is ill-appearing.  HENT:     Head: Normocephalic and atraumatic.     Nose: Nose normal.     Mouth/Throat:     Mouth: Mucous membranes are dry.  Eyes:     Extraocular Movements: Extraocular movements  intact.  Cardiovascular:     Rate and Rhythm: Normal rate.  Pulmonary:     Effort: Pulmonary effort is normal.  Skin:    General: Skin is warm and dry.  Neurological:     Mental Status: She is alert. She is disoriented.  Psychiatric:        Mood and Affect: Mood normal.    Palliative Assessment/Data: 20%   Existing Vynca/ACP Documentation: None  Thank you for allowing us  to participate in the care of BO ROGUE PMT will continue to support holistically.  I personally spent a total of 35 minutes in the care of the patient today including preparing to see the patient, performing a medically appropriate exam/evaluation, counseling and educating, placing orders, referring and communicating with other health care professionals, and documenting clinical information in the EHR.  Aislynn Cifelli E Dareen Gutzwiller, PA-C  Palliative Medicine Team  Team Phone # [phone] (Nights/Weekends) 02/06/2024 1:23 PM  "

## 2024-02-06 NOTE — Progress Notes (Signed)
 Physical Therapy Treatment Patient Details Name: Angie Carr MRN: [id number] DOB: Jan 25, 1955 Today's Date: 02/06/2024   History of Present Illness Pt is 69 year old presented to [facility] on  01/29/24 for AMS/lethargy. 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 Pt developed afib with rvr on 02/01/24. PMH - CHF, DM2 , HTN, PVD, PE , anxiety, asthma,  HTN, neuromuscular disorder, COVID, DJD, esophageal stenosis    PT Comments  Pt received in supine and confused, but agreeable to session. Pt noted to be soiled in stool and requires total A for pericare and linen change. Pt able to perform rolling and sitting to EOB with mod A and increased time, but requires up to max A to return to supine due to decreased initiation and weakness. Pt limited in B knee flexion. Attempted to scoot along EOB, but unable to safely due to posterior lean and weakness. Pt continues to benefit from PT services to progress toward functional mobility goals.    If plan is discharge home, recommend the following: Two people to help with walking and/or transfers;Two people to help with bathing/dressing/bathroom;Assist for transportation;Assistance with cooking/housework   Can travel by private vehicle     No  Equipment Recommendations  Wheelchair (measurements PT);Wheelchair cushion (measurements PT);Hoyer lift    Recommendations for Other Services       Precautions / Restrictions Precautions Precautions: Fall;Other (comment) Recall of Precautions/Restrictions: Impaired Precaution/Restrictions Comments: Watch HR Restrictions Weight Bearing Restrictions Per Provider Order: No     Mobility  Bed Mobility Overal bed mobility: Needs Assistance Bed Mobility: Supine to Sit, Sit to Supine, Rolling Rolling: Mod assist   Supine to sit: Mod assist Sit to supine: Max assist   General bed mobility comments: cues for sequencing  and awareness throughout. difficulty reaching LUE across to rail    Transfers                   General transfer comment: deferred due to lack of +2 and B knee pain with flexion    Ambulation/Gait                   Stairs             Wheelchair Mobility     Tilt Bed    Modified Rankin (Stroke Patients Only)       Balance Overall balance assessment: Needs assistance Sitting-balance support: Bilateral upper extremity supported, Feet supported Sitting balance-Leahy Scale: Poor Sitting balance - Comments: realiant on BUE behind for support with slight posterior lean                                    Communication Communication Communication: Impaired Factors Affecting Communication: Reduced clarity of speech  Cognition Arousal: Alert Behavior During Therapy: WFL for tasks assessed/performed   PT - Cognitive impairments: Awareness, Orientation, Problem solving, Safety/Judgement, Sequencing, Initiation, Memory                       PT - Cognition Comments: Pt confused, but pleasant and able to follow commands with increased time Following commands: Impaired Following commands impaired: Follows one step commands inconsistently, Follows one step commands with increased time    Cueing Cueing Techniques: Verbal cues, Tactile cues  Exercises      General Comments  Pertinent Vitals/Pain Pain Assessment Pain Assessment: Faces Faces Pain Scale: Hurts little more Pain Location: B knees with flexion L>R Pain Descriptors / Indicators: Grimacing, Guarding Pain Intervention(s): Limited activity within patient's tolerance, Monitored during session, Repositioned     PT Goals (current goals can now be found in the care plan section) Acute Rehab PT Goals Patient Stated Goal: Pt unable to state PT Goal Formulation: Patient unable to participate in goal setting Time For Goal Achievement: 02/15/24 Progress towards PT goals:  Progressing toward goals    Frequency    Min 2X/week       AM-PAC PT 6 Clicks Mobility   Outcome Measure  Help needed turning from your back to your side while in a flat bed without using bedrails?: A Lot Help needed moving from lying on your back to sitting on the side of a flat bed without using bedrails?: A Lot Help needed moving to and from a bed to a chair (including a wheelchair)?: Total Help needed standing up from a chair using your arms (e.g., wheelchair or bedside chair)?: Total Help needed to walk in hospital room?: Total Help needed climbing 3-5 steps with a railing? : Total 6 Click Score: 8    End of Session   Activity Tolerance: Patient limited by pain;Patient limited by fatigue Patient left: in bed;with call bell/phone within reach;with nursing/sitter in room Nurse Communication: Mobility status PT Visit Diagnosis: Other abnormalities of gait and mobility (R26.89);Muscle weakness (generalized) (M62.81);Pain Pain - Right/Left: Right Pain - part of body: Hip;Knee     Time: 8959-8887 PT Time Calculation (min) (ACUTE ONLY): 32 min  Charges:    $Therapeutic Activity: 23-37 mins PT General Charges $$ ACUTE PT VISIT: 1 Visit                    Darryle George, PTA Acute Rehabilitation Services Secure Chat Preferred  [phone]    Darryle George 02/06/2024, 11:27 AM

## 2024-02-06 NOTE — TOC Progression Note (Addendum)
 Transition of Care [facility]) - Progression Note    Patient Details  Name: Angie Carr MRN: [id number] Date of Birth: Dec 08, 1955  Transition of Care [facility]) CM/SW Contact  Sherline Clack, [place] Phone Number: 02/06/2024, 11:02 AM  Clinical Narrative:     Update 3:57 PM: patient's shara was approved 2/5-2/9. CSW reached out to [facility] for bed availability and they are able to offer bed for patient on Monday. Rhonda with [facility] will update CSW if patient is able to transfer at an earlier date. Provider and family made aware.   CSW submitted SNF insurance authorization for patient. The shara is currently pending, auth ID: [id number]. CSW will continue to monitor and udpate DC plan.   Expected Discharge Plan: Skilled Nursing Facility Barriers to Discharge: SNF Pending bed offer, Insurance Authorization               Expected Discharge Plan and Services       Living arrangements for the past 2 months: Single Family Home                                       Social Drivers of Health (SDOH) Interventions SDOH Screenings   Food Insecurity: No Food Insecurity (01/30/2024)  Housing: Low Risk (01/30/2024)  Transportation Needs: No Transportation Needs (01/30/2024)  Utilities: Not At Risk (01/30/2024)  Depression (PHQ2-9): Low Risk (01/20/2024)  Financial Resource Strain: Low Risk (11/04/2023)  Physical Activity: Inactive (11/04/2023)  Social Connections: Moderately Integrated (01/30/2024)  Stress: No Stress Concern Present (11/04/2023)  Tobacco Use: High Risk (02/03/2024)  Health Literacy: Adequate Health Literacy (11/04/2023)    Readmission Risk Interventions    11/21/2023   10:33 AM 09/17/2023    3:57 PM 08/29/2023    2:12 PM  Readmission Risk Prevention Plan  Transportation Screening Complete Complete Complete  PCP or Specialist Appt within 5-7 Days Complete  Complete  Home Care Screening Complete Complete Complete  Medication Review (RN CM) Referral to  Pharmacy Complete Referral to Pharmacy

## 2024-02-06 NOTE — Plan of Care (Signed)
   Problem: Fluid Volume: Goal: Hemodynamic stability will improve Outcome: Progressing   Problem: Clinical Measurements: Goal: Diagnostic test results will improve Outcome: Progressing Goal: Signs and symptoms of infection will decrease Outcome: Progressing   Problem: Respiratory: Goal: Ability to maintain adequate ventilation will improve Outcome: Progressing

## 2024-02-06 NOTE — Progress Notes (Signed)
 " PROGRESS NOTE    Angie Carr  FMW:[id number] DOB: 17-Jul-1955 DOA: 01/29/2024 PCP: Benjamine Aland, MD    Brief Narrative:  69 year old with history of pulmonary embolism on Eliquis , type 2 diabetes, hypertension, chronic anemia, chronic diastolic dysfunction, GERD, peripheral artery disease, hyperlipidemia brought from home with about 4 days of lethargic, less responsiveness, minimum interaction and laying in bed all the time.  Also reported she might have fallen from bed once or twice.  In the emergency room blood pressures were stable.  Heart rate was stable.  Patient was on room air.  Normotensive.  Normothermic. Sodium 158, potassium 3.3, serum CO2 was 15, creatinine 1.77.  Mildly elevated transaminases.  Lactic acid was 7.2-6.5-7.4. Patient was resuscitated, blood cultures were drawn.  Patient was started on broad-spectrum antibiotics and IV fluids.  A subsequent MRI showed acute stroke.  Patient was admitted to ICU.  Seen by PCCM and neurology.  Of note, patient was recently admitted to the hospital 1/13-1/17 2026 with leg swelling and shortness of breath.  She was given transfusion.  Esophagogram with severe dysmotility disorder.  Treated with diuretics, blood transfusions, Eliquis  was resumed and was discharged home.  1/31: Patient developed rapid A-fib with low blood pressures.  Mostly asymptomatic.  Unable to tolerate beta-blockers or Cardizem.  Started on amiodarone  drip and converted to oral amiodarone .  2/3: goal of care discussions-decided to continue supportive care-SNF placement.  Subjective:  Patient seen and examined.  Looks comfortable.  Pleasant and interactive.  I told her she is going to rehab and she agrees.  She tells me she is eating but whole tray is at the bedside untouched.   Assessment & Plan:   Acute metabolic encephalopathy: likely related to severe electrolyte abnormalities, respiratory alkalosis, Mental status is improving.  She does have some delayed  response and this is likely her baseline.  Lactic acidosis, septic shock present on admission: Lactic acid elevated as high as 7.4.   No definite source of infection.  Blood cultures and urine cultures negative so far.  Was on different antibiotics.  Discontinued. CT scan abdomen pelvis on arrival was without any acute findings.   CT scan right knee without any acute findings.   Encourage oral nutrition.  Electrolyte abnormalities, acute kidney injury: Patient on multiple GDMT, torsemide  at home.  Holding all antihypertensives. Potassium, persistently low.  On scheduled replacement. Magnesium  replaced.  Adequate today. Continue to monitor levels including magnesium  and phosphorus.  New onset A-fib with MCM:[id number] with normal EF, grade 1 diastolic dysfunction. TSH normal.  6.02. Replacing potassium, phosphorus. Treated with amiodarone  infusion.  Now on amnio loading dose followed by maintenance dose.   Appreciate cardiology input Therapeutic on Eliquis .  Severe dehydration, hyperchloremic hypernatremia: Patient with severe esophageal dysmotility.  Presented with free water deficits.  Improved with dextrose  half-normal saline.  Off IV fluids. Patient has severe motility disorder of esophagus.  Encouraging nutrition and supportive care.  No artificial feeding tube. Started on Remeron  15 mg at night.  Metabolic acidosis with respiratory alkalosis: Primary cause unknown.  Repeat VBG is stable.  Salicylate level is normal.  Continue to monitor lactic acid until normalized.  Metformin  was recently discontinued.  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  Difficult to assess focal deficits. Clinical presentation, confusion CT scan of the head, chronic atrophy and chronic ischemic changes MRI of the brain, acute lacunar infarct on the right side. TSH 6 LDL 30 A1c 4.6 Patient with severe dysphagia and  poor oral intake.  Does not suggest to  be on any statins.  Patient does not need diabetic treatment.  Metformin  discontinued.  Work with PT OT to find next destination for therapies. Has history of PE.  On Eliquis .  Not sure she was consistently taking it. Back on Eliquis .  Discontinue aspirin .  History of PE: Patient was diagnosed with pulmonary embolism on 08/28/2023.  She has been on Eliquis  since then.  Patient has poor mobility and had significant thrombus load.  Will continue Eliquis  .  Type 2 diabetes: A1c 4.6.  Does not need any treatment.  Avoid hypoglycemia.  Encourage oral sugar intake.  Social: Lives at home with son who is disabled and other grandkids. Currently needing a lot of support.  Recommending SNF. Plan to transition to a SNF.  Palliative care to follow-up at a SNF. See goals of care discussion 2/3.  Currently DNR/DNI.  If patient further decompensates, plan is to serve with hospice care.    DVT prophylaxis: SCDs Start: 01/30/24 1458 SCDs Start: 01/30/24 0734 Place TED hose Start: 01/30/24 0734 apixaban  (ELIQUIS ) tablet 5 mg   Code Status: DNR/DNI.  Palliative care following. Family Communication: None today. Disposition Plan: Status is: Inpatient Remains inpatient appropriate because: Stable to transition to a SNF.   Consultants:  Neurology  Critical care  Palliative care   Procedures:  None   Antimicrobials:  Vancomycin  01/30/24----  1/31 Zosyn  1/29-----2/1     Objective: Vitals:   02/06/24 0022 02/06/24 0441 02/06/24 0500 02/06/24 0759  BP: 111/75 113/74  121/71  Pulse: 90 91  93  Resp: 15 15    Temp: 99 F (37.2 C) 98.2 F (36.8 C)  98.9 F (37.2 C)  TempSrc:      SpO2: 100% 100%  100%  Weight:   70.9 kg   Height:       No intake or output data in the 24 hours ending 02/06/24 1103  Filed Weights   02/04/24 0500 02/05/24 0439 02/06/24 0500  Weight: 69 kg 71.6 kg 70.9 kg    Examination:  General: Fairly comfortable today.  She has flat affect but communicates  appropriately. Cardiovascular: S1-S2 normal.  Regular rate rhythm.  No murmur. Respiratory: Bilateral clear.  No added sounds. Gastrointestinal: Soft.  Nontender.  Bowel sounds present. Ext: Patient has chronic nonpitting edema both knees,     Data Reviewed: I have personally reviewed following labs and imaging studies  CBC: Recent Labs  Lab 01/30/24 1440 01/30/24 1701 01/31/24 0410 02/01/24 1033 02/02/24 0733 02/03/24 0604 02/04/24 0203  WBC 10.6*   < > 10.9* 6.3 5.9 5.8 6.1  NEUTROABS 9.1*  --   --   --   --   --   --   HGB 9.2*   < > 9.1* 9.5* 10.3* 9.7* 9.9*  HCT 27.2*   < > 27.0* 29.1* 31.9* 28.1* 28.5*  MCV 99.6   < > 99.3 102.5* 104.9* 97.2 96.6  PLT 127*   < > 130* 101* 118* 128* 151   < > = values in this interval not displayed.   Basic Metabolic Panel: Recent Labs  Lab 01/31/24 0410 02/01/24 1033 02/02/24 0733 02/03/24 0604 02/04/24 0203  NA 152* 144 142 141 143  K 3.7 3.0* 2.7* 3.0* 3.8  CL 117* 116* 111 112* 115*  CO2 20* 16* 14* 15* 18*  GLUCOSE 149* 153* 93 84 96  BUN 38* 26* 18 13 12   CREATININE 1.49* 1.25* 1.11* 0.92 0.82  CALCIUM   6.5* 6.6* 6.5* 6.6* 7.0*  MG 1.7 1.8  --  1.4* 2.0  PHOS 2.2* 2.2*  --  1.9*  --    GFR: Estimated Creatinine Clearance: 62.6 mL/min (by C-G formula based on SCr of 0.82 mg/dL). Liver Function Tests: Recent Labs  Lab 01/30/24 1439 01/31/24 0410 02/03/24 0604  AST 212* 118* 36  ALT 216* 181* 103*  ALKPHOS 86 86 94  BILITOT 1.1 0.8 0.5  PROT 4.6* 4.5* 4.5*  ALBUMIN 2.4* 2.3* 2.3*   No results for input(s): LIPASE, AMYLASE in the last 168 hours. No results for input(s): AMMONIA in the last 168 hours. Coagulation Profile: Recent Labs  Lab 01/30/24 1701  INR 1.3*   Cardiac Enzymes: No results for input(s): CKTOTAL, CKMB, CKMBINDEX, TROPONINI in the last 168 hours.  BNP (last 3 results) Recent Labs    01/14/24 1327 01/30/24 0010  PROBNP 1,157.0* 4,016.0*   HbA1C: No results for input(s):  HGBA1C in the last 72 hours.  CBG: Recent Labs  Lab 02/05/24 0444 02/05/24 0908 02/05/24 1239 02/05/24 1620 02/06/24 0027  GLUCAP 75 80 94 76 73   Lipid Profile: No results for input(s): CHOL, HDL, LDLCALC, TRIG, CHOLHDL, LDLDIRECT in the last 72 hours.  Thyroid  Function Tests: No results for input(s): TSH, T4TOTAL, FREET4, T3FREE, THYROIDAB in the last 72 hours.  Anemia Panel: No results for input(s): VITAMINB12, FOLATE, FERRITIN, TIBC, IRON, RETICCTPCT in the last 72 hours. Sepsis Labs: Recent Labs  Lab 01/30/24 2059 01/31/24 1202 01/31/24 1530 02/01/24 1438  LATICACIDVEN 3.9* 2.6* 3.1* 3.8*    Recent Results (from the past 240 hours)  Resp panel by RT-PCR (RSV, Flu A&B, Covid) Anterior Nasal Swab     Status: None   Collection Time: 01/29/24 10:55 PM   Specimen: Anterior Nasal Swab  Result Value Ref Range Status   SARS Coronavirus 2 by RT PCR NEGATIVE NEGATIVE Final   Influenza A by PCR NEGATIVE NEGATIVE Final   Influenza B by PCR NEGATIVE NEGATIVE Final    Comment: (NOTE) The Xpert Xpress SARS-CoV-2/FLU/RSV plus assay is intended as an aid in the diagnosis of influenza from Nasopharyngeal swab specimens and should not be used as a sole basis for treatment. Nasal washings and aspirates are unacceptable for Xpert Xpress SARS-CoV-2/FLU/RSV testing.  Fact Sheet for Patients: bloggercourse.com  Fact Sheet for Healthcare Providers: seriousbroker.it  This test is not yet approved or cleared by the [place]  FDA and has been authorized for detection and/or diagnosis of SARS-CoV-2 by FDA under an Emergency Use Authorization (EUA). This EUA will remain in effect (meaning this test can be used) for the duration of the COVID-19 declaration under Section 564(b)(1) of the Act, 21 U.S.C. section 360bbb-3(b)(1), unless the authorization is terminated or revoked.     Resp Syncytial  Virus by PCR NEGATIVE NEGATIVE Final    Comment: (NOTE) Fact Sheet for Patients: bloggercourse.com  Fact Sheet for Healthcare Providers: seriousbroker.it  This test is not yet approved or cleared by the [place]  FDA and has been authorized for detection and/or diagnosis of SARS-CoV-2 by FDA under an Emergency Use Authorization (EUA). This EUA will remain in effect (meaning this test can be used) for the duration of the COVID-19 declaration under Section 564(b)(1) of the Act, 21 U.S.C. section 360bbb-3(b)(1), unless the authorization is terminated or revoked.  Performed at [facility], [address], [place], [place] [address]   Blood Culture (routine x 2)     Status: None   Collection Time: 01/30/24  12:09 AM   Specimen: BLOOD RIGHT ARM  Result Value Ref Range Status   Specimen Description BLOOD RIGHT ARM  Final   Special Requests   Final    BOTTLES DRAWN AEROBIC AND ANAEROBIC Blood Culture results may not be optimal due to an inadequate volume of blood received in culture bottles   Culture   Final    NO GROWTH 5 DAYS Performed at [facility], [address], [place], [place] [address]    Report Status 02/04/2024 FINAL  Final  Blood Culture (routine x 2)     Status: None   Collection Time: 01/30/24 12:12 AM   Specimen: BLOOD RIGHT ARM  Result Value Ref Range Status   Specimen Description BLOOD RIGHT ARM  Final   Special Requests NONE  Final   Culture   Final    TEST WILL BE CREDITED Performed at [facility], [address] [place], [place] [address]    Report Status 02/04/2024 FINAL  Final  MRSA Next Gen by PCR, Nasal     Status: Abnormal   Collection Time: 01/30/24  3:00 PM   Specimen: Nasal Mucosa; Nasal Swab  Result Value Ref Range Status   MRSA by PCR Next Gen DETECTED (A) NOT DETECTED Final    Comment: RESULT CALLED TO, READ BACK BY AND VERIFIED WITH: RN Harlene BIRCH on [phone] @1919  by  SM (NOTE) The GeneXpert MRSA Assay (FDA approved for NASAL specimens only), is one component of a comprehensive MRSA colonization surveillance program. It is not intended to diagnose MRSA infection nor to guide or monitor treatment for MRSA infections. Test performance is not FDA approved in patients less than 77 years old. Performed at [facility], [address], [place], [place] [address]          Radiology Studies: No results found.       Scheduled Meds:  amiodarone   200 mg Oral BID   Followed by   NOREEN ON 02/16/2024] amiodarone   200 mg Oral Daily   apixaban   5 mg Oral BID   Chlorhexidine  Gluconate Cloth  6 each Topical Daily   feeding supplement  237 mL Oral BID BM   mirtazapine   15 mg Oral QHS   mouth rinse  15 mL Mouth Rinse 4 times per day   sodium chloride  flush  3 mL Intravenous Q12H   sodium chloride  flush  3 mL Intravenous Q12H   Continuous Infusions:     LOS: 7 days       Renato Applebaum, MD [facility]   "

## 2024-02-07 LAB — GLUCOSE, CAPILLARY: Glucose-Capillary: 73 mg/dL (ref 70–99)

## 2024-02-07 MED ORDER — LOPERAMIDE HCL 1 MG/7.5ML PO SUSP: 2.0000 mg | ORAL | Status: DC | PRN

## 2024-02-07 MED ORDER — LOPERAMIDE HCL 2 MG PO CAPS
2.0000 mg | ORAL_CAPSULE | ORAL | Status: AC | PRN
  Administered 2024-02-07: 2 mg via ORAL
  Filled 2024-02-07: qty 1

## 2024-02-07 NOTE — Progress Notes (Signed)
 Speech Language Pathology Treatment: Cognitive-Linguistic  Patient Details Name: Angie Carr MRN: [id number] DOB: November 09, 1955 Today's Date: 02/07/2024 Time: 8844-8789 SLP Time Calculation (min) (ACUTE ONLY): 15 min  Assessment / Plan / Recommendation Clinical Impression  PLAN: cognitive therapy - ID details of condition, make choices about care.  Pt seen at bedside for skilled ST intervention targeting above goals. Pt was awake and alert. Lunch tray at bedside, however, pt refused to accept presentations. Pt is oriented to self, time, and place. She is unable to verbalize reason for hospitalization. Pt reports she lives with her son and grandchild. She frequently responds to questions with I don't know. She seems tearful, but indicates she is not in pain, and is not sad. Speech is fully intelligible. Pt was able to indicate she wanted sink light off when SLP was leaving the room, making choices about her care. ST will continue to follow. Recommend continued ST intervention at next venue of care.   HPI HPI: 69yo admitted from home 01/29/24 with AMS/lethargy 2/2 sepsis. MRI: Acute right lacunar cerebellar infarct, diffuse cerebral atrophy. Afib/rvr 02/01/24. SLE 02/03/24: 6/30 SLUMS. PMH: acute on chronic heart failure, CHF, DM2, HTN, PVD, PE, GERD, anxiety, asthma, HTN, neuromuscular disorder, COVID, DJD, esophageal stenosis.      SLP Plan  Continue with current plan of care         Recommendations  Close supervision at DC, cont'd ST at next venue of care.        Oral care BID   Frequent or constant Supervision/Assistance Cognitive communication deficit (R41.841)     Continue with current plan of care    Hugh Kamara B. Dory, MSP, CCC-SLP Speech Language Pathologist Office: [phone]  Dory Caprice Daring 02/07/2024, 12:13 PM

## 2024-02-07 NOTE — Plan of Care (Signed)
  Problem: Fluid Volume: Goal: Hemodynamic stability will improve Outcome: Progressing   Problem: Clinical Measurements: Goal: Diagnostic test results will improve Outcome: Progressing Goal: Signs and symptoms of infection will decrease Outcome: Progressing   Problem: Respiratory: Goal: Ability to maintain adequate ventilation will improve Outcome: Progressing   Problem: Education: Goal: Ability to describe self-care measures that may prevent or decrease complications (Diabetes Survival Skills Education) will improve Outcome: Progressing Goal: Individualized Educational Video(s) Outcome: Progressing   Problem: Coping: Goal: Ability to adjust to condition or change in health will improve Outcome: Progressing   Problem: Fluid Volume: Goal: Ability to maintain a balanced intake and output will improve Outcome: Progressing   Problem: Health Behavior/Discharge Planning: Goal: Ability to identify and utilize available resources and services will improve Outcome: Progressing Goal: Ability to manage health-related needs will improve Outcome: Progressing   Problem: Metabolic: Goal: Ability to maintain appropriate glucose levels will improve Outcome: Progressing   Problem: Nutritional: Goal: Maintenance of adequate nutrition will improve Outcome: Progressing Goal: Progress toward achieving an optimal weight will improve Outcome: Progressing   Problem: Skin Integrity: Goal: Risk for impaired skin integrity will decrease Outcome: Progressing   Problem: Tissue Perfusion: Goal: Adequacy of tissue perfusion will improve Outcome: Progressing   Problem: Education: Goal: Knowledge of General Education information will improve Description: Including pain rating scale, medication(s)/side effects and non-pharmacologic comfort measures Outcome: Progressing   Problem: Health Behavior/Discharge Planning: Goal: Ability to manage health-related needs will improve Outcome:  Progressing   Problem: Clinical Measurements: Goal: Ability to maintain clinical measurements within normal limits will improve Outcome: Progressing Goal: Will remain free from infection Outcome: Progressing Goal: Diagnostic test results will improve Outcome: Progressing Goal: Respiratory complications will improve Outcome: Progressing Goal: Cardiovascular complication will be avoided Outcome: Progressing   Problem: Activity: Goal: Risk for activity intolerance will decrease Outcome: Progressing   Problem: Nutrition: Goal: Adequate nutrition will be maintained Outcome: Progressing   Problem: Coping: Goal: Level of anxiety will decrease Outcome: Progressing   Problem: Elimination: Goal: Will not experience complications related to bowel motility Outcome: Progressing Goal: Will not experience complications related to urinary retention Outcome: Progressing   Problem: Pain Managment: Goal: General experience of comfort will improve and/or be controlled Outcome: Progressing   Problem: Safety: Goal: Ability to remain free from injury will improve Outcome: Progressing   Problem: Skin Integrity: Goal: Risk for impaired skin integrity will decrease Outcome: Progressing   Problem: Education: Goal: Knowledge of disease or condition will improve Outcome: Progressing Goal: Knowledge of secondary prevention will improve (MUST DOCUMENT ALL) Outcome: Progressing Goal: Knowledge of patient specific risk factors will improve (DELETE if not current risk factor) Outcome: Progressing   Problem: Ischemic Stroke/TIA Tissue Perfusion: Goal: Complications of ischemic stroke/TIA will be minimized Outcome: Progressing   Problem: Coping: Goal: Will verbalize positive feelings about self Outcome: Progressing Goal: Will identify appropriate support needs Outcome: Progressing   Problem: Health Behavior/Discharge Planning: Goal: Ability to manage health-related needs will  improve Outcome: Progressing Goal: Goals will be collaboratively established with patient/family Outcome: Progressing   Problem: Self-Care: Goal: Ability to participate in self-care as condition permits will improve Outcome: Progressing Goal: Verbalization of feelings and concerns over difficulty with self-care will improve Outcome: Progressing Goal: Ability to communicate needs accurately will improve Outcome: Progressing   Problem: Nutrition: Goal: Risk of aspiration will decrease Outcome: Progressing Goal: Dietary intake will improve Outcome: Progressing

## 2024-02-07 NOTE — Progress Notes (Signed)
 Physical Therapy Treatment Patient Details Name: Angie Carr MRN: [id number] DOB: [birth year] Today's Date: 02/07/2024   History of Present Illness Pt is 69 year old presented to [facility] on  01/29/24 for AMS/lethargy. 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 Pt developed afib with rvr on 02/01/24. PMH - CHF, DM2 , HTN, PVD, PE , anxiety, asthma,  HTN, neuromuscular disorder, COVID, DJD, esophageal stenosis    PT Comments  Pt lethargic likely secondary to pain medication. Unable to trial stedy d/t limited B knee flexion. Pt continues to demonstrate a posterior lean while sitting EOB requiring mod-maxA to maintain upright neutral posture. She progressed OOB mobility, transferring to drop-arm recliner chair via a squat pivot. Pt required mod-maxA x2 and use of bed pad to transition into chair from an elevated surface. Maximove lift pad left underneath pt. Pt will benefit from continued inpatient follow up therapy, <3 hours/day.    If plan is discharge home, recommend the following: Two people to help with walking and/or transfers;Two people to help with bathing/dressing/bathroom;Assist for transportation;Assistance with cooking/housework   Can travel by private vehicle     No  Equipment Recommendations  Wheelchair (measurements PT);Wheelchair cushion (measurements PT);Hoyer lift    Recommendations for Other Services       Precautions / Restrictions Precautions Precautions: Fall Recall of Precautions/Restrictions: Impaired Restrictions Weight Bearing Restrictions Per Provider Order: No     Mobility  Bed Mobility Overal bed mobility: Needs Assistance Bed Mobility: Rolling, Sidelying to Sit Rolling: Max assist Sidelying to sit: Max assist, HOB elevated, Used rails       General bed mobility comments: Pt sat up on L side of bed with increased time. Cued pt to place RLE into knee flex,  limited active range achieved. Use of bed pad to rotate pt onto left side. Assist to bring BLE off EOB and elevate trunk.    Transfers Overall transfer level: Needs assistance Equipment used: 2 person hand held assist Transfers: Bed to chair/wheelchair/BSC       Squat pivot transfers: Mod assist, +2 physical assistance, From elevated surface     General transfer comment: Pt transferred to drop-arm recliner chair on her left positioned touching bed. Instructed pt to hold onto posterior upper arm of PT/Therapy tech. Used bed pad to help pt power up and turn to recliner chair. Anterior knee block. Repositioned in chair.    Ambulation/Gait               General Gait Details: Unable at this time.   Stairs             Wheelchair Mobility     Tilt Bed    Modified Rankin (Stroke Patients Only)       Balance Overall balance assessment: Needs assistance Sitting-balance support: Bilateral upper extremity supported, Feet supported Sitting balance-Leahy Scale: Poor Sitting balance - Comments: Sat EOB with mod-maxA to maintain stability d/t posterior lean. Brief moments decreasing to minA. Postural control: Posterior lean                                  Communication Communication Communication: Impaired Factors Affecting Communication: Reduced clarity of speech  Cognition Arousal: Lethargic, Suspect due to medications Behavior During Therapy: Flat affect   PT - Cognitive impairments: Awareness, Problem solving, Safety/Judgement, Sequencing, Initiation, Memory  PT - Cognition Comments: Pt with delayed processing. She intermittently closed her eyes throughout session. Following commands: Impaired Following commands impaired: Follows one step commands inconsistently, Follows one step commands with increased time    Cueing Cueing Techniques: Verbal cues, Tactile cues  Exercises      General Comments         Pertinent Vitals/Pain Pain Assessment Pain Assessment: No/denies pain Pain Intervention(s): Premedicated before session, Monitored during session, Limited activity within patient's tolerance, Repositioned    Home Living                          Prior Function            PT Goals (current goals can now be found in the care plan section) Acute Rehab PT Goals Patient Stated Goal: Move better PT Goal Formulation: Patient unable to participate in goal setting Time For Goal Achievement: 02/15/24 Potential to Achieve Goals: Fair Progress towards PT goals: Progressing toward goals    Frequency    Min 2X/week      PT Plan      Co-evaluation              AM-PAC PT 6 Clicks Mobility   Outcome Measure  Help needed turning from your back to your side while in a flat bed without using bedrails?: A Lot Help needed moving from lying on your back to sitting on the side of a flat bed without using bedrails?: A Lot Help needed moving to and from a bed to a chair (including a wheelchair)?: Total Help needed standing up from a chair using your arms (e.g., wheelchair or bedside chair)?: Total Help needed to walk in hospital room?: Total Help needed climbing 3-5 steps with a railing? : Total 6 Click Score: 8    End of Session Equipment Utilized During Treatment: Gait belt Activity Tolerance: Patient tolerated treatment well;Patient limited by lethargy Patient left: in chair;with call bell/phone within reach;with chair alarm set;with family/visitor present Nurse Communication: Mobility status;Need for lift equipment (maximove) PT Visit Diagnosis: Other abnormalities of gait and mobility (R26.89);Muscle weakness (generalized) (M62.81);Pain Pain - Right/Left: Right Pain - part of body: Hip;Knee     Time: 1429-1450 PT Time Calculation (min) (ACUTE ONLY): 21 min  Charges:    $Therapeutic Activity: 8-22 mins PT General Charges $$ ACUTE PT VISIT: 1 Visit                      Randall SAUNDERS, PT, DPT Acute Rehabilitation Services Office: [phone] Secure Chat Preferred  Delon CHRISTELLA Callander 02/07/2024, 3:21 PM

## 2024-02-07 NOTE — TOC CAGE-AID Note (Signed)
 Transition of Care [facility]) - CAGE-AID Screening   Patient Details  Name: Angie Carr MRN: [id number] Date of Birth: 20-Jul-1955  Transition of Care [facility]) CM/SW Contact:    Inocente GORMAN Kindle, LCSW Phone Number: 02/07/2024, 9:49 AM   Clinical Narrative: Patient is disoriented and unable to participate in screening.    CAGE-AID Screening: Substance Abuse Screening unable to be completed due to: : Patient unable to participate

## 2024-02-07 NOTE — Progress Notes (Signed)
 " PROGRESS NOTE    Angie Carr  FMW:[id number] DOB: [birth year] DOA: 01/29/2024 PCP: Angie Aland, MD    Brief Narrative:  69 year old with history of pulmonary embolism on Eliquis , type 2 diabetes, hypertension, chronic anemia, chronic diastolic dysfunction, GERD, peripheral artery disease, hyperlipidemia brought from home with about 4 days of lethargic, less responsiveness, minimum interaction and laying in bed all the time.  Also reported she might have fallen from bed once or twice.  In the emergency room blood pressures were stable.  Heart rate was stable.  Patient was on room air.  Normotensive.  Normothermic. Sodium 158, potassium 3.3, serum CO2 was 15, creatinine 1.77.  Mildly elevated transaminases.  Lactic acid was 7.2-6.5-7.4. Patient was resuscitated, blood cultures were drawn.  Patient was started on broad-spectrum antibiotics and IV fluids.  A subsequent MRI showed acute stroke.  Patient was admitted to ICU.  Seen by PCCM and neurology.  Of note, patient was recently admitted to the hospital 1/13-1/17 2026 with leg swelling and shortness of breath.  She was given transfusion.  Esophagogram with severe dysmotility disorder.  Treated with diuretics, blood transfusions, Eliquis  was resumed and was discharged home.  1/31: Patient developed rapid A-fib with low blood pressures.  Mostly asymptomatic.  Unable to tolerate beta-blockers or Cardizem.  Started on amiodarone  drip and converted to oral amiodarone .  2/3: goal of care discussions-decided to continue supportive care-SNF placement.  Subjective:  Patient seen and examined.  She responded to the greetings.  She did not remove cover from her face.  She tells me that she is doing well and wants to rest.  The food is untouched at the bedside.  Patient tells me that Tylenol  was helpful.   Assessment & Plan:   Acute metabolic encephalopathy: likely related to severe electrolyte abnormalities, respiratory alkalosis, Mental status  is improving.  She does have some delayed response and this is likely her baseline.  Lactic acidosis, septic shock present on admission: Lactic acid elevated as high as 7.4.   No definite source of infection.  Blood cultures and urine cultures negative so far.  Was on different antibiotics.  Discontinued. CT scan abdomen pelvis on arrival was without any acute findings.   CT scan right knee without any acute findings.   Encourage oral nutrition, however very challenging.  Electrolyte abnormalities, acute kidney injury: Patient on multiple GDMT, torsemide  at home.  Holding all antihypertensives. Potassium, persistently low.  On scheduled replacement. Magnesium  replaced.  Adequate today. Continue to monitor intermittently.  New onset A-fib with MCM:Zrynrjmipnhmjf with normal EF, grade 1 diastolic dysfunction. TSH normal.  6.02. Replacing potassium, phosphorus. Treated with amiodarone  infusion.  Now on amnio loading dose followed by maintenance dose.   Appreciate cardiology input Therapeutic on Eliquis .  Severe dehydration, hyperchloremic hypernatremia: Patient with severe esophageal dysmotility.  Presented with free water deficits.  Improved with dextrose  half-normal saline.  Off IV fluids. Patient has severe motility disorder of esophagus.  Encouraging nutrition and supportive care.  No artificial feeding tube. Started on Remeron  15 mg at night.  Tolerating well.  Metabolic acidosis with respiratory alkalosis: Primary cause unknown.  Repeat VBG is stable.  Salicylate level is normal.  Continue to monitor lactic acid until normalized.  Metformin  was recently discontinued.  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  Difficult to assess focal deficits. Clinical presentation, confusion CT scan of the head, chronic atrophy and chronic ischemic changes MRI of the brain, acute lacunar infarct on the right  side. TSH 6 LDL 30 A1c 4.6 Patient with  severe dysphagia and poor oral intake.  Does not suggest to be on any statins.  Patient does not need diabetic treatment.  Metformin  discontinued.  Work with PT OT to find next destination for therapies. Has history of PE.  On Eliquis .  Not sure she was consistently taking it. Back on Eliquis .  Discontinue aspirin .  History of PE: Patient was diagnosed with pulmonary embolism on 08/28/2023.  She has been on Eliquis  since then.  Patient has poor mobility and had significant thrombus load.  Will continue Eliquis  .  Type 2 diabetes: A1c 4.6.  Does not need any treatment.  Avoid hypoglycemia.  Encourage oral sugar intake.  Social: Lives at home with son who is disabled and other grandkids. Currently needing a lot of support.  Recommending SNF. Plan to transition to a SNF.  Palliative care to follow-up at a SNF. See goals of care discussion 2/3.  Currently DNR/DNI.  If patient further decompensates, plan is to serve with hospice care.  2/6, communication with patient's niece who is primary decision-maker for the patient.  Patient does have bed at [facility] for a SNF level of care and will be available only Monday. We discussed that if patient has any decompensation in the meantime, do not escalate care but start comfort care measures after talking to the family and possibly look for hospice placement. Plan is to transfer to SNF for attempting rehab-if patient has to be readmitted with serious illness-plan is to serve with comfort care measures.    DVT prophylaxis: SCDs Start: 01/30/24 1458 SCDs Start: 01/30/24 0734 Place TED hose Start: 01/30/24 0734 apixaban  (ELIQUIS ) tablet 5 mg   Code Status: DNR/DNI.  Palliative care following. Family Communication: [name] was called and updated. Disposition Plan: Status is: Inpatient Remains inpatient appropriate because: Stable to transition to SNF.   Consultants:  Neurology  Critical care  Palliative care   Procedures:  None    Antimicrobials:  Vancomycin  01/30/24----  1/31 Zosyn  1/29-----2/1     Objective: Vitals:   02/07/24 0158 02/07/24 0500 02/07/24 0516 02/07/24 0806  BP: 117/78  126/73 119/78  Pulse: 92  92 90  Resp: 19  20   Temp: 98.5 F (36.9 C)  (!) 97.3 F (36.3 C) 97.6 F (36.4 C)  TempSrc:      SpO2: 100%  100% 100%  Weight:  66.9 kg    Height:        Intake/Output Summary (Last 24 hours) at 02/07/2024 1115 Last data filed at 02/07/2024 0830 Gross per 24 hour  Intake --  Output 750 ml  Net -750 ml    Filed Weights   02/05/24 0439 02/06/24 0500 02/07/24 0500  Weight: 71.6 kg 70.9 kg 66.9 kg    Examination:  General: Fairly comfortable today.  Answers appropriately.  Not keen to conversation and interview. Patient is alert and awake.  She is oriented to herself and family.  Flat affect. Cardiovascular: S1-S2 normal.  Regular rate rhythm.  No murmur. Respiratory: Bilateral clear.  No added sounds. Gastrointestinal: Soft.  Nontender.  Bowel sounds present. Ext: Patient has chronic nonpitting edema both knees, gross generalized weakness.   Data Reviewed: I have personally reviewed following labs and imaging studies  CBC: Recent Labs  Lab 02/01/24 1033 02/02/24 0733 02/03/24 0604 02/04/24 0203  WBC 6.3 5.9 5.8 6.1  HGB 9.5* 10.3* 9.7* 9.9*  HCT 29.1* 31.9* 28.1* 28.5*  MCV 102.5* 104.9* 97.2 96.6  PLT  101* 118* 128* 151   Basic Metabolic Panel: Recent Labs  Lab 02/01/24 1033 02/02/24 0733 02/03/24 0604 02/04/24 0203  NA 144 142 141 143  K 3.0* 2.7* 3.0* 3.8  CL 116* 111 112* 115*  CO2 16* 14* 15* 18*  GLUCOSE 153* 93 84 96  BUN 26* 18 13 12   CREATININE 1.25* 1.11* 0.92 0.82  CALCIUM  6.6* 6.5* 6.6* 7.0*  MG 1.8  --  1.4* 2.0  PHOS 2.2*  --  1.9*  --    GFR: Estimated Creatinine Clearance: 60.9 mL/min (by C-G formula based on SCr of 0.82 mg/dL). Liver Function Tests: Recent Labs  Lab 02/03/24 0604  AST 36  ALT 103*  ALKPHOS 94  BILITOT 0.5  PROT  4.5*  ALBUMIN 2.3*   No results for input(s): LIPASE, AMYLASE in the last 168 hours. No results for input(s): AMMONIA in the last 168 hours. Coagulation Profile: No results for input(s): INR, PROTIME in the last 168 hours.  Cardiac Enzymes: No results for input(s): CKTOTAL, CKMB, CKMBINDEX, TROPONINI in the last 168 hours.  BNP (last 3 results) Recent Labs    01/14/24 1327 01/30/24 0010  PROBNP 1,157.0* 4,016.0*   HbA1C: No results for input(s): HGBA1C in the last 72 hours.  CBG: Recent Labs  Lab 02/05/24 0908 02/05/24 1239 02/05/24 1620 02/06/24 0027 02/07/24 0807  GLUCAP 80 94 76 73 73   Lipid Profile: No results for input(s): CHOL, HDL, LDLCALC, TRIG, CHOLHDL, LDLDIRECT in the last 72 hours.  Thyroid  Function Tests: No results for input(s): TSH, T4TOTAL, FREET4, T3FREE, THYROIDAB in the last 72 hours.  Anemia Panel: No results for input(s): VITAMINB12, FOLATE, FERRITIN, TIBC, IRON, RETICCTPCT in the last 72 hours. Sepsis Labs: Recent Labs  Lab 01/31/24 1202 01/31/24 1530 02/01/24 1438  LATICACIDVEN 2.6* 3.1* 3.8*    Recent Results (from the past 240 hours)  Resp panel by RT-PCR (RSV, Flu A&B, Covid) Anterior Nasal Swab     Status: None   Collection Time: 01/29/24 10:55 PM   Specimen: Anterior Nasal Swab  Result Value Ref Range Status   SARS Coronavirus 2 by RT PCR NEGATIVE NEGATIVE Final   Influenza A by PCR NEGATIVE NEGATIVE Final   Influenza B by PCR NEGATIVE NEGATIVE Final    Comment: (NOTE) The Xpert Xpress SARS-CoV-2/FLU/RSV plus assay is intended as an aid in the diagnosis of influenza from Nasopharyngeal swab specimens and should not be used as a sole basis for treatment. Nasal washings and aspirates are unacceptable for Xpert Xpress SARS-CoV-2/FLU/RSV testing.  Fact Sheet for Patients: bloggercourse.com  Fact Sheet for Healthcare  Providers: seriousbroker.it  This test is not yet approved or cleared by the [place]  FDA and has been authorized for detection and/or diagnosis of SARS-CoV-2 by FDA under an Emergency Use Authorization (EUA). This EUA will remain in effect (meaning this test can be used) for the duration of the COVID-19 declaration under Section 564(b)(1) of the Act, 21 U.S.C. section 360bbb-3(b)(1), unless the authorization is terminated or revoked.     Resp Syncytial Virus by PCR NEGATIVE NEGATIVE Final    Comment: (NOTE) Fact Sheet for Patients: bloggercourse.com  Fact Sheet for Healthcare Providers: seriousbroker.it  This test is not yet approved or cleared by the [place]  FDA and has been authorized for detection and/or diagnosis of SARS-CoV-2 by FDA under an Emergency Use Authorization (EUA). This EUA will remain in effect (meaning this test can be used) for the duration of the COVID-19 declaration under Section 564(b)(1) of the  Act, 21 U.S.C. section 360bbb-3(b)(1), unless the authorization is terminated or revoked.  Performed at [facility], [address], [place], [place] [address]   Blood Culture (routine x 2)     Status: None   Collection Time: 01/30/24 12:09 AM   Specimen: BLOOD RIGHT ARM  Result Value Ref Range Status   Specimen Description BLOOD RIGHT ARM  Final   Special Requests   Final    BOTTLES DRAWN AEROBIC AND ANAEROBIC Blood Culture results may not be optimal due to an inadequate volume of blood received in culture bottles   Culture   Final    NO GROWTH 5 DAYS Performed at [facility], [address], [place], [place] [address]    Report Status 02/04/2024 FINAL  Final  Blood Culture (routine x 2)     Status: None   Collection Time: 01/30/24 12:12 AM   Specimen: BLOOD RIGHT ARM  Result Value Ref Range Status   Specimen Description BLOOD RIGHT ARM  Final   Special  Requests NONE  Final   Culture   Final    TEST WILL BE CREDITED Performed at [facility], [address], [place], [place] [address]    Report Status 02/04/2024 FINAL  Final  MRSA Next Gen by PCR, Nasal     Status: Abnormal   Collection Time: 01/30/24  3:00 PM   Specimen: Nasal Mucosa; Nasal Swab  Result Value Ref Range Status   MRSA by PCR Next Gen DETECTED (A) NOT DETECTED Final    Comment: RESULT CALLED TO, READ BACK BY AND VERIFIED WITH: RN Harlene BIRCH on [phone] @1919  by SM (NOTE) The GeneXpert MRSA Assay (FDA approved for NASAL specimens only), is one component of a comprehensive MRSA colonization surveillance program. It is not intended to diagnose MRSA infection nor to guide or monitor treatment for MRSA infections. Test performance is not FDA approved in patients less than 73 years old. Performed at [facility], [address] [place], [place] [address]          Radiology Studies: No results found.       Scheduled Meds:  acetaminophen   650 mg Oral TID   amiodarone   200 mg Oral BID   Followed by   NOREEN ON 02/16/2024] amiodarone   200 mg Oral Daily   apixaban   5 mg Oral BID   feeding supplement  237 mL Oral BID BM   mirtazapine   15 mg Oral QHS   mouth rinse  15 mL Mouth Rinse 4 times per day   sodium chloride  flush  3 mL Intravenous Q12H   Continuous Infusions:     LOS: 8 days       Renato Applebaum, MD [facility]   "

## 2024-02-07 NOTE — Progress Notes (Signed)
 Nutrition Follow-up  DOCUMENTATION CODES:   Severe malnutrition in context of chronic illness  INTERVENTION:  Continue dysphagia 1 diet with thin liquids. Continue to offer Ensure Plus High Protein po BID, each supplement provides 350 kcal and 20 grams of protein Encourage intake of meals and supplements.  NUTRITION DIAGNOSIS:   Severe Malnutrition related to chronic illness as evidenced by percent weight loss, severe muscle depletion, severe fat depletion (11.3% x 3 months); ongoing.  GOAL:   Patient will meet greater than or equal to 90% of their needs; unmet.   MONITOR:   PO intake, Supplement acceptance, Labs, Diet advancement  REASON FOR ASSESSMENT:   Consult Calorie Count  ASSESSMENT:   Pt with hx of HTN, HLD, DM type 2, GERD, PAD, and CHF presented to ED with weakness and AMS. Found to have sepsis on admission.  Palliative care team is following. Patient is DNR-limited code. Plans for continuing current measures with goal of discharging to SNF Monday for rehab; however, if patient has any decompensation, will transition to comfort care.  Patient remains on a dysphagia 1 diet with thin liquids; Ensure Plus High Protein BID. She is receiving Remeron  for appetite stimulant. She is refusing most of the offered Ensures. Meal intakes are documented at 0%.  Patient/family does not want to pursue feeding tube placement.  Labs reviewed.  CBG: [phone]  Medications reviewed and include remeron .  Diet Order:   Diet Order             DIET - DYS 1 Room service appropriate? No; Fluid consistency: Thin  Diet effective now                   EDUCATION NEEDS:   Not appropriate for education at this time  Skin:  Skin Assessment: Skin Integrity Issues:  Last BM:  2/6 type 7  Height:   Ht Readings from Last 1 Encounters:  01/30/24 5' 4 (1.626 m)    Weight:   Wt Readings from Last 1 Encounters:  02/07/24 66.9 kg    Ideal Body Weight:  54.5 kg  BMI:   Body mass index is 25.32 kg/m.  Estimated Nutritional Needs:   Kcal:  1500-1800 kcal/d  Protein:  75-90g/d  Fluid:  1.8L/d   Suzen HUNT RD, LDN, CNSC Contact via secure chat. If unavailable, use group chat RD Inpatient.

## 2024-02-24 ENCOUNTER — Ambulatory Visit: Admitting: Cardiology
